# Patient Record
Sex: Male | Born: 1950
Health system: Southern US, Community
[De-identification: ages and names within clinical notes are randomized; demographics above are authoritative.]

## PROBLEM LIST (undated history)

## (undated) DIAGNOSIS — Z87442 Personal history of urinary calculi: Secondary | ICD-10-CM

## (undated) DIAGNOSIS — I219 Acute myocardial infarction, unspecified: Secondary | ICD-10-CM

## (undated) DIAGNOSIS — N400 Enlarged prostate without lower urinary tract symptoms: Secondary | ICD-10-CM

## (undated) DIAGNOSIS — I2510 Atherosclerotic heart disease of native coronary artery without angina pectoris: Secondary | ICD-10-CM

## (undated) DIAGNOSIS — I10 Essential (primary) hypertension: Secondary | ICD-10-CM

## (undated) DIAGNOSIS — M1990 Unspecified osteoarthritis, unspecified site: Secondary | ICD-10-CM

## (undated) DIAGNOSIS — I6529 Occlusion and stenosis of unspecified carotid artery: Secondary | ICD-10-CM

## (undated) DIAGNOSIS — J302 Other seasonal allergic rhinitis: Secondary | ICD-10-CM

## (undated) DIAGNOSIS — H3530 Unspecified macular degeneration: Secondary | ICD-10-CM

## (undated) DIAGNOSIS — K219 Gastro-esophageal reflux disease without esophagitis: Secondary | ICD-10-CM

## (undated) DIAGNOSIS — R0600 Dyspnea, unspecified: Secondary | ICD-10-CM

## (undated) DIAGNOSIS — I209 Angina pectoris, unspecified: Secondary | ICD-10-CM

## (undated) DIAGNOSIS — N200 Calculus of kidney: Secondary | ICD-10-CM

## (undated) HISTORY — PX: COLONOSCOPY: SHX174

## (undated) HISTORY — PX: CATARACT EXTRACTION W/ INTRAOCULAR LENS  IMPLANT, BILATERAL: SHX1307

## (undated) HISTORY — PX: BACK SURGERY: SHX140

## (undated) HISTORY — PX: CARDIAC SURGERY: SHX584

## (undated) HISTORY — PX: LUMBAR DISC SURGERY: SHX700

## (undated) HISTORY — PX: EYE SURGERY: SHX253

## (undated) HISTORY — PX: HAND SURGERY: SHX662

## (undated) HISTORY — DX: Occlusion and stenosis of unspecified carotid artery: I65.29

## (undated) HISTORY — PX: APPENDECTOMY: SHX54

## (undated) HISTORY — PX: TONSILLECTOMY: SUR1361

---

## 2002-07-01 ENCOUNTER — Ambulatory Visit (HOSPITAL_COMMUNITY): Admission: RE | Admit: 2002-07-01 | Discharge: 2002-07-01 | Payer: Self-pay | Admitting: Neurosurgery

## 2002-07-01 ENCOUNTER — Encounter: Payer: Self-pay | Admitting: Neurosurgery

## 2002-07-29 ENCOUNTER — Inpatient Hospital Stay (HOSPITAL_COMMUNITY): Admission: RE | Admit: 2002-07-29 | Discharge: 2002-07-31 | Payer: Self-pay | Admitting: Neurosurgery

## 2002-07-29 ENCOUNTER — Encounter: Payer: Self-pay | Admitting: Neurosurgery

## 2014-03-13 ENCOUNTER — Encounter (INDEPENDENT_AMBULATORY_CARE_PROVIDER_SITE_OTHER): Payer: PRIVATE HEALTH INSURANCE | Admitting: Ophthalmology

## 2014-03-13 DIAGNOSIS — H35379 Puckering of macula, unspecified eye: Secondary | ICD-10-CM

## 2014-03-13 DIAGNOSIS — H35359 Cystoid macular degeneration, unspecified eye: Secondary | ICD-10-CM

## 2014-03-13 DIAGNOSIS — H3530 Unspecified macular degeneration: Secondary | ICD-10-CM

## 2014-03-13 DIAGNOSIS — H2700 Aphakia, unspecified eye: Secondary | ICD-10-CM

## 2014-03-13 DIAGNOSIS — H43819 Vitreous degeneration, unspecified eye: Secondary | ICD-10-CM

## 2014-04-24 ENCOUNTER — Encounter (INDEPENDENT_AMBULATORY_CARE_PROVIDER_SITE_OTHER): Payer: PRIVATE HEALTH INSURANCE | Admitting: Ophthalmology

## 2014-04-24 DIAGNOSIS — H35379 Puckering of macula, unspecified eye: Secondary | ICD-10-CM

## 2014-04-24 DIAGNOSIS — H35359 Cystoid macular degeneration, unspecified eye: Secondary | ICD-10-CM

## 2014-04-24 DIAGNOSIS — H3530 Unspecified macular degeneration: Secondary | ICD-10-CM

## 2014-04-24 DIAGNOSIS — H43819 Vitreous degeneration, unspecified eye: Secondary | ICD-10-CM

## 2014-06-26 ENCOUNTER — Encounter (INDEPENDENT_AMBULATORY_CARE_PROVIDER_SITE_OTHER): Payer: PRIVATE HEALTH INSURANCE | Admitting: Ophthalmology

## 2014-06-26 DIAGNOSIS — H35379 Puckering of macula, unspecified eye: Secondary | ICD-10-CM

## 2014-06-26 DIAGNOSIS — H3530 Unspecified macular degeneration: Secondary | ICD-10-CM

## 2014-06-26 DIAGNOSIS — H35359 Cystoid macular degeneration, unspecified eye: Secondary | ICD-10-CM

## 2014-06-26 DIAGNOSIS — H43819 Vitreous degeneration, unspecified eye: Secondary | ICD-10-CM

## 2016-02-03 ENCOUNTER — Encounter (INDEPENDENT_AMBULATORY_CARE_PROVIDER_SITE_OTHER): Payer: Self-pay | Admitting: Ophthalmology

## 2016-02-04 ENCOUNTER — Encounter (INDEPENDENT_AMBULATORY_CARE_PROVIDER_SITE_OTHER): Payer: PRIVATE HEALTH INSURANCE | Admitting: Ophthalmology

## 2016-02-04 ENCOUNTER — Encounter (HOSPITAL_COMMUNITY): Payer: Self-pay | Admitting: *Deleted

## 2016-02-04 DIAGNOSIS — H43812 Vitreous degeneration, left eye: Secondary | ICD-10-CM | POA: Diagnosis not present

## 2016-02-04 DIAGNOSIS — H338 Other retinal detachments: Secondary | ICD-10-CM

## 2016-02-04 DIAGNOSIS — H353122 Nonexudative age-related macular degeneration, left eye, intermediate dry stage: Secondary | ICD-10-CM

## 2016-02-04 NOTE — H&P (Signed)
Scott Jacobson is an 65 y.o. male.   Chief Complaint:Sudden loss of vision two days ago right eye HPI: noted rapid progressive vision loss two days ago right eye  No past medical history on file.  No past surgical history on file.  No family history on file. Social History:  has no tobacco, alcohol, and drug history on file.  Allergies: Allergies not on file  No prescriptions prior to admission    Review of systems otherwise negative  There were no vitals taken for this visit.  Physical exam: Mental status: oriented x3. Eyes: See eye exam associated with this date of surgery in media tab.  Scanned in by scanning center Ears, Nose, Throat: within normal limits Neck: Within Normal limits General: within normal limits Chest: Within normal limits Breast: deferred Heart: Within normal limits Abdomen: Within normal limits GU: deferred Extremities: within normal limits Skin: within normal limits  Assessment/Plan Rhegmatogenous Retinal Detachment right eye Plan: To [facility] for Scleral buckle,  Gas injection, laser, possible pars plana vitrectomy right eye  Scott Jacobson, Scott Jacobson 02/04/2016, 11:15 AM

## 2016-02-05 ENCOUNTER — Encounter (HOSPITAL_COMMUNITY): Payer: Self-pay | Admitting: *Deleted

## 2016-02-05 ENCOUNTER — Ambulatory Visit (HOSPITAL_COMMUNITY)
Admission: RE | Admit: 2016-02-05 | Discharge: 2016-02-06 | Disposition: A | Discharge status: Home / Self Care | Payer: PRIVATE HEALTH INSURANCE | Source: Ambulatory Visit | Attending: Ophthalmology | Admitting: Ophthalmology

## 2016-02-05 ENCOUNTER — Ambulatory Visit (HOSPITAL_COMMUNITY): Payer: PRIVATE HEALTH INSURANCE | Admitting: Certified Registered Nurse Anesthetist

## 2016-02-05 ENCOUNTER — Encounter (HOSPITAL_COMMUNITY)
Admission: RE | Disposition: A | Discharge status: Home / Self Care | Payer: Self-pay | Source: Ambulatory Visit | Attending: Ophthalmology

## 2016-02-05 DIAGNOSIS — N400 Enlarged prostate without lower urinary tract symptoms: Secondary | ICD-10-CM | POA: Diagnosis present

## 2016-02-05 DIAGNOSIS — R079 Chest pain, unspecified: Secondary | ICD-10-CM | POA: Diagnosis present

## 2016-02-05 DIAGNOSIS — H33001 Unspecified retinal detachment with retinal break, right eye: Secondary | ICD-10-CM | POA: Diagnosis present

## 2016-02-05 DIAGNOSIS — R112 Nausea with vomiting, unspecified: Secondary | ICD-10-CM | POA: Diagnosis not present

## 2016-02-05 DIAGNOSIS — H338 Other retinal detachments: Secondary | ICD-10-CM | POA: Diagnosis not present

## 2016-02-05 DIAGNOSIS — Z87891 Personal history of nicotine dependence: Secondary | ICD-10-CM | POA: Diagnosis not present

## 2016-02-05 DIAGNOSIS — H3530 Unspecified macular degeneration: Secondary | ICD-10-CM | POA: Diagnosis present

## 2016-02-05 HISTORY — DX: Unspecified osteoarthritis, unspecified site: M19.90

## 2016-02-05 HISTORY — PX: SCLERAL BUCKLE: SHX5340

## 2016-02-05 HISTORY — DX: Calculus of kidney: N20.0

## 2016-02-05 HISTORY — DX: Benign prostatic hyperplasia without lower urinary tract symptoms: N40.0

## 2016-02-05 HISTORY — DX: Gastro-esophageal reflux disease without esophagitis: K21.9

## 2016-02-05 HISTORY — DX: Other seasonal allergic rhinitis: J30.2

## 2016-02-05 HISTORY — PX: LASER PHOTO ABLATION: SHX5942

## 2016-02-05 HISTORY — PX: RETINAL DETACHMENT REPAIR W/ SCLERAL BUCKLE LE: SHX2338

## 2016-02-05 HISTORY — PX: GAS INSERTION: SHX5336

## 2016-02-05 HISTORY — DX: Unspecified macular degeneration: H35.30

## 2016-02-05 LAB — CBC
HCT: 43.2 % (ref 39.0–52.0)
Hemoglobin: 14.2 g/dL (ref 13.0–17.0)
MCH: 29.8 pg (ref 26.0–34.0)
MCHC: 32.9 g/dL (ref 30.0–36.0)
MCV: 90.8 fL (ref 78.0–100.0)
PLATELETS: 184 10*3/uL (ref 150–400)
RBC: 4.76 MIL/uL (ref 4.22–5.81)
RDW: 13.5 % (ref 11.5–15.5)
WBC: 7.4 10*3/uL (ref 4.0–10.5)

## 2016-02-05 SURGERY — SCLERAL BUCKLE
Anesthesia: General | Site: Eye | Laterality: Right

## 2016-02-05 MED ORDER — POLYMYXIN B SULFATE 500000 UNITS IJ SOLR
INTRAMUSCULAR | Status: AC
  Filled 2016-02-05: qty 1

## 2016-02-05 MED ORDER — HYALURONIDASE HUMAN 150 UNIT/ML IJ SOLN
INTRAMUSCULAR | Status: AC
  Filled 2016-02-05: qty 1

## 2016-02-05 MED ORDER — SUGAMMADEX SODIUM 200 MG/2ML IV SOLN
INTRAVENOUS | Status: DC | PRN
  Administered 2016-02-05: 160.6 mg via INTRAVENOUS

## 2016-02-05 MED ORDER — BACITRACIN-POLYMYXIN B 500-10000 UNIT/GM OP OINT
TOPICAL_OINTMENT | OPHTHALMIC | Status: DC | PRN
  Administered 2016-02-05: 1 via OPHTHALMIC

## 2016-02-05 MED ORDER — BSS IO SOLN
INTRAOCULAR | Status: AC
  Filled 2016-02-05: qty 15

## 2016-02-05 MED ORDER — LATANOPROST 0.005 % OP SOLN
1.0000 [drp] | Freq: Every day | OPHTHALMIC | Status: DC
Start: 2016-02-06 — End: 2016-02-07
  Filled 2016-02-05: qty 2.5

## 2016-02-05 MED ORDER — BSS IO SOLN
INTRAOCULAR | Status: DC | PRN
  Administered 2016-02-05: 15 mL

## 2016-02-05 MED ORDER — MIDAZOLAM HCL 5 MG/5ML IJ SOLN
INTRAMUSCULAR | Status: DC | PRN
  Administered 2016-02-05: 2 mg via INTRAVENOUS

## 2016-02-05 MED ORDER — LIDOCAINE HCL (CARDIAC) 20 MG/ML IV SOLN
INTRAVENOUS | Status: DC | PRN
Start: 2016-02-05 — End: 2016-02-05
  Administered 2016-02-05: 60 mg via INTRAVENOUS
  Administered 2016-02-05: 60 mg via INTRATRACHEAL

## 2016-02-05 MED ORDER — ONDANSETRON HCL 4 MG/2ML IJ SOLN
4.0000 mg | Freq: Four times a day (QID) | INTRAMUSCULAR | Status: DC | PRN
  Administered 2016-02-06 (×2): 4 mg via INTRAVENOUS
  Filled 2016-02-05 (×3): qty 2

## 2016-02-05 MED ORDER — ROCURONIUM BROMIDE 50 MG/5ML IV SOLN
INTRAVENOUS | Status: AC
  Filled 2016-02-05: qty 1

## 2016-02-05 MED ORDER — DEXAMETHASONE SODIUM PHOSPHATE 10 MG/ML IJ SOLN
INTRAMUSCULAR | Status: AC
  Filled 2016-02-05: qty 1

## 2016-02-05 MED ORDER — GELATIN ABSORBABLE 12-7 MM EX MISC
CUTANEOUS | Status: DC | PRN
  Administered 2016-02-05: 1

## 2016-02-05 MED ORDER — BACITRACIN-POLYMYXIN B 500-10000 UNIT/GM OP OINT
1.0000 "application " | TOPICAL_OINTMENT | Freq: Three times a day (TID) | OPHTHALMIC | Status: DC
  Administered 2016-02-06 (×2): 1 via OPHTHALMIC
  Filled 2016-02-05: qty 3.5

## 2016-02-05 MED ORDER — BUPIVACAINE HCL (PF) 0.75 % IJ SOLN
INTRAMUSCULAR | Status: DC | PRN
  Administered 2016-02-05: 10 mL

## 2016-02-05 MED ORDER — TEMAZEPAM 15 MG PO CAPS: 15.0000 mg | ORAL_CAPSULE | Freq: Every evening | ORAL | Status: DC | PRN

## 2016-02-05 MED ORDER — TROPICAMIDE 1 % OP SOLN
1.0000 [drp] | OPHTHALMIC | Status: AC | PRN
  Administered 2016-02-05 (×3): 1 [drp] via OPHTHALMIC
  Filled 2016-02-05: qty 3

## 2016-02-05 MED ORDER — FENTANYL CITRATE (PF) 100 MCG/2ML IJ SOLN
INTRAMUSCULAR | Status: AC
  Filled 2016-02-05: qty 2

## 2016-02-05 MED ORDER — TETRACAINE HCL 0.5 % OP SOLN
2.0000 [drp] | Freq: Once | OPHTHALMIC | Status: DC
  Filled 2016-02-05: qty 2

## 2016-02-05 MED ORDER — ADULT MULTIVITAMIN W/MINERALS CH
ORAL_TABLET | Freq: Every day | ORAL | Status: DC
  Administered 2016-02-05: 1 via ORAL
  Filled 2016-02-05: qty 1

## 2016-02-05 MED ORDER — STERILE WATER FOR INJECTION IJ SOLN
INTRAMUSCULAR | Status: DC | PRN
  Administered 2016-02-05: 20 mL

## 2016-02-05 MED ORDER — ONDANSETRON HCL 4 MG/2ML IJ SOLN
INTRAMUSCULAR | Status: DC | PRN
  Administered 2016-02-05: 4 mg via INTRAVENOUS

## 2016-02-05 MED ORDER — ARTIFICIAL TEARS OP OINT
TOPICAL_OINTMENT | OPHTHALMIC | Status: AC
  Filled 2016-02-05: qty 3.5

## 2016-02-05 MED ORDER — HYPROMELLOSE (GONIOSCOPIC) 2.5 % OP SOLN
OPHTHALMIC | Status: AC
  Filled 2016-02-05: qty 15

## 2016-02-05 MED ORDER — ONDANSETRON HCL 4 MG/2ML IJ SOLN
INTRAMUSCULAR | Status: AC
  Filled 2016-02-05: qty 2

## 2016-02-05 MED ORDER — MIDAZOLAM HCL 2 MG/2ML IJ SOLN
INTRAMUSCULAR | Status: AC
  Filled 2016-02-05: qty 2

## 2016-02-05 MED ORDER — PHENYLEPHRINE HCL 10 MG/ML IJ SOLN
INTRAMUSCULAR | Status: DC | PRN
  Administered 2016-02-05 (×4): 80 ug via INTRAVENOUS

## 2016-02-05 MED ORDER — EPINEPHRINE HCL 1 MG/ML IJ SOLN
INTRAMUSCULAR | Status: AC
  Filled 2016-02-05: qty 1

## 2016-02-05 MED ORDER — GATIFLOXACIN 0.5 % OP SOLN
1.0000 [drp] | Freq: Four times a day (QID) | OPHTHALMIC | Status: DC
  Administered 2016-02-06 (×3): 1 [drp] via OPHTHALMIC
  Filled 2016-02-05: qty 2.5

## 2016-02-05 MED ORDER — ROCURONIUM BROMIDE 100 MG/10ML IV SOLN
INTRAVENOUS | Status: DC | PRN
  Administered 2016-02-05: 50 mg via INTRAVENOUS

## 2016-02-05 MED ORDER — DEXAMETHASONE SODIUM PHOSPHATE 10 MG/ML IJ SOLN
INTRAMUSCULAR | Status: DC | PRN
  Administered 2016-02-05: 10 mg

## 2016-02-05 MED ORDER — FENTANYL CITRATE (PF) 250 MCG/5ML IJ SOLN
INTRAMUSCULAR | Status: AC
  Filled 2016-02-05: qty 5

## 2016-02-05 MED ORDER — HYDROCODONE-ACETAMINOPHEN 5-325 MG PO TABS
1.0000 | ORAL_TABLET | ORAL | Status: DC | PRN
  Administered 2016-02-05: 2 via ORAL
  Filled 2016-02-05: qty 2

## 2016-02-05 MED ORDER — BUPIVACAINE-EPINEPHRINE (PF) 0.25% -1:200000 IJ SOLN
INTRAMUSCULAR | Status: AC
  Filled 2016-02-05: qty 30

## 2016-02-05 MED ORDER — ATROPINE SULFATE 1 % OP SOLN
OPHTHALMIC | Status: DC | PRN
  Administered 2016-02-05: 1 [drp] via OPHTHALMIC

## 2016-02-05 MED ORDER — FENTANYL CITRATE (PF) 100 MCG/2ML IJ SOLN
INTRAMUSCULAR | Status: DC | PRN
  Administered 2016-02-05: 50 ug via INTRAVENOUS
  Administered 2016-02-05: 100 ug via INTRAVENOUS
  Administered 2016-02-05: 50 ug via INTRAVENOUS
  Administered 2016-02-05: 100 ug via INTRAVENOUS

## 2016-02-05 MED ORDER — TRIAMCINOLONE ACETONIDE 40 MG/ML IJ SUSP
INTRAMUSCULAR | Status: AC
  Filled 2016-02-05: qty 5

## 2016-02-05 MED ORDER — MORPHINE SULFATE (PF) 2 MG/ML IV SOLN
INTRAVENOUS | Status: AC
  Filled 2016-02-05: qty 1

## 2016-02-05 MED ORDER — CEFAZOLIN SODIUM-DEXTROSE 2-4 GM/100ML-% IV SOLN
2.0000 g | INTRAVENOUS | Status: AC
  Administered 2016-02-05: 2 g via INTRAVENOUS
  Filled 2016-02-05: qty 100

## 2016-02-05 MED ORDER — CENTRUM SILVER PO TABS: ORAL_TABLET | Freq: Every day | ORAL | Status: DC

## 2016-02-05 MED ORDER — LIDOCAINE 2% (20 MG/ML) 5 ML SYRINGE
INTRAMUSCULAR | Status: AC
  Filled 2016-02-05: qty 25

## 2016-02-05 MED ORDER — BACITRACIN-POLYMYXIN B 500-10000 UNIT/GM OP OINT
TOPICAL_OINTMENT | OPHTHALMIC | Status: AC
  Filled 2016-02-05: qty 3.5

## 2016-02-05 MED ORDER — MAGNESIUM HYDROXIDE 400 MG/5ML PO SUSP
15.0000 mL | Freq: Four times a day (QID) | ORAL | Status: DC | PRN
  Administered 2016-02-06: 30 mL via ORAL
  Filled 2016-02-05: qty 30

## 2016-02-05 MED ORDER — SODIUM CHLORIDE 0.45 % IV SOLN
INTRAVENOUS | Status: DC
  Administered 2016-02-05: 21:00:00 via INTRAVENOUS

## 2016-02-05 MED ORDER — PROPOFOL 10 MG/ML IV BOLUS
INTRAVENOUS | Status: DC | PRN
  Administered 2016-02-05: 200 mg via INTRAVENOUS

## 2016-02-05 MED ORDER — STERILE WATER FOR INJECTION IJ SOLN
INTRAMUSCULAR | Status: AC
  Filled 2016-02-05: qty 20

## 2016-02-05 MED ORDER — SODIUM HYALURONATE 10 MG/ML IO SOLN
INTRAOCULAR | Status: AC
  Filled 2016-02-05: qty 0.85

## 2016-02-05 MED ORDER — PHENYLEPHRINE HCL 2.5 % OP SOLN
1.0000 [drp] | OPHTHALMIC | Status: AC | PRN
  Administered 2016-02-05 (×3): 1 [drp] via OPHTHALMIC
  Filled 2016-02-05: qty 2

## 2016-02-05 MED ORDER — OXYCODONE HCL 5 MG/5ML PO SOLN: 5.0000 mg | Freq: Once | ORAL | Status: DC | PRN

## 2016-02-05 MED ORDER — PREDNISOLONE ACETATE 1 % OP SUSP
1.0000 [drp] | Freq: Four times a day (QID) | OPHTHALMIC | Status: DC
  Administered 2016-02-06 (×3): 1 [drp] via OPHTHALMIC
  Filled 2016-02-05: qty 5

## 2016-02-05 MED ORDER — MORPHINE SULFATE (PF) 2 MG/ML IV SOLN
1.0000 mg | INTRAVENOUS | Status: AC | PRN
  Administered 2016-02-05 – 2016-02-06 (×2): 2 mg via INTRAVENOUS
  Filled 2016-02-05: qty 1

## 2016-02-05 MED ORDER — LIDOCAINE HCL 2 % IJ SOLN
INTRAMUSCULAR | Status: AC
  Filled 2016-02-05: qty 20

## 2016-02-05 MED ORDER — GATIFLOXACIN 0.5 % OP SOLN
1.0000 [drp] | OPHTHALMIC | Status: AC | PRN
  Administered 2016-02-05 (×3): 1 [drp] via OPHTHALMIC
  Filled 2016-02-05: qty 2.5

## 2016-02-05 MED ORDER — ACETAMINOPHEN 325 MG PO TABS
325.0000 mg | ORAL_TABLET | ORAL | Status: DC | PRN
Start: 2016-02-05 — End: 2016-02-07

## 2016-02-05 MED ORDER — LORATADINE 10 MG PO TABS
10.0000 mg | ORAL_TABLET | Freq: Every day | ORAL | Status: DC
  Administered 2016-02-05: 10 mg via ORAL
  Filled 2016-02-05: qty 1

## 2016-02-05 MED ORDER — BSS PLUS IO SOLN
INTRAOCULAR | Status: AC
  Filled 2016-02-05: qty 500

## 2016-02-05 MED ORDER — CYCLOPENTOLATE HCL 1 % OP SOLN
1.0000 [drp] | OPHTHALMIC | Status: AC | PRN
  Administered 2016-02-05 (×3): 1 [drp] via OPHTHALMIC
  Filled 2016-02-05: qty 2

## 2016-02-05 MED ORDER — ACETAZOLAMIDE SODIUM 500 MG IJ SOLR
500.0000 mg | Freq: Once | INTRAMUSCULAR | Status: AC
  Administered 2016-02-06: 500 mg via INTRAVENOUS
  Filled 2016-02-05: qty 500

## 2016-02-05 MED ORDER — ATROPINE SULFATE 1 % OP SOLN
OPHTHALMIC | Status: AC
  Filled 2016-02-05: qty 5

## 2016-02-05 MED ORDER — METHYLPREDNISOLONE SODIUM SUCC 125 MG IJ SOLR
125.0000 mg | INTRAMUSCULAR | Status: AC
  Administered 2016-02-05: 125 mg via INTRAVENOUS
  Filled 2016-02-05: qty 2

## 2016-02-05 MED ORDER — SODIUM CHLORIDE 0.9 % IV SOLN
INTRAVENOUS | Status: DC
  Administered 2016-02-05: 14:00:00 via INTRAVENOUS

## 2016-02-05 MED ORDER — PROPOFOL 10 MG/ML IV BOLUS
INTRAVENOUS | Status: AC
  Filled 2016-02-05: qty 20

## 2016-02-05 MED ORDER — ONDANSETRON HCL 4 MG/2ML IJ SOLN: 4.0000 mg | Freq: Four times a day (QID) | INTRAMUSCULAR | Status: DC | PRN

## 2016-02-05 MED ORDER — BRIMONIDINE TARTRATE 0.2 % OP SOLN
1.0000 [drp] | Freq: Two times a day (BID) | OPHTHALMIC | Status: DC
  Filled 2016-02-05: qty 5

## 2016-02-05 MED ORDER — SODIUM CHLORIDE 0.9 % IJ SOLN
INTRAMUSCULAR | Status: AC
  Filled 2016-02-05: qty 10

## 2016-02-05 MED ORDER — FENTANYL CITRATE (PF) 100 MCG/2ML IJ SOLN
25.0000 ug | INTRAMUSCULAR | Status: DC | PRN
  Administered 2016-02-05: 25 ug via INTRAVENOUS

## 2016-02-05 MED ORDER — TAMSULOSIN HCL 0.4 MG PO CAPS
0.4000 mg | ORAL_CAPSULE | Freq: Every day | ORAL | Status: DC
  Administered 2016-02-05: 0.4 mg via ORAL
  Filled 2016-02-05: qty 1

## 2016-02-05 MED ORDER — BUPIVACAINE HCL (PF) 0.75 % IJ SOLN
INTRAMUSCULAR | Status: AC
  Filled 2016-02-05: qty 10

## 2016-02-05 MED ORDER — ACETAZOLAMIDE SODIUM 500 MG IJ SOLR
INTRAMUSCULAR | Status: AC
  Filled 2016-02-05: qty 500

## 2016-02-05 MED ORDER — LACTATED RINGERS IV SOLN
INTRAVENOUS | Status: DC | PRN
  Administered 2016-02-05: 16:00:00 via INTRAVENOUS

## 2016-02-05 MED ORDER — CEFTAZIDIME 1 G IJ SOLR
INTRAMUSCULAR | Status: AC
  Filled 2016-02-05: qty 1

## 2016-02-05 MED ORDER — SUGAMMADEX SODIUM 200 MG/2ML IV SOLN
INTRAVENOUS | Status: AC
  Filled 2016-02-05: qty 2

## 2016-02-05 MED ORDER — PHENYLEPHRINE HCL 10 % OP SOLN
1.0000 [drp] | Freq: Once | OPHTHALMIC | Status: AC
  Administered 2016-02-05: 1 [drp] via OPHTHALMIC
  Filled 2016-02-05: qty 5

## 2016-02-05 MED ORDER — OXYCODONE HCL 5 MG PO TABS: 5.0000 mg | ORAL_TABLET | Freq: Once | ORAL | Status: DC | PRN

## 2016-02-05 SURGICAL SUPPLY — 72 items
APPLICATOR DR MATTHEWS STRL (MISCELLANEOUS) ×24 IMPLANT
BLADE EYE CATARACT 19 1.4 BEAV (BLADE) IMPLANT
BLADE MVR KNIFE 19G (BLADE) IMPLANT
CANNULA ANT CHAM MAIN (OPHTHALMIC RELATED) IMPLANT
CANNULA DUAL BORE 23G (CANNULA) IMPLANT
CORDS BIPOLAR (ELECTRODE) IMPLANT
COTTONBALL LRG STERILE PKG (GAUZE/BANDAGES/DRESSINGS) ×9 IMPLANT
COVER MAYO STAND STRL (DRAPES) IMPLANT
COVER SURGICAL LIGHT HANDLE (MISCELLANEOUS) ×3 IMPLANT
DRAPE INCISE 51X51 W/FILM STRL (DRAPES) ×3 IMPLANT
DRAPE OPHTHALMIC 77X100 STRL (CUSTOM PROCEDURE TRAY) ×3 IMPLANT
ERASER HMR WETFIELD 23G BP (MISCELLANEOUS) IMPLANT
FILTER BLUE MILLIPORE (MISCELLANEOUS) ×6 IMPLANT
FILTER STRAW FLUID ASPIR (MISCELLANEOUS) IMPLANT
FORCEPS GRIESHABER ILM 25G A (INSTRUMENTS) IMPLANT
GAS AUTO FILL CONSTEL (OPHTHALMIC)
GAS AUTO FILL CONSTELLATION (OPHTHALMIC) IMPLANT
GAS OPHTHALMIC (MISCELLANEOUS) ×3 IMPLANT
GLOVE SS BIOGEL STRL SZ 6.5 (GLOVE) ×1 IMPLANT
GLOVE SS BIOGEL STRL SZ 7 (GLOVE) ×1 IMPLANT
GLOVE SUPERSENSE BIOGEL SZ 6.5 (GLOVE) ×2
GLOVE SUPERSENSE BIOGEL SZ 7 (GLOVE) ×2
GLOVE SURG 8.5 LATEX PF (GLOVE) ×3 IMPLANT
GOWN STRL REUS W/ TWL LRG LVL3 (GOWN DISPOSABLE) ×2 IMPLANT
GOWN STRL REUS W/TWL LRG LVL3 (GOWN DISPOSABLE) ×4
HANDLE PNEUMATIC FOR CONSTEL (OPHTHALMIC) IMPLANT
IMPL SILICONE (Ophthalmic Related) ×1 IMPLANT
IMPLANT SILICONE (Ophthalmic Related) ×5 IMPLANT
KIT BASIN OR (CUSTOM PROCEDURE TRAY) ×3 IMPLANT
KIT PERFLUORON PROCEDURE 5ML (MISCELLANEOUS) IMPLANT
KIT ROOM TURNOVER OR (KITS) ×3 IMPLANT
KNIFE CRESCENT 1.75 EDGEAHEAD (BLADE) IMPLANT
KNIFE GRIESHABER SHARP 2.5MM (MISCELLANEOUS) ×9 IMPLANT
MICROPICK 25G (MISCELLANEOUS)
NEEDLE 18GX1X1/2 (RX/OR ONLY) (NEEDLE) ×3 IMPLANT
NEEDLE 25GX 5/8IN NON SAFETY (NEEDLE) IMPLANT
NEEDLE 27GAX1X1/2 (NEEDLE) IMPLANT
NEEDLE HYPO 30X.5 LL (NEEDLE) IMPLANT
NS IRRIG 1000ML POUR BTL (IV SOLUTION) ×3 IMPLANT
PACK VITRECTOMY CUSTOM (CUSTOM PROCEDURE TRAY) ×3 IMPLANT
PAD ARMBOARD 7.5X6 YLW CONV (MISCELLANEOUS) ×6 IMPLANT
PAK PIK VITRECTOMY CVS 25GA (OPHTHALMIC) IMPLANT
PIC ILLUMINATED 25G (OPHTHALMIC)
PICK MICROPICK 25G (MISCELLANEOUS) IMPLANT
PIK ILLUMINATED 25G (OPHTHALMIC) IMPLANT
PROBE LASER ILLUM FLEX CVD 25G (OPHTHALMIC) IMPLANT
REPL STRA BRUSH NEEDLE (NEEDLE) IMPLANT
RESERVOIR BACK FLUSH (MISCELLANEOUS) IMPLANT
ROLLS DENTAL (MISCELLANEOUS) ×6 IMPLANT
SLEEVE SCLERAL TYPE 270 (Ophthalmic Related) ×3 IMPLANT
SPEAR EYE SURG WECK-CEL (MISCELLANEOUS) ×15 IMPLANT
SPONGE SURGIFOAM ABS GEL 12-7 (HEMOSTASIS) ×3 IMPLANT
STOPCOCK 4 WAY LG BORE MALE ST (IV SETS) IMPLANT
SUT CHROMIC 7 0 TG140 8 (SUTURE) ×3 IMPLANT
SUT ETHILON 9 0 TG140 8 (SUTURE) IMPLANT
SUT MERSILENE 4 0 RV 2 (SUTURE) ×6 IMPLANT
SUT SILK 2 0 (SUTURE) ×2
SUT SILK 2-0 18XBRD TIE 12 (SUTURE) ×1 IMPLANT
SUT SILK 4 0 RB 1 (SUTURE) ×3 IMPLANT
SYR 20CC LL (SYRINGE) ×6 IMPLANT
SYR 50ML LL SCALE MARK (SYRINGE) IMPLANT
SYR 5ML LL (SYRINGE) ×3 IMPLANT
SYR BULB 3OZ (MISCELLANEOUS) ×3 IMPLANT
SYR TB 1ML LUER SLIP (SYRINGE) IMPLANT
SYRINGE 10CC LL (SYRINGE) ×3 IMPLANT
TAPE SURG TRANSPORE 1 IN (GAUZE/BANDAGES/DRESSINGS) ×1 IMPLANT
TAPE SURGICAL TRANSPORE 1 IN (GAUZE/BANDAGES/DRESSINGS) ×2
TIRE RTNL 2.5XGRV CNCV 9X (Ophthalmic Related) ×1 IMPLANT
TOWEL OR 17X24 6PK STRL BLUE (TOWEL DISPOSABLE) ×3 IMPLANT
TUBING HIGH PRESS EXTEN 6IN (TUBING) IMPLANT
WATER STERILE IRR 1000ML POUR (IV SOLUTION) ×3 IMPLANT
WIPE INSTRUMENT VISIWIPE 73X73 (MISCELLANEOUS) ×3 IMPLANT

## 2016-02-05 NOTE — Transfer of Care (Signed)
Immediate Anesthesia Transfer of Care Note  Patient: Scott Jacobson  Procedure(s) Performed: Procedure(s): SCLERAL BUCKLE (Right) INSERTION OF GAS-C3F8 (Right) LASER PHOTO ABLATION-HEADSCOPE LASER (Right)  Patient Location: PACU  Anesthesia Type:General  Level of Consciousness: awake, alert  and oriented  Airway & Oxygen Therapy: Patient Spontanous Breathing and Patient connected to nasal cannula oxygen  Post-op Assessment: Report given to RN, Post -op Vital signs reviewed and stable and Patient moving all extremities  Post vital signs: Reviewed and stable  Last Vitals:  Filed Vitals:   02/05/16 0918  BP: 172/85  Pulse: 70  Temp: 36.8 C  Resp: 20    Last Pain: There were no vitals filed for this visit.    Patients Stated Pain Goal: 4 ([id number])  Complications: No apparent anesthesia complications

## 2016-02-05 NOTE — Anesthesia Preprocedure Evaluation (Signed)
Anesthesia Evaluation  Patient identified by MRN, date of birth, ID band Patient awake    Reviewed: Allergy & Precautions, NPO status , Patient's Chart, lab work & pertinent test results  Airway Mallampati: II   Neck ROM: full    Dental   Pulmonary former smoker,    breath sounds clear to auscultation       Cardiovascular negative cardio ROS   Rhythm:regular Rate:Normal     Neuro/Psych    GI/Hepatic GERD  ,  Endo/Other    Renal/GU stones     Musculoskeletal   Abdominal   Peds  Hematology   Anesthesia Other Findings   Reproductive/Obstetrics                             Anesthesia Physical Anesthesia Plan  ASA: II  Anesthesia Plan: General   Post-op Pain Management:    Induction: Intravenous  Airway Management Planned: Oral ETT  Additional Equipment:   Intra-op Plan:   Post-operative Plan: Extubation in OR  Informed Consent: I have reviewed the patients History and Physical, chart, labs and discussed the procedure including the risks, benefits and alternatives for the proposed anesthesia with the patient or authorized representative who has indicated his/her understanding and acceptance.     Plan Discussed with: CRNA, Anesthesiologist and Surgeon  Anesthesia Plan Comments:         Anesthesia Quick Evaluation

## 2016-02-05 NOTE — Anesthesia Postprocedure Evaluation (Signed)
Anesthesia Post Note  Patient: Scott Jacobson  Procedure(s) Performed: Procedure(s) (LRB): SCLERAL BUCKLE (Right) INSERTION OF GAS-C3F8 (Right) LASER PHOTO ABLATION-HEADSCOPE LASER (Right)  Patient location during evaluation: Other Anesthesia Type: General Level of consciousness: awake and alert Pain management: pain level controlled Vital Signs Assessment: post-procedure vital signs reviewed and stable Respiratory status: spontaneous breathing, nonlabored ventilation and respiratory function stable Cardiovascular status: blood pressure returned to baseline and stable Postop Assessment: no signs of nausea or vomiting Anesthetic complications: no    Last Vitals:  Filed Vitals:   02/05/16 1902 02/05/16 2136  BP: 163/75 174/73  Pulse: 76 76  Temp: 36.8 C 36.7 C  Resp: 16 16    Last Pain:  Filed Vitals:   02/05/16 2202  PainSc: 6                  Marili Vader A

## 2016-02-05 NOTE — Anesthesia Procedure Notes (Signed)
Procedure Name: Intubation Date/Time: 02/05/2016 3:41 PM Performed by: [place] Pink Pre-anesthesia Checklist: Patient identified, Emergency Drugs available, Suction available, Patient being monitored and Timeout performed Patient Re-evaluated:Patient Re-evaluated prior to inductionOxygen Delivery Method: Circle system utilized Preoxygenation: Pre-oxygenation with 100% oxygen Intubation Type: IV induction Ventilation: Mask ventilation without difficulty and Oral airway inserted - appropriate to patient size Laryngoscope Size: Mac and 4 Grade View: Grade II Tube type: Oral Tube size: 7.5 mm Number of attempts: 1 Airway Equipment and Method: Stylet and LTA kit utilized Placement Confirmation: ETT inserted through vocal cords under direct vision,  positive ETCO2 and breath sounds checked- equal and bilateral Secured at: 21 cm Tube secured with: Tape Dental Injury: Teeth and Oropharynx as per pre-operative assessment

## 2016-02-05 NOTE — Brief Op Note (Signed)
02/05/2016  5:57 PM  PATIENT:  Scott Jacobson  65 y.o. male  PRE-OPERATIVE DIAGNOSIS:  retinal detachment right eye  POST-OPERATIVE DIAGNOSIS:  Retinal detachment Right Eye  PROCEDURE:  Procedure(s): SCLERAL BUCKLE (Right) INSERTION OF GAS-C3F8 (Right) LASER PHOTO ABLATION-HEADSCOPE LASER (Right)  SURGEON:  Surgeon(s) and Role:    * Hayden Pedro, MD - Primary    Surgeon:  Hayden Pedro, MD...  Assistant:  Deatra Ina SA    Anesthesia: General  Specimen: none  Estimated blood loss:  1cc  Complications: none  Patient sent to PACU in good condition  Composed by Hayden Pedro MD  Dictation number: [id number]

## 2016-02-05 NOTE — H&P (Signed)
I examined the patient today and there is no change in the medical status 

## 2016-02-06 ENCOUNTER — Encounter (HOSPITAL_COMMUNITY): Payer: Self-pay | Admitting: General Practice

## 2016-02-06 DIAGNOSIS — N400 Enlarged prostate without lower urinary tract symptoms: Secondary | ICD-10-CM | POA: Diagnosis present

## 2016-02-06 DIAGNOSIS — H3530 Unspecified macular degeneration: Secondary | ICD-10-CM | POA: Diagnosis present

## 2016-02-06 DIAGNOSIS — R112 Nausea with vomiting, unspecified: Secondary | ICD-10-CM

## 2016-02-06 DIAGNOSIS — H33001 Unspecified retinal detachment with retinal break, right eye: Secondary | ICD-10-CM | POA: Diagnosis not present

## 2016-02-06 DIAGNOSIS — R079 Chest pain, unspecified: Secondary | ICD-10-CM | POA: Diagnosis not present

## 2016-02-06 HISTORY — DX: Nausea with vomiting, unspecified: R11.2

## 2016-02-06 LAB — TROPONIN I: Troponin I: <0.03 ng/mL (ref <0.031)

## 2016-02-06 MED ORDER — ONDANSETRON HCL 4 MG/2ML IJ SOLN
4.0000 mg | INTRAMUSCULAR | Status: DC
  Administered 2016-02-06: 4 mg via INTRAVENOUS
  Filled 2016-02-06: qty 2

## 2016-02-06 MED ORDER — SODIUM CHLORIDE 0.45 % IV SOLN
INTRAVENOUS | Status: DC
  Administered 2016-02-06: 17:00:00 via INTRAVENOUS

## 2016-02-06 MED ORDER — PROCHLORPERAZINE EDISYLATE 5 MG/ML IJ SOLN: 10.0000 mg | INTRAMUSCULAR | Status: DC | PRN

## 2016-02-06 MED ORDER — TETRACAINE 0.5 % OP SOLN OPTIME - NO CHARGE: 1.0000 [drp] | Freq: Once | OPHTHALMIC | Status: DC

## 2016-02-06 MED ORDER — BACITRACIN-POLYMYXIN B 500-10000 UNIT/GM OP OINT: 1.0000 "application " | TOPICAL_OINTMENT | Freq: Three times a day (TID) | OPHTHALMIC | Status: DC

## 2016-02-06 MED ORDER — PREDNISOLONE ACETATE 1 % OP SUSP: 1.0000 [drp] | Freq: Four times a day (QID) | OPHTHALMIC | Status: DC

## 2016-02-06 MED ORDER — BRIMONIDINE TARTRATE 0.2 % OP SOLN: 1.0000 [drp] | Freq: Two times a day (BID) | OPHTHALMIC | Status: DC

## 2016-02-06 MED ORDER — PROMETHAZINE HCL 25 MG PO TABS: 12.5000 mg | ORAL_TABLET | ORAL | Status: DC | PRN

## 2016-02-06 MED ORDER — GI COCKTAIL ~~LOC~~
30.0000 mL | Freq: Once | ORAL | Status: AC
  Administered 2016-02-06: 30 mL via ORAL
  Filled 2016-02-06: qty 30

## 2016-02-06 MED ORDER — HYDROCODONE-ACETAMINOPHEN 5-325 MG PO TABS: 1.0000 | ORAL_TABLET | ORAL | Status: DC | PRN

## 2016-02-06 MED ORDER — LATANOPROST 0.005 % OP SOLN: 1.0000 [drp] | Freq: Every day | OPHTHALMIC | Status: DC

## 2016-02-06 MED ORDER — TETRACAINE HCL 0.5 % OP SOLN
2.0000 [drp] | Freq: Once | OPHTHALMIC | Status: AC
  Administered 2016-02-06: 2 [drp] via OPHTHALMIC
  Filled 2016-02-06: qty 2

## 2016-02-06 MED ORDER — SODIUM CHLORIDE 0.45 % IV SOLN
Freq: Once | INTRAVENOUS | Status: AC
  Administered 2016-02-06: 12:00:00 via INTRAVENOUS

## 2016-02-06 MED ORDER — GATIFLOXACIN 0.5 % OP SOLN: 1.0000 [drp] | Freq: Four times a day (QID) | OPHTHALMIC | Status: DC

## 2016-02-06 MED ORDER — ACETAZOLAMIDE SODIUM 500 MG IJ SOLR
500.0000 mg | Freq: Once | INTRAMUSCULAR | Status: AC
  Administered 2016-02-06: 500 mg via INTRAVENOUS
  Filled 2016-02-06: qty 500

## 2016-02-06 MED ORDER — GI COCKTAIL ~~LOC~~
30.0000 mL | Freq: Two times a day (BID) | ORAL | Status: DC | PRN
  Filled 2016-02-06: qty 30

## 2016-02-06 MED ORDER — PROMETHAZINE HCL 25 MG RE SUPP: 12.5000 mg | RECTAL | Status: DC | PRN

## 2016-02-06 MED ORDER — PROCHLORPERAZINE EDISYLATE 5 MG/ML IJ SOLN
10.0000 mg | INTRAMUSCULAR | Status: DC | PRN
  Filled 2016-02-06: qty 2

## 2016-02-06 MED ORDER — TIMOLOL MALEATE 0.5 % OP SOLN: 1.0000 [drp] | Freq: Two times a day (BID) | OPHTHALMIC | Status: DC

## 2016-02-06 NOTE — Progress Notes (Signed)
Discharged instructions explained to patient and wife.  Patient and wife verbalized understanding and no questions asked at this time.  Eye drops and ointment with instructions on dosages were likewise given.  Wife states, "We will be going home after breakfast to see how her husband do after eating breakfast".  Will endorse appropriately to day shift RN.

## 2016-02-06 NOTE — Consult Note (Signed)
[facility] Medical Consultation  Scott Jacobson [id number] DOB: 1951/04/07 DOA: 02/05/2016 PCP: Neale Burly, MD   Requesting physician: Tempie Hoist Date of consultation: 02/06/16 Reason for consultation: persistent nausea/vomiting/chest discomfort  Impression/Recommendations Principal Problem:   Nausea and vomiting Active Problems:   Rhegmatogenous retinal detachment of right eye   Chest pain   Macular degeneration   BPH (benign prostatic hyperplasia)  1. Persistent nausea vomiting. Etiology uncertain. May be postanesthesia related. Some concern for increased intraocular pressure per Dr Zigmund Daniel. s/p bedside procedure to decrease pressure per Dr Zigmund Daniel.  Burnis Medin keep nothing by mouth for bowel rest and advance as tolerated -We'll provide scheduled Zofran -Phenergan for breakthrough -if tolerating liquids may be discharged per Dr Zigmund Daniel note  #2. Chest pain. Doubt cardiac related.  -cycle troponin -gi cocktain -analgesia -if initial troponin negative may discharge to home   3. BPH -flomax  #4. Detachment of right eye status post surgical repair -Defer to ophthalmology  [facility] will followup again tomorrow. Please contact can be of assistance in the meanwhile. Thank you for this consultation.  Chief Complaint: nausea and vomiting/chest discomfort  HPI: Mr Scott Jacobson a very pleasant 65 year old male with past medical history of BPH, macular degeneration and recently retinal detachment of right eye status post surgical repair yesterday. this morning he developed persistent nausea vomiting associated with chest discomfort. [facility] are asked to consult.  Information is obtained from the patient.  He reports persistent nausea all last night with intermittent vomiting. He also complains chest  Pain located mid anterior chest. He describes the pain as a burning sensation that radiates to both sides of his jaw. She is symptoms include diaphoresis shortness of breath.  this morning he began with more persistent vomiting with some reported dark emesis small amounts. He denies any abdominal pain headache dizziness syncope or near-syncope. He denies any palpitations lower extremity edema dysuria hematuria frequency or urgency.  Review of Systems:  10 point review of systems complete and all systems are negative except as indicated in the history of present illness  Past Medical History  Diagnosis Date  . Seasonal allergies   . GERD (gastroesophageal reflux disease)   . Macular degeneration   . Kidney stones   . BPH (benign prostatic hyperplasia)   . Arthritis     "probably"   Past Surgical History  Procedure Laterality Date  . Cataract extraction w/ intraocular lens  implant, bilateral Bilateral   . Tonsillectomy    . Back surgery    . Appendectomy    . Hand surgery Right     thumb; S/P "cut his arm"  . Colonoscopy    . Lumbar disc surgery  ~ 2002  . Retinal detachment repair w/ scleral buckle le Right 02/05/2016  . Eye surgery     Social History:  reports that he quit smoking about 40 years ago. His smoking use included Cigarettes. He has a 36 pack-year smoking history. He has never used smokeless tobacco. He reports that he drinks alcohol. He reports that he does not use illicit drugs. His wife he is employed. 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 Independent With ADLs No Known Allergies Family History  Problem Relation Age of Onset  . Hypertension Mother   . Congestive Heart Failure Father     Prior to Admission medications   Medication Sig Start Date End Date Taking? Authorizing Provider  cetirizine (ZYRTEC) 10 MG tablet Take 10 mg by mouth 2 (two) times daily.  Yes Historical Provider, MD  Multiple Vitamins-Minerals (CENTRUM SILVER PO) Take 1 tablet by mouth daily.   Yes Historical Provider, MD  Multiple Vitamins-Minerals (ICAPS MV PO) Take 1 tablet by mouth daily.   Yes Historical Provider, MD  tamsulosin (FLOMAX) 0.4 MG CAPS  capsule Take 0.4 mg by mouth daily.   Yes Historical Provider, MD  bacitracin-polymyxin b (POLYSPORIN) ophthalmic ointment Place 1 application into the right eye 3 (three) times daily. apply to eye every 12 hours while awake 02/06/16   Hayden Pedro, MD  brimonidine [facility]) 0.2 % ophthalmic solution Place 1 drop into the right eye 2 (two) times daily. 02/06/16   Hayden Pedro, MD  gatifloxacin (ZYMAXID) 0.5 % SOLN Place 1 drop into the right eye 4 (four) times daily. 02/06/16   Hayden Pedro, MD  HYDROcodone-acetaminophen (NORCO/VICODIN) 5-325 MG tablet Take 1-2 tablets by mouth every 4 (four) hours as needed for moderate pain. 02/06/16   Hayden Pedro, MD  latanoprost (XALATAN) 0.005 % ophthalmic solution Place 1 drop into the right eye at bedtime. 02/06/16   Hayden Pedro, MD  prednisoLONE acetate (PRED FORTE) 1 % ophthalmic suspension Place 1 drop into the right eye 4 (four) times daily. 02/06/16   Hayden Pedro, MD  timolol (TIMOPTIC) 0.5 % ophthalmic solution Place 1 drop into the right eye 2 (two) times daily. 02/06/16   Hayden Pedro, MD   Physical Exam: Blood pressure 145/76, pulse 81, temperature 98.6 F (37 C), temperature source Oral, resp. rate 16, height 5\' 9"  (1.753 m), weight 80.287 kg (177 lb), SpO2 97 %. Filed Vitals:   02/06/16 1053 02/06/16 1438  BP: 128/56 145/76  Pulse: 76 81  Temp: 98.3 F (36.8 C) 98.6 F (37 C)  Resp: 16 16     General:  Lying on right side with emesis basin in bed with him. Appears uncomfortable but not toxic  Eyes: Pupils equal round reactive to light. Right eye with some swelling erythema, drainage  ENT: Clear nose without drainage oropharynx without erythema or exudate  Neck: Supple no JVD full range of motion  Cardiovascular: Sounds slightly distant but regular I hear no murmur no gallop no rub no lower extremity edema  Respiratory:  Normal Effort breath sounds clear bilaterally somewhat distant in the bases. I hear no wheezes no  crackles  Abdomen: Soft positive bowel sounds nontender to palpation  Skin: Warm and dry no rash or lesions  Musculoskeletal: Joints without swelling/erythema  Psychiatric: Calm cooperative  Neurologic: Speech clear facial symmetry all extremities oriented 3  Labs on Admission:  Basic Metabolic Panel: No results for input(s): NA, K, CL, CO2, GLUCOSE, BUN, CREATININE, CALCIUM, MG, PHOS in the last 168 hours. Liver Function Tests: No results for input(s): AST, ALT, ALKPHOS, BILITOT, PROT, ALBUMIN in the last 168 hours. No results for input(s): LIPASE, AMYLASE in the last 168 hours. No results for input(s): AMMONIA in the last 168 hours. CBC:  Recent Labs Lab 02/05/16 1044  WBC 7.4  HGB 14.2  HCT 43.2  MCV 90.8  PLT 184   Cardiac Enzymes: No results for input(s): CKTOTAL, CKMB, CKMBINDEX, TROPONINI in the last 168 hours. BNP: Invalid input(s): POCBNP CBG: No results for input(s): GLUCAP in the last 168 hours.  Radiological Exams on Admission: No results found.  EKG: Independently reviewed. Pending  Time spent: 65 minutes  [facility]   If 7PM-7AM, please contact night-coverage [email or web address] Password [email or web address] 02/06/2016, 4:40 PM

## 2016-02-06 NOTE — Progress Notes (Signed)
Pt ate dinner at 2030 and pt stated that he was feeling better and wanted to go home. Called Dr. Zigmund Daniel and informed pt is ready to be discharge. Dr. Zigmund Daniel ordered pt to be discharge verbally. Discharge summary from this morning given to pt and went over instructions with the pt and family. Discontinue to PIV. Pt discharge to home with wife.

## 2016-02-06 NOTE — Progress Notes (Signed)
Patient has been having several episodes of vomiting since change of shift this am. Complains also of indigestion. MD Zigmund Daniel notified. IV restarted and administered fluids. Zofran 4 mg IV given for nausea. Patient is still suppose to be discharged later today to see MD Zigmund Daniel in office. Will continue to monitor.

## 2016-02-06 NOTE — Op Note (Signed)
NAMEMarland Kitchen  KADAR, MERKERSON NO.:  [id number]  MEDICAL RECORD NO.:  [id number]  LOCATION:  6N07C                        FACILITY:  [facility]  PHYSICIAN:  Diony Sinibaldi. Zigmund Daniel, M.D. DATE OF BIRTH:  01-Jan-1951  DATE OF PROCEDURE:  02/05/2016 DATE OF DISCHARGE:                              OPERATIVE REPORT   ADMISSION DIAGNOSIS:  Rhegmatogenous retinal detachment, right eye.  PROCEDURE:  Scleral buckle, right eye; gas injection, right eye; retinal photocoagulation, right eye.  SURGEON:  Dougals Dolph. Zigmund Daniel, M.D.  ASSISTANT:  Deatra Ina, SA  ANESTHESIA:  General.  DETAILS:  Usual prep and drape, 360-degree limbal peritomy, isolation of 4 rectus muscles on 2-0 silk, scleral dissection for 360 degrees to admit a #279 intrascleral implant, 1 mm was trimmed from the posterior edge of the buckle.  The 279 implant was placed around the globe and 240 band was placed around the globe as well, 270 sleeve was placed at 2 o'clock.  The joint of the band and the joint of the buckle were placed at 2 o'clock.  A perforation site was chosen at 8 o'clock in the posterior aspect of the bed.  After a cutdown on the sclera and diathermy of the sclera and choroid, the subretinal space was entered with the white diathermy probe.  A large amount of clear colorless subretinal fluid came forth in a controlled manner.  The scleral flaps were closed with 4-0 Mersilene suture, 2 per quadrant for a total of 8 scleral sutures.  As the fluid egressed, the scleral flaps were closed. C3F8 50% and 1.2 mL was injected to reinflate the globe.  The scleral sutures were drawn securely, knotted, and the free ends were removed. The buckle was adjusted and trimmed.  The band was adjusted and trimmed. Indirect ophthalmoscopy showed the retina to be lying nicely in place on the scleral buckle with minimal subretinal fluid remaining.  The vitreous cavity was filled with gas.  The indirect ophthalmoscope laser was  moved into place, 831 burns were placed around the retinal periphery on the scleral buckle.  The power of 1000 mW, size 1000 microns each, and 0.1 seconds each.  The scleral sutures were again adjusted and the free ends were removed.  The conjunctiva was reposited with 7-0 chromic suture.  Polymyxin and ceftazidime were irrigated into tenon space. Decadron 10 mg was injected around the globe for postop pain.  Atropine solution was applied.  Marcaine was injected in the lower subconjunctival space.  Polysporin ophthalmic ointment, a patch and shield were placed.  The closing pressure was 15 with a Barraquer tonometer.  Polysporin ophthalmic ointment, a patch and shield were placed.  The patient was awakened and taken to recovery in satisfactory condition.  COMPLICATIONS:  None.  DURATION:  2 hours.     Chrystie Nose. Zigmund Daniel, M.D.     JDM/MEDQ  D:  02/05/2016  T:  02/06/2016  Job:  [id number]

## 2016-02-06 NOTE — Progress Notes (Signed)
02/06/2016, 6:31 AM  Mental Status:  Awake, Alert, Oriented  Anterior segment: Cornea  Clear    Anterior Chamber Clear    Lens:    IOL,   Intra Ocular Pressure 36 mmHg with Tonopen  Vitreous: Clear 45%gas bubble   Retina:  Attached Good laser reaction   Impression: Excellent result Retina attached  Final Diagnosis: Active Problems:   Rhegmatogenous retinal detachment of right eye   Plan: start post operative eye drops.    Add Alphagan, Xalatan and Timolol.  Discharge to home.  Give post operative instructions  MARIE, Scott Jacobson 02/06/2016, 6:31 AM

## 2016-02-07 ENCOUNTER — Encounter (HOSPITAL_COMMUNITY): Payer: Self-pay | Admitting: Ophthalmology

## 2016-02-12 ENCOUNTER — Inpatient Hospital Stay (INDEPENDENT_AMBULATORY_CARE_PROVIDER_SITE_OTHER): Payer: PRIVATE HEALTH INSURANCE | Admitting: Ophthalmology

## 2016-02-12 DIAGNOSIS — H338 Other retinal detachments: Secondary | ICD-10-CM

## 2016-02-20 ENCOUNTER — Encounter (INDEPENDENT_AMBULATORY_CARE_PROVIDER_SITE_OTHER): Payer: PRIVATE HEALTH INSURANCE | Admitting: Ophthalmology

## 2016-02-20 DIAGNOSIS — H338 Other retinal detachments: Secondary | ICD-10-CM

## 2016-02-28 ENCOUNTER — Encounter (INDEPENDENT_AMBULATORY_CARE_PROVIDER_SITE_OTHER): Payer: PRIVATE HEALTH INSURANCE | Admitting: Ophthalmology

## 2016-02-28 DIAGNOSIS — H338 Other retinal detachments: Secondary | ICD-10-CM

## 2016-03-20 ENCOUNTER — Encounter (INDEPENDENT_AMBULATORY_CARE_PROVIDER_SITE_OTHER): Payer: PRIVATE HEALTH INSURANCE | Admitting: Ophthalmology

## 2016-03-20 DIAGNOSIS — H338 Other retinal detachments: Secondary | ICD-10-CM

## 2016-05-29 ENCOUNTER — Encounter (INDEPENDENT_AMBULATORY_CARE_PROVIDER_SITE_OTHER): Payer: PRIVATE HEALTH INSURANCE | Admitting: Ophthalmology

## 2016-05-29 DIAGNOSIS — H43813 Vitreous degeneration, bilateral: Secondary | ICD-10-CM | POA: Diagnosis not present

## 2016-05-29 DIAGNOSIS — H338 Other retinal detachments: Secondary | ICD-10-CM | POA: Diagnosis not present

## 2016-05-29 DIAGNOSIS — H353122 Nonexudative age-related macular degeneration, left eye, intermediate dry stage: Secondary | ICD-10-CM | POA: Diagnosis not present

## 2016-07-21 ENCOUNTER — Encounter (INDEPENDENT_AMBULATORY_CARE_PROVIDER_SITE_OTHER): Payer: PRIVATE HEALTH INSURANCE | Admitting: Ophthalmology

## 2016-07-21 DIAGNOSIS — H353122 Nonexudative age-related macular degeneration, left eye, intermediate dry stage: Secondary | ICD-10-CM | POA: Diagnosis not present

## 2016-07-21 DIAGNOSIS — H353111 Nonexudative age-related macular degeneration, right eye, early dry stage: Secondary | ICD-10-CM | POA: Diagnosis not present

## 2016-07-21 DIAGNOSIS — H35371 Puckering of macula, right eye: Secondary | ICD-10-CM

## 2016-07-21 DIAGNOSIS — H26493 Other secondary cataract, bilateral: Secondary | ICD-10-CM

## 2016-07-21 DIAGNOSIS — H338 Other retinal detachments: Secondary | ICD-10-CM

## 2016-07-21 DIAGNOSIS — H43813 Vitreous degeneration, bilateral: Secondary | ICD-10-CM | POA: Diagnosis not present

## 2016-08-21 ENCOUNTER — Other Ambulatory Visit: Payer: Self-pay

## 2016-08-21 ENCOUNTER — Encounter (HOSPITAL_COMMUNITY)
Admission: AD | Disposition: A | Discharge status: Home / Self Care | Payer: Self-pay | Source: Other Acute Inpatient Hospital | Attending: Cardiovascular Disease

## 2016-08-21 ENCOUNTER — Encounter (HOSPITAL_COMMUNITY): Payer: Self-pay | Admitting: Cardiovascular Disease

## 2016-08-21 ENCOUNTER — Inpatient Hospital Stay (HOSPITAL_COMMUNITY)
Admission: AD | Admit: 2016-08-21 | Discharge: 2016-08-23 | DRG: 249 | Disposition: A | Discharge status: Home / Self Care | Payer: PRIVATE HEALTH INSURANCE | Source: Other Acute Inpatient Hospital | Attending: Cardiovascular Disease | Admitting: Cardiovascular Disease

## 2016-08-21 DIAGNOSIS — Z8249 Family history of ischemic heart disease and other diseases of the circulatory system: Secondary | ICD-10-CM | POA: Diagnosis not present

## 2016-08-21 DIAGNOSIS — H3530 Unspecified macular degeneration: Secondary | ICD-10-CM | POA: Diagnosis present

## 2016-08-21 DIAGNOSIS — Z87891 Personal history of nicotine dependence: Secondary | ICD-10-CM | POA: Diagnosis not present

## 2016-08-21 DIAGNOSIS — K219 Gastro-esophageal reflux disease without esophagitis: Secondary | ICD-10-CM | POA: Diagnosis present

## 2016-08-21 DIAGNOSIS — Z79899 Other long term (current) drug therapy: Secondary | ICD-10-CM

## 2016-08-21 DIAGNOSIS — E785 Hyperlipidemia, unspecified: Secondary | ICD-10-CM

## 2016-08-21 DIAGNOSIS — J302 Other seasonal allergic rhinitis: Secondary | ICD-10-CM | POA: Diagnosis present

## 2016-08-21 DIAGNOSIS — I2119 ST elevation (STEMI) myocardial infarction involving other coronary artery of inferior wall: Secondary | ICD-10-CM | POA: Diagnosis present

## 2016-08-21 DIAGNOSIS — I2510 Atherosclerotic heart disease of native coronary artery without angina pectoris: Secondary | ICD-10-CM | POA: Diagnosis present

## 2016-08-21 DIAGNOSIS — I25110 Atherosclerotic heart disease of native coronary artery with unstable angina pectoris: Secondary | ICD-10-CM

## 2016-08-21 DIAGNOSIS — I2111 ST elevation (STEMI) myocardial infarction involving right coronary artery: Secondary | ICD-10-CM | POA: Insufficient documentation

## 2016-08-21 DIAGNOSIS — N400 Enlarged prostate without lower urinary tract symptoms: Secondary | ICD-10-CM | POA: Diagnosis present

## 2016-08-21 HISTORY — PX: CARDIAC CATHETERIZATION: SHX172

## 2016-08-21 LAB — LIPID PANEL
CHOL/HDL RATIO: 3.3 ratio
CHOLESTEROL: 190 mg/dL (ref 0–200)
HDL: 58 mg/dL (ref >40)
LDL Cholesterol: 128 mg/dL — ABNORMAL HIGH (ref 0–99)
TRIGLYCERIDES: 19 mg/dL (ref <150)
VLDL: 4 mg/dL (ref 0–40)

## 2016-08-21 LAB — COMPREHENSIVE METABOLIC PANEL
ALBUMIN: 3.8 g/dL (ref 3.5–5.0)
ALT: 20 U/L (ref 17–63)
ANION GAP: 10 (ref 5–15)
AST: 29 U/L (ref 15–41)
Alkaline Phosphatase: 69 U/L (ref 38–126)
BILIRUBIN TOTAL: 1 mg/dL (ref 0.3–1.2)
BUN: 14 mg/dL (ref 6–20)
CO2: 20 mmol/L — ABNORMAL LOW (ref 22–32)
Calcium: 9 mg/dL (ref 8.9–10.3)
Chloride: 111 mmol/L (ref 101–111)
Creatinine, Ser: 0.94 mg/dL (ref 0.61–1.24)
Glucose, Bld: 145 mg/dL — ABNORMAL HIGH (ref 65–99)
POTASSIUM: 3.4 mmol/L — AB (ref 3.5–5.1)
Sodium: 141 mmol/L (ref 135–145)
TOTAL PROTEIN: 6.2 g/dL — AB (ref 6.5–8.1)

## 2016-08-21 LAB — POCT I-STAT, CHEM 8
BUN: 15 mg/dL (ref 6–20)
CALCIUM ION: 1.18 mmol/L (ref 1.15–1.40)
CREATININE: 0.8 mg/dL (ref 0.61–1.24)
Chloride: 109 mmol/L (ref 101–111)
Glucose, Bld: 139 mg/dL — ABNORMAL HIGH (ref 65–99)
HEMATOCRIT: 39 % (ref 39.0–52.0)
HEMOGLOBIN: 13.3 g/dL (ref 13.0–17.0)
Potassium: 3.4 mmol/L — ABNORMAL LOW (ref 3.5–5.1)
SODIUM: 145 mmol/L (ref 135–145)
TCO2: 22 mmol/L (ref 0–100)

## 2016-08-21 LAB — TROPONIN I
TROPONIN I: 4.68 ng/mL — AB (ref <0.03)
Troponin I: 0.27 ng/mL (ref <0.03)

## 2016-08-21 LAB — CBC
HCT: 39.7 % (ref 39.0–52.0)
Hemoglobin: 13.6 g/dL (ref 13.0–17.0)
MCH: 30.5 pg (ref 26.0–34.0)
MCHC: 34.3 g/dL (ref 30.0–36.0)
MCV: 89 fL (ref 78.0–100.0)
PLATELETS: 213 10*3/uL (ref 150–400)
RBC: 4.46 MIL/uL (ref 4.22–5.81)
RDW: 13.5 % (ref 11.5–15.5)
WBC: 14.9 10*3/uL — ABNORMAL HIGH (ref 4.0–10.5)

## 2016-08-21 LAB — PROTIME-INR
INR: 1.14
Prothrombin Time: 14.6 seconds (ref 11.4–15.2)

## 2016-08-21 LAB — APTT: aPTT: 93 seconds — ABNORMAL HIGH (ref 24–36)

## 2016-08-21 LAB — POCT ACTIVATED CLOTTING TIME
Activated Clotting Time: 357 seconds
Activated Clotting Time: 131 seconds

## 2016-08-21 SURGERY — LEFT HEART CATH AND CORONARY ANGIOGRAPHY
Anesthesia: LOCAL

## 2016-08-21 MED ORDER — SODIUM CHLORIDE 0.9 % IV SOLN
INTRAVENOUS | Status: DC | PRN
  Administered 2016-08-21: 1.75 mg/kg/h via INTRAVENOUS

## 2016-08-21 MED ORDER — EPINEPHRINE PF 1 MG/ML IJ SOLN
0.0000 ug/min | INTRAVENOUS | Status: DC
  Filled 2016-08-21: qty 4

## 2016-08-21 MED ORDER — MAGNESIUM SULFATE 50 % IJ SOLN
40.0000 meq | INTRAMUSCULAR | Status: DC
  Filled 2016-08-21: qty 10

## 2016-08-21 MED ORDER — SODIUM CHLORIDE 0.9 % IV SOLN
INTRAVENOUS | Status: DC
  Filled 2016-08-21: qty 2.5

## 2016-08-21 MED ORDER — DEXMEDETOMIDINE HCL IN NACL 400 MCG/100ML IV SOLN
0.1000 ug/kg/h | INTRAVENOUS | Status: DC
Start: 2016-08-22 — End: 2016-08-21

## 2016-08-21 MED ORDER — SODIUM CHLORIDE 0.9 % IV SOLN: 250.0000 mL | INTRAVENOUS | Status: DC | PRN

## 2016-08-21 MED ORDER — VANCOMYCIN HCL 10 G IV SOLR
1250.0000 mg | INTRAVENOUS | Status: DC
  Filled 2016-08-21: qty 1250

## 2016-08-21 MED ORDER — DEXTROSE 5 % IV SOLN
1.5000 g | INTRAVENOUS | Status: DC
  Filled 2016-08-21: qty 1.5

## 2016-08-21 MED ORDER — ATORVASTATIN CALCIUM 80 MG PO TABS
80.0000 mg | ORAL_TABLET | Freq: Every day | ORAL | Status: DC
  Administered 2016-08-22: 80 mg via ORAL
  Filled 2016-08-21 (×2): qty 1

## 2016-08-21 MED ORDER — NITROGLYCERIN IN D5W 200-5 MCG/ML-% IV SOLN
2.0000 ug/min | INTRAVENOUS | Status: DC
  Filled 2016-08-21: qty 250

## 2016-08-21 MED ORDER — FENTANYL CITRATE (PF) 100 MCG/2ML IJ SOLN
INTRAMUSCULAR | Status: DC | PRN
  Administered 2016-08-21: 25 ug via INTRAVENOUS

## 2016-08-21 MED ORDER — ATROPINE SULFATE 1 MG/10ML IJ SOSY
PREFILLED_SYRINGE | INTRAMUSCULAR | Status: DC | PRN
Start: 2016-08-21 — End: 2016-08-21
  Administered 2016-08-21: 0.5 mg via INTRAVENOUS

## 2016-08-21 MED ORDER — PHENYLEPHRINE HCL 10 MG/ML IJ SOLN
30.0000 ug/min | INTRAVENOUS | Status: DC
  Filled 2016-08-21: qty 2

## 2016-08-21 MED ORDER — DOPAMINE-DEXTROSE 3.2-5 MG/ML-% IV SOLN
INTRAVENOUS | Status: AC
  Filled 2016-08-21: qty 250

## 2016-08-21 MED ORDER — LATANOPROST 0.005 % OP SOLN
1.0000 [drp] | Freq: Every day | OPHTHALMIC | Status: DC
  Filled 2016-08-21: qty 2.5

## 2016-08-21 MED ORDER — LIDOCAINE HCL (PF) 1 % IJ SOLN
INTRAMUSCULAR | Status: DC | PRN
  Administered 2016-08-21: 15 mL
  Administered 2016-08-21: 5 mL

## 2016-08-21 MED ORDER — SODIUM CHLORIDE 0.9 % IV SOLN
INTRAVENOUS | Status: DC
  Filled 2016-08-21: qty 30

## 2016-08-21 MED ORDER — PAPAVERINE HCL 30 MG/ML IJ SOLN
INTRAMUSCULAR | Status: DC
  Filled 2016-08-21: qty 2.5

## 2016-08-21 MED ORDER — TRANEXAMIC ACID (OHS) PUMP PRIME SOLUTION
2.0000 mg/kg | INTRAVENOUS | Status: DC
  Filled 2016-08-21: qty 1.61

## 2016-08-21 MED ORDER — ONDANSETRON HCL 4 MG/2ML IJ SOLN: 4.0000 mg | Freq: Four times a day (QID) | INTRAMUSCULAR | Status: DC | PRN

## 2016-08-21 MED ORDER — TICAGRELOR 90 MG PO TABS
90.0000 mg | ORAL_TABLET | Freq: Two times a day (BID) | ORAL | Status: DC
  Administered 2016-08-22 – 2016-08-23 (×3): 90 mg via ORAL
  Filled 2016-08-21 (×3): qty 1

## 2016-08-21 MED ORDER — TAMSULOSIN HCL 0.4 MG PO CAPS
0.4000 mg | ORAL_CAPSULE | Freq: Every day | ORAL | Status: DC
  Administered 2016-08-22 – 2016-08-23 (×2): 0.4 mg via ORAL
  Filled 2016-08-21 (×3): qty 1

## 2016-08-21 MED ORDER — TRANEXAMIC ACID 1000 MG/10ML IV SOLN
1.5000 mg/kg/h | INTRAVENOUS | Status: DC
  Filled 2016-08-21: qty 25

## 2016-08-21 MED ORDER — TRANEXAMIC ACID (OHS) BOLUS VIA INFUSION
15.0000 mg/kg | INTRAVENOUS | Status: DC
  Filled 2016-08-21: qty 1205

## 2016-08-21 MED ORDER — NITROGLYCERIN IN D5W 200-5 MCG/ML-% IV SOLN: 2.0000 ug/min | INTRAVENOUS | Status: DC

## 2016-08-21 MED ORDER — POTASSIUM CHLORIDE 2 MEQ/ML IV SOLN
80.0000 meq | INTRAVENOUS | Status: DC
  Filled 2016-08-21: qty 40

## 2016-08-21 MED ORDER — SODIUM CHLORIDE 0.9 % IV SOLN
INTRAVENOUS | Status: DC | PRN
  Administered 2016-08-21: 80 mL/h via INTRAVENOUS
  Administered 2016-08-21: 250 mL

## 2016-08-21 MED ORDER — SODIUM CHLORIDE 0.9% FLUSH
3.0000 mL | Freq: Two times a day (BID) | INTRAVENOUS | Status: DC
  Administered 2016-08-21 – 2016-08-23 (×3): 3 mL via INTRAVENOUS

## 2016-08-21 MED ORDER — NITROGLYCERIN 1 MG/10 ML FOR IR/CATH LAB
INTRA_ARTERIAL | Status: DC | PRN
  Administered 2016-08-21: 200 ug via INTRACORONARY

## 2016-08-21 MED ORDER — BRIMONIDINE TARTRATE 0.2 % OP SOLN
1.0000 [drp] | Freq: Two times a day (BID) | OPHTHALMIC | Status: DC
  Filled 2016-08-21: qty 5

## 2016-08-21 MED ORDER — DEXMEDETOMIDINE HCL IN NACL 400 MCG/100ML IV SOLN
0.1000 ug/kg/h | INTRAVENOUS | Status: DC
  Filled 2016-08-21: qty 100

## 2016-08-21 MED ORDER — SODIUM CHLORIDE 0.9 % IV SOLN
1250.0000 mg | INTRAVENOUS | Status: DC
  Filled 2016-08-21: qty 1250

## 2016-08-21 MED ORDER — DOPAMINE-DEXTROSE 3.2-5 MG/ML-% IV SOLN
0.0000 ug/kg/min | INTRAVENOUS | Status: DC
  Filled 2016-08-21: qty 250

## 2016-08-21 MED ORDER — PLASMA-LYTE 148 IV SOLN
INTRAVENOUS | Status: DC
  Filled 2016-08-21: qty 2.5

## 2016-08-21 MED ORDER — CANGRELOR BOLUS VIA INFUSION
INTRAVENOUS | Status: DC | PRN
  Administered 2016-08-21: 2400 ug via INTRAVENOUS

## 2016-08-21 MED ORDER — TICAGRELOR 90 MG PO TABS
ORAL_TABLET | ORAL | Status: DC | PRN
  Administered 2016-08-21: 180 mg via ORAL

## 2016-08-21 MED ORDER — SODIUM CHLORIDE 0.9 % IV SOLN: INTRAVENOUS | Status: DC

## 2016-08-21 MED ORDER — DEXTROSE 5 % IV SOLN
750.0000 mg | INTRAVENOUS | Status: DC
  Filled 2016-08-21: qty 750

## 2016-08-21 MED ORDER — BIVALIRUDIN BOLUS VIA INFUSION - CUPID
INTRAVENOUS | Status: DC | PRN
Start: 2016-08-21 — End: 2016-08-21

## 2016-08-21 MED ORDER — ACETAMINOPHEN 325 MG PO TABS: 650.0000 mg | ORAL_TABLET | ORAL | Status: DC | PRN

## 2016-08-21 MED ORDER — TIMOLOL MALEATE 0.5 % OP SOLN
1.0000 [drp] | Freq: Two times a day (BID) | OPHTHALMIC | Status: DC
  Filled 2016-08-21: qty 5

## 2016-08-21 MED ORDER — SODIUM CHLORIDE 0.9 % IV SOLN
INTRAVENOUS | Status: DC | PRN
  Administered 2016-08-21: 4 ug/kg/min via INTRAVENOUS

## 2016-08-21 MED ORDER — DOPAMINE-DEXTROSE 3.2-5 MG/ML-% IV SOLN
INTRAVENOUS | Status: DC | PRN
  Administered 2016-08-21: 20 ug/kg/min via INTRAVENOUS

## 2016-08-21 MED ORDER — HEPARIN (PORCINE) IN NACL 2-0.9 UNIT/ML-% IJ SOLN
INTRAMUSCULAR | Status: DC | PRN
Start: 2016-08-21 — End: 2016-08-21
  Administered 2016-08-21: 1000 mL

## 2016-08-21 MED ORDER — GATIFLOXACIN 0.5 % OP SOLN
1.0000 [drp] | Freq: Four times a day (QID) | OPHTHALMIC | Status: DC
  Filled 2016-08-21: qty 2.5

## 2016-08-21 MED ORDER — HEPARIN (PORCINE) IN NACL 2-0.9 UNIT/ML-% IJ SOLN
INTRAMUSCULAR | Status: DC | PRN
  Administered 2016-08-21 (×2): 1000 mL

## 2016-08-21 MED ORDER — SODIUM CHLORIDE 0.9% FLUSH: 3.0000 mL | INTRAVENOUS | Status: DC | PRN

## 2016-08-21 MED ORDER — PREDNISOLONE ACETATE 1 % OP SUSP
1.0000 [drp] | Freq: Four times a day (QID) | OPHTHALMIC | Status: DC
  Filled 2016-08-21: qty 1

## 2016-08-21 MED ORDER — BIVALIRUDIN BOLUS VIA INFUSION - CUPID
INTRAVENOUS | Status: DC | PRN
  Administered 2016-08-21: 60 mg via INTRAVENOUS

## 2016-08-21 MED ORDER — ASPIRIN 81 MG PO CHEW
81.0000 mg | CHEWABLE_TABLET | Freq: Every day | ORAL | Status: DC
  Administered 2016-08-22 – 2016-08-23 (×2): 81 mg via ORAL
  Filled 2016-08-21 (×2): qty 1

## 2016-08-21 MED ORDER — IOPAMIDOL (ISOVUE-370) INJECTION 76%
INTRAVENOUS | Status: DC | PRN
  Administered 2016-08-21: 175 mL via INTRA_ARTERIAL

## 2016-08-21 SURGICAL SUPPLY — 27 items
BALLN EUPHORA RX 2.5X12 (BALLOONS) ×2
BALLN MOZEC 2.50X14 (BALLOONS) ×2
BALLN ~~LOC~~ TREK RX 3.75X15 (BALLOONS) ×2 IMPLANT
BALLOON EUPHORA RX 2.5X12 (BALLOONS) ×1 IMPLANT
BALLOON MOZEC 2.50X14 (BALLOONS) ×1 IMPLANT
CATH INFINITI 5 FR JL3.5 (CATHETERS) ×2 IMPLANT
CATH INFINITI 5FR ANG PIGTAIL (CATHETERS) ×2 IMPLANT
CATH INFINITI 5FR JL4 (CATHETERS) IMPLANT
CATH INFINITI JR4 5F (CATHETERS) ×2 IMPLANT
CATH VISTA GUIDE 6FR JR4 (CATHETERS) ×2 IMPLANT
DEVICE RAD COMP TR BAND LRG (VASCULAR PRODUCTS) ×2 IMPLANT
ELECT DEFIB PAD ADLT CADENCE (PAD) ×2 IMPLANT
GLIDESHEATH SLEND SS 6F .021 (SHEATH) ×2 IMPLANT
GUIDE CATH RUNWAY 6FR FR4 (CATHETERS) ×2 IMPLANT
GUIDEWIRE 3MM J TIP .035 145 (WIRE) ×2 IMPLANT
GUIDEWIRE INQWIRE 1.5J.035X260 (WIRE) ×1 IMPLANT
INQWIRE 1.5J .035X260CM (WIRE) ×2
KIT ENCORE 26 ADVANTAGE (KITS) ×4 IMPLANT
KIT HEART LEFT (KITS) ×4 IMPLANT
PACK CARDIAC CATHETERIZATION (CUSTOM PROCEDURE TRAY) ×4 IMPLANT
SHEATH PINNACLE 6F 10CM (SHEATH) ×2 IMPLANT
SHEATH PINNACLE 7F 10CM (SHEATH) ×2 IMPLANT
STENT VISION RX 3.5X18 (Permanent Stent) ×2 IMPLANT
SYR MEDRAD MARK V 150ML (SYRINGE) ×2 IMPLANT
TRANSDUCER W/STOPCOCK (MISCELLANEOUS) ×4 IMPLANT
TUBING CIL FLEX 10 FLL-RA (TUBING) ×4 IMPLANT
WIRE COUGAR XT STRL 190CM (WIRE) ×4 IMPLANT

## 2016-08-21 NOTE — H&P (Signed)
Cardiology Consult    Patient ID: Scott Jacobson MRN: [id number], DOB/AGE: 1951-06-10   Admit date: 08/21/2016 Date of Consult: 08/21/2016  Primary Physician: Neale Burly, MD   Patient Profile    Scott Jacobson is a 65 year old male with no past cardiac history. He presented to [facility] ED with chest pain, had inferior ST elevation, code STEMI activated. He was transferred to [facility] for cath.   History of Present Illness    Scott Jacobson began having chest pain yesterday 08/20/16 while at home. He took a [employer] (he says he was given Nitro when he had a mini stroke). This relieved his pain, but the chest pain returned today. It was severe in nature causing him to seek medical attention. He drove himself to [facility] ED.  EKG on arrival to [facility] showed marked ST elevation in the inferior leads. He continued to have chest pain so he was transferred to [facility] for urgent heart catheterization.   He was mostly pain free on arrival to the [facility], rating his pain 2/10. He denies ever having any chest pain like this before. He has never had a MI, denies family history of MI. He is not a smoker, endorses occasional ETOH use.   Past Medical History   Past Medical History:  Diagnosis Date  . Arthritis    "probably"  . BPH (benign prostatic hyperplasia)   . GERD (gastroesophageal reflux disease)   . Kidney stones   . Macular degeneration   . Seasonal allergies     Past Surgical History:  Procedure Laterality Date  . APPENDECTOMY    . BACK SURGERY    . CATARACT EXTRACTION W/ INTRAOCULAR LENS  IMPLANT, BILATERAL Bilateral   . COLONOSCOPY    . EYE SURGERY    . GAS INSERTION Right 02/05/2016   Procedure: INSERTION OF GAS-C3F8;  Surgeon: Hayden Pedro, MD;  Location: [place];  Service: Ophthalmology;  Laterality: Right;  . HAND SURGERY Right    thumb; S/P "cut his arm"  . LASER PHOTO ABLATION Right 02/05/2016   Procedure: LASER PHOTO ABLATION-HEADSCOPE LASER;  Surgeon: Hayden Pedro,  MD;  Location: [place];  Service: Ophthalmology;  Laterality: Right;  . LUMBAR DISC SURGERY  ~ 2002  . RETINAL DETACHMENT REPAIR W/ SCLERAL BUCKLE LE Right 02/05/2016  . SCLERAL BUCKLE Right 02/05/2016   Procedure: SCLERAL BUCKLE;  Surgeon: Hayden Pedro, MD;  Location: [place];  Service: Ophthalmology;  Laterality: Right;  . TONSILLECTOMY       Allergies  No Known Allergies  Inpatient Medications      Family History    Family History  Problem Relation Age of Onset  . Hypertension Mother   . Congestive Heart Failure Father     Social History    Social History   Social History  . Marital status: Married    Spouse name: N/A  . Number of children: N/A  . Years of education: N/A   Occupational History  . Not on file.   Social History Main Topics  . Smoking status: Former Smoker    Packs/day: 3.00    Years: 12.00    Types: Cigarettes    Quit date: 02/04/1976  . Smokeless tobacco: Never Used  . Alcohol use Yes     Comment: 02/06/2016 "a beer a couple times/month"  . Drug use: No  . Sexual activity: Not Currently   Other Topics Concern  . Not on file   Social History  Narrative  . No narrative on file     Review of Systems    General:  No chills, fever, night sweats or weight changes.  Cardiovascular:  + chest pain, dyspnea on exertion, edema, orthopnea, palpitations, paroxysmal nocturnal dyspnea. Dermatological: No rash, lesions/masses Respiratory: No cough, dyspnea Urologic: No hematuria, dysuria Abdominal:   No nausea, vomiting, diarrhea, bright red blood per rectum, melena, or hematemesis Neurologic:  No visual changes, wkns, changes in mental status. All other systems reviewed and are otherwise negative except as noted above.  Physical Exam    There were no vitals taken for this visit.  General: Pleasant, NAD Psych: Normal affect. Neuro: Alert and oriented X 3. Moves all extremities spontaneously. HEENT: Normal  Neck: Supple without bruits or JVD. Lungs:   Resp regular and unlabored, CTA. Heart: RRR no s3, s4, or murmurs. Abdomen: Soft, non-tender, non-distended, BS + x 4.  Extremities: No clubbing, cyanosis or edema. DP/PT/Radials 2+ and equal bilaterally.  Labs     Lab Results  Component Value Date   WBC 7.4 02/05/2016   HGB 14.2 02/05/2016   HCT 43.2 02/05/2016   MCV 90.8 02/05/2016   PLT 184 02/05/2016     Radiology Studies    No results found.  EKG & Cardiac Imaging    EKG: NSR with inferior ST elevation  Echocardiogram: pending.   Assessment & Plan    1. STEMI: Patient taken urgently to the cath lab. MD recommendations to follow.    Signed, Arbutus Leas, NP 08/21/2016, 4:33 PM Pager: [phone]  I have personally seen and examined this patient with Jettie Booze, NP. I agree with the assessment and plan as outlined above. Inferior STEMI. Plan emergent cath.   Lauree Chandler 08/21/2016 7:04 PM

## 2016-08-22 ENCOUNTER — Inpatient Hospital Stay (HOSPITAL_COMMUNITY): Payer: PRIVATE HEALTH INSURANCE

## 2016-08-22 ENCOUNTER — Encounter (HOSPITAL_COMMUNITY): Payer: Self-pay | Admitting: Cardiovascular Disease

## 2016-08-22 ENCOUNTER — Encounter (HOSPITAL_COMMUNITY)
Admission: AD | Disposition: A | Discharge status: Home / Self Care | Payer: Self-pay | Source: Other Acute Inpatient Hospital | Attending: Cardiovascular Disease

## 2016-08-22 DIAGNOSIS — I25110 Atherosclerotic heart disease of native coronary artery with unstable angina pectoris: Secondary | ICD-10-CM

## 2016-08-22 DIAGNOSIS — I2510 Atherosclerotic heart disease of native coronary artery without angina pectoris: Secondary | ICD-10-CM

## 2016-08-22 LAB — BASIC METABOLIC PANEL
ANION GAP: 6 (ref 5–15)
Anion gap: 9 (ref 5–15)
BUN: 12 mg/dL (ref 6–20)
BUN: 13 mg/dL (ref 6–20)
CALCIUM: 8.8 mg/dL — AB (ref 8.9–10.3)
CHLORIDE: 110 mmol/L (ref 101–111)
CO2: 21 mmol/L — AB (ref 22–32)
CO2: 24 mmol/L (ref 22–32)
CREATININE: 0.79 mg/dL (ref 0.61–1.24)
Calcium: 8.4 mg/dL — ABNORMAL LOW (ref 8.9–10.3)
Chloride: 106 mmol/L (ref 101–111)
Creatinine, Ser: 0.86 mg/dL (ref 0.61–1.24)
GFR calc Af Amer: >60 mL/min (ref >60)
GFR calc non Af Amer: >60 mL/min (ref >60)
GLUCOSE: 128 mg/dL — AB (ref 65–99)
Glucose, Bld: 120 mg/dL — ABNORMAL HIGH (ref 65–99)
POTASSIUM: 3.5 mmol/L (ref 3.5–5.1)
Potassium: 3.5 mmol/L (ref 3.5–5.1)
SODIUM: 140 mmol/L (ref 135–145)
SODIUM: 136 mmol/L (ref 135–145)

## 2016-08-22 LAB — HEMOGLOBIN A1C
Hgb A1c MFr Bld: 5.3 % (ref 4.8–5.6)
Mean Plasma Glucose: 105 mg/dL

## 2016-08-22 LAB — CBC
HCT: 36.8 % — ABNORMAL LOW (ref 39.0–52.0)
Hemoglobin: 12.1 g/dL — ABNORMAL LOW (ref 13.0–17.0)
MCH: 29.7 pg (ref 26.0–34.0)
MCHC: 32.9 g/dL (ref 30.0–36.0)
MCV: 90.2 fL (ref 78.0–100.0)
PLATELETS: 203 10*3/uL (ref 150–400)
RBC: 4.08 MIL/uL — ABNORMAL LOW (ref 4.22–5.81)
RDW: 13.7 % (ref 11.5–15.5)
WBC: 13.3 10*3/uL — ABNORMAL HIGH (ref 4.0–10.5)

## 2016-08-22 LAB — TROPONIN I
Troponin I: 29.83 ng/mL (ref <0.03)
Troponin I: 50.54 ng/mL (ref <0.03)

## 2016-08-22 LAB — ECHOCARDIOGRAM COMPLETE: Weight: 2832 oz

## 2016-08-22 LAB — MRSA PCR SCREENING: MRSA by PCR: NEGATIVE

## 2016-08-22 SURGERY — CORONARY ARTERY BYPASS GRAFTING (CABG)
Anesthesia: General | Site: Chest

## 2016-08-22 MED ORDER — METOPROLOL TARTRATE 25 MG PO TABS
25.0000 mg | ORAL_TABLET | Freq: Two times a day (BID) | ORAL | Status: DC
  Administered 2016-08-22 – 2016-08-23 (×2): 25 mg via ORAL
  Filled 2016-08-22 (×2): qty 1

## 2016-08-22 MED ORDER — NITROGLYCERIN 0.4 MG SL SUBL
0.4000 mg | SUBLINGUAL_TABLET | SUBLINGUAL | Status: DC | PRN
  Administered 2016-08-22: 0.4 mg via SUBLINGUAL

## 2016-08-22 MED ORDER — NITROGLYCERIN 0.4 MG SL SUBL
SUBLINGUAL_TABLET | SUBLINGUAL | Status: AC
  Filled 2016-08-22: qty 1

## 2016-08-22 MED FILL — Verapamil HCl IV Soln 2.5 MG/ML: INTRAVENOUS | Qty: 2 | Status: AC

## 2016-08-22 NOTE — Progress Notes (Signed)
     SUBJECTIVE: No chest pain or dyspnea this am.   Tele: sinus  BP 103/66   Pulse 77   Temp 99.3 F (37.4 C) (Oral)   Resp 13   Wt 177 lb (80.3 kg)   SpO2 96%   BMI 26.14 kg/m   Intake/Output Summary (Last 24 hours) at 08/22/16 0726 Last data filed at 08/22/16 0600  Gross per 24 hour  Intake              900 ml  Output              850 ml  Net               50 ml    PHYSICAL EXAM General: Well developed, well nourished, in no acute distress. Alert and oriented x 3.  Psych:  Good affect, responds appropriately Neck: No JVD. No masses noted.  Lungs: Clear bilaterally with no wheezes or rhonci noted.  Heart: RRR with no murmurs noted. Abdomen: Bowel sounds are present. Soft, non-tender.  Extremities: No lower extremity edema.   LABS: Basic Metabolic Panel:  Recent Labs  08/21/16 1640 08/21/16 1642  NA 141 145  K 3.4* 3.4*  CL 111 109  CO2 20*  --   GLUCOSE 145* 139*  BUN 14 15  CREATININE 0.94 0.80  CALCIUM 9.0  --    CBC:  Recent Labs  08/21/16 1640 08/21/16 1642  WBC 14.9*  --   HGB 13.6 13.3  HCT 39.7 39.0  MCV 89.0  --   PLT 213  --    Cardiac Enzymes:  Recent Labs  08/21/16 1640 08/21/16 1937 08/22/16 0016  TROPONINI 0.27* 4.68* 29.83*   Fasting Lipid Panel:  Recent Labs  08/21/16 1640  CHOL 190  HDL 58  LDLCALC 128*  TRIG 19  CHOLHDL 3.3    Current Meds: . aspirin  81 mg Oral Daily  . atorvastatin  80 mg Oral q1800  . brimonidine  1 drop Right Eye BID  . gatifloxacin  1 drop Right Eye QID  . latanoprost  1 drop Right Eye QHS  . prednisoLONE acetate  1 drop Right Eye QID  . sodium chloride flush  3 mL Intravenous Q12H  . tamsulosin  0.4 mg Oral Daily  . ticagrelor  90 mg Oral BID  . timolol  1 drop Right Eye BID     ASSESSMENT AND PLAN:  1. CAD/Inferior STEMI: Pt admitted 08/21/16 with an inferior STEMI. The RCA was sub-totally occluded. He also has left main disease and severe disease in the diagonal and mid  Circumflex. We initially attempted balloon angioplasty of the RCA alone with planning for CABG today but he re-occluded his RCA and we placed a bare metal stent in the mid RCA. We have asked CT surgery to see him today. Will plan medical management for one month with ASA/Brilinta/statin then will consider CABG. Will add low dose beta blocker. Will add Ace-inh before d/c if BP stable. Echo later today.   Transfer to tele unit.   Lauree Chandler  11/17/20177:26 AM

## 2016-08-22 NOTE — Progress Notes (Signed)
CARDIAC REHAB PHASE I   Pt just walked over from 2S. Feels well. Discussed with pt and wife MI, stent, restrictions, Brilinta, NTG, and preop education. Voiced understanding. Did not give ex gl due to other CAD but told pt he could do light activity. Will await CABG before referring to CRPII.  Carroll, ACSM 08/22/2016 12:17 PM

## 2016-08-22 NOTE — Progress Notes (Signed)
EKG CRITICAL VALUE     12 lead EKG performed.  Critical value noted. Donnelly Angelica, RN notified.   Cheryle Dark H, CCT 08/22/2016 7:29 AM

## 2016-08-22 NOTE — Progress Notes (Signed)
Right femoral sheath removed. Manual pressure held for 50 minutes until hemostasis occurred. Right femoral site is a level one with minimal bruising. Absent of bleeding or hematoma. Patient tolerated well. Vital signs stable. Patient educated on post-sheath precautions and care. Will continue to monitor per protocol.

## 2016-08-22 NOTE — Consult Note (Signed)
[address]       [place] [address]             [phone]      Cardiothoracic Surgery Consultation  Reason for Consult: Left main and severe 3-vessel coronary disease s/p acute inferior STEMI Referring Physician: Dr. Lauree Chandler  Scott Jacobson is an 65 y.o. male.  HPI:   The patient is a 65 year old gentleman in fairly good health with no prior cardiac history who developed chest pain at home on 08/20/2016. He took a SL NTG that he had at home with relief. Then yesterday the pain recurred while he was 36 ft up on a ladder hanging siding. It was severe and prompted him to drive himself to [facility] ED where an ECG showed an inferior STEMI. He continued to have chest pain and was transferred to [facility] for urgent cath. His pain was almost gone down to 2/10 when he arrived. Cath showed a 99% thrombotic lesion in the mid RCA as the culprit. There was also 70% ostial and distal LM, 99% OM2 stenosis and 99% stenosis in a moderate sized diagonal. I was called to evaluate in the cath lab and after the lesion was opened with PTCA it looked much better with marked improvement in his ST elevation and pain. We planned to keep him on cangrelor overnight and do surgery today but shortly after that he developed severe chest pain and inferior ST elevation again and relook showed restenosis and it was opened and we decided to treat this with a BMS and keep on Brilinta for a month before doing CABG. Since the procedure he has done well and is free of chest pain. Troponin went up to 50 this am and ECG shows residual of inferior STEMI.  He had a retinal detachment repaired in May and had some chest pain radiating into jaw associated with nausea, diaphoresis and SOB. It sounded like angina but troponin x 1 was negative and ECG was normal so it was felt to be GI. He says that he has not had recurrence until now.  Past Medical History:  Diagnosis Date  . Arthritis    "probably"  . BPH  (benign prostatic hyperplasia)   . GERD (gastroesophageal reflux disease)   . Kidney stones   . Macular degeneration   . Seasonal allergies     Past Surgical History:  Procedure Laterality Date  . APPENDECTOMY    . BACK SURGERY    . CARDIAC CATHETERIZATION N/A 08/21/2016   Procedure: Left Heart Cath and Coronary Angiography;  Surgeon: Burnell Blanks, MD;  Location: [facility];  Service: Cardiovascular;  Laterality: N/A;  . CARDIAC CATHETERIZATION N/A 08/21/2016   Procedure: Coronary Stent Intervention;  Surgeon: Burnell Blanks, MD;  Location: [facility];  Service: Cardiovascular;  Laterality: N/A;  . CATARACT EXTRACTION W/ INTRAOCULAR LENS  IMPLANT, BILATERAL Bilateral   . COLONOSCOPY    . EYE SURGERY    . GAS INSERTION Right 02/05/2016   Procedure: INSERTION OF [id number];  Surgeon: Hayden Pedro, MD;  Location: [facility];  Service: Ophthalmology;  Laterality: Right;  . HAND SURGERY Right    thumb; S/P "cut his arm"  . LASER PHOTO ABLATION Right 02/05/2016   Procedure: LASER PHOTO ABLATION-HEADSCOPE LASER;  Surgeon: Hayden Pedro, MD;  Location: [facility];  Service: Ophthalmology;  Laterality: Right;  . LUMBAR DISC SURGERY  ~ 2002  . RETINAL DETACHMENT REPAIR W/  SCLERAL BUCKLE LE Right 02/05/2016  . SCLERAL BUCKLE Right 02/05/2016   Procedure: SCLERAL BUCKLE;  Surgeon: Hayden Pedro, MD;  Location: [facility];  Service: Ophthalmology;  Laterality: Right;  . TONSILLECTOMY      Family History  Problem Relation Age of Onset  . Hypertension Mother   . Congestive Heart Failure Father     Social History:  reports that he quit smoking about 40 years ago. His smoking use included Cigarettes. He has a 36.00 pack-year smoking history. He has never used smokeless tobacco. He reports that he drinks alcohol. He reports that he does not use drugs.  He works for himself Air cabin crew and siding.  Allergies: No Known Allergies  Medications:  I have reviewed the  patient's current medications. Prior to Admission:  Prescriptions Prior to Admission  Medication Sig Dispense Refill Last Dose  . cetirizine (ZYRTEC) 10 MG tablet Take 10 mg by mouth daily.    08/20/2016 at Unknown time  . Multiple Vitamins-Minerals (ICAPS MV PO) Take 1 tablet by mouth 2 (two) times daily.    08/21/2016 at Unknown time  . PANTOPRAZOLE SODIUM PO Take 1 tablet by mouth daily.   08/21/2016 at Unknown time  . Polyvinyl Alcohol-Povidone (REFRESH OP) Place 1 drop into both eyes as needed (for dry eyes).   08/21/2016 at Unknown time  . tamsulosin (FLOMAX) 0.4 MG CAPS capsule Take 0.4 mg by mouth daily.   08/21/2016 at Unknown time  . bacitracin-polymyxin b (POLYSPORIN) ophthalmic ointment Place 1 application into the right eye 3 (three) times daily. apply to eye every 12 hours while awake (Patient not taking: Reported on 08/21/2016) 3.5 g 0 Not Taking at Unknown time  . brimonidine (ALPHAGAN) 0.2 % ophthalmic solution Place 1 drop into the right eye 2 (two) times daily. (Patient not taking: Reported on 08/21/2016) 5 mL 12 Not Taking at Unknown time  . gatifloxacin (ZYMAXID) 0.5 % SOLN Place 1 drop into the right eye 4 (four) times daily. (Patient not taking: Reported on 08/21/2016)   Not Taking at Unknown time  . HYDROcodone-acetaminophen (NORCO/VICODIN) 5-325 MG tablet Take 1-2 tablets by mouth every 4 (four) hours as needed for moderate pain. (Patient not taking: Reported on 08/21/2016) 30 tablet 0 Completed Course at Unknown time  . latanoprost (XALATAN) 0.005 % ophthalmic solution Place 1 drop into the right eye at bedtime. (Patient not taking: Reported on 08/21/2016) 2.5 mL 12 Not Taking at Unknown time  . prednisoLONE acetate (PRED FORTE) 1 % ophthalmic suspension Place 1 drop into the right eye 4 (four) times daily. (Patient not taking: Reported on 08/21/2016) 5 mL 0 Not Taking at Unknown time  . timolol (TIMOPTIC) 0.5 % ophthalmic solution Place 1 drop into the right eye 2 (two)  times daily. (Patient not taking: Reported on 08/21/2016) 10 mL 12 Not Taking at Unknown time   Scheduled: . aspirin  81 mg Oral Daily  . atorvastatin  80 mg Oral q1800  . brimonidine  1 drop Right Eye BID  . gatifloxacin  1 drop Right Eye QID  . latanoprost  1 drop Right Eye QHS  . prednisoLONE acetate  1 drop Right Eye QID  . sodium chloride flush  3 mL Intravenous Q12H  . tamsulosin  0.4 mg Oral Daily  . ticagrelor  90 mg Oral BID  . timolol  1 drop Right Eye BID   Continuous:  LKG:MWNUUV chloride, acetaminophen, nitroGLYCERIN, ondansetron (ZOFRAN) IV, sodium chloride flush Anti-infectives    Start  Dose/Rate Route Frequency Ordered Stop   08/22/16 0400  vancomycin (VANCOCIN) 1,250 mg in sodium chloride 0.9 % 250 mL IVPB  Status:  Discontinued     1,250 mg 166.7 mL/hr over 90 Minutes Intravenous To Surgery 08/21/16 1747 08/21/16 1846   08/22/16 0400  cefUROXime (ZINACEF) 1.5 g in dextrose 5 % 50 mL IVPB  Status:  Discontinued     1.5 g 100 mL/hr over 30 Minutes Intravenous To Surgery 08/21/16 1747 08/21/16 1846   08/22/16 0400  cefUROXime (ZINACEF) 750 mg in dextrose 5 % 50 mL IVPB  Status:  Discontinued     750 mg 100 mL/hr over 30 Minutes Intravenous To Surgery 08/21/16 1741 08/21/16 1846   08/22/16 0400  vancomycin (VANCOCIN) 1,250 mg in sodium chloride 0.9 % 250 mL IVPB  Status:  Discontinued     1,250 mg 166.7 mL/hr over 90 Minutes Intravenous To Surgery 08/21/16 2027 08/21/16 2100   08/22/16 0400  cefUROXime (ZINACEF) 1.5 g in dextrose 5 % 50 mL IVPB  Status:  Discontinued     1.5 g 100 mL/hr over 30 Minutes Intravenous To Surgery 08/21/16 2027 08/21/16 2100   08/22/16 0400  cefUROXime (ZINACEF) 750 mg in dextrose 5 % 50 mL IVPB  Status:  Discontinued     750 mg 100 mL/hr over 30 Minutes Intravenous To Surgery 08/21/16 2025 08/21/16 2100      Results for orders placed or performed during the hospital encounter of 08/21/16 (from the past 48 hour(s))  MRSA PCR  Screening     Status: None   Collection Time: 08/21/16 12:48 AM  Result Value Ref Range   MRSA by PCR NEGATIVE NEGATIVE    Comment:        The GeneXpert MRSA Assay (FDA approved for NASAL specimens only), is one component of a comprehensive MRSA colonization surveillance program. It is not intended to diagnose MRSA infection nor to guide or monitor treatment for MRSA infections.   Comprehensive metabolic panel     Status: Abnormal   Collection Time: 08/21/16  4:40 PM  Result Value Ref Range   Sodium 141 135 - 145 mmol/L   Potassium 3.4 (L) 3.5 - 5.1 mmol/L   Chloride 111 101 - 111 mmol/L   CO2 20 (L) 22 - 32 mmol/L   Glucose, Bld 145 (H) 65 - 99 mg/dL   BUN 14 6 - 20 mg/dL   Creatinine, Ser 0.94 0.61 - 1.24 mg/dL   Calcium 9.0 8.9 - 10.3 mg/dL   Total Protein 6.2 (L) 6.5 - 8.1 g/dL   Albumin 3.8 3.5 - 5.0 g/dL   AST 29 15 - 41 U/L   ALT 20 17 - 63 U/L   Alkaline Phosphatase 69 38 - 126 U/L   Total Bilirubin 1.0 0.3 - 1.2 mg/dL   GFR calc non Af Amer >60 >60 mL/min   GFR calc Af Amer >60 >60 mL/min    Comment: (NOTE) The eGFR has been calculated using the CKD EPI equation. This calculation has not been validated in all clinical situations. eGFR's persistently <60 mL/min signify possible Chronic Kidney Disease.    Anion gap 10 5 - 15  Lipid panel     Status: Abnormal   Collection Time: 08/21/16  4:40 PM  Result Value Ref Range   Cholesterol 190 0 - 200 mg/dL   Triglycerides 19 <150 mg/dL   HDL 58 >40 mg/dL   Total CHOL/HDL Ratio 3.3 RATIO   VLDL 4 0 - 40 mg/dL  LDL Cholesterol 128 (H) 0 - 99 mg/dL    Comment:        Total Cholesterol/HDL:CHD Risk Coronary Heart Disease Risk Table                     Men   Women  1/2 Average Risk   3.4   3.3  Average Risk       5.0   4.4  2 X Average Risk   9.6   7.1  3 X Average Risk  23.4   11.0        Use the calculated Patient Ratio above and the CHD Risk Table to determine the patient's CHD Risk.        ATP III  CLASSIFICATION (LDL):  <100     mg/dL   Optimal  100-129  mg/dL   Near or Above                    Optimal  130-159  mg/dL   Borderline  160-189  mg/dL   High  >190     mg/dL   Very High   CBC     Status: Abnormal   Collection Time: 08/21/16  4:40 PM  Result Value Ref Range   WBC 14.9 (H) 4.0 - 10.5 K/uL   RBC 4.46 4.22 - 5.81 MIL/uL   Hemoglobin 13.6 13.0 - 17.0 g/dL   HCT 39.7 39.0 - 52.0 %   MCV 89.0 78.0 - 100.0 fL   MCH 30.5 26.0 - 34.0 pg   MCHC 34.3 30.0 - 36.0 g/dL   RDW 13.5 11.5 - 15.5 %   Platelets 213 150 - 400 K/uL  Protime-INR     Status: None   Collection Time: 08/21/16  4:40 PM  Result Value Ref Range   Prothrombin Time 14.6 11.4 - 15.2 seconds   INR 1.14   APTT     Status: Abnormal   Collection Time: 08/21/16  4:40 PM  Result Value Ref Range   aPTT 93 (H) 24 - 36 seconds    Comment:        IF BASELINE aPTT IS ELEVATED, SUGGEST PATIENT RISK ASSESSMENT BE USED TO DETERMINE APPROPRIATE ANTICOAGULANT THERAPY.   Troponin I     Status: Abnormal   Collection Time: 08/21/16  4:40 PM  Result Value Ref Range   Troponin I 0.27 (HH) <0.03 ng/mL    Comment: CRITICAL RESULT CALLED TO, READ BACK BY AND VERIFIED WITH: Lanae Crumbly 1730 08/21/16 D BRADLEY   I-STAT, chem 8     Status: Abnormal   Collection Time: 08/21/16  4:42 PM  Result Value Ref Range   Sodium 145 135 - 145 mmol/L   Potassium 3.4 (L) 3.5 - 5.1 mmol/L   Chloride 109 101 - 111 mmol/L   BUN 15 6 - 20 mg/dL   Creatinine, Ser 0.80 0.61 - 1.24 mg/dL   Glucose, Bld 139 (H) 65 - 99 mg/dL   Calcium, Ion 1.18 1.15 - 1.40 mmol/L   TCO2 22 0 - 100 mmol/L   Hemoglobin 13.3 13.0 - 17.0 g/dL   HCT 39.0 39.0 - 52.0 %  Hemoglobin A1c     Status: None   Collection Time: 08/21/16  4:48 PM  Result Value Ref Range   Hgb A1c MFr Bld 5.3 4.8 - 5.6 %    Comment: (NOTE)         Pre-diabetes: 5.7 - 6.4  Diabetes: >6.4         Glycemic control for adults with diabetes: <7.0    Mean Plasma Glucose 105  mg/dL    Comment: (NOTE) Performed At: [facility] [place], [place] [id number] Lindon Romp MD TI:[id number]   POCT Activated clotting time     Status: None   Collection Time: 08/21/16  4:50 PM  Result Value Ref Range   Activated Clotting Time 357 seconds  Troponin I (serum)     Status: Abnormal   Collection Time: 08/21/16  7:37 PM  Result Value Ref Range   Troponin I 4.68 (HH) <0.03 ng/mL    Comment: CRITICAL VALUE NOTED.  VALUE IS CONSISTENT WITH PREVIOUSLY REPORTED AND CALLED VALUE.  POCT Activated clotting time     Status: None   Collection Time: 08/21/16  9:28 PM  Result Value Ref Range   Activated Clotting Time 131 seconds  Troponin I (serum)     Status: Abnormal   Collection Time: 08/22/16 12:16 AM  Result Value Ref Range   Troponin I 29.83 (HH) <0.03 ng/mL    Comment: CRITICAL VALUE NOTED.  VALUE IS CONSISTENT WITH PREVIOUSLY REPORTED AND CALLED VALUE.  Basic metabolic panel     Status: Abnormal   Collection Time: 08/22/16  6:49 AM  Result Value Ref Range   Sodium 140 135 - 145 mmol/L   Potassium 3.5 3.5 - 5.1 mmol/L   Chloride 110 101 - 111 mmol/L   CO2 21 (L) 22 - 32 mmol/L   Glucose, Bld 120 (H) 65 - 99 mg/dL   BUN 12 6 - 20 mg/dL   Creatinine, Ser 0.79 0.61 - 1.24 mg/dL   Calcium 8.4 (L) 8.9 - 10.3 mg/dL   GFR calc non Af Amer >60 >60 mL/min   GFR calc Af Amer >60 >60 mL/min    Comment: (NOTE) The eGFR has been calculated using the CKD EPI equation. This calculation has not been validated in all clinical situations. eGFR's persistently <60 mL/min signify possible Chronic Kidney Disease.    Anion gap 9 5 - 15  CBC     Status: Abnormal   Collection Time: 08/22/16  6:49 AM  Result Value Ref Range   WBC 13.3 (H) 4.0 - 10.5 K/uL   RBC 4.08 (L) 4.22 - 5.81 MIL/uL   Hemoglobin 12.1 (L) 13.0 - 17.0 g/dL   HCT 36.8 (L) 39.0 - 52.0 %   MCV 90.2 78.0 - 100.0 fL   MCH 29.7 26.0 - 34.0 pg   MCHC 32.9 30.0 - 36.0 g/dL   RDW 13.7 11.5 -  15.5 %   Platelets 203 150 - 400 K/uL  Troponin I (serum)     Status: Abnormal   Collection Time: 08/22/16  6:49 AM  Result Value Ref Range   Troponin I 50.54 (HH) <0.03 ng/mL    Comment: CRITICAL VALUE NOTED.  VALUE IS CONSISTENT WITH PREVIOUSLY REPORTED AND CALLED VALUE.    No results found.  Review of Systems  Constitutional: Positive for diaphoresis. Negative for chills, fever and malaise/fatigue.  HENT: Negative.   Eyes: Negative.   Respiratory: Positive for shortness of breath.   Cardiovascular: Positive for chest pain. Negative for orthopnea, leg swelling and PND.  Gastrointestinal: Negative.   Genitourinary: Negative.   Musculoskeletal: Negative.   Skin: Negative.   Neurological: Negative.   Endo/Heme/Allergies: Negative.   Psychiatric/Behavioral: Negative.    Blood pressure 103/66, pulse 77, temperature 98.6 F (37 C), temperature source Oral, resp. rate  13, weight 80.3 kg (177 lb), SpO2 96 %. Physical Exam  Constitutional: He is oriented to person, place, and time. He appears well-developed and well-nourished. No distress.  HENT:  Head: Normocephalic and atraumatic.  Mouth/Throat: Oropharynx is clear and moist.  Eyes: EOM are normal. Pupils are equal, round, and reactive to light.  Neck: Normal range of motion. Neck supple. No JVD present. No thyromegaly present.  Cardiovascular: Normal rate, regular rhythm, normal heart sounds and intact distal pulses.   No murmur heard. Respiratory: Effort normal and breath sounds normal. No respiratory distress.  GI: Soft. Bowel sounds are normal. He exhibits no distension and no mass. There is no tenderness.  Musculoskeletal: Normal range of motion. He exhibits no edema.  Lymphadenopathy:    He has no cervical adenopathy.  Neurological: He is alert and oriented to person, place, and time. He has normal strength. No cranial nerve deficit or sensory deficit.  Skin: Skin is warm and dry.  Psychiatric: He has a normal mood and  affect.   Physicians   Panel Physicians Referring Physician Case Authorizing Physician  Kathleene Hazel, MD (Primary)    Procedures   Coronary Stent Intervention  Left Heart Cath and Coronary Angiography  Conclusion     There is mild left ventricular systolic dysfunction.  LV end diastolic pressure is mildly elevated.  The left ventricular ejection fraction is 45-50% by visual estimate.  There is no mitral valve regurgitation.  Prox RCA lesion, 30 %stenosed.  Prox RCA to Mid RCA lesion, 50 %stenosed.  Ost LM to LM lesion, 70 %stenosed.  Ost 2nd Mrg to 2nd Mrg lesion, 60 %stenosed.  Mid Cx-2 lesion, 99 %stenosed.  Mid Cx-1 lesion, 60 %stenosed.  1st Diag lesion, 99 %stenosed.  Ost LAD to Prox LAD lesion, 30 %stenosed.  A STENT VISION RX 3.5X18 bare metal stent was successfully placed.  Mid RCA lesion, 99 %stenosed.  Post intervention, there is a 0% residual stenosis.   1. Acute inferior STEMI secondary to sub-totally occluded mid RCA 2. Successful PTCA/bare metal stent x 1 mid RCA 3. Residual moderately severe distal left main stenosis, severe stenosis distal Circumflex and diagonal branch. Moderate stenosis mid Circumflex and OM as well as the proximal RCA 4. Mild LV systolic dysfunction  Recommendations: Will plan to continue ASA and Brilinta for one month along with statin. Will have CT surgery see him tomorrow. As described in the cath note, we had considered angioplasty alone of the RCA and then CABG tomorrow. After the initial angioplasty of the RCA after we had broken down the sterile field, he had recurrence of chest pain. Re-look cath with recurrence of thrombotic lesion in the RCA. We elected to place a bare metal stent in the RCA and treat him medically for one month then consider CABG. Will start beta blocker tomorrow if BP tolerates.     Indications   Acute ST elevation myocardial infarction (STEMI) involving right coronary artery (HCC)  [I21.11 (ICD-10-CM)]  Procedural Details/Technique   Technical Details Indication: 65 yo male with history GERD, HTN admitted with inferior STEMI.   Procedure: The risks, benefits, complications, treatment options, and expected outcomes were discussed with the patient. Emergency consent obtained. The patient was further sedated with Versed and Fentanyl. The right wrist was prepped and draped in a sterile fashion. 1% lidocaine was used for local anesthesia. Using the modified Seldinger access technique, a 5 French sheath was placed in the right radial artery. 3 mg Verapamil was given through the sheath.  Angiomax bolus given and drip started. Standard diagnostic catheters were used to perform selective coronary angiography. He was found to have sub-total thrombotic occlusion of the mid RCA. He was also found to have moderately severe distal left main stenosis. The initial plan was to open the RCA with a balloon and consider CABG.   PCI Note: JR4 guiding catheter to engage RCA. Cangrelor bolus and drip. ACT over 300. Cougar IC wire down the RCA. 2.5 x 12 mm balloon used open the mid vessel occlusion. This balloon was inflated several times. Flow restored down the vessel. Proximal spasm noted due to catheter. At this time, we called CT surgery to come to the cath lab to discuss CABG given the distal left main stenosis. The decision was made to treat him medically tonight with IV Cangrelor and plan CABG tomorrow. A pigtail catheter was used to perform a left ventricular angiogram. The sheath was removed from the right radial artery and a Terumo hemostasis band was applied at the arteriotomy site on the right wrist.   At this time, the patient had recurrence of chest pain and ST segment changes. The right groin was prepped and draped. A 6 French sheath was placed in the right femoral artery. A JR4 guiding catheter was used to engage the RCA. A cougar IC wire was advanced down the RCA. A 2.5 x 12 mm balloon was  used to dilate the stenosis. At this time, after discussion with CT surgery, we decided to place a bare metal stent in the RCA and delay bypass of the left coronary system for one month. Brilinta 180 mg po x 1 was given. A 3.5 x 18 mm bare metal stent was deployed in the mid RCA. The stent was post-dilated with a 3.75 x 15 mm Dana balloon x 1. The stenosis was taken from 99% down to 0%.   A moderate sized hematoma developed in the right groin with the sheath in place. Manual pressure held but there was oozing around the sheath. Cangrelor stopped. Angiomax stopped. Sheath exchanged for 7 French sheath.     Estimated blood loss <50 mL.  During this procedure no sedation was administered.    Complications   Complications documented before study signed (08/21/2016 6:55 PM EST)      Log Level Complications   None Documented by Burnell Blanks, MD 08/21/2016 6:54 PM EST  Time Range: Intra-procedure    Coronary Findings   Dominance: Right  Left Main  Ost LM to LM lesion, 70% stenosed.  Left Anterior Descending  Ost LAD to Prox LAD lesion, 30% stenosed.  First Diagonal Branch  Vessel is moderate in size.  1st Diag lesion, 99% stenosed.  Second Diagonal Branch  Vessel is small in size.  Left Circumflex  Vessel is moderate in size.  Mid Cx-1 lesion, 60% stenosed.  Mid Cx-2 lesion, 99% stenosed.  Second Obtuse Marginal Branch  Vessel is moderate in size.  Ost 2nd Mrg to 2nd Mrg lesion, 60% stenosed.  Right Coronary Artery  Prox RCA lesion, 30% stenosed.  Prox RCA to Mid RCA lesion, 50% stenosed.  Mid RCA lesion, 99% stenosed. The lesion is thrombotic.  Angioplasty: Lesion crossed with guidewire. Pre-stent angioplasty was performed using a BALLOON EUPHORA RX2.5X12. A STENT VISION RX 3.5X18 bare metal stent was successfully placed. Stent strut is well apposed. Post-stent angioplasty was performed using a BALLOON  TREK RX [id number]. The pre-interventional distal flow is decreased  (TIMI 1). The post-interventional distal flow is normal (TIMI  3). The intervention was successful . No complications occurred at this lesion.  There is no residual stenosis post intervention.  Wall Motion              Left Heart   Left Ventricle The left ventricular size is normal. There is mild left ventricular systolic dysfunction. LV end diastolic pressure is mildly elevated. The left ventricular ejection fraction is 45-50% by visual estimate. There are LV function abnormalities. There is no evidence of mitral regurgitation.    Coronary Diagrams   Diagnostic Diagram     Post-Intervention Diagram     Implants     Permanent Stent  Stent Vision Rx 3.5x18 - [id number] - Implanted    Inventory item: Stent Vision Rx 3.5x18 Model/Cat number: [id number]  Manufacturer: [employer] Lot number: [id number]  Device identifier: [id number] Device identifier type: GS1  GUDID Information   Request status Successful    Brand name: MULTI-LINK VISION Version/Model: [id number]  Company name: [employer] MRI safety info as of 08/21/16: MR Conditional  Contains dry or latex rubber: No    GMDN P.T. name: Bare-metal coronary artery stent    As of 08/21/2016   Status: Implanted      PACS Images   Show images for Cardiac catheterization   Link to Procedure Log   Procedure Log    Hemo Data   Flowsheet Row Most Recent Value  AO Systolic Pressure 153 mmHg  AO Diastolic Pressure 65 mmHg  AO Mean 87 mmHg  LV Systolic Pressure 794 mmHg  LV Diastolic Pressure 7 mmHg  LV EDP 20 mmHg  Arterial Occlusion Pressure Extended Systolic Pressure 98 mmHg  Arterial Occlusion Pressure Extended Diastolic Pressure 57 mmHg  Arterial Occlusion Pressure Extended Mean Pressure 76 mmHg  Left Ventricular Apex Extended Systolic Pressure 327 mmHg  Left Ventricular Apex Extended Diastolic Pressure 8 mmHg  Left Ventricular Apex Extended EDP Pressure 29 mmHg    Assessment/Plan:  This 65 year old  gentleman has 70% left main and severe 3 vessel coronary artery disease presenting with an acute inferior STEMI due to a 99 thrombotic mid RCA stenosis. This has been successfully treated with a BMS and he will remain on Brilinta for the next month or so before stopping it to perform CABG. He will have an echo to assess his LV function and valves. I discussed surgical treatment with CABG with him, the indications and benefits and the plan as noted above. I will also discuss this with his wife. I will plan to see him back in the office in early January to make surgical plans.  I spent 60 minutes performing this consultation and > 50% of this time was spent face to face counseling and coordinating the care of this patient's left main and severe multi-vessel coronary artery disease.  Fernande Boyden Alfa Leibensperger 08/22/2016, 9:15 AM

## 2016-08-23 DIAGNOSIS — E785 Hyperlipidemia, unspecified: Secondary | ICD-10-CM

## 2016-08-23 DIAGNOSIS — I2510 Atherosclerotic heart disease of native coronary artery without angina pectoris: Secondary | ICD-10-CM

## 2016-08-23 LAB — BASIC METABOLIC PANEL
ANION GAP: 7 (ref 5–15)
BUN: 13 mg/dL (ref 6–20)
CALCIUM: 8.3 mg/dL — AB (ref 8.9–10.3)
CO2: 24 mmol/L (ref 22–32)
Chloride: 109 mmol/L (ref 101–111)
Creatinine, Ser: 0.75 mg/dL (ref 0.61–1.24)
Glucose, Bld: 108 mg/dL — ABNORMAL HIGH (ref 65–99)
Potassium: 3.5 mmol/L (ref 3.5–5.1)
SODIUM: 140 mmol/L (ref 135–145)

## 2016-08-23 LAB — CBC
HCT: 36.5 % — ABNORMAL LOW (ref 39.0–52.0)
Hemoglobin: 12.1 g/dL — ABNORMAL LOW (ref 13.0–17.0)
MCH: 30.1 pg (ref 26.0–34.0)
MCHC: 33.2 g/dL (ref 30.0–36.0)
MCV: 90.8 fL (ref 78.0–100.0)
PLATELETS: 184 10*3/uL (ref 150–400)
RBC: 4.02 MIL/uL — AB (ref 4.22–5.81)
RDW: 13.6 % (ref 11.5–15.5)
WBC: 9.5 10*3/uL (ref 4.0–10.5)

## 2016-08-23 LAB — TROPONIN I: TROPONIN I: 16.46 ng/mL — AB (ref <0.03)

## 2016-08-23 MED ORDER — NITROGLYCERIN 0.4 MG SL SUBL: 0.4000 mg | SUBLINGUAL_TABLET | SUBLINGUAL | 12 refills | Status: DC | PRN

## 2016-08-23 MED ORDER — TICAGRELOR 90 MG PO TABS: 90.0000 mg | ORAL_TABLET | Freq: Two times a day (BID) | ORAL | 11 refills | Status: DC

## 2016-08-23 MED ORDER — METOPROLOL TARTRATE 25 MG PO TABS: 25.0000 mg | ORAL_TABLET | Freq: Two times a day (BID) | ORAL | 11 refills | Status: AC

## 2016-08-23 MED ORDER — ASPIRIN EC 81 MG PO TBEC
81.0000 mg | DELAYED_RELEASE_TABLET | Freq: Every day | ORAL | Status: DC
Start: 2016-08-23 — End: 2021-12-06

## 2016-08-23 MED ORDER — ATORVASTATIN CALCIUM 80 MG PO TABS: 80.0000 mg | ORAL_TABLET | Freq: Every day | ORAL | 11 refills | Status: DC

## 2016-08-23 NOTE — Discharge Summary (Signed)
Discharge Summary    Patient ID: Scott Jacobson,  MRN: [id number], DOB/AGE: [birth year] 66 y.o.  Admit date: 08/21/2016 Discharge date: 08/23/2016  Primary Care Provider: Neale Burly Primary Cardiologist:  Dr. Lauree Chandler   Discharge Diagnoses    Principal Problem:   Acute ST elevation myocardial infarction (STEMI) of inferior wall [facility]) Active Problems:   Coronary artery disease involving native heart without angina pectoris   HLD (hyperlipidemia)   Allergies No Known Allergies  Diagnostic Studies/Procedures    Echo 08/22/16 - Left ventricle: The cavity size was normal. Systolic function was   normal. The estimated ejection fraction was in the range of 60%   to 65%. Wall motion was normal; there were no regional wall   motion abnormalities. Features are consistent with a pseudonormal   left ventricular filling pattern, with concomitant abnormal   relaxation and increased filling pressure (grade 2 diastolic   dysfunction). - Left atrium: The atrium was moderately dilated.  LHC 08/21/16 LM ostial 70 LAD ostial 30, D1 99 LCx mid 60, 99; OM 260 RCA proximal 30, 50, mid 99 - thrombotic EF 45-50 PCI: 3.5 x 18 mm bare metal stent to the mid RCA 1. Acute inferior STEMI secondary to sub-totally occluded mid RCA 2. Successful PTCA/bare metal stent x 1 mid RCA 3. Residual moderately severe distal left main stenosis, severe stenosis distal Circumflex and diagonal branch. Moderate stenosis mid Circumflex and OM as well as the proximal RCA 4. Mild LV systolic dysfunction Recommendations: Will plan to continue ASA and Brilinta for one month along with statin. Will have CT surgery see him tomorrow. As described in the cath note, we had considered angioplasty alone of the RCA and then CABG tomorrow. After the initial angioplasty of the RCA after we had broken down the sterile field, he had recurrence of chest pain. Re-look cath with recurrence of thrombotic lesion in the  RCA. We elected to place a bare metal stent in the RCA and treat him medically for one month then consider CABG. Will start beta blocker tomorrow if BP tolerates.         _____________   History of Present Illness     Scott Jacobson is a 65 y.o. male who presented to the [facility] emergency room with chest pain. ECG demonstrated evidence of inferior ST elevation myocardial infarction. He was transferred to [facility] for further evaluation and management.  Hospital Course     Consultants: None   He went directly to the cardiac catheterization lab at [facility]. He was found to have a subtotally occluded mid RCA and residual moderately severe distal left main stenosis, severe stenosis in the distal LCx and diagonal branch and moderate stenosis of the mid LCx and OM as well as the proximal RCA. Initial attempt was made at angioplasty of the RCA.  However, he developed recurrent chest pain in the cardiac catheterization lab. He had evidence of recurrent thrombotic lesion in the RCA and this was treated with a bare metal stent. TCTS consult was obtained. Plan was to keep him on Brilinta for one month or so and then proceed to surgery thereafter.  Echocardiogram demonstrated normal LV function with normal wall motion and moderate diastolic dysfunction. He was seen by Dr. Cyndia Bent on 08/22/16. Surgical treatment with CABG was discussed with the patient and his wife. Plan is to have the patient follow-up with Dr. Cyndia Bent in the office in early January to make surgical plans.  He  was placed on aspirin, Brilinta, statin, low-dose beta blocker. Blood pressure was too soft at ACE inhibitor. Patient was seen by Dr. Radford Pax this morning and felt stable for discharge to home. Follow-up troponin is trending downward (peak 50.54 >> now 16.46).   _____________  Discharge Vitals Blood pressure (!) 122/58, pulse 76, temperature 98 F (36.7 C), temperature source Oral, resp. rate 18, weight 177 lb (80.3  kg), SpO2 98 %.  Filed Weights   08/21/16 1734  Weight: 177 lb (80.3 kg)    Labs & Radiologic Studies    CBC  Recent Labs  08/22/16 0649 08/23/16 0302  WBC 13.3* 9.5  HGB 12.1* 12.1*  HCT 36.8* 36.5*  MCV 90.2 90.8  PLT 203 [id number]   Basic Metabolic Panel  Recent Labs  08/22/16 1207 08/23/16 0302  NA 136 140  K 3.5 3.5  CL 106 109  CO2 24 24  GLUCOSE 128* 108*  BUN 13 13  CREATININE 0.86 0.75  CALCIUM 8.8* 8.3*   Liver Function Tests  Recent Labs  08/21/16 1640  AST 29  ALT 20  ALKPHOS 69  BILITOT 1.0  PROT 6.2*  ALBUMIN 3.8    Cardiac Enzymes  Recent Labs  08/22/16 0016 08/22/16 0649 08/23/16 1409  TROPONINI 29.83* 50.54* 16.46*    Hemoglobin A1C  Recent Labs  08/21/16 1648  HGBA1C 5.3   Fasting Lipid Panel  Recent Labs  08/21/16 1640  CHOL 190  HDL 58  LDLCALC 128*  TRIG 19  CHOLHDL 3.3    Disposition   Pt is being discharged home today in good condition.  Patient will follow up in [place] initially.  Will need to try to arrange follow up in [place] in the future for his convenience.   Follow-up Plans & Appointments    Follow-up Information    Lauree Chandler, MD. Call in 1 week(s).   Specialty:  Cardiology Why:  The office will call to arrange a follow up in [facility] with Dr. Angelena Form or one of the PAs or NPs in 1 week Contact information: [place]. [address] [place] [address] [phone]        Bryan K Bartle, MD Follow up.   Specialty:  Cardiothoracic Surgery Why:  Dr. Vivi Martens office will call you to arrange follow up in January Contact information: [place] [address] [phone]          Discharge Instructions    Diet - low sodium heart healthy    Complete by:  As directed    Discharge wound care:    Complete by:  As directed    Call [phone] for any swelling, bleeding, bruising or fever.   Driving Restrictions    Complete by:  As directed     None for 1 week   Increase activity slowly    Complete by:  As directed    Lifting restrictions    Complete by:  As directed    No lifting 10 lbs or more for 2 weeks.   Sexual Activity Restrictions    Complete by:  As directed    None for 2 weeks.      Discharge Medications   Current Discharge Medication List    START taking these medications   Details  aspirin EC 81 MG tablet Take 1 tablet (81 mg total) by mouth daily.    atorvastatin (LIPITOR) 80 MG tablet Take 1 tablet (80 mg total) by mouth daily at 6 PM. Qty:  30 tablet, Refills: 11    metoprolol tartrate (LOPRESSOR) 25 MG tablet Take 1 tablet (25 mg total) by mouth 2 (two) times daily. Qty: 60 tablet, Refills: 11    nitroGLYCERIN (NITROSTAT) 0.4 MG SL tablet Place 1 tablet (0.4 mg total) under the tongue every 5 (five) minutes as needed for chest pain. Qty: 25 tablet, Refills: 12    ticagrelor (BRILINTA) 90 MG TABS tablet Take 1 tablet (90 mg total) by mouth 2 (two) times daily. Qty: 60 tablet, Refills: 11      CONTINUE these medications which have NOT CHANGED   Details  cetirizine (ZYRTEC) 10 MG tablet Take 10 mg by mouth daily.     Multiple Vitamins-Minerals (ICAPS MV PO) Take 1 tablet by mouth 2 (two) times daily.     pantoprazole (PROTONIX) 40 MG tablet Take 40 mg by mouth daily.    Polyvinyl Alcohol-Povidone (REFRESH OP) Place 1 drop into both eyes as needed (for dry eyes).    tamsulosin (FLOMAX) 0.4 MG CAPS capsule Take 0.4 mg by mouth daily.    bacitracin-polymyxin b (POLYSPORIN) ophthalmic ointment Place 1 application into the right eye 3 (three) times daily. apply to eye every 12 hours while awake Qty: 3.5 g, Refills: 0    brimonidine (ALPHAGAN) 0.2 % ophthalmic solution Place 1 drop into the right eye 2 (two) times daily. Qty: 5 mL, Refills: 12    gatifloxacin (ZYMAXID) 0.5 % SOLN Place 1 drop into the right eye 4 (four) times daily.    HYDROcodone-acetaminophen (NORCO/VICODIN) 5-325 MG tablet Take  1-2 tablets by mouth every 4 (four) hours as needed for moderate pain. Qty: 30 tablet, Refills: 0    latanoprost (XALATAN) 0.005 % ophthalmic solution Place 1 drop into the right eye at bedtime. Qty: 2.5 mL, Refills: 12    prednisoLONE acetate (PRED FORTE) 1 % ophthalmic suspension Place 1 drop into the right eye 4 (four) times daily. Qty: 5 mL, Refills: 0    timolol (TIMOPTIC) 0.5 % ophthalmic solution Place 1 drop into the right eye 2 (two) times daily. Qty: 10 mL, Refills: 12         Aspirin prescribed at discharge?  Yes High Intensity Statin Prescribed? (Lipitor 40-80mg  or Crestor 20-40mg ): Yes Beta Blocker Prescribed? Yes For EF <40%, was ACEI/ARB Prescribed? No: EF > 40; BP too low to add ADP Receptor Inhibitor Prescribed? (i.e. Plavix etc.-Includes Medically Managed Patients): Yes For EF <40%, Aldosterone Inhibitor Prescribed? No: EF > 19 Was EF assessed during THIS hospitalization? Yes Was Cardiac Rehab II ordered? (Included Medically managed Patients): No: pending CABG   Outstanding Labs/Studies   Need to arrange fasting Lipids and LFTs in 6-8 weeks   Duration of Discharge Encounter   Greater than 30 minutes including physician time.  Signed, Richardson Dopp, PA-C  08/23/2016, 3:25 PM

## 2016-08-23 NOTE — Progress Notes (Signed)
Patient discharged teaching given including activity, diet, follow-up appointments and medication. Patient verbalized understanding of all discharge instructions. IV access was dc'd. Vitals are stable. Skin is intact. Pt to be driven home by wife.

## 2016-08-23 NOTE — Progress Notes (Signed)
     SUBJECTIVE: No chest pain or dyspnea this am.   Tele: sinus  BP (!) 103/58   Pulse 77   Temp 98.3 F (36.8 C) (Oral)   Resp 16   Wt 177 lb (80.3 kg)   SpO2 97%   BMI 26.14 kg/m   Intake/Output Summary (Last 24 hours) at 08/23/16 1326 Last data filed at 08/22/16 1630  Gross per 24 hour  Intake              240 ml  Output              650 ml  Net             -410 ml    PHYSICAL EXAM General: Well developed, well nourished, in no acute distress. Alert and oriented x 3.  Psych:  Good affect, responds appropriately Neck: No JVD. No masses noted.  Lungs: Clear bilaterally with no wheezes or rhonci noted.  Heart: RRR with no murmurs noted. Abdomen: Bowel sounds are present. Soft, non-tender.  Extremities: No lower extremity edema.   LABS: Basic Metabolic Panel:  Recent Labs  08/22/16 1207 08/23/16 0302  NA 136 140  K 3.5 3.5  CL 106 109  CO2 24 24  GLUCOSE 128* 108*  BUN 13 13  CREATININE 0.86 0.75  CALCIUM 8.8* 8.3*   CBC:  Recent Labs  08/22/16 0649 08/23/16 0302  WBC 13.3* 9.5  HGB 12.1* 12.1*  HCT 36.8* 36.5*  MCV 90.2 90.8  PLT 203 184   Cardiac Enzymes:  Recent Labs  08/21/16 1937 08/22/16 0016 08/22/16 0649  TROPONINI 4.68* 29.83* 50.54*   Fasting Lipid Panel:  Recent Labs  08/21/16 1640  CHOL 190  HDL 58  LDLCALC 128*  TRIG 19  CHOLHDL 3.3    Current Meds: . aspirin  81 mg Oral Daily  . atorvastatin  80 mg Oral q1800  . brimonidine  1 drop Right Eye BID  . gatifloxacin  1 drop Right Eye QID  . latanoprost  1 drop Right Eye QHS  . metoprolol tartrate  25 mg Oral BID  . prednisoLONE acetate  1 drop Right Eye QID  . sodium chloride flush  3 mL Intravenous Q12H  . tamsulosin  0.4 mg Oral Daily  . ticagrelor  90 mg Oral BID  . timolol  1 drop Right Eye BID     ASSESSMENT AND PLAN:  1. CAD/Inferior STEMI: Pt admitted 08/21/16 with an inferior STEMI. The RCA was sub-totally occluded. He also has left main disease and  severe disease in the diagonal and mid Circumflex. We initially attempted balloon angioplasty of the RCA alone with planning for CABG today but he re-occluded his RCA and we placed a bare metal stent in the mid RCA. Pending CT surgery consult. Will plan medical management for one month with ASA/Brilinta/statin/low dose BB then will consider CABG.  BP too soft to add Ace-inh at this time.  Echo with normal LVF and G2DD. Repeat trop to make sure it has peaked.    Tonette Koehne  11/18/20171:26 PM

## 2016-08-23 NOTE — Progress Notes (Signed)
CARDIAC REHAB PHASE I   PRE:  Rate/Rhythm: 76  BP:  Sitting: 103/64     SaO2: 99 ra  MODE:  Ambulation: 800 ft   POST:  Rate/Rhythm: 80  BP:  Sitting: 113/59     SaO2: 100 ra  11:40am-12:05pm Patient ambulated at a swift pace independently. No complaints. Blood pressure great.   [place], MS 08/23/2016 11:59 AM

## 2016-08-23 NOTE — Discharge Instructions (Signed)
Do not miss a dose of Aspirin or Brilinta.

## 2016-08-25 ENCOUNTER — Telehealth: Payer: Self-pay | Admitting: *Deleted

## 2016-08-25 NOTE — Telephone Encounter (Signed)
-----   Message from Liliane Shi, [place] sent at 08/23/2016  3:17 PM EST ----- Regarding: Needs 7 day TCM follow up Patient:  Scott Jacobson [id number]  Primary Cardiologist:  Dr. Lauree Chandler  DC from hospital on 08/23/2016  Please arrange FU in 1 week with Dr. Lauree Chandler or PA/NP. This is a TCM appointment. Signed,  Richardson Dopp, PA-C   08/23/2016 3:17 PM

## 2016-08-25 NOTE — Telephone Encounter (Signed)
Patient contacted regarding discharge from [facility] on 08/23/16  Patient understands to follow up with provider-Yes B.Rosita Fire, [place] on November 27,2017 at 9:30 at [place] . Patient understands discharge instructions? Yes  Patient understands medications and regiment? Yes   Patient understands to bring all medications to this visit? Yes

## 2016-09-01 ENCOUNTER — Ambulatory Visit (INDEPENDENT_AMBULATORY_CARE_PROVIDER_SITE_OTHER): Payer: PRIVATE HEALTH INSURANCE | Admitting: Cardiology

## 2016-09-01 ENCOUNTER — Encounter: Payer: Self-pay | Admitting: Cardiology

## 2016-09-01 VITALS — BP 112/64 | HR 60 | Ht 69.0 in | Wt 186.4 lb

## 2016-09-01 DIAGNOSIS — I2111 ST elevation (STEMI) myocardial infarction involving right coronary artery: Secondary | ICD-10-CM

## 2016-09-01 DIAGNOSIS — I2510 Atherosclerotic heart disease of native coronary artery without angina pectoris: Secondary | ICD-10-CM | POA: Diagnosis not present

## 2016-09-01 DIAGNOSIS — I2109 ST elevation (STEMI) myocardial infarction involving other coronary artery of anterior wall: Secondary | ICD-10-CM

## 2016-09-01 NOTE — Progress Notes (Signed)
09/01/2016 Scott Jacobson   Sep 10, 1951  [id number]  Primary Physician Neale Burly, MD Primary Cardiologist: Dr. Angelena Form   Reason for Visit/CC: [facility] F/u for CAD s/p STEMI  HPI:  Scott Jacobson is a 65 y/o male who presents to clinic for post hospital f/u. He was admitted to [facility] on 08/21/16 with a STEMI. Emergent [place] revealed multivessel CAD with culprit vessel being a 99% occluded RCA treated with PCI utilizing a BMS. He was placed on DAPT with ASA + Brilinta. He also has severe residual CAD that will be treated surgically, per Dr. Cyndia Bent, after 4 weeks of DAPT for bare metal RCA stent. He has residual, moderately to severe distal left main stenosis, severe stenosis distal Circumflex and diagonal branch. Moderate stenosis of the mid Circumflex and OM as well as the proximal RCA. Echo revealed normal LVEF at 60-65%.   He presents to clinic with his wife. He reports that he has done well. No recurrent CP. No dyspnea. He has not required use of SL NTG. He reports full compliance with DAPT. He did have a nose bleed and went to [facility] and the bleeding stopped. Hgb was stable at 12.1 (unchnaged from discharge Hgb). He has had no further recurrence. He is also on high dose statin, Lipitor 80 mg. LDL was 128 mg/dL. Hgb A1c was WNL at 5.3. He was also placed on a BB. He denies any post cath complications. He is ambulating/o difficulty. He has f/u with Dr. Cyndia Bent arranged on 09/17/16 to discuss plans for CABG.    Current Meds  Medication Sig  . aspirin EC 81 MG tablet Take 1 tablet (81 mg total) by mouth daily.  [name] atorvastatin (LIPITOR) 80 MG tablet Take 1 tablet (80 mg total) by mouth daily at 6 PM.  . cetirizine (ZYRTEC) 10 MG tablet Take 10 mg by mouth daily.   . metoprolol tartrate (LOPRESSOR) 25 MG tablet Take 1 tablet (25 mg total) by mouth 2 (two) times daily.  . Multiple Vitamins-Minerals (ICAPS MV PO) Take 1 tablet by mouth 2 (two) times daily.   . nitroGLYCERIN (NITROSTAT)  0.4 MG SL tablet Place 1 tablet (0.4 mg total) under the tongue every 5 (five) minutes as needed for chest pain.  . pantoprazole (PROTONIX) 40 MG tablet Take 40 mg by mouth daily.  . tamsulosin (FLOMAX) 0.4 MG CAPS capsule Take 0.4 mg by mouth daily.  . ticagrelor (BRILINTA) 90 MG TABS tablet Take 1 tablet (90 mg total) by mouth 2 (two) times daily.   No Known Allergies Past Medical History:  Diagnosis Date  . Arthritis    "probably"  . BPH (benign prostatic hyperplasia)   . GERD (gastroesophageal reflux disease)   . Kidney stones   . Macular degeneration   . Seasonal allergies    Family History  Problem Relation Age of Onset  . Hypertension Mother   . Congestive Heart Failure Father    Past Surgical History:  Procedure Laterality Date  . APPENDECTOMY    . BACK SURGERY    . CARDIAC CATHETERIZATION N/A 08/21/2016   Procedure: Left Heart Cath and Coronary Angiography;  Surgeon: Burnell Blanks, MD;  Location: [place] [facility];  Service: Cardiovascular;  Laterality: N/A;  . CARDIAC CATHETERIZATION N/A 08/21/2016   Procedure: Coronary Stent Intervention;  Surgeon: Burnell Blanks, MD;  Location: [place] [facility];  Service: Cardiovascular;  Laterality: N/A;  . CATARACT EXTRACTION W/ INTRAOCULAR LENS  IMPLANT, BILATERAL Bilateral   .  COLONOSCOPY    . EYE SURGERY    . GAS INSERTION Right 02/05/2016   Procedure: INSERTION OF [id number];  Surgeon: Hayden Pedro, MD;  Location: [place];  Service: Ophthalmology;  Laterality: Right;  . HAND SURGERY Right    thumb; S/P "cut his arm"  . LASER PHOTO ABLATION Right 02/05/2016   Procedure: LASER PHOTO ABLATION-HEADSCOPE LASER;  Surgeon: Hayden Pedro, MD;  Location: [place];  Service: Ophthalmology;  Laterality: Right;  . LUMBAR DISC SURGERY  ~ 2002  . RETINAL DETACHMENT REPAIR W/ SCLERAL BUCKLE LE Right 02/05/2016  . SCLERAL BUCKLE Right 02/05/2016   Procedure: SCLERAL BUCKLE;  Surgeon: Hayden Pedro, MD;  Location: [place];   Service: Ophthalmology;  Laterality: Right;  . TONSILLECTOMY     Social History   Social History  . Marital status: Married    Spouse name: N/A  . Number of children: N/A  . Years of education: N/A   Occupational History  . Not on file.   Social History Main Topics  . Smoking status: Former Smoker    Packs/day: 3.00    Years: 12.00    Types: Cigarettes    Quit date: 02/04/1976  . Smokeless tobacco: Never Used  . Alcohol use Yes     Comment: 02/06/2016 "a beer a couple times/month"  . Drug use: No  . Sexual activity: Not Currently   Other Topics Concern  . Not on file   Social History Narrative  . No narrative on file     Review of Systems: General: negative for chills, fever, night sweats or weight changes.  Cardiovascular: negative for chest pain, dyspnea on exertion, edema, orthopnea, palpitations, paroxysmal nocturnal dyspnea or shortness of breath Dermatological: negative for rash Respiratory: negative for cough or wheezing Urologic: negative for hematuria Abdominal: negative for nausea, vomiting, diarrhea, bright red blood per rectum, melena, or hematemesis Neurologic: negative for visual changes, syncope, or dizziness All other systems reviewed and are otherwise negative except as noted above.   Physical Exam:  Blood pressure 112/64, pulse 60, height 5\' 9"  (1.753 m), weight 186 lb 6 oz (84.5 kg).  General appearance: alert, cooperative and no distress Neck: no carotid bruit and no JVD Lungs: clear to auscultation bilaterally Heart: regular rate and rhythm, S1, S2 normal, no murmur, click, rub or gallop Extremities: extremities normal, atraumatic, no cyanosis or edema Pulses: 2+ and symmetric Skin: Skin color, texture, turgor normal. No rashes or lesions Neurologic: Grossly normal  EKG NSR. 60 bpm   ASSESSMENT AND PLAN:   1. CAD: s/p STEMI 08/21/16. Emergent [place] revealed multivessel CAD with culprit vessel being a 99% occluded RCA treated with PCI utilizing  a BMS (bare metal stent chosen given need for CABG). He was placed on DAPT with ASA + Brilinta. He also has severe residual CAD that will be treated surgically, per Dr. Cyndia Bent, after 4 weeks of DAPT for bare metal RCA stent. He has residual, moderately to severe distal left main stenosis, severe stenosis distal Circumflex and diagonal branch. Moderate stenosis of the mid Circumflex and OM as well as the proximal RCA. Echo revealed normal LVEF at 60-65%. He is stable w/o recurrent CP. Continue DAPT with ASA + Brilinta, + high intensity statin and BB. Keep f/u with Dr. Cyndia Bent 09/17/16 to discuss CABG.  2. HLD: LDL 128 mg/dL. Goal LDL is <70 mg/dL. Continue high intensity statin therapy with Lipitor. Recheck FLP + HFTs in 6 weeks.     Scott Jester PA-C 09/01/2016 10:14  AM

## 2016-09-01 NOTE — Patient Instructions (Addendum)
Medication Instructions:  Your physician recommends that you continue on your current medications as directed. Please refer to the Current Medication list given to you today.   Labwork: None ordered  Testing/Procedures: None ordered  Follow-Up: Your physician recommends that you schedule a follow-up appointment AROUND THE MIDDLE - THE END OF January, [place], PA-C   Any Other Special Instructions Will Be Listed Below (If Applicable).     If you need a refill on your cardiac medications before your next appointment, please call your pharmacy.

## 2016-09-17 ENCOUNTER — Ambulatory Visit (INDEPENDENT_AMBULATORY_CARE_PROVIDER_SITE_OTHER): Payer: PRIVATE HEALTH INSURANCE | Admitting: Surgery

## 2016-09-17 ENCOUNTER — Encounter: Payer: Self-pay | Admitting: Surgery

## 2016-09-17 ENCOUNTER — Other Ambulatory Visit: Payer: Self-pay | Admitting: *Deleted

## 2016-09-17 VITALS — BP 114/69 | HR 61 | Resp 20 | Ht 69.0 in | Wt 186.0 lb

## 2016-09-17 DIAGNOSIS — I2510 Atherosclerotic heart disease of native coronary artery without angina pectoris: Secondary | ICD-10-CM

## 2016-09-18 ENCOUNTER — Encounter: Payer: Self-pay | Admitting: Surgery

## 2016-09-18 NOTE — Progress Notes (Signed)
     HPI:  Scott Jacobson returns today for follow up after PCI and BMS on 08/21/2016 for a subtotally occluded mid RCA in the setting of acute inferior STEMI. His cath also showed 70% ostial and distal LM, 99% OM2, and 99% diagnonal stenoses. It was felt that he should remain on DAPT for one month and then proceed with CABG. His troponin went up to 50 but his LVEF was normal the next day on echo. He has done well at home. He says that he does get some chest pressure if he is out walking in the cold weather but has none at home with normal activity.   Current Outpatient Prescriptions  Medication Sig Dispense Refill  . aspirin EC 81 MG tablet Take 1 tablet (81 mg total) by mouth daily.    [name] atorvastatin (LIPITOR) 80 MG tablet Take 1 tablet (80 mg total) by mouth daily at 6 PM. 30 tablet 11  . cetirizine (ZYRTEC) 10 MG tablet Take 10 mg by mouth daily.     . metoprolol tartrate (LOPRESSOR) 25 MG tablet Take 1 tablet (25 mg total) by mouth 2 (two) times daily. 60 tablet 11  . Multiple Vitamins-Minerals (ICAPS MV PO) Take 1 tablet by mouth 2 (two) times daily.     . nitroGLYCERIN (NITROSTAT) 0.4 MG SL tablet Place 1 tablet (0.4 mg total) under the tongue every 5 (five) minutes as needed for chest pain. 25 tablet 12  . pantoprazole (PROTONIX) 40 MG tablet Take 40 mg by mouth daily.    . tamsulosin (FLOMAX) 0.4 MG CAPS capsule Take 0.4 mg by mouth daily.    . ticagrelor (BRILINTA) 90 MG TABS tablet Take 1 tablet (90 mg total) by mouth 2 (two) times daily. 60 tablet 11   No current facility-administered medications for this visit.      Physical Exam: BP 114/69   Pulse 61   Resp 20   Ht 5\' 9"  (1.753 m)   Wt 186 lb (84.4 kg)   SpO2 98% Comment: RA  BMI 27.47 kg/m  He looks well Cardiac exam shows a regular rate and rhythm with normal heart sounds and no murmurs. Lungs are clear There is no peripheral edema.  Diagnostic Tests:  None today   Impression:  He has high grade left main and  severe 3 vessel CAD s/p inferior STEMI treated with emergent PCI with a BMS to the mid RCA. He is about one month out and would like to proceed with CABG before the end of the year. I discussed the operative procedure with the patient and his wifeincluding alternatives, benefits and risks; including but not limited to bleeding, blood transfusion, infection, stroke, myocardial infarction, graft failure, heart block requiring a permanent pacemaker, organ dysfunction, and death.  Scott Jacobson understands and agrees to proceed.    Plan:  CABG on 10/03/2016. He will stop the Brilinta 5 days preop.   Gaye Pollack, MD [facility] [phone]

## 2016-09-30 NOTE — Pre-Procedure Instructions (Addendum)
Scott Jacobson  09/30/2016      [facility] - [place], [place] [address] Phone: [phone] Fax: [phone]  [id number] - [place], [place] [address] [place] [address] Phone: [phone] Fax: [phone]    Your procedure is scheduled on  Fri., Dec. 29  Report to [facility] at 5:30 A.M.  Call this number if you have problems the morning of surgery:  [phone]   Remember:  Do not eat food or drink liquids after midnight on Thurs. Dec. 28   Take these medicines the morning of surgery with A SIP OF WATER : metoprolol tartrate(lopressor),nitroglycerine if needed, pantoprazole(protonix), tamsulosin(flomax)              1 week prior to surgery stop advil, motrin, ibuprofen, aleve, vitamins/herbal medicine.               Stop brilinta 5 days prior to surgery.   Do not wear jewelry.  Do not wear lotions, powders, or cologne, or deoderant.  Do not shave 48 hours prior to surgery.  Men may shave face and neck.  Do not bring valuables to the hospital.  [facility] is not responsible for any belongings or valuables.  Contacts, dentures or bridgework may not be worn into surgery.  Leave your suitcase in the car.  After surgery it may be brought to your room.  For patients admitted to the hospital, discharge time will be determined by your treatment team.  Patients discharged the day of surgery will not be allowed to drive home.    Special instructions:   [facility]- Preparing For Surgery  Before surgery, you can play an important role. Because skin is not sterile, your skin needs to be as free of germs as possible. You can reduce the number of germs on your skin by washing with CHG (chlorahexidine gluconate) Soap before surgery.  CHG is an antiseptic cleaner which kills germs and bonds with the skin to continue killing germs even after  washing.  Please do not use if you have an allergy to CHG or antibacterial soaps. If your skin becomes reddened/irritated stop using the CHG.  Do not shave (including legs and underarms) for at least 48 hours prior to first CHG shower. It is OK to shave your face.  Please follow these instructions carefully.   1. Shower the NIGHT BEFORE SURGERY and the MORNING OF SURGERY with CHG.   2. If you chose to wash your hair, wash your hair first as usual with your normal shampoo.  3. After you shampoo, rinse your hair and body thoroughly to remove the shampoo.  4. Use CHG as you would any other liquid soap. You can apply CHG directly to the skin and wash gently with a scrungie or a clean washcloth.   5. Apply the CHG Soap to your body ONLY FROM THE NECK DOWN.  Do not use on open wounds or open sores. Avoid contact with your eyes, ears, mouth and genitals (private parts). Wash genitals (private parts) with your normal soap.  6. Wash thoroughly, paying special attention to the area where your surgery will be performed.  7. Thoroughly rinse your body with warm water from the neck down.  8. DO NOT shower/wash with your normal soap after using and rinsing off the CHG Soap.  9. [place]  yourself dry with a CLEAN TOWEL.   10. Wear CLEAN PAJAMAS   11. Place CLEAN SHEETS on your bed the night of your first shower and DO NOT SLEEP WITH PETS.    Day of Surgery: Do not apply any deodorants/lotions. Please wear clean clothes to the hospital/surgery center.      Please read over the following fact sheets that you were given. Coughing and Deep Breathing, MRSA Information and Surgical Site Infection Prevention

## 2016-10-01 ENCOUNTER — Encounter (HOSPITAL_COMMUNITY): Payer: Self-pay

## 2016-10-01 ENCOUNTER — Ambulatory Visit (HOSPITAL_COMMUNITY)
Admission: RE | Admit: 2016-10-01 | Discharge: 2016-10-01 | Disposition: A | Discharge status: Home / Self Care | Payer: PRIVATE HEALTH INSURANCE | Source: Ambulatory Visit | Attending: Surgery | Admitting: Surgery

## 2016-10-01 ENCOUNTER — Ambulatory Visit (HOSPITAL_BASED_OUTPATIENT_CLINIC_OR_DEPARTMENT_OTHER)
Admission: RE | Admit: 2016-10-01 | Discharge: 2016-10-01 | Disposition: A | Discharge status: Home / Self Care | Payer: PRIVATE HEALTH INSURANCE | Source: Ambulatory Visit | Attending: Surgery | Admitting: Surgery

## 2016-10-01 ENCOUNTER — Other Ambulatory Visit: Payer: Self-pay

## 2016-10-01 ENCOUNTER — Ambulatory Visit: Payer: PRIVATE HEALTH INSURANCE | Admitting: Surgery

## 2016-10-01 ENCOUNTER — Encounter (HOSPITAL_COMMUNITY)
Admission: RE | Admit: 2016-10-01 | Discharge: 2016-10-01 | Disposition: A | Discharge status: Home / Self Care | Payer: PRIVATE HEALTH INSURANCE | Source: Ambulatory Visit | Attending: Surgery | Admitting: Surgery

## 2016-10-01 DIAGNOSIS — I2510 Atherosclerotic heart disease of native coronary artery without angina pectoris: Secondary | ICD-10-CM | POA: Diagnosis not present

## 2016-10-01 DIAGNOSIS — R942 Abnormal results of pulmonary function studies: Secondary | ICD-10-CM | POA: Diagnosis not present

## 2016-10-01 DIAGNOSIS — R001 Bradycardia, unspecified: Secondary | ICD-10-CM | POA: Insufficient documentation

## 2016-10-01 DIAGNOSIS — R9431 Abnormal electrocardiogram [ECG] [EKG]: Secondary | ICD-10-CM | POA: Insufficient documentation

## 2016-10-01 DIAGNOSIS — Z01810 Encounter for preprocedural cardiovascular examination: Secondary | ICD-10-CM

## 2016-10-01 DIAGNOSIS — I6523 Occlusion and stenosis of bilateral carotid arteries: Secondary | ICD-10-CM | POA: Insufficient documentation

## 2016-10-01 DIAGNOSIS — Z87891 Personal history of nicotine dependence: Secondary | ICD-10-CM | POA: Insufficient documentation

## 2016-10-01 DIAGNOSIS — J449 Chronic obstructive pulmonary disease, unspecified: Secondary | ICD-10-CM | POA: Diagnosis not present

## 2016-10-01 DIAGNOSIS — Z01818 Encounter for other preprocedural examination: Secondary | ICD-10-CM | POA: Insufficient documentation

## 2016-10-01 HISTORY — DX: Atherosclerotic heart disease of native coronary artery without angina pectoris: I25.10

## 2016-10-01 HISTORY — DX: Personal history of urinary calculi: Z87.442

## 2016-10-01 HISTORY — DX: Essential (primary) hypertension: I10

## 2016-10-01 HISTORY — DX: Dyspnea, unspecified: R06.00

## 2016-10-01 HISTORY — DX: Angina pectoris, unspecified: I20.9

## 2016-10-01 HISTORY — DX: Acute myocardial infarction, unspecified: I21.9

## 2016-10-01 LAB — PULMONARY FUNCTION TEST
DL/VA % PRED: 99 %
DL/VA: 4.54 ml/min/mmHg/L
DLCO UNC % PRED: 71 %
DLCO unc: 22.13 ml/min/mmHg
FEF 25-75 PRE: 2.19 L/s
FEF 25-75 Post: 3.08 L/sec
FEF2575-%CHANGE-POST: 40 %
FEF2575-%PRED-POST: 120 %
FEF2575-%PRED-PRE: 85 %
FEV1-%Change-Post: 9 %
FEV1-%Pred-Post: 87 %
FEV1-%Pred-Pre: 79 %
FEV1-PRE: 2.6 L
FEV1-Post: 2.86 L
FEV1FVC-%CHANGE-POST: 3 %
FEV1FVC-%Pred-Pre: 101 %
FEV6-%CHANGE-POST: 7 %
FEV6-%PRED-PRE: 82 %
FEV6-%Pred-Post: 88 %
FEV6-PRE: 3.42 L
FEV6-Post: 3.66 L
FEV6FVC-%Change-Post: 0 %
FEV6FVC-%Pred-Post: 104 %
FEV6FVC-%Pred-Pre: 104 %
FVC-%CHANGE-POST: 6 %
FVC-%PRED-POST: 83 %
FVC-%PRED-PRE: 78 %
FVC-POST: 3.67 L
FVC-PRE: 3.45 L
POST FEV1/FVC RATIO: 78 %
POST FEV6/FVC RATIO: 100 %
PRE FEV1/FVC RATIO: 75 %
Pre FEV6/FVC Ratio: 99 %
RV % pred: 95 %
RV: 2.22 L
TLC % pred: 86 %
TLC: 5.9 L

## 2016-10-01 LAB — VAS US DOPPLER PRE CABG
LCCADDIAS: -27 cm/s
LEFT ECA DIAS: -23 cm/s
LEFT VERTEBRAL DIAS: -6 cm/s
LICADSYS: -68 cm/s
Left CCA dist sys: -111 cm/s
Left CCA prox dias: 15 cm/s
Left CCA prox sys: 93 cm/s
Left ICA dist dias: -26 cm/s
Left ICA prox dias: -30 cm/s
Left ICA prox sys: -95 cm/s
RCCADSYS: -87 cm/s
RIGHT ECA DIAS: -26 cm/s
RIGHT VERTEBRAL DIAS: 13 cm/s
Right CCA prox dias: 23 cm/s
Right CCA prox sys: 96 cm/s

## 2016-10-01 LAB — COMPREHENSIVE METABOLIC PANEL
ALK PHOS: 84 U/L (ref 38–126)
ALT: 22 U/L (ref 17–63)
AST: 24 U/L (ref 15–41)
Albumin: 3.9 g/dL (ref 3.5–5.0)
Anion gap: 8 (ref 5–15)
BUN: 13 mg/dL (ref 6–20)
CALCIUM: 8.8 mg/dL — AB (ref 8.9–10.3)
CHLORIDE: 107 mmol/L (ref 101–111)
CO2: 22 mmol/L (ref 22–32)
CREATININE: 0.78 mg/dL (ref 0.61–1.24)
GFR calc non Af Amer: >60 mL/min (ref >60)
Glucose, Bld: 84 mg/dL (ref 65–99)
Potassium: 3.6 mmol/L (ref 3.5–5.1)
SODIUM: 137 mmol/L (ref 135–145)
Total Bilirubin: 0.8 mg/dL (ref 0.3–1.2)
Total Protein: 6.6 g/dL (ref 6.5–8.1)

## 2016-10-01 LAB — BLOOD GAS, ARTERIAL
ACID-BASE EXCESS: 0.1 mmol/L (ref 0.0–2.0)
Bicarbonate: 23.6 mmol/L (ref 20.0–28.0)
DRAWN BY: 421801
FIO2: 0.21
O2 SAT: 97.8 %
PCO2 ART: 34.1 mmHg (ref 32.0–48.0)
PH ART: 7.454 — AB (ref 7.350–7.450)
Patient temperature: 98.6
pO2, Arterial: 99.7 mmHg (ref 83.0–108.0)

## 2016-10-01 LAB — URINALYSIS, ROUTINE W REFLEX MICROSCOPIC
BILIRUBIN URINE: NEGATIVE
Glucose, UA: NEGATIVE mg/dL
Hgb urine dipstick: NEGATIVE
KETONES UR: NEGATIVE mg/dL
LEUKOCYTES UA: NEGATIVE
NITRITE: NEGATIVE
PROTEIN: NEGATIVE mg/dL
Specific Gravity, Urine: 1.019 (ref 1.005–1.030)
pH: 6 (ref 5.0–8.0)

## 2016-10-01 LAB — TYPE AND SCREEN
ABO/RH(D): A NEG
Antibody Screen: NEGATIVE
WEAK D: POSITIVE

## 2016-10-01 LAB — SURGICAL PCR SCREEN
MRSA, PCR: NEGATIVE
Staphylococcus aureus: NEGATIVE

## 2016-10-01 LAB — CBC
HCT: 40.9 % (ref 39.0–52.0)
HEMOGLOBIN: 13.9 g/dL (ref 13.0–17.0)
MCH: 30.2 pg (ref 26.0–34.0)
MCHC: 34 g/dL (ref 30.0–36.0)
MCV: 88.7 fL (ref 78.0–100.0)
PLATELETS: 201 10*3/uL (ref 150–400)
RBC: 4.61 MIL/uL (ref 4.22–5.81)
RDW: 13.4 % (ref 11.5–15.5)
WBC: 7.4 10*3/uL (ref 4.0–10.5)

## 2016-10-01 LAB — PROTIME-INR
INR: 0.99
Prothrombin Time: 13.1 seconds (ref 11.4–15.2)

## 2016-10-01 LAB — APTT: aPTT: 34 seconds (ref 24–36)

## 2016-10-01 MED ORDER — ALBUTEROL SULFATE (2.5 MG/3ML) 0.083% IN NEBU
2.5000 mg | INHALATION_SOLUTION | Freq: Once | RESPIRATORY_TRACT | Status: AC
  Administered 2016-10-01: 2.5 mg via RESPIRATORY_TRACT

## 2016-10-01 NOTE — Progress Notes (Signed)
Pre-op Cardiac Surgery  Carotid Findings:  Right:  40-59% internal carotid artery stenosis. Left : 1-39% ICA stenosis. Bilateral - Vertebral artery flow is antegrade.  Upper Extremity Right Left  Brachial Pressures 123  Triphasic 113 Triphasic  Radial Waveforms Triphasic Triphasic  Ulnar Waveforms Triphasic Triphasic  Palmar Arch (Allen's Test) Normal Normal   Findings: Doppler waveforms remained normal bilaterally with both radial and ulnar compressions.     Lower  Extremity Right Left  Anterior Tibial 159 Triphasic 138 Biphasic  Posterior Tibial 159 Triphasic 161 Triphasic  Ankle/Brachial Indices 1.29 1.21    Findings:  ABIs and Doppler waveforms are within normal limits bilaterally at rest

## 2016-10-01 NOTE — Progress Notes (Addendum)
PCP - Stoney Bang Cardiologist - [place]  Chest x-ray - 10/01/16 EKG - 10/01/16 Stress Test - denies ECHO - 08/22/16 Cardiac Cath - 08/21/16  Will send to anesthesia for review of heart hx  Last dose of Birlinta on 12/23   Patient denies shortness of breath, fever, cough and chest pain at PAT appointment

## 2016-10-02 LAB — ABO/RH: ABO/RH(D): A NEG

## 2016-10-02 MED ORDER — DOPAMINE-DEXTROSE 3.2-5 MG/ML-% IV SOLN
0.0000 ug/kg/min | INTRAVENOUS | Status: DC
  Filled 2016-10-02: qty 250

## 2016-10-02 MED ORDER — DEXTROSE 5 % IV SOLN
1.5000 g | INTRAVENOUS | Status: AC
  Administered 2016-10-03: .75 g via INTRAVENOUS
  Administered 2016-10-03: 1.5 g via INTRAVENOUS
  Filled 2016-10-02: qty 1.5

## 2016-10-02 MED ORDER — SODIUM CHLORIDE 0.9 % IV SOLN
1.5000 mg/kg/h | INTRAVENOUS | Status: AC
  Administered 2016-10-03: 1.5 mg/kg/h via INTRAVENOUS
  Filled 2016-10-02: qty 25

## 2016-10-02 MED ORDER — NITROGLYCERIN IN D5W 200-5 MCG/ML-% IV SOLN
2.0000 ug/min | INTRAVENOUS | Status: AC
  Administered 2016-10-03: 5 ug/min via INTRAVENOUS
  Filled 2016-10-02: qty 250

## 2016-10-02 MED ORDER — CEFUROXIME SODIUM 750 MG IJ SOLR
750.0000 mg | INTRAMUSCULAR | Status: DC
  Filled 2016-10-02: qty 750

## 2016-10-02 MED ORDER — DEXMEDETOMIDINE HCL IN NACL 400 MCG/100ML IV SOLN
0.1000 ug/kg/h | INTRAVENOUS | Status: AC
  Administered 2016-10-03: .5 ug/kg/h via INTRAVENOUS
  Filled 2016-10-02: qty 100

## 2016-10-02 MED ORDER — POTASSIUM CHLORIDE 2 MEQ/ML IV SOLN
80.0000 meq | INTRAVENOUS | Status: DC
  Filled 2016-10-02: qty 40

## 2016-10-02 MED ORDER — SODIUM CHLORIDE 0.9 % IV SOLN
INTRAVENOUS | Status: DC
  Filled 2016-10-02: qty 30

## 2016-10-02 MED ORDER — EPINEPHRINE PF 1 MG/ML IJ SOLN
0.0000 ug/min | INTRAMUSCULAR | Status: DC
  Filled 2016-10-02: qty 4

## 2016-10-02 MED ORDER — TRANEXAMIC ACID (OHS) BOLUS VIA INFUSION
15.0000 mg/kg | INTRAVENOUS | Status: AC
  Administered 2016-10-03: 1260 mg via INTRAVENOUS
  Filled 2016-10-02: qty 1260

## 2016-10-02 MED ORDER — VANCOMYCIN HCL 10 G IV SOLR
1500.0000 mg | INTRAVENOUS | Status: AC
  Administered 2016-10-03: 1500 mg via INTRAVENOUS
  Filled 2016-10-02: qty 1500

## 2016-10-02 MED ORDER — SODIUM CHLORIDE 0.9 % IV SOLN
INTRAVENOUS | Status: AC
  Administered 2016-10-03: 1.2 [IU]/h via INTRAVENOUS
  Filled 2016-10-02: qty 2.5

## 2016-10-02 MED ORDER — PLASMA-LYTE 148 IV SOLN
INTRAVENOUS | Status: AC
  Administered 2016-10-03: 500 mL
  Filled 2016-10-02: qty 2.5

## 2016-10-02 MED ORDER — CHLORHEXIDINE GLUCONATE 0.12 % MT SOLN
15.0000 mL | Freq: Once | OROMUCOSAL | Status: DC
Start: 2016-10-03 — End: 2016-10-03
  Filled 2016-10-02: qty 15

## 2016-10-02 MED ORDER — PHENYLEPHRINE HCL 10 MG/ML IJ SOLN
30.0000 ug/min | INTRAVENOUS | Status: DC
  Filled 2016-10-02: qty 2

## 2016-10-02 MED ORDER — MAGNESIUM SULFATE 50 % IJ SOLN
40.0000 meq | INTRAMUSCULAR | Status: DC
  Filled 2016-10-02: qty 10

## 2016-10-02 MED ORDER — TRANEXAMIC ACID (OHS) PUMP PRIME SOLUTION
2.0000 mg/kg | INTRAVENOUS | Status: DC
  Filled 2016-10-02: qty 1.68

## 2016-10-02 MED ORDER — METOPROLOL TARTRATE 12.5 MG HALF TABLET
12.5000 mg | ORAL_TABLET | Freq: Once | ORAL | Status: DC
Start: 2016-10-03 — End: 2016-10-03

## 2016-10-02 NOTE — Progress Notes (Signed)
Anesthesia chart review: Patient is a 65 year old male scheduled for CABG on 10/03/2016 by Dr. Cyndia Bent.  History includes former smoker, CAD, inferior STEMI 08/21/16 s/p BMS to mid RCA , hypertension, macular degeneration, GERD, arthritis, nephrolithiasis, dyspnea, tonsillectomy, appendectomy, back surgery.  PCP is Dr. Stoney Bang. Cardiologist is Dr. Lauree Chandler.  Meds include aspirin 81 mg, Lipitor, Zyrtec, Lopressor, nitroglycerin, Protonix, Flomax, Brilinta. Last Brilinta dose 09/27/16.  BP (!) 144/68   Pulse (!) 54   Temp 36.6 C   Resp 20   Ht 5\' 9"  (1.753 m)   Wt 185 lb 3.2 oz (84 kg)   SpO2 100%   BMI 27.35 kg/m    Echo 08/22/16: Study Conclusions - Left ventricle: The cavity size was normal. Systolic function was   normal. The estimated ejection fraction was in the range of 60%   to 65%. Wall motion was normal; there were no regional wall   motion abnormalities. Features are consistent with a pseudonormal   left ventricular filling pattern, with concomitant abnormal   relaxation and increased filling pressure (grade 2 diastolic   dysfunction). - Left atrium: The atrium was moderately dilated.  Cardiac Cath/PCI 08/21/16:  There is mild left ventricular systolic dysfunction.  LV end diastolic pressure is mildly elevated.  The left ventricular ejection fraction is 45-50% by visual estimate.  There is no mitral valve regurgitation.  Prox RCA lesion, 30 %stenosed.  Prox RCA to Mid RCA lesion, 50 %stenosed.  Ost LM to LM lesion, 70 %stenosed.  Ost 2nd Mrg to 2nd Mrg lesion, 60 %stenosed.  Mid Cx-2 lesion, 99 %stenosed.  Mid Cx-1 lesion, 60 %stenosed.  1st Diag lesion, 99 %stenosed.  Ost LAD to Prox LAD lesion, 30 %stenosed.  A STENT VISION RX [id number] bare metal stent was successfully placed.  Mid RCA lesion, 99 %stenosed.  Post intervention, there is a 0% residual stenosis. 1. Acute inferior STEMI secondary to sub-totally occluded mid RCA 2.  Successful PTCA/bare metal stent x 1 mid RCA 3. Residual moderately severe distal left main stenosis, severe stenosis distal Circumflex and diagonal branch. Moderate stenosis mid Circumflex and OM as well as the proximal RCA 4. Mild LV systolic dysfunction Recommendations: Will plan to continue ASA and Brilinta for one month along with statin. Will have CT surgery see him tomorrow. As described in the cath note, we had considered angioplasty alone of the RCA and then CABG tomorrow. After the initial angioplasty of the RCA after we had broken down the sterile field, he had recurrence of chest pain. Re-look cath with recurrence of thrombotic lesion in the RCA. We elected to place a bare metal stent in the RCA and treat him medically for one month then consider CABG.  Carotid U/S 09/2716: Right:  40-59% internal carotid artery stenosis. Left : 1-39% ICA stenosis. Bilateral - Vertebral artery flow is antegrade.  Preoperative EKG, chest x-ray, PFTs, and labs noted. A1c is in process, but on 08/21/16 was 5.3%.  If no acute changes then I anticipate that he can proceed as planned.   George Hugh [facility] Short Stay Center/Anesthesiology Phone [phone] 10/02/2016 10:25 AM

## 2016-10-03 ENCOUNTER — Inpatient Hospital Stay (HOSPITAL_COMMUNITY): Payer: PRIVATE HEALTH INSURANCE

## 2016-10-03 ENCOUNTER — Encounter (HOSPITAL_COMMUNITY): Payer: Self-pay | Admitting: *Deleted

## 2016-10-03 ENCOUNTER — Inpatient Hospital Stay (HOSPITAL_COMMUNITY): Payer: PRIVATE HEALTH INSURANCE | Admitting: Vascular Surgery

## 2016-10-03 ENCOUNTER — Inpatient Hospital Stay (HOSPITAL_COMMUNITY)
Admission: RE | Admit: 2016-10-03 | Discharge: 2016-10-08 | DRG: 236 | Disposition: A | Discharge status: Home / Self Care | Payer: PRIVATE HEALTH INSURANCE | Source: Ambulatory Visit | Attending: Surgery | Admitting: Surgery

## 2016-10-03 ENCOUNTER — Inpatient Hospital Stay (HOSPITAL_COMMUNITY): Payer: PRIVATE HEALTH INSURANCE | Admitting: Anesthesiology

## 2016-10-03 ENCOUNTER — Encounter (HOSPITAL_COMMUNITY)
Admission: RE | Disposition: A | Discharge status: Home / Self Care | Payer: Self-pay | Source: Ambulatory Visit | Attending: Surgery

## 2016-10-03 DIAGNOSIS — K219 Gastro-esophageal reflux disease without esophagitis: Secondary | ICD-10-CM | POA: Diagnosis present

## 2016-10-03 DIAGNOSIS — I2510 Atherosclerotic heart disease of native coronary artery without angina pectoris: Secondary | ICD-10-CM | POA: Diagnosis present

## 2016-10-03 DIAGNOSIS — Z87891 Personal history of nicotine dependence: Secondary | ICD-10-CM | POA: Diagnosis not present

## 2016-10-03 DIAGNOSIS — Z7982 Long term (current) use of aspirin: Secondary | ICD-10-CM

## 2016-10-03 DIAGNOSIS — I252 Old myocardial infarction: Secondary | ICD-10-CM

## 2016-10-03 DIAGNOSIS — J90 Pleural effusion, not elsewhere classified: Secondary | ICD-10-CM | POA: Diagnosis not present

## 2016-10-03 DIAGNOSIS — J302 Other seasonal allergic rhinitis: Secondary | ICD-10-CM | POA: Diagnosis present

## 2016-10-03 DIAGNOSIS — J9811 Atelectasis: Secondary | ICD-10-CM | POA: Diagnosis not present

## 2016-10-03 DIAGNOSIS — Z79899 Other long term (current) drug therapy: Secondary | ICD-10-CM | POA: Diagnosis not present

## 2016-10-03 DIAGNOSIS — E8770 Fluid overload, unspecified: Secondary | ICD-10-CM | POA: Diagnosis not present

## 2016-10-03 DIAGNOSIS — D62 Acute posthemorrhagic anemia: Secondary | ICD-10-CM | POA: Diagnosis not present

## 2016-10-03 DIAGNOSIS — I10 Essential (primary) hypertension: Secondary | ICD-10-CM | POA: Diagnosis present

## 2016-10-03 DIAGNOSIS — H3530 Unspecified macular degeneration: Secondary | ICD-10-CM | POA: Diagnosis present

## 2016-10-03 DIAGNOSIS — Z955 Presence of coronary angioplasty implant and graft: Secondary | ICD-10-CM

## 2016-10-03 DIAGNOSIS — R079 Chest pain, unspecified: Secondary | ICD-10-CM | POA: Diagnosis not present

## 2016-10-03 DIAGNOSIS — I493 Ventricular premature depolarization: Secondary | ICD-10-CM | POA: Diagnosis present

## 2016-10-03 DIAGNOSIS — R0602 Shortness of breath: Secondary | ICD-10-CM | POA: Diagnosis not present

## 2016-10-03 DIAGNOSIS — I25110 Atherosclerotic heart disease of native coronary artery with unstable angina pectoris: Secondary | ICD-10-CM

## 2016-10-03 DIAGNOSIS — Z951 Presence of aortocoronary bypass graft: Secondary | ICD-10-CM

## 2016-10-03 DIAGNOSIS — N400 Enlarged prostate without lower urinary tract symptoms: Secondary | ICD-10-CM | POA: Diagnosis present

## 2016-10-03 DIAGNOSIS — Z8249 Family history of ischemic heart disease and other diseases of the circulatory system: Secondary | ICD-10-CM

## 2016-10-03 DIAGNOSIS — Z87442 Personal history of urinary calculi: Secondary | ICD-10-CM | POA: Diagnosis not present

## 2016-10-03 DIAGNOSIS — Z9889 Other specified postprocedural states: Secondary | ICD-10-CM | POA: Diagnosis not present

## 2016-10-03 HISTORY — PX: TEE WITHOUT CARDIOVERSION: SHX5443

## 2016-10-03 HISTORY — PX: CORONARY ARTERY BYPASS GRAFT: SHX141

## 2016-10-03 LAB — POCT I-STAT, CHEM 8
BUN: 19 mg/dL (ref 6–20)
BUN: 16 mg/dL (ref 6–20)
BUN: 18 mg/dL (ref 6–20)
BUN: 19 mg/dL (ref 6–20)
BUN: 15 mg/dL (ref 6–20)
BUN: 15 mg/dL (ref 6–20)
BUN: 12 mg/dL (ref 6–20)
CALCIUM ION: 1.2 mmol/L (ref 1.15–1.40)
CALCIUM ION: 1.22 mmol/L (ref 1.15–1.40)
CHLORIDE: 105 mmol/L (ref 101–111)
CHLORIDE: 105 mmol/L (ref 101–111)
CHLORIDE: 102 mmol/L (ref 101–111)
CHLORIDE: 106 mmol/L (ref 101–111)
CREATININE: 0.5 mg/dL — AB (ref 0.61–1.24)
CREATININE: 0.6 mg/dL — AB (ref 0.61–1.24)
CREATININE: 0.6 mg/dL — AB (ref 0.61–1.24)
Calcium, Ion: 1.24 mmol/L (ref 1.15–1.40)
Calcium, Ion: 1.01 mmol/L — ABNORMAL LOW (ref 1.15–1.40)
Calcium, Ion: 1.09 mmol/L — ABNORMAL LOW (ref 1.15–1.40)
Calcium, Ion: 1.05 mmol/L — ABNORMAL LOW (ref 1.15–1.40)
Calcium, Ion: 1.1 mmol/L — ABNORMAL LOW (ref 1.15–1.40)
Chloride: 101 mmol/L (ref 101–111)
Chloride: 105 mmol/L (ref 101–111)
Chloride: 103 mmol/L (ref 101–111)
Creatinine, Ser: 0.6 mg/dL — ABNORMAL LOW (ref 0.61–1.24)
Creatinine, Ser: 0.5 mg/dL — ABNORMAL LOW (ref 0.61–1.24)
Creatinine, Ser: 0.5 mg/dL — ABNORMAL LOW (ref 0.61–1.24)
Creatinine, Ser: 0.6 mg/dL — ABNORMAL LOW (ref 0.61–1.24)
GLUCOSE: 142 mg/dL — AB (ref 65–99)
GLUCOSE: 147 mg/dL — AB (ref 65–99)
GLUCOSE: 139 mg/dL — AB (ref 65–99)
Glucose, Bld: 122 mg/dL — ABNORMAL HIGH (ref 65–99)
Glucose, Bld: 143 mg/dL — ABNORMAL HIGH (ref 65–99)
Glucose, Bld: 161 mg/dL — ABNORMAL HIGH (ref 65–99)
Glucose, Bld: 132 mg/dL — ABNORMAL HIGH (ref 65–99)
HCT: 26 % — ABNORMAL LOW (ref 39.0–52.0)
HCT: 34 % — ABNORMAL LOW (ref 39.0–52.0)
HEMATOCRIT: 34 % — AB (ref 39.0–52.0)
HEMATOCRIT: 32 % — AB (ref 39.0–52.0)
HEMATOCRIT: 27 % — AB (ref 39.0–52.0)
HEMATOCRIT: 26 % — AB (ref 39.0–52.0)
HEMATOCRIT: 28 % — AB (ref 39.0–52.0)
HEMOGLOBIN: 11.6 g/dL — AB (ref 13.0–17.0)
HEMOGLOBIN: 10.9 g/dL — AB (ref 13.0–17.0)
HEMOGLOBIN: 8.8 g/dL — AB (ref 13.0–17.0)
HEMOGLOBIN: 9.2 g/dL — AB (ref 13.0–17.0)
HEMOGLOBIN: 8.8 g/dL — AB (ref 13.0–17.0)
HEMOGLOBIN: 9.5 g/dL — AB (ref 13.0–17.0)
Hemoglobin: 11.6 g/dL — ABNORMAL LOW (ref 13.0–17.0)
POTASSIUM: 4.1 mmol/L (ref 3.5–5.1)
POTASSIUM: 4.5 mmol/L (ref 3.5–5.1)
POTASSIUM: 4.3 mmol/L (ref 3.5–5.1)
POTASSIUM: 4.2 mmol/L (ref 3.5–5.1)
POTASSIUM: 4.1 mmol/L (ref 3.5–5.1)
Potassium: 4.1 mmol/L (ref 3.5–5.1)
Potassium: 4.3 mmol/L (ref 3.5–5.1)
SODIUM: 142 mmol/L (ref 135–145)
SODIUM: 141 mmol/L (ref 135–145)
SODIUM: 141 mmol/L (ref 135–145)
SODIUM: 140 mmol/L (ref 135–145)
SODIUM: 140 mmol/L (ref 135–145)
Sodium: 139 mmol/L (ref 135–145)
Sodium: 141 mmol/L (ref 135–145)
TCO2: 28 mmol/L (ref 0–100)
TCO2: 26 mmol/L (ref 0–100)
TCO2: 26 mmol/L (ref 0–100)
TCO2: 27 mmol/L (ref 0–100)
TCO2: 25 mmol/L (ref 0–100)
TCO2: 24 mmol/L (ref 0–100)
TCO2: 23 mmol/L (ref 0–100)

## 2016-10-03 LAB — POCT I-STAT 3, ART BLOOD GAS (G3+)
ACID-BASE DEFICIT: 4 mmol/L — AB (ref 0.0–2.0)
ACID-BASE EXCESS: 1 mmol/L (ref 0.0–2.0)
Acid-base deficit: 2 mmol/L (ref 0.0–2.0)
Acid-base deficit: 4 mmol/L — ABNORMAL HIGH (ref 0.0–2.0)
Acid-base deficit: 3 mmol/L — ABNORMAL HIGH (ref 0.0–2.0)
BICARBONATE: 24.4 mmol/L (ref 20.0–28.0)
BICARBONATE: 21.6 mmol/L (ref 20.0–28.0)
BICARBONATE: 21.6 mmol/L (ref 20.0–28.0)
Bicarbonate: 25.6 mmol/L (ref 20.0–28.0)
Bicarbonate: 21.7 mmol/L (ref 20.0–28.0)
O2 SAT: 99 %
O2 SAT: 99 %
O2 Saturation: 100 %
O2 Saturation: 97 %
O2 Saturation: 98 %
PCO2 ART: 43.1 mmHg (ref 32.0–48.0)
PCO2 ART: 36 mmHg (ref 32.0–48.0)
PH ART: 7.398 (ref 7.350–7.450)
PH ART: 7.327 — AB (ref 7.350–7.450)
PH ART: 7.308 — AB (ref 7.350–7.450)
PO2 ART: 100 mmHg (ref 83.0–108.0)
PO2 ART: 106 mmHg (ref 83.0–108.0)
PO2 ART: 123 mmHg — AB (ref 83.0–108.0)
Patient temperature: 36.2
Patient temperature: 37
Patient temperature: 37.3
Patient temperature: 37.4
TCO2: 27 mmol/L (ref 0–100)
TCO2: 26 mmol/L (ref 0–100)
TCO2: 23 mmol/L (ref 0–100)
TCO2: 23 mmol/L (ref 0–100)
TCO2: 23 mmol/L (ref 0–100)
pCO2 arterial: 41.5 mmHg (ref 32.0–48.0)
pCO2 arterial: 46.2 mmHg (ref 32.0–48.0)
pCO2 arterial: 39.3 mmHg (ref 32.0–48.0)
pH, Arterial: 7.349 — ABNORMAL LOW (ref 7.350–7.450)
pH, Arterial: 7.39 (ref 7.350–7.450)
pO2, Arterial: 307 mmHg — ABNORMAL HIGH (ref 83.0–108.0)
pO2, Arterial: 122 mmHg — ABNORMAL HIGH (ref 83.0–108.0)

## 2016-10-03 LAB — PROTIME-INR
INR: 1.33
Prothrombin Time: 16.6 seconds — ABNORMAL HIGH (ref 11.4–15.2)

## 2016-10-03 LAB — HEMOGLOBIN A1C
HEMOGLOBIN A1C: 5.6 % (ref 4.8–5.6)
Mean Plasma Glucose: 114 mg/dL

## 2016-10-03 LAB — CBC
HCT: 31.1 % — ABNORMAL LOW (ref 39.0–52.0)
HEMATOCRIT: 30.7 % — AB (ref 39.0–52.0)
HEMOGLOBIN: 10.1 g/dL — AB (ref 13.0–17.0)
HEMOGLOBIN: 10.4 g/dL — AB (ref 13.0–17.0)
MCH: 29.4 pg (ref 26.0–34.0)
MCH: 29.8 pg (ref 26.0–34.0)
MCHC: 32.9 g/dL (ref 30.0–36.0)
MCHC: 33.4 g/dL (ref 30.0–36.0)
MCV: 89.2 fL (ref 78.0–100.0)
MCV: 89.1 fL (ref 78.0–100.0)
Platelets: 148 10*3/uL — ABNORMAL LOW (ref 150–400)
Platelets: 185 10*3/uL (ref 150–400)
RBC: 3.44 MIL/uL — AB (ref 4.22–5.81)
RBC: 3.49 MIL/uL — AB (ref 4.22–5.81)
RDW: 13.5 % (ref 11.5–15.5)
RDW: 13.6 % (ref 11.5–15.5)
WBC: 12.3 10*3/uL — AB (ref 4.0–10.5)
WBC: 13.4 10*3/uL — ABNORMAL HIGH (ref 4.0–10.5)

## 2016-10-03 LAB — POCT I-STAT 4, (NA,K, GLUC, HGB,HCT)
Glucose, Bld: 112 mg/dL — ABNORMAL HIGH (ref 65–99)
HCT: 26 % — ABNORMAL LOW (ref 39.0–52.0)
HEMOGLOBIN: 8.8 g/dL — AB (ref 13.0–17.0)
Potassium: 3.6 mmol/L (ref 3.5–5.1)
SODIUM: 141 mmol/L (ref 135–145)

## 2016-10-03 LAB — CREATININE, SERUM
Creatinine, Ser: 0.61 mg/dL (ref 0.61–1.24)
GFR calc Af Amer: >60 mL/min (ref >60)
GFR calc non Af Amer: >60 mL/min (ref >60)

## 2016-10-03 LAB — MAGNESIUM: MAGNESIUM: 2.9 mg/dL — AB (ref 1.7–2.4)

## 2016-10-03 LAB — APTT: APTT: 41 s — AB (ref 24–36)

## 2016-10-03 LAB — PLATELET COUNT: Platelets: 185 10*3/uL (ref 150–400)

## 2016-10-03 LAB — HEMOGLOBIN AND HEMATOCRIT, BLOOD
HCT: 29.3 % — ABNORMAL LOW (ref 39.0–52.0)
Hemoglobin: 9.9 g/dL — ABNORMAL LOW (ref 13.0–17.0)

## 2016-10-03 SURGERY — CORONARY ARTERY BYPASS GRAFTING (CABG)
Anesthesia: General | Site: Chest

## 2016-10-03 MED ORDER — LACTATED RINGERS IV SOLN: 500.0000 mL | Freq: Once | INTRAVENOUS | Status: DC | PRN

## 2016-10-03 MED ORDER — ALBUMIN HUMAN 5 % IV SOLN
250.0000 mL | INTRAVENOUS | Status: AC | PRN
  Administered 2016-10-03 (×2): 250 mL via INTRAVENOUS
  Filled 2016-10-03: qty 250

## 2016-10-03 MED ORDER — OXYCODONE HCL 5 MG PO TABS
5.0000 mg | ORAL_TABLET | ORAL | Status: DC | PRN
  Administered 2016-10-04 – 2016-10-07 (×7): 10 mg via ORAL
  Administered 2016-10-07: 5 mg via ORAL
  Filled 2016-10-03: qty 1
  Filled 2016-10-03 (×8): qty 2

## 2016-10-03 MED ORDER — INSULIN REGULAR BOLUS VIA INFUSION
0.0000 [IU] | Freq: Three times a day (TID) | INTRAVENOUS | Status: DC
  Filled 2016-10-03: qty 10

## 2016-10-03 MED ORDER — PHENYLEPHRINE HCL 10 MG/ML IJ SOLN
0.0000 ug/min | INTRAVENOUS | Status: DC
  Administered 2016-10-03: 35 ug/min via INTRAVENOUS
  Filled 2016-10-03 (×2): qty 2

## 2016-10-03 MED ORDER — MAGNESIUM SULFATE 4 GM/100ML IV SOLN
4.0000 g | Freq: Once | INTRAVENOUS | Status: AC
  Administered 2016-10-03: 4 g via INTRAVENOUS
  Filled 2016-10-03: qty 100

## 2016-10-03 MED ORDER — METOPROLOL TARTRATE 12.5 MG HALF TABLET
12.5000 mg | ORAL_TABLET | Freq: Two times a day (BID) | ORAL | Status: DC
  Administered 2016-10-04 – 2016-10-07 (×7): 12.5 mg via ORAL
  Filled 2016-10-03 (×7): qty 1

## 2016-10-03 MED ORDER — SODIUM CHLORIDE 0.9 % IJ SOLN
INTRAMUSCULAR | Status: AC
  Filled 2016-10-03: qty 10

## 2016-10-03 MED ORDER — MIDAZOLAM HCL 10 MG/2ML IJ SOLN
INTRAMUSCULAR | Status: AC
Start: 2016-10-03 — End: 2016-10-03
  Filled 2016-10-03: qty 2

## 2016-10-03 MED ORDER — METOPROLOL TARTRATE 25 MG/10 ML ORAL SUSPENSION: 12.5000 mg | Freq: Two times a day (BID) | ORAL | Status: DC

## 2016-10-03 MED ORDER — HEPARIN SODIUM (PORCINE) 1000 UNIT/ML IJ SOLN
INTRAMUSCULAR | Status: AC
  Filled 2016-10-03: qty 1

## 2016-10-03 MED ORDER — SODIUM CHLORIDE 0.9 % IV SOLN
30.0000 meq | Freq: Once | INTRAVENOUS | Status: AC
  Administered 2016-10-03: 30 meq via INTRAVENOUS
  Filled 2016-10-03: qty 15

## 2016-10-03 MED ORDER — VECURONIUM BROMIDE 10 MG IV SOLR
INTRAVENOUS | Status: DC | PRN
  Administered 2016-10-03: 4 mg via INTRAVENOUS
  Administered 2016-10-03 (×2): 3 mg via INTRAVENOUS

## 2016-10-03 MED ORDER — THROMBIN 20000 UNITS EX SOLR
CUTANEOUS | Status: DC | PRN
  Administered 2016-10-03: 20000 [IU] via TOPICAL

## 2016-10-03 MED ORDER — LACTATED RINGERS IV SOLN
INTRAVENOUS | Status: DC
  Administered 2016-10-03: 13:00:00 via INTRAVENOUS

## 2016-10-03 MED ORDER — SODIUM CHLORIDE 0.9% FLUSH: 3.0000 mL | INTRAVENOUS | Status: DC | PRN

## 2016-10-03 MED ORDER — CHLORHEXIDINE GLUCONATE 0.12 % MT SOLN
15.0000 mL | OROMUCOSAL | Status: AC
  Administered 2016-10-03: 15 mL via OROMUCOSAL

## 2016-10-03 MED ORDER — NITROGLYCERIN IN D5W 200-5 MCG/ML-% IV SOLN: 0.0000 ug/min | INTRAVENOUS | Status: DC

## 2016-10-03 MED ORDER — SODIUM CHLORIDE 0.9 % IV SOLN
INTRAVENOUS | Status: DC
  Administered 2016-10-03: 13:00:00 via INTRAVENOUS

## 2016-10-03 MED ORDER — DEXTROSE 5 % IV SOLN
INTRAVENOUS | Status: DC | PRN
  Administered 2016-10-03: 40 ug/min via INTRAVENOUS

## 2016-10-03 MED ORDER — LACTATED RINGERS IV SOLN
INTRAVENOUS | Status: DC | PRN
  Administered 2016-10-03 (×2): via INTRAVENOUS

## 2016-10-03 MED ORDER — VECURONIUM BROMIDE 10 MG IV SOLR: INTRAVENOUS | Status: DC | PRN

## 2016-10-03 MED ORDER — ROCURONIUM BROMIDE 100 MG/10ML IV SOLN
INTRAVENOUS | Status: DC | PRN
  Administered 2016-10-03: 80 mg via INTRAVENOUS
  Administered 2016-10-03: 20 mg via INTRAVENOUS

## 2016-10-03 MED ORDER — PANTOPRAZOLE SODIUM 40 MG PO TBEC
40.0000 mg | DELAYED_RELEASE_TABLET | Freq: Every day | ORAL | Status: DC
  Administered 2016-10-05 – 2016-10-08 (×4): 40 mg via ORAL
  Filled 2016-10-03 (×4): qty 1

## 2016-10-03 MED ORDER — PROPOFOL 10 MG/ML IV BOLUS
INTRAVENOUS | Status: AC
  Filled 2016-10-03: qty 20

## 2016-10-03 MED ORDER — THROMBIN 20000 UNITS EX SOLR
CUTANEOUS | Status: AC
  Filled 2016-10-03: qty 20000

## 2016-10-03 MED ORDER — BISACODYL 5 MG PO TBEC
10.0000 mg | DELAYED_RELEASE_TABLET | Freq: Every day | ORAL | Status: DC
  Administered 2016-10-04 – 2016-10-08 (×3): 10 mg via ORAL
  Filled 2016-10-03 (×5): qty 2

## 2016-10-03 MED ORDER — DEXTROSE 5 % IV SOLN
1.5000 g | Freq: Two times a day (BID) | INTRAVENOUS | Status: AC
  Administered 2016-10-03 – 2016-10-05 (×4): 1.5 g via INTRAVENOUS
  Filled 2016-10-03 (×4): qty 1.5

## 2016-10-03 MED ORDER — LIDOCAINE 2% (20 MG/ML) 5 ML SYRINGE
INTRAMUSCULAR | Status: AC
  Filled 2016-10-03: qty 5

## 2016-10-03 MED ORDER — ACETAMINOPHEN 160 MG/5ML PO SOLN: 650.0000 mg | Freq: Once | ORAL | Status: AC

## 2016-10-03 MED ORDER — ONDANSETRON HCL 4 MG/2ML IJ SOLN: 4.0000 mg | Freq: Four times a day (QID) | INTRAMUSCULAR | Status: DC | PRN

## 2016-10-03 MED ORDER — VECURONIUM BROMIDE 10 MG IV SOLR
INTRAVENOUS | Status: AC
  Filled 2016-10-03: qty 10

## 2016-10-03 MED ORDER — ROCURONIUM BROMIDE 50 MG/5ML IV SOSY
PREFILLED_SYRINGE | INTRAVENOUS | Status: AC
  Filled 2016-10-03: qty 5

## 2016-10-03 MED ORDER — SODIUM CHLORIDE 0.9 % IV SOLN: 250.0000 mL | INTRAVENOUS | Status: DC

## 2016-10-03 MED ORDER — MORPHINE SULFATE (PF) 2 MG/ML IV SOLN
1.0000 mg | INTRAVENOUS | Status: AC | PRN
  Administered 2016-10-03: 2 mg via INTRAVENOUS
  Administered 2016-10-03: 1 mg via INTRAVENOUS
  Administered 2016-10-03: 2 mg via INTRAVENOUS
  Administered 2016-10-03: 1 mg via INTRAVENOUS
  Administered 2016-10-03: 2 mg via INTRAVENOUS
  Administered 2016-10-03: 1 mg via INTRAVENOUS
  Filled 2016-10-03: qty 1
  Filled 2016-10-03: qty 2
  Filled 2016-10-03 (×2): qty 1

## 2016-10-03 MED ORDER — ALBUMIN HUMAN 5 % IV SOLN
INTRAVENOUS | Status: DC | PRN
  Administered 2016-10-03 (×2): via INTRAVENOUS

## 2016-10-03 MED ORDER — PROTAMINE SULFATE 10 MG/ML IV SOLN
INTRAVENOUS | Status: AC
  Filled 2016-10-03: qty 25

## 2016-10-03 MED ORDER — ACETAMINOPHEN 500 MG PO TABS
1000.0000 mg | ORAL_TABLET | Freq: Four times a day (QID) | ORAL | Status: DC
  Administered 2016-10-04 – 2016-10-08 (×16): 1000 mg via ORAL
  Filled 2016-10-03 (×17): qty 2

## 2016-10-03 MED ORDER — THROMBIN 20000 UNITS EX SOLR
OROMUCOSAL | Status: DC | PRN
  Administered 2016-10-03 (×3): 4 mL via TOPICAL

## 2016-10-03 MED ORDER — ARTIFICIAL TEARS OP OINT
TOPICAL_OINTMENT | OPHTHALMIC | Status: AC
  Filled 2016-10-03: qty 3.5

## 2016-10-03 MED ORDER — LACTATED RINGERS IV SOLN
INTRAVENOUS | Status: DC | PRN
  Administered 2016-10-03: 07:00:00 via INTRAVENOUS

## 2016-10-03 MED ORDER — DOCUSATE SODIUM 100 MG PO CAPS
200.0000 mg | ORAL_CAPSULE | Freq: Every day | ORAL | Status: DC
  Administered 2016-10-04 – 2016-10-08 (×4): 200 mg via ORAL
  Filled 2016-10-03 (×5): qty 2

## 2016-10-03 MED ORDER — MIDAZOLAM HCL 2 MG/2ML IJ SOLN: 2.0000 mg | INTRAMUSCULAR | Status: DC | PRN

## 2016-10-03 MED ORDER — MIDAZOLAM HCL 5 MG/5ML IJ SOLN
INTRAMUSCULAR | Status: DC | PRN
  Administered 2016-10-03: 2 mg via INTRAVENOUS
  Administered 2016-10-03: 1 mg via INTRAVENOUS
  Administered 2016-10-03 (×2): 3 mg via INTRAVENOUS
  Administered 2016-10-03: 1 mg via INTRAVENOUS

## 2016-10-03 MED ORDER — PHENYLEPHRINE HCL 10 MG/ML IJ SOLN
INTRAMUSCULAR | Status: DC | PRN
  Administered 2016-10-03 (×2): 80 ug via INTRAVENOUS
  Administered 2016-10-03: 40 ug via INTRAVENOUS

## 2016-10-03 MED ORDER — PROPOFOL 10 MG/ML IV BOLUS
INTRAVENOUS | Status: DC | PRN
  Administered 2016-10-03: 70 mg via INTRAVENOUS
  Administered 2016-10-03: 50 mg via INTRAVENOUS
  Administered 2016-10-03: 40 mg via INTRAVENOUS

## 2016-10-03 MED ORDER — FENTANYL CITRATE (PF) 250 MCG/5ML IJ SOLN
INTRAMUSCULAR | Status: DC | PRN
  Administered 2016-10-03: 50 ug via INTRAVENOUS
  Administered 2016-10-03: 250 ug via INTRAVENOUS
  Administered 2016-10-03: 200 ug via INTRAVENOUS
  Administered 2016-10-03 (×2): 250 ug via INTRAVENOUS
  Administered 2016-10-03: 25 ug via INTRAVENOUS
  Administered 2016-10-03: 175 ug via INTRAVENOUS
  Administered 2016-10-03: 300 ug via INTRAVENOUS

## 2016-10-03 MED ORDER — FENTANYL CITRATE (PF) 250 MCG/5ML IJ SOLN
INTRAMUSCULAR | Status: AC
  Filled 2016-10-03: qty 30

## 2016-10-03 MED ORDER — SODIUM CHLORIDE 0.9% FLUSH
3.0000 mL | Freq: Two times a day (BID) | INTRAVENOUS | Status: DC
  Administered 2016-10-04 – 2016-10-05 (×2): 3 mL via INTRAVENOUS

## 2016-10-03 MED ORDER — TRAMADOL HCL 50 MG PO TABS
50.0000 mg | ORAL_TABLET | ORAL | Status: DC | PRN
  Administered 2016-10-04 (×2): 100 mg via ORAL
  Filled 2016-10-03 (×2): qty 2

## 2016-10-03 MED ORDER — SODIUM CHLORIDE 0.45 % IV SOLN: INTRAVENOUS | Status: DC | PRN

## 2016-10-03 MED ORDER — ACETAMINOPHEN 650 MG RE SUPP
650.0000 mg | Freq: Once | RECTAL | Status: AC
  Administered 2016-10-03: 650 mg via RECTAL

## 2016-10-03 MED ORDER — FENTANYL CITRATE (PF) 250 MCG/5ML IJ SOLN
INTRAMUSCULAR | Status: AC
  Filled 2016-10-03: qty 5

## 2016-10-03 MED ORDER — ASPIRIN EC 325 MG PO TBEC
325.0000 mg | DELAYED_RELEASE_TABLET | Freq: Every day | ORAL | Status: DC
  Administered 2016-10-04 – 2016-10-08 (×5): 325 mg via ORAL
  Filled 2016-10-03 (×5): qty 1

## 2016-10-03 MED ORDER — ACETAMINOPHEN 160 MG/5ML PO SOLN: 1000.0000 mg | Freq: Four times a day (QID) | ORAL | Status: DC

## 2016-10-03 MED ORDER — HEMOSTATIC AGENTS (NO CHARGE) OPTIME
TOPICAL | Status: DC | PRN
  Administered 2016-10-03: 1 via TOPICAL

## 2016-10-03 MED ORDER — BISACODYL 10 MG RE SUPP
10.0000 mg | Freq: Every day | RECTAL | Status: DC
  Filled 2016-10-03 (×2): qty 1

## 2016-10-03 MED ORDER — FAMOTIDINE IN NACL 20-0.9 MG/50ML-% IV SOLN
20.0000 mg | Freq: Two times a day (BID) | INTRAVENOUS | Status: AC
  Administered 2016-10-03 (×2): 20 mg via INTRAVENOUS
  Filled 2016-10-03: qty 50

## 2016-10-03 MED ORDER — SODIUM CHLORIDE 0.9 % IV SOLN
INTRAVENOUS | Status: DC
  Filled 2016-10-03 (×2): qty 2.5

## 2016-10-03 MED ORDER — PROTAMINE SULFATE 10 MG/ML IV SOLN
INTRAVENOUS | Status: DC | PRN
  Administered 2016-10-03: 200 mg via INTRAVENOUS

## 2016-10-03 MED ORDER — HEPARIN SODIUM (PORCINE) 1000 UNIT/ML IJ SOLN
INTRAMUSCULAR | Status: DC | PRN
  Administered 2016-10-03: 22000 [IU] via INTRAVENOUS

## 2016-10-03 MED ORDER — ASPIRIN 81 MG PO CHEW
324.0000 mg | CHEWABLE_TABLET | Freq: Every day | ORAL | Status: DC
  Filled 2016-10-03 (×2): qty 4

## 2016-10-03 MED ORDER — MORPHINE SULFATE (PF) 2 MG/ML IV SOLN
2.0000 mg | INTRAVENOUS | Status: DC | PRN
  Administered 2016-10-04: 4 mg via INTRAVENOUS
  Administered 2016-10-04: 2 mg via INTRAVENOUS
  Administered 2016-10-04: 4 mg via INTRAVENOUS
  Administered 2016-10-04: 2 mg via INTRAVENOUS
  Administered 2016-10-05: 4 mg via INTRAVENOUS
  Filled 2016-10-03 (×3): qty 1
  Filled 2016-10-03 (×3): qty 2

## 2016-10-03 MED ORDER — EPHEDRINE SULFATE 50 MG/ML IJ SOLN
INTRAMUSCULAR | Status: DC | PRN
  Administered 2016-10-03 (×2): 5 mg via INTRAVENOUS

## 2016-10-03 MED ORDER — VANCOMYCIN HCL IN DEXTROSE 1-5 GM/200ML-% IV SOLN
1000.0000 mg | Freq: Once | INTRAVENOUS | Status: AC
  Administered 2016-10-03: 1000 mg via INTRAVENOUS
  Filled 2016-10-03: qty 200

## 2016-10-03 MED ORDER — METOPROLOL TARTRATE 5 MG/5ML IV SOLN: 2.5000 mg | INTRAVENOUS | Status: DC | PRN

## 2016-10-03 MED ORDER — DEXMEDETOMIDINE HCL IN NACL 200 MCG/50ML IV SOLN
0.0000 ug/kg/h | INTRAVENOUS | Status: DC
  Filled 2016-10-03: qty 50

## 2016-10-03 MED ORDER — 0.9 % SODIUM CHLORIDE (POUR BTL) OPTIME
TOPICAL | Status: DC | PRN
  Administered 2016-10-03: 6000 mL

## 2016-10-03 MED ORDER — CHLORHEXIDINE GLUCONATE 4 % EX LIQD: 30.0000 mL | CUTANEOUS | Status: DC

## 2016-10-03 MED FILL — Electrolyte-R (PH 7.4) Solution: INTRAVENOUS | Qty: 4000 | Status: AC

## 2016-10-03 MED FILL — Mannitol IV Soln 20%: INTRAVENOUS | Qty: 500 | Status: AC

## 2016-10-03 MED FILL — Sodium Bicarbonate IV Soln 8.4%: INTRAVENOUS | Qty: 50 | Status: AC

## 2016-10-03 MED FILL — Sodium Chloride IV Soln 0.9%: INTRAVENOUS | Qty: 2000 | Status: AC

## 2016-10-03 MED FILL — Heparin Sodium (Porcine) Inj 1000 Unit/ML: INTRAMUSCULAR | Qty: 10 | Status: AC

## 2016-10-03 MED FILL — Lidocaine HCl IV Inj 20 MG/ML: INTRAVENOUS | Qty: 5 | Status: AC

## 2016-10-03 SURGICAL SUPPLY — 104 items
BAG DECANTER FOR FLEXI CONT (MISCELLANEOUS) ×4 IMPLANT
BANDAGE ACE 4X5 VEL STRL LF (GAUZE/BANDAGES/DRESSINGS) IMPLANT
BANDAGE ACE 6X5 VEL STRL LF (GAUZE/BANDAGES/DRESSINGS) IMPLANT
BANDAGE ELASTIC 4 VELCRO ST LF (GAUZE/BANDAGES/DRESSINGS) ×4 IMPLANT
BANDAGE ELASTIC 6 VELCRO ST LF (GAUZE/BANDAGES/DRESSINGS) ×4 IMPLANT
BASKET HEART  (ORDER IN 25'S) (MISCELLANEOUS) ×1
BASKET HEART (ORDER IN 25'S) (MISCELLANEOUS) ×1
BASKET HEART (ORDER IN 25S) (MISCELLANEOUS) ×2 IMPLANT
BENZOIN TINCTURE PRP APPL 2/3 (GAUZE/BANDAGES/DRESSINGS) ×4 IMPLANT
BLADE STERNUM SYSTEM 6 (BLADE) ×4 IMPLANT
BNDG GAUZE ELAST 4 BULKY (GAUZE/BANDAGES/DRESSINGS) ×4 IMPLANT
CANISTER SUCTION 2500CC (MISCELLANEOUS) ×4 IMPLANT
CATH ROBINSON RED A/P 18FR (CATHETERS) ×8 IMPLANT
CATH THORACIC 28FR (CATHETERS) ×4 IMPLANT
CATH THORACIC 36FR (CATHETERS) ×4 IMPLANT
CATH THORACIC 36FR RT ANG (CATHETERS) ×4 IMPLANT
CHLORAPREP W/TINT 10.5 ML (MISCELLANEOUS) ×4 IMPLANT
CLIP TI MEDIUM 24 (CLIP) IMPLANT
CLIP TI WIDE RED SMALL 24 (CLIP) ×4 IMPLANT
COVER SURGICAL LIGHT HANDLE (MISCELLANEOUS) ×4 IMPLANT
CRADLE DONUT ADULT HEAD (MISCELLANEOUS) ×4 IMPLANT
DRAPE CARDIOVASCULAR INCISE (DRAPES) ×2
DRAPE SLUSH/WARMER DISC (DRAPES) ×4 IMPLANT
DRAPE SRG 135X102X78XABS (DRAPES) ×2 IMPLANT
DRSG COVADERM 4X14 (GAUZE/BANDAGES/DRESSINGS) ×4 IMPLANT
ELECT CAUTERY BLADE 6.4 (BLADE) ×4 IMPLANT
ELECT REM PT RETURN 9FT ADLT (ELECTROSURGICAL) ×8
ELECTRODE REM PT RTRN 9FT ADLT (ELECTROSURGICAL) ×4 IMPLANT
FELT TEFLON 1X6 (MISCELLANEOUS) IMPLANT
GAUZE SPONGE 4X4 12PLY STRL (GAUZE/BANDAGES/DRESSINGS) IMPLANT
GLOVE BIO SURGEON STRL SZ 6 (GLOVE) IMPLANT
GLOVE BIO SURGEON STRL SZ 6.5 (GLOVE) ×6 IMPLANT
GLOVE BIO SURGEON STRL SZ7 (GLOVE) ×24 IMPLANT
GLOVE BIO SURGEON STRL SZ7.5 (GLOVE) ×8 IMPLANT
GLOVE BIO SURGEONS STRL SZ 6.5 (GLOVE) ×2
GLOVE BIOGEL PI IND STRL 6 (GLOVE) IMPLANT
GLOVE BIOGEL PI IND STRL 6.5 (GLOVE) ×6 IMPLANT
GLOVE BIOGEL PI IND STRL 7.0 (GLOVE) IMPLANT
GLOVE BIOGEL PI INDICATOR 6 (GLOVE)
GLOVE BIOGEL PI INDICATOR 6.5 (GLOVE) ×6
GLOVE BIOGEL PI INDICATOR 7.0 (GLOVE)
GLOVE EUDERMIC 7 POWDERFREE (GLOVE) ×8 IMPLANT
GLOVE ORTHO TXT STRL SZ7.5 (GLOVE) IMPLANT
GOWN STRL REUS W/ TWL LRG LVL3 (GOWN DISPOSABLE) ×16 IMPLANT
GOWN STRL REUS W/ TWL XL LVL3 (GOWN DISPOSABLE) ×2 IMPLANT
GOWN STRL REUS W/TWL LRG LVL3 (GOWN DISPOSABLE) ×16
GOWN STRL REUS W/TWL XL LVL3 (GOWN DISPOSABLE) ×2
HEMOSTAT POWDER SURGIFOAM 1G (HEMOSTASIS) ×12 IMPLANT
HEMOSTAT SURGICEL 2X14 (HEMOSTASIS) ×4 IMPLANT
INSERT FOGARTY 61MM (MISCELLANEOUS) IMPLANT
INSERT FOGARTY XLG (MISCELLANEOUS) IMPLANT
KIT BASIN OR (CUSTOM PROCEDURE TRAY) ×4 IMPLANT
KIT CATH CPB BARTLE (MISCELLANEOUS) ×4 IMPLANT
KIT ROOM TURNOVER OR (KITS) ×4 IMPLANT
KIT SUCTION CATH 14FR (SUCTIONS) ×4 IMPLANT
KIT VASOVIEW HEMOPRO VH 3000 (KITS) ×4 IMPLANT
NS IRRIG 1000ML POUR BTL (IV SOLUTION) ×24 IMPLANT
PACK OPEN HEART (CUSTOM PROCEDURE TRAY) ×4 IMPLANT
PAD ARMBOARD 7.5X6 YLW CONV (MISCELLANEOUS) ×8 IMPLANT
PAD ELECT DEFIB RADIOL ZOLL (MISCELLANEOUS) ×4 IMPLANT
PENCIL BUTTON HOLSTER BLD 10FT (ELECTRODE) ×4 IMPLANT
PUNCH AORTIC ROTATE 4.0MM (MISCELLANEOUS) IMPLANT
PUNCH AORTIC ROTATE 4.5MM 8IN (MISCELLANEOUS) ×4 IMPLANT
PUNCH AORTIC ROTATE 5MM 8IN (MISCELLANEOUS) IMPLANT
SET CARDIOPLEGIA MPS 5001102 (MISCELLANEOUS) ×4 IMPLANT
SOLUTION ANTI FOG 6CC (MISCELLANEOUS) ×4 IMPLANT
SPONGE GAUZE 4X4 12PLY STER LF (GAUZE/BANDAGES/DRESSINGS) ×8 IMPLANT
SPONGE INTESTINAL PEANUT (DISPOSABLE) IMPLANT
SPONGE LAP 18X18 X RAY DECT (DISPOSABLE) ×4 IMPLANT
SPONGE LAP 4X18 X RAY DECT (DISPOSABLE) ×4 IMPLANT
SUT BONE WAX W31G (SUTURE) ×4 IMPLANT
SUT MNCRL AB 4-0 PS2 18 (SUTURE) IMPLANT
SUT PROLENE 3 0 SH DA (SUTURE) IMPLANT
SUT PROLENE 3 0 SH1 36 (SUTURE) ×4 IMPLANT
SUT PROLENE 4 0 RB 1 (SUTURE)
SUT PROLENE 4 0 SH DA (SUTURE) IMPLANT
SUT PROLENE 4-0 RB1 .5 CRCL 36 (SUTURE) IMPLANT
SUT PROLENE 5 0 C 1 36 (SUTURE) IMPLANT
SUT PROLENE 6 0 C 1 30 (SUTURE) IMPLANT
SUT PROLENE 7 0 BV 1 (SUTURE) IMPLANT
SUT PROLENE 7 0 BV1 MDA (SUTURE) ×8 IMPLANT
SUT PROLENE 8 0 BV175 6 (SUTURE) IMPLANT
SUT SILK  1 MH (SUTURE)
SUT SILK 1 MH (SUTURE) IMPLANT
SUT STEEL STERNAL CCS#1 18IN (SUTURE) IMPLANT
SUT STEEL SZ 6 DBL 3X14 BALL (SUTURE) IMPLANT
SUT VIC AB 1 CTX 36 (SUTURE) ×4
SUT VIC AB 1 CTX36XBRD ANBCTR (SUTURE) ×4 IMPLANT
SUT VIC AB 2-0 CT1 27 (SUTURE) ×2
SUT VIC AB 2-0 CT1 TAPERPNT 27 (SUTURE) ×2 IMPLANT
SUT VIC AB 2-0 CTX 27 (SUTURE) IMPLANT
SUT VIC AB 3-0 SH 27 (SUTURE)
SUT VIC AB 3-0 SH 27X BRD (SUTURE) IMPLANT
SUT VIC AB 3-0 X1 27 (SUTURE) ×4 IMPLANT
SUT VICRYL 4-0 PS2 18IN ABS (SUTURE) IMPLANT
SUTURE E-PAK OPEN HEART (SUTURE) ×4 IMPLANT
SYSTEM SAHARA CHEST DRAIN ATS (WOUND CARE) ×4 IMPLANT
TAPE CLOTH SURG 4X10 WHT LF (GAUZE/BANDAGES/DRESSINGS) ×4 IMPLANT
TOWEL OR 17X24 6PK STRL BLUE (TOWEL DISPOSABLE) ×4 IMPLANT
TOWEL OR 17X26 10 PK STRL BLUE (TOWEL DISPOSABLE) ×4 IMPLANT
TRAY FOLEY IC TEMP SENS 16FR (CATHETERS) ×4 IMPLANT
TUBING INSUFFLATION (TUBING) ×4 IMPLANT
UNDERPAD 30X30 (UNDERPADS AND DIAPERS) ×4 IMPLANT
WATER STERILE IRR 1000ML POUR (IV SOLUTION) ×8 IMPLANT

## 2016-10-03 NOTE — Anesthesia Procedure Notes (Addendum)
Central Venous Catheter Insertion Performed by: Rica Koyanagi, anesthesiologist Start/End12/29/2017 6:50 AM, 10/03/2016 7:07 AM Patient location: Pre-op. Preanesthetic checklist: patient identified, IV checked, site marked, risks and benefits discussed, surgical consent, monitors and equipment checked, pre-op evaluation, timeout performed and anesthesia consent Lidocaine 1% used for infiltration and patient sedated Hand hygiene performed  and maximum sterile barriers used  Catheter size: 8.5 Fr PA cath was placed.Sheath introducer Swan type:thermodilution Procedure performed using ultrasound guided technique. Ultrasound Notes:anatomy identified, needle tip was noted to be adjacent to the nerve/plexus identified, no ultrasound evidence of intravascular and/or intraneural injection and image(s) printed for medical record Attempts: 1 Following insertion, line sutured and dressing applied. Post procedure assessment: blood return through all ports, free fluid flow and no air  Patient tolerated the procedure well with no immediate complications.

## 2016-10-03 NOTE — Plan of Care (Signed)
Problem: Activity: Goal: Risk for activity intolerance will decrease Outcome: Progressing Pt to be OOB in am upon swan removal.  Problem: Bowel/Gastric: Goal: Gastrointestinal status for postoperative course will improve Outcome: Progressing Hypoactive BS returning.  Problem: Cardiac: Goal: Hemodynamic stability will improve Outcome: Progressing VVS on minimal drips Goal: Ability to maintain an adequate cardiac output will improve Outcome: Progressing CI greater than 2 Goal: Will show no signs and symptoms of excessive bleeding Outcome: Progressing Chest tube output WNL  Problem: Respiratory: Goal: Ability to tolerate decreased levels of ventilator support will improve Outcome: Completed/Met Date Met: 10/03/16 Extubated 10/03/2016  Problem: Pain Management: Goal: Pain level will decrease Outcome: Progressing Minimal c/o pain

## 2016-10-03 NOTE — Interval H&P Note (Signed)
History and Physical Interval Note:  10/03/2016 6:02 AM  Scott Jacobson  has presented today for surgery, with the diagnosis of CAD  The various methods of treatment have been discussed with the patient and family. After consideration of risks, benefits and other options for treatment, the patient has consented to  Procedure(s): CORONARY ARTERY BYPASS GRAFTING (CABG) (N/A) TRANSESOPHAGEAL ECHOCARDIOGRAM (TEE) (N/A) as a surgical intervention .  The patient's history has been reviewed, patient examined, no change in status, stable for surgery.  I have reviewed the patient's chart and labs.  Questions were answered to the patient's satisfaction.     Gaye Pollack

## 2016-10-03 NOTE — Progress Notes (Signed)
Patient is still drowsy and not quite awake enough to proceed with rapid wean

## 2016-10-03 NOTE — Progress Notes (Signed)
Patient failed mechanics, ABG ph 7.308, paCO2 43.1, paO2 106, HCO3 21.6, O2 Sat 98.  RT to place patient back on vent 40/5/12 TV 560.    On call MD aware

## 2016-10-03 NOTE — Transfer of Care (Signed)
Immediate Anesthesia Transfer of Care Note  Patient: Scott Jacobson  Procedure(s) Performed: Procedure(s): CORONARY ARTERY BYPASS GRAFTING (CABG)x 4 with endoscopic harvesting of right saphenous vein ,LIMA-LAD SVG-DIAG SVG-OM SVG-RCA (N/A) TRANSESOPHAGEAL ECHOCARDIOGRAM (TEE) (N/A)  Patient Location: SICU  Anesthesia Type:General  Level of Consciousness: Patient remains intubated per anesthesia plan  Airway & Oxygen Therapy: Patient remains intubated per anesthesia plan and Patient placed on Ventilator (see vital sign flow sheet for setting)  Post-op Assessment: Report given to RN and Post -op Vital signs reviewed and stable  Post vital signs: Reviewed and stable  Last Vitals:  Vitals:   10/03/16 0550  BP: 121/63  Pulse: (!) 56  Resp: 20  Temp: 36.6 C    Last Pain:  Vitals:   10/03/16 0550  TempSrc: Oral      Patients Stated Pain Goal: 3 ([id number])  Complications: No apparent anesthesia complications

## 2016-10-03 NOTE — Progress Notes (Signed)
Rapid wean started at 1612, patient was uncomfortable, RR in the 40s, did not tolerate, RT placed patient back on full vent settings at 1615, will administer pain meds

## 2016-10-03 NOTE — Op Note (Signed)
CARDIOVASCULAR SURGERY OPERATIVE NOTE  10/03/2016  Surgeon:  Gaye Pollack, MD  First Assistant: Jadene Pierini,  PA-C   Preoperative Diagnosis:  Left main and severe multi-vessel coronary artery disease   Postoperative Diagnosis:  Same   Procedure:  1. Median Sternotomy 2. Extracorporeal circulation 3.   Coronary artery bypass grafting x 4   Left internal mammary graft to the LAD  SVG to diagonal  SVG to OM  SVG to PL RCA 4.   Endoscopic vein harvest from the right leg   Anesthesia:  General Endotracheal   Clinical History/Surgical Indication:  The patient is a 65 year old gentleman in fairly good health with no prior cardiac history who developed chest pain at home on 08/20/2016. He took a SL NTG that he had at home with relief. Then yesterday the pain recurred while he was 66 ft up on a ladder hanging siding. It was severe and prompted him to drive himself to [facility] ED where an ECG showed an inferior STEMI. He continued to have chest pain and was transferred to [facility] for urgent cath. His pain was almost gone down to 2/10 when he arrived. Cath showed a 99% thrombotic lesion in the mid RCA as the culprit. There was also 70% ostial and distal LM, 99% OM2 stenosis and 99% stenosis in a moderate sized diagonal. I was called to evaluate in the cath lab and after the lesion was opened with PTCA it looked much better with marked improvement in his ST elevation and pain. We planned to keep him on cangrelor overnight and do surgery today but shortly after that he developed severe chest pain and inferior ST elevation again and relook showed restenosis and it was opened and we decided to treat this with a BMS and keep on Brilinta for a month before doing CABG. Troponin went up to 50 the following morning and then decreased but his LVEF was normal on echo.  ECG shows residual of inferior STEMI. Since  the procedure he has done well.  He says that he does get some chest pressure if he is out walking in the cold weather but has none at home with normal activity.   In summary he has high grade left main and severe 3 vessel CAD s/p inferior STEMI treated with emergent PCI with a BMS to the mid RCA. He is about one month out and would like to proceed with CABG before the end of the year. I discussed the operative procedure with the patient and his wife including alternatives, benefits and risks; including but not limited to bleeding, blood transfusion, infection, stroke, myocardial infarction, graft failure, heart block requiring a permanent pacemaker, organ dysfunction, and death. Quin Hoop Mooreunderstands and agrees to proceed.   Preparation:  The patient was seen in the preoperative holding area and the correct patient, correct operation were confirmed with the patient after reviewing the medical record and catheterization. The consent was signed by me. Preoperative antibiotics were given. A pulmonary arterial line and radial arterial line were placed by the anesthesia team. The patient was taken back to the operating room and positioned supine on the operating room table. After being placed under general endotracheal anesthesia by the anesthesia team a foley catheter was placed. The neck, chest, abdomen, and both legs were prepped with betadine soap and solution and draped in the usual sterile manner. A surgical time-out was taken and the correct patient and operative procedure were confirmed with the nursing and anesthesia staff.  Cardiopulmonary Bypass:  A median sternotomy was performed. The pericardium was opened in the midline. Right ventricular function appeared normal. The ascending aorta was of normal size and had no palpable plaque. There were no contraindications to aortic cannulation or cross-clamping. The patient was fully systemically heparinized and the ACT was maintained > 400 sec. The  proximal aortic arch was cannulated with a 20 F aortic cannula for arterial inflow. Venous cannulation was performed via the right atrial appendage using a two-staged venous cannula. An antegrade cardioplegia/vent cannula was inserted into the mid-ascending aorta. Aortic occlusion was performed with a single cross-clamp. Systemic cooling to 32 degrees Centigrade and topical cooling of the heart with iced saline were used. Hyperkalemic antegrade cold blood cardioplegia was used to induce diastolic arrest and was then given at about 20 minute intervals throughout the period of arrest to maintain myocardial temperature at or below 10 degrees centigrade. A temperature probe was inserted into the interventricular septum and an insulating pad was placed in the pericardium.   Left internal mammary harvest:  The left side of the sternum was retracted using the Rultract retractor. The left internal mammary artery was harvested as a pedicle graft. All side branches were clipped. It was a medium-sized vessel of good quality with excellent blood flow. It was ligated distally and divided. It was sprayed with topical papaverine solution to prevent vasospasm.   Endoscopic vein harvest:  The right greater saphenous vein was harvested endoscopically through a 2 cm incision medial to the right knee. It was harvested from the upper thigh to below the knee. It was a medium-sized vein of good quality. The side branches were all ligated with 4-0 silk ties.    Coronary arteries:  The coronary arteries were examined.   LAD:  Small vessel with no distal disease. Diagonal was diffusely diseased throughout the proximal and mid-vessel but graftable distally.  LCX:  OM was a moderate-sized vessel with no distal disease. Distal LCX tiny.  RCA:  Large vessel with diffuse disease that extended to the origin of the PDA. The PDA was lying beneath a large PD vein. The PL was a large vessel with mild disease and the best vessel  for grafting on the right.   Grafts:  1. LIMA to the LAD: 1.6 mm. It was sewn end to side using 8-0 prolene continuous suture. 2. SVG to diagonal:  1.6 mm. It was sewn end to side using 7-0 prolene continuous suture. 3. SVG to OM:  1.75 mm. It was sewn end to side using 7-0 prolene continuous suture. 4. SVG to PL RCA:  1.75 mm. It was sewn end to side using 7-0 prolene continuous suture.  The proximal vein graft anastomoses were performed to the mid-ascending aorta using continuous 6-0 prolene suture. Graft markers were placed around the proximal anastomoses.   Completion:  The patient was rewarmed to 37 degrees Centigrade. The clamp was removed from the LIMA pedicle and there was rapid warming of the septum and return of ventricular fibrillation. The crossclamp was removed with a time of 86 minutes. There was spontaneous return of sinus rhythm. The distal and proximal anastomoses were checked for hemostasis. The position of the grafts was satisfactory. Two temporary epicardial pacing wires were placed on the right atrium and two on the right ventricle. The patient was weaned from CPB without difficulty on no inotropes. CPB time was 103 minutes. Cardiac output was 6 LPM. Heparin was fully reversed with protamine and the aortic and venous cannulas removed.  Hemostasis was achieved. Mediastinal and left pleural drainage tubes were placed. The sternum was closed with double #6 stainless steel wires. The fascia was closed with continuous # 1 vicryl suture. The subcutaneous tissue was closed with 2-0 vicryl continuous suture. The skin was closed with 3-0 vicryl subcuticular suture. All sponge, needle, and instrument counts were reported correct at the end of the case. Dry sterile dressings were placed over the incisions and around the chest tubes which were connected to pleurevac suction. The patient was then transported to the surgical intensive care unit in critical but stable condition.

## 2016-10-03 NOTE — Progress Notes (Signed)
      [address]       [place] [address]             [phone]      S/p CABG x 4  Intubated, waking up following commands  BP 95/70 (BP Location: Right Arm)   Pulse 80   Temp 98.6 F (37 C)   Resp 14   Ht 5\' 9"  (1.753 m)   Wt 185 lb (83.9 kg)   SpO2 98%   BMI 27.32 kg/m  On neo drip  Intake/Output Summary (Last 24 hours) at 10/03/16 1734 Last data filed at 10/03/16 1700  Gross per 24 hour  Intake          2426.58 ml  Output             3250 ml  Net          -823.42 ml   K= 3.6 - supplemented HCt= 31  Doing well early postop  Remo Lipps C. Roxan Hockey, MD [facility] [phone]

## 2016-10-03 NOTE — OR Nursing (Signed)
[place] call made to SICU.

## 2016-10-03 NOTE — Anesthesia Procedure Notes (Signed)
Procedure Name: Intubation Date/Time: 10/03/2016 7:52 AM Performed by: Izora Gala Pre-anesthesia Checklist: Patient identified, Emergency Drugs available, Suction available and Patient being monitored Patient Re-evaluated:Patient Re-evaluated prior to inductionOxygen Delivery Method: Circle system utilized Preoxygenation: Pre-oxygenation with 100% oxygen Intubation Type: IV induction Ventilation: Mask ventilation without difficulty and Oral airway inserted - appropriate to patient size Laryngoscope Size: Miller and 3 Grade View: Grade I Tube type: Oral Tube size: 8.0 mm Number of attempts: 1 Airway Equipment and Method: Stylet Placement Confirmation: ETT inserted through vocal cords under direct vision,  positive ETCO2 and breath sounds checked- equal and bilateral Secured at: 22 cm Tube secured with: Tape Dental Injury: Teeth and Oropharynx as per pre-operative assessment

## 2016-10-03 NOTE — Progress Notes (Signed)
  Echocardiogram Echocardiogram Transesophageal has been performed.  Scott Jacobson 10/03/2016, 9:15 AM

## 2016-10-03 NOTE — Procedures (Signed)
Extubation Procedure Note  Patient Details:   Name: Scott Jacobson DOB: 23-Jan-1951 MRN: [id number]   Airway Documentation:  Airway 8 mm (Active)  Secured at (cm) 22 cm 10/03/2016  8:23 PM  Measured From Lips 10/03/2016  8:23 PM  Secured Location Right 10/03/2016  8:23 PM  Secured By [employer] Tape 10/03/2016  8:23 PM  Site Condition Dry 10/03/2016  3:15 PM    Evaluation  O2 sats: stable throughout Complications: No apparent complications Patient did tolerate procedure well. Bilateral Breath Sounds: Clear, Diminished   Yes   Patient was extubated with no complications.  Patient performed parameters as follows: NIF -30 VC 800 mL Incentive 500 mL after multiple tries.   Carrington Clamp A 10/03/2016, 9:01 PM

## 2016-10-03 NOTE — Brief Op Note (Signed)
10/03/2016  11:15 AM  PATIENT:  Scott Jacobson  65 y.o. male  PRE-OPERATIVE DIAGNOSIS:  CAD  POST-OPERATIVE DIAGNOSIS:  CAD  PROCEDURE:  Procedure(s): CORONARY ARTERY BYPASS GRAFTING (CABG)x 4 with endoscopic harvesting of right saphenous vein (N/A) TRANSESOPHAGEAL ECHOCARDIOGRAM (TEE) (N/A) LIMA-LAD SVG-DIAG SVG-OM SVG-RCA   SURGEON:  Surgeon(s) and Role:    * Gaye Pollack, MD - Primary  PHYSICIAN ASSISTANT: Ossie Yebra PA-C  ANESTHESIA:   general  EBL:  Total I/O In: 1500 [I.V.:1000; IV Piggyback:500] Out: 125 [Blood:125]  BLOOD ADMINISTERED:none  DRAINS: ROUTINE   LOCAL MEDICATIONS USED:  NONE  SPECIMEN:  No Specimen  DISPOSITION OF SPECIMEN:  N/A  COUNTS:  YES  TOURNIQUET:  * No tourniquets in log *  DICTATION: .Other Dictation: Dictation Number PENDING  PLAN OF CARE: Admit to inpatient   PATIENT DISPOSITION:  ICU - intubated and hemodynamically stable.   Delay start of Pharmacological VTE agent (>24hrs) due to surgical blood loss or risk of bleeding: yes  COMPLICATIONS: NO KNOWN

## 2016-10-03 NOTE — Progress Notes (Signed)
RT tried to place Pt on 40% and 4 and the Pt's RR stayed 33-41. RT will try again later. RT will continue to monitor

## 2016-10-03 NOTE — Progress Notes (Addendum)
2nd attempt at wean protocol started at 1650

## 2016-10-03 NOTE — Anesthesia Preprocedure Evaluation (Signed)
Anesthesia Evaluation  Patient identified by MRN, date of birth, ID band Patient awake    Reviewed: Allergy & Precautions, NPO status , Patient's Chart, lab work & pertinent test results, reviewed documented beta blocker date and time   History of Anesthesia Complications Negative for: history of anesthetic complications  Airway Mallampati: II  TM Distance: >3 FB Neck ROM: Full    Dental  (+) Upper Dentures   Pulmonary shortness of breath, former smoker,    breath sounds clear to auscultation       Cardiovascular hypertension, Pt. on medications and Pt. on home beta blockers + angina + CAD and + Past MI   Rhythm:Regular     Neuro/Psych negative neurological ROS  negative psych ROS   GI/Hepatic GERD  Controlled,  Endo/Other  negative endocrine ROS  Renal/GU Renal disease     Musculoskeletal  (+) Arthritis ,   Abdominal   Peds  Hematology negative hematology ROS (+)   Anesthesia Other Findings   Reproductive/Obstetrics                             Anesthesia Physical Anesthesia Plan  ASA: IV  Anesthesia Plan: General   Post-op Pain Management:    Induction: Intravenous  Airway Management Planned: Oral ETT  Additional Equipment: Arterial line, TEE, CVP, PA Cath and Ultrasound Guidance Line Placement  Intra-op Plan:   Post-operative Plan: Post-operative intubation/ventilation  Informed Consent: I have reviewed the patients History and Physical, chart, labs and discussed the procedure including the risks, benefits and alternatives for the proposed anesthesia with the patient or authorized representative who has indicated his/her understanding and acceptance.   Dental advisory given  Plan Discussed with: CRNA and Surgeon  Anesthesia Plan Comments:         Anesthesia Quick Evaluation

## 2016-10-03 NOTE — H&P (Signed)
[address]       [place] [address]             [phone]      Cardiothoracic Surgery History and Physical   Reason for Consult: Left main and severe 3-vessel coronary disease s/p acute inferior STEMI Referring Physician: Dr. Lauree Chandler  Scott Jacobson is an 65 y.o. male.  HPI:   The patient is a 65 year old gentleman in fairly good health with no prior cardiac history who developed chest pain at home on 08/20/2016. He took a SL NTG that he had at home with relief. Then yesterday the pain recurred while he was 36 ft up on a ladder hanging siding. It was severe and prompted him to drive himself to [facility] ED where an ECG showed an inferior STEMI. He continued to have chest pain and was transferred to [facility] for urgent cath. His pain was almost gone down to 2/10 when he arrived. Cath showed a 99% thrombotic lesion in the mid RCA as the culprit. There was also 70% ostial and distal LM, 99% OM2 stenosis and 99% stenosis in a moderate sized diagonal. I was called to evaluate in the cath lab and after the lesion was opened with PTCA it looked much better with marked improvement in his ST elevation and pain. We planned to keep him on cangrelor overnight and do surgery today but shortly after that he developed severe chest pain and inferior ST elevation again and relook showed restenosis and it was opened and we decided to treat this with a BMS and keep on Brilinta for a month before doing CABG. Troponin went up to 50 the following morning and then decreased but his LVEF was normal on echo.  ECG shows residual of inferior STEMI. Since the procedure he has done well.  He says that he does get some chest pressure if he is out walking in the cold weather but has none at home with normal activity.   He had a retinal detachment repaired in May and had some chest pain radiating into jaw associated with nausea, diaphoresis and SOB. It sounded like angina but troponin x 1 was  negative and ECG was normal so it was felt to be GI. He says that he has not had recurrence until this recent episode.      Past Medical History:  Diagnosis Date  . Arthritis    "probably"  . BPH (benign prostatic hyperplasia)   . GERD (gastroesophageal reflux disease)   . Kidney stones   . Macular degeneration   . Seasonal allergies          Past Surgical History:  Procedure Laterality Date  . APPENDECTOMY    . BACK SURGERY    . CARDIAC CATHETERIZATION N/A 08/21/2016   Procedure: Left Heart Cath and Coronary Angiography;  Surgeon: Burnell Blanks, MD;  Location: [place] [facility];  Service: Cardiovascular;  Laterality: N/A;  . CARDIAC CATHETERIZATION N/A 08/21/2016   Procedure: Coronary Stent Intervention;  Surgeon: Burnell Blanks, MD;  Location: [place] [facility];  Service: Cardiovascular;  Laterality: N/A;  . CATARACT EXTRACTION W/ INTRAOCULAR LENS  IMPLANT, BILATERAL Bilateral   . COLONOSCOPY    . EYE SURGERY    . GAS INSERTION Right 02/05/2016   Procedure: INSERTION OF GAS-[id number];  Surgeon: Hayden Pedro, MD;  Location: [place];  Service: Ophthalmology;  Laterality: Right;  . HAND SURGERY Right    thumb; S/P "  cut his arm"  . LASER PHOTO ABLATION Right 02/05/2016   Procedure: LASER PHOTO ABLATION-HEADSCOPE LASER;  Surgeon: Hayden Pedro, MD;  Location: [place];  Service: Ophthalmology;  Laterality: Right;  . LUMBAR DISC SURGERY  ~ 2002  . RETINAL DETACHMENT REPAIR W/ SCLERAL BUCKLE LE Right 02/05/2016  . SCLERAL BUCKLE Right 02/05/2016   Procedure: SCLERAL BUCKLE;  Surgeon: Hayden Pedro, MD;  Location: [place];  Service: Ophthalmology;  Laterality: Right;  . TONSILLECTOMY           Family History  Problem Relation Age of Onset  . Hypertension Mother   . Congestive Heart Failure Father     Social History:  reports that he quit smoking about 40 years ago. His smoking use included Cigarettes. He has a 36.00 pack-year smoking  history. He has never used smokeless tobacco. He reports that he drinks alcohol. He reports that he does not use drugs.  He works for himself Air cabin crew and siding.  Allergies: No Known Allergies   Current Outpatient Prescriptions  Medication Sig Dispense Refill  . aspirin EC 81 MG tablet Take 1 tablet (81 mg total) by mouth daily.    [name] atorvastatin (LIPITOR) 80 MG tablet Take 1 tablet (80 mg total) by mouth daily at 6 PM. 30 tablet 11  . cetirizine (ZYRTEC) 10 MG tablet Take 10 mg by mouth daily.     . metoprolol tartrate (LOPRESSOR) 25 MG tablet Take 1 tablet (25 mg total) by mouth 2 (two) times daily. 60 tablet 11  . Multiple Vitamins-Minerals (ICAPS MV PO) Take 1 tablet by mouth 2 (two) times daily.     . nitroGLYCERIN (NITROSTAT) 0.4 MG SL tablet Place 1 tablet (0.4 mg total) under the tongue every 5 (five) minutes as needed for chest pain. 25 tablet 12  . pantoprazole (PROTONIX) 40 MG tablet Take 40 mg by mouth daily.    . tamsulosin (FLOMAX) 0.4 MG CAPS capsule Take 0.4 mg by mouth daily.    . ticagrelor (BRILINTA) 90 MG TABS tablet Take 1 tablet (90 mg total) by mouth 2 (two) times daily. 60 tablet 11   No current facility-administered medications for this visit.      Review of Systems  Constitutional:  Negative for chills, fever and malaise/fatigue.  HENT: Negative.   Eyes: Negative.   Respiratory: Negative  Cardiovascular: Positive for chest pain when walking in cold weather. Negative for orthopnea, leg swelling and PND.  Gastrointestinal: Negative.   Genitourinary: Negative.   Musculoskeletal: Negative.   Skin: Negative.   Neurological: Negative.   Endo/Heme/Allergies: Negative.   Psychiatric/Behavioral: Negative.    BP 114/69   Pulse 61   Resp 20   Ht 5\' 9"  (1.753 m)   Wt 186 lb (84.4 kg)   SpO2 98% Comment: RA  BMI 27.47 kg/m  Physical Exam  Constitutional: He is oriented to person, place, and time. He appears well-developed and  well-nourished. No distress.  HENT:  Head: Normocephalic and atraumatic.  Mouth/Throat: Oropharynx is clear and moist.  Eyes: EOM are normal. Pupils are equal, round, and reactive to light.  Neck: Normal range of motion. Neck supple. No JVD present. No thyromegaly present.  Cardiovascular: Normal rate, regular rhythm, normal heart sounds and intact distal pulses.   No murmur heard. Respiratory: Effort normal and breath sounds normal. No respiratory distress.  GI: Soft. Bowel sounds are normal. He exhibits no distension and no mass. There is no tenderness.  Musculoskeletal: Normal range of motion. He exhibits  no edema.  Lymphadenopathy:    He has no cervical adenopathy.  Neurological: He is alert and oriented to person, place, and time. He has normal strength. No cranial nerve deficit or sensory deficit.  Skin: Skin is warm and dry.  Psychiatric: He has a normal mood and affect.   Physicians   Panel Physicians Referring Physician Case Authorizing Physician  Burnell Blanks, MD (Primary)    Procedures   Coronary Stent Intervention  Left Heart Cath and Coronary Angiography  Conclusion     There is mild left ventricular systolic dysfunction.  LV end diastolic pressure is mildly elevated.  The left ventricular ejection fraction is 45-50% by visual estimate.  There is no mitral valve regurgitation.  Prox RCA lesion, 30 %stenosed.  Prox RCA to Mid RCA lesion, 50 %stenosed.  Ost LM to LM lesion, 70 %stenosed.  Ost 2nd Mrg to 2nd Mrg lesion, 60 %stenosed.  Mid Cx-2 lesion, 99 %stenosed.  Mid Cx-1 lesion, 60 %stenosed.  1st Diag lesion, 99 %stenosed.  Ost LAD to Prox LAD lesion, 30 %stenosed.  A STENT VISION RX 3.5X18 bare metal stent was successfully placed.  Mid RCA lesion, 99 %stenosed.  Post intervention, there is a 0% residual stenosis.  1. Acute inferior STEMI secondary to sub-totally occluded mid RCA 2. Successful PTCA/bare metal stent x 1 mid  RCA 3. Residual moderately severe distal left main stenosis, severe stenosis distal Circumflex and diagonal branch. Moderate stenosis mid Circumflex and OM as well as the proximal RCA 4. Mild LV systolic dysfunction  Recommendations: Will plan to continue ASA and Brilinta for one month along with statin. Will have CT surgery see him tomorrow. As described in the cath note, we had considered angioplasty alone of the RCA and then CABG tomorrow. After the initial angioplasty of the RCA after we had broken down the sterile field, he had recurrence of chest pain. Re-look cath with recurrence of thrombotic lesion in the RCA. We elected to place a bare metal stent in the RCA and treat him medically for one month then consider CABG. Will start beta blocker tomorrow if BP tolerates.    Indications   Acute ST elevation myocardial infarction (STEMI) involving right coronary artery (HCC) [I21.11 (ICD-10-CM)]  Procedural Details/Technique   Technical Details Indication: 65 yo male with history GERD, HTN admitted with inferior STEMI.   Procedure: The risks, benefits, complications, treatment options, and expected outcomes were discussed with the patient. Emergency consent obtained. The patient was further sedated with Versed and Fentanyl. The right wrist was prepped and draped in a sterile fashion. 1% lidocaine was used for local anesthesia. Using the modified Seldinger access technique, a 5 French sheath was placed in the right radial artery. 3 mg Verapamil was given through the sheath. Angiomax bolus given and drip started. Standard diagnostic catheters were used to perform selective coronary angiography. He was found to have sub-total thrombotic occlusion of the mid RCA. He was also found to have moderately severe distal left main stenosis. The initial plan was to open the RCA with a balloon and consider CABG.   PCI Note: JR4 guiding catheter to engage RCA. Cangrelor bolus and drip. ACT over 300. Cougar IC  wire down the RCA. 2.5 x 12 mm balloon used open the mid vessel occlusion. This balloon was inflated several times. Flow restored down the vessel. Proximal spasm noted due to catheter. At this time, we called CT surgery to come to the cath lab to discuss CABG given the distal left  main stenosis. The decision was made to treat him medically tonight with IV Cangrelor and plan CABG tomorrow. A pigtail catheter was used to perform a left ventricular angiogram. The sheath was removed from the right radial artery and a Terumo hemostasis band was applied at the arteriotomy site on the right wrist.   At this time, the patient had recurrence of chest pain and ST segment changes. The right groin was prepped and draped. A 6 French sheath was placed in the right femoral artery. A JR4 guiding catheter was used to engage the RCA. A cougar IC wire was advanced down the RCA. A 2.5 x 12 mm balloon was used to dilate the stenosis. At this time, after discussion with CT surgery, we decided to place a bare metal stent in the RCA and delay bypass of the left coronary system for one month. Brilinta 180 mg po x 1 was given. A 3.5 x 18 mm bare metal stent was deployed in the mid RCA. The stent was post-dilated with a 3.75 x 15 mm Hepler balloon x 1. The stenosis was taken from 99% down to 0%.   A moderate sized hematoma developed in the right groin with the sheath in place. Manual pressure held but there was oozing around the sheath. Cangrelor stopped. Angiomax stopped. Sheath exchanged for 7 French sheath.     Estimated blood loss <50 mL.  During this procedure no sedation was administered.    Complications   Complications documented before study signed (08/21/2016 6:55 PM EST)     Log Level Complications   None Documented by Burnell Blanks, MD 08/21/2016 6:54 PM EST  Time Range: Intra-procedure    Coronary Findings   Dominance: Right  Left Main  Ost LM to LM lesion, 70% stenosed.  Left Anterior  Descending  Ost LAD to Prox LAD lesion, 30% stenosed.  First Diagonal Branch  Vessel is moderate in size.  1st Diag lesion, 99% stenosed.  Second Diagonal Branch  Vessel is small in size.  Left Circumflex  Vessel is moderate in size.  Mid Cx-1 lesion, 60% stenosed.  Mid Cx-2 lesion, 99% stenosed.  Second Obtuse Marginal Branch  Vessel is moderate in size.  Ost 2nd Mrg to 2nd Mrg lesion, 60% stenosed.  Right Coronary Artery  Prox RCA lesion, 30% stenosed.  Prox RCA to Mid RCA lesion, 50% stenosed.  Mid RCA lesion, 99% stenosed. The lesion is thrombotic.  Angioplasty: Lesion crossed with guidewire. Pre-stent angioplasty was performed using a BALLOON EUPHORA RX2.5X12. A STENT VISION RX 3.5X18 bare metal stent was successfully placed. Stent strut is well apposed. Post-stent angioplasty was performed using a BALLOON [place] TREK RX [id number]. The pre-interventional distal flow is decreased (TIMI 1). The post-interventional distal flow is normal (TIMI 3). The intervention was successful . No complications occurred at this lesion.  There is no residual stenosis post intervention.  Wall Motion              Left Heart   Left Ventricle The left ventricular size is normal. There is mild left ventricular systolic dysfunction. LV end diastolic pressure is mildly elevated. The left ventricular ejection fraction is 45-50% by visual estimate. There are LV function abnormalities. There is no evidence of mitral regurgitation.    Coronary Diagrams   Diagnostic Diagram     Post-Intervention Diagram     Implants        Permanent Stent  Stent Vision Rx 3.5x18 [id number] - Implanted    Inventory item:  Stent Vision Rx 3.5x18 Model/Cat number: [id number]  Manufacturer: Maryan Char Lot number: [id number]  Device identifier: [id number] Device identifier type: GS1  GUDID Information   Request status Successful    Brand name: MULTI-LINK VISION Version/Model: [id number]  Company name: [employer] MRI safety info as of 08/21/16: MR Conditional  Contains dry or latex rubber: No    GMDN P.T. name: Bare-metal coronary artery stent    As of 08/21/2016   Status: Implanted      PACS Images   Show images for Cardiac catheterization   Link to Procedure Log   Procedure Log    Hemo Data   Flowsheet Row Most Recent Value  AO Systolic Pressure 99991111 mmHg  AO Diastolic Pressure 65 mmHg  AO Mean 87 mmHg  LV Systolic Pressure 123456 mmHg  LV Diastolic Pressure 7 mmHg  LV EDP 20 mmHg  Arterial Occlusion Pressure Extended Systolic Pressure 98 mmHg  Arterial Occlusion Pressure Extended Diastolic Pressure 57 mmHg  Arterial Occlusion Pressure Extended Mean Pressure 76 mmHg  Left Ventricular Apex Extended Systolic Pressure 123456 mmHg  Left Ventricular Apex Extended Diastolic Pressure 8 mmHg  Left Ventricular Apex Extended EDP Pressure 29 mmHg    Assessment/Plan:  He has high grade left main and severe 3 vessel CAD s/p inferior STEMI treated with emergent PCI with a BMS to the mid RCA. He is about one month out and would like to proceed with CABG before the end of the year. I discussed the operative procedure with the patient and his wife including alternatives, benefits and risks; including but not limited to bleeding, blood transfusion, infection, stroke, myocardial infarction, graft failure, heart block requiring a permanent pacemaker, organ dysfunction, and death.  Scott Jacobson understands and agrees to proceed.    Plan:  CABG on 10/03/2016. He will stop the Brilinta 5 days preop.   Gaye Pollack

## 2016-10-03 NOTE — OR Nursing (Signed)
[place] call made to SICU.

## 2016-10-03 NOTE — OR Nursing (Signed)
1208 First call made to SICU.

## 2016-10-04 ENCOUNTER — Inpatient Hospital Stay (HOSPITAL_COMMUNITY): Payer: PRIVATE HEALTH INSURANCE

## 2016-10-04 LAB — CREATININE, SERUM: CREATININE: 0.7 mg/dL (ref 0.61–1.24)

## 2016-10-04 LAB — GLUCOSE, CAPILLARY
GLUCOSE-CAPILLARY: 102 mg/dL — AB (ref 65–99)
GLUCOSE-CAPILLARY: 108 mg/dL — AB (ref 65–99)
GLUCOSE-CAPILLARY: 121 mg/dL — AB (ref 65–99)
GLUCOSE-CAPILLARY: 125 mg/dL — AB (ref 65–99)
GLUCOSE-CAPILLARY: 127 mg/dL — AB (ref 65–99)
GLUCOSE-CAPILLARY: 149 mg/dL — AB (ref 65–99)
GLUCOSE-CAPILLARY: 82 mg/dL (ref 65–99)
Glucose-Capillary: 76 mg/dL (ref 65–99)
Glucose-Capillary: 117 mg/dL — ABNORMAL HIGH (ref 65–99)
Glucose-Capillary: 122 mg/dL — ABNORMAL HIGH (ref 65–99)
Glucose-Capillary: 152 mg/dL — ABNORMAL HIGH (ref 65–99)
Glucose-Capillary: 125 mg/dL — ABNORMAL HIGH (ref 65–99)
Glucose-Capillary: 120 mg/dL — ABNORMAL HIGH (ref 65–99)
Glucose-Capillary: 136 mg/dL — ABNORMAL HIGH (ref 65–99)
Glucose-Capillary: 120 mg/dL — ABNORMAL HIGH (ref 65–99)
Glucose-Capillary: 125 mg/dL — ABNORMAL HIGH (ref 65–99)
Glucose-Capillary: 116 mg/dL — ABNORMAL HIGH (ref 65–99)
Glucose-Capillary: 93 mg/dL (ref 65–99)
Glucose-Capillary: 134 mg/dL — ABNORMAL HIGH (ref 65–99)
Glucose-Capillary: 105 mg/dL — ABNORMAL HIGH (ref 65–99)
Glucose-Capillary: 111 mg/dL — ABNORMAL HIGH (ref 65–99)
Glucose-Capillary: 104 mg/dL — ABNORMAL HIGH (ref 65–99)

## 2016-10-04 LAB — BASIC METABOLIC PANEL
ANION GAP: 6 (ref 5–15)
BUN: 10 mg/dL (ref 6–20)
CALCIUM: 7.6 mg/dL — AB (ref 8.9–10.3)
CO2: 23 mmol/L (ref 22–32)
Chloride: 109 mmol/L (ref 101–111)
Creatinine, Ser: 0.68 mg/dL (ref 0.61–1.24)
GLUCOSE: 111 mg/dL — AB (ref 65–99)
POTASSIUM: 3.7 mmol/L (ref 3.5–5.1)
SODIUM: 138 mmol/L (ref 135–145)

## 2016-10-04 LAB — CBC
HCT: 29.9 % — ABNORMAL LOW (ref 39.0–52.0)
HCT: 30.2 % — ABNORMAL LOW (ref 39.0–52.0)
Hemoglobin: 9.8 g/dL — ABNORMAL LOW (ref 13.0–17.0)
Hemoglobin: 9.8 g/dL — ABNORMAL LOW (ref 13.0–17.0)
MCH: 29.5 pg (ref 26.0–34.0)
MCH: 29.4 pg (ref 26.0–34.0)
MCHC: 32.8 g/dL (ref 30.0–36.0)
MCHC: 32.5 g/dL (ref 30.0–36.0)
MCV: 90.1 fL (ref 78.0–100.0)
MCV: 90.7 fL (ref 78.0–100.0)
PLATELETS: 159 10*3/uL (ref 150–400)
PLATELETS: 161 10*3/uL (ref 150–400)
RBC: 3.32 MIL/uL — AB (ref 4.22–5.81)
RBC: 3.33 MIL/uL — AB (ref 4.22–5.81)
RDW: 13.9 % (ref 11.5–15.5)
RDW: 14 % (ref 11.5–15.5)
WBC: 11.3 10*3/uL — AB (ref 4.0–10.5)
WBC: 12.2 10*3/uL — AB (ref 4.0–10.5)

## 2016-10-04 LAB — POCT I-STAT, CHEM 8
BUN: 13 mg/dL (ref 6–20)
CALCIUM ION: 1.18 mmol/L (ref 1.15–1.40)
CREATININE: 0.7 mg/dL (ref 0.61–1.24)
Chloride: 99 mmol/L — ABNORMAL LOW (ref 101–111)
GLUCOSE: 132 mg/dL — AB (ref 65–99)
HCT: 28 % — ABNORMAL LOW (ref 39.0–52.0)
HEMOGLOBIN: 9.5 g/dL — AB (ref 13.0–17.0)
POTASSIUM: 3.8 mmol/L (ref 3.5–5.1)
Sodium: 135 mmol/L (ref 135–145)
TCO2: 23 mmol/L (ref 0–100)

## 2016-10-04 LAB — MAGNESIUM
MAGNESIUM: 2.7 mg/dL — AB (ref 1.7–2.4)
MAGNESIUM: 2.5 mg/dL — AB (ref 1.7–2.4)

## 2016-10-04 MED ORDER — ENOXAPARIN SODIUM 40 MG/0.4ML ~~LOC~~ SOLN
40.0000 mg | Freq: Every day | SUBCUTANEOUS | Status: DC
  Administered 2016-10-04 – 2016-10-07 (×4): 40 mg via SUBCUTANEOUS
  Filled 2016-10-04 (×4): qty 0.4

## 2016-10-04 MED ORDER — INSULIN DETEMIR 100 UNIT/ML ~~LOC~~ SOLN
20.0000 [IU] | Freq: Every day | SUBCUTANEOUS | Status: DC
  Administered 2016-10-04 – 2016-10-05 (×2): 20 [IU] via SUBCUTANEOUS
  Filled 2016-10-04 (×2): qty 0.2

## 2016-10-04 MED ORDER — INSULIN ASPART 100 UNIT/ML ~~LOC~~ SOLN
0.0000 [IU] | SUBCUTANEOUS | Status: DC
  Administered 2016-10-04 (×2): 2 [IU] via SUBCUTANEOUS

## 2016-10-04 MED ORDER — FUROSEMIDE 10 MG/ML IJ SOLN
20.0000 mg | Freq: Once | INTRAMUSCULAR | Status: AC
  Administered 2016-10-04: 20 mg via INTRAVENOUS
  Filled 2016-10-04: qty 2

## 2016-10-04 MED ORDER — INSULIN ASPART 100 UNIT/ML ~~LOC~~ SOLN
3.0000 [IU] | Freq: Three times a day (TID) | SUBCUTANEOUS | Status: DC
  Administered 2016-10-04 – 2016-10-05 (×2): 3 [IU] via SUBCUTANEOUS

## 2016-10-04 MED ORDER — POTASSIUM CHLORIDE 2 MEQ/ML IV SOLN
30.0000 meq | Freq: Once | INTRAVENOUS | Status: AC
  Administered 2016-10-04: 30 meq via INTRAVENOUS
  Filled 2016-10-04: qty 15

## 2016-10-04 MED ORDER — KETOROLAC TROMETHAMINE 15 MG/ML IJ SOLN
15.0000 mg | Freq: Four times a day (QID) | INTRAMUSCULAR | Status: AC
  Administered 2016-10-04 (×4): 15 mg via INTRAVENOUS
  Filled 2016-10-04 (×4): qty 1

## 2016-10-04 MED ORDER — INSULIN DETEMIR 100 UNIT/ML ~~LOC~~ SOLN
20.0000 [IU] | Freq: Every day | SUBCUTANEOUS | Status: DC
  Filled 2016-10-04: qty 0.2

## 2016-10-04 MED ORDER — TAMSULOSIN HCL 0.4 MG PO CAPS
0.4000 mg | ORAL_CAPSULE | Freq: Every day | ORAL | Status: DC
  Administered 2016-10-04 – 2016-10-08 (×5): 0.4 mg via ORAL
  Filled 2016-10-04 (×5): qty 1

## 2016-10-04 MED ORDER — SODIUM CHLORIDE 0.9 % IV SOLN
30.0000 meq | Freq: Once | INTRAVENOUS | Status: AC
  Administered 2016-10-04: 30 meq via INTRAVENOUS
  Filled 2016-10-04: qty 15

## 2016-10-04 MED ORDER — ATORVASTATIN CALCIUM 80 MG PO TABS
80.0000 mg | ORAL_TABLET | Freq: Every day | ORAL | Status: DC
  Administered 2016-10-04 – 2016-10-07 (×4): 80 mg via ORAL
  Filled 2016-10-04 (×4): qty 1

## 2016-10-04 NOTE — Anesthesia Postprocedure Evaluation (Signed)
Anesthesia Post Note  Patient: Scott Jacobson  Procedure(s) Performed: Procedure(s) (LRB): CORONARY ARTERY BYPASS GRAFTING (CABG)x 4 with endoscopic harvesting of right saphenous vein ,LIMA-LAD SVG-DIAG SVG-OM SVG-RCA (N/A) TRANSESOPHAGEAL ECHOCARDIOGRAM (TEE) (N/A)  Patient location during evaluation: ICU Anesthesia Type: General Level of consciousness: sedated Pain management: pain level controlled Vital Signs Assessment: post-procedure vital signs reviewed and stable Respiratory status: patient remains intubated per anesthesia plan Cardiovascular status: stable Anesthetic complications: no       Last Vitals:  Vitals:   10/04/16 1400 10/04/16 1500  BP:    Pulse: 74 66  Resp:    Temp:      Last Pain:  Vitals:   10/04/16 1251  TempSrc: Oral  PainSc:                  Scott Jacobson

## 2016-10-04 NOTE — Progress Notes (Signed)
      [address]       [place] [address]             [phone]      No complaints  BP 112/63 (BP Location: Right Arm)   Pulse 68   Temp 98 F (36.7 C) (Oral)   Resp 16   Ht 5\' 9"  (1.753 m)   Wt 193 lb 9 oz (87.8 kg)   SpO2 94%   BMI 28.58 kg/m  3L [place]  Intake/Output Summary (Last 24 hours) at 10/04/16 1836 Last data filed at 10/04/16 1800  Gross per 24 hour  Intake          2196.26 ml  Output             1540 ml  Net           656.26 ml   K= 3.8, Hct= 28  Doing well POD # 1  Wilena Tyndall C. Roxan Hockey, MD [facility] [phone]

## 2016-10-04 NOTE — Progress Notes (Signed)
1 Day Post-Op Procedure(s) (LRB): CORONARY ARTERY BYPASS GRAFTING (CABG)x 4 with endoscopic harvesting of right saphenous vein ,LIMA-LAD SVG-DIAG SVG-OM SVG-RCA (N/A) TRANSESOPHAGEAL ECHOCARDIOGRAM (TEE) (N/A) Subjective: C/o pain, denies nausea  Objective: Vital signs in last 24 hours: Temp:  [97.2 F (36.2 C)-99.5 F (37.5 C)] 98.4 F (36.9 C) (12/30 0700) Pulse Rate:  [78-86] 80 (12/30 0700) Cardiac Rhythm: Atrial paced (12/30 0400) Resp:  [10-25] 18 (12/30 0700) BP: (95-128)/(59-77) 122/66 (12/30 0700) SpO2:  [79 %-100 %] 97 % (12/30 0700) Arterial Line BP: (81-143)/(45-66) 131/57 (12/30 0700) FiO2 (%):  [40 %-50 %] 40 % (12/29 2023) Weight:  [193 lb 9 oz (87.8 kg)] 193 lb 9 oz (87.8 kg) (12/30 0500)  Hemodynamic parameters for last 24 hours: PAP: (18-40)/(8-27) 29/14 CO:  [5 L/min-6.1 L/min] 5.5 L/min CI:  [2.5 L/min/m2-3.1 L/min/m2] 2.8 L/min/m2  Intake/Output from previous day: 12/29 0701 - 12/30 0700 In: 4007.4 [I.V.:2557.4; IV Piggyback:1450] Out: 4300 [Urine:1600; Blood:1830; Chest Tube:870] Intake/Output this shift: No intake/output data recorded.  General appearance: alert, cooperative and no distress Neurologic: intact Heart: regular rate and rhythm Lungs: diminished breath sounds bibasilar Abdomen: normal findings: soft, non-tender  Lab Results:  Recent Labs  10/03/16 1933 10/04/16 0300  WBC 13.4* 11.3*  HGB 10.4* 9.8*  HCT 31.1* 29.9*  PLT 185 159   BMET:  Recent Labs  10/01/16 1538  10/03/16 1855 10/03/16 1933 10/04/16 0300  NA 137  < > 141  --  138  K 3.6  < > 4.1  --  3.7  CL 107  < > 106  --  109  CO2 22  --   --   --  23  GLUCOSE 84  < > 132*  --  111*  BUN 13  < > 12  --  10  CREATININE 0.78  < > 0.60* 0.61 0.68  CALCIUM 8.8*  --   --   --  7.6*  < > = values in this interval not displayed.  PT/INR:  Recent Labs  10/03/16 1305  LABPROT 16.6*  INR 1.33   ABG    Component Value Date/Time   PHART 7.390 10/03/2016 2200   HCO3 21.7 10/03/2016 2200   TCO2 23 10/03/2016 2200   ACIDBASEDEF 3.0 (H) 10/03/2016 2200   O2SAT 99.0 10/03/2016 2200   CBG (last 3)   Recent Labs  10/04/16 0513 10/04/16 0627 10/04/16 0733  GLUCAP 116* 93 134*    Assessment/Plan: S/P Procedure(s) (LRB): CORONARY ARTERY BYPASS GRAFTING (CABG)x 4 with endoscopic harvesting of right saphenous vein ,LIMA-LAD SVG-DIAG SVG-OM SVG-RCA (N/A) TRANSESOPHAGEAL ECHOCARDIOGRAM (TEE) (N/A) -  CV- good hemodynamics, SR. Dc swan and arterial line  ASA, statin, beta blocker  RESP_ IS for atelectasis  RENAL- creatinine normal- volume overloaded, diurese  ENDO- CBG well controlled with drip, transition to levemir +_SSI  SCD + enoxaparin for DVT prophylaxis  DC chest tubes  mobilize   LOS: 1 day    Melrose Nakayama 10/04/2016

## 2016-10-05 ENCOUNTER — Encounter (HOSPITAL_COMMUNITY): Payer: Self-pay | Admitting: Surgery

## 2016-10-05 ENCOUNTER — Inpatient Hospital Stay (HOSPITAL_COMMUNITY): Payer: PRIVATE HEALTH INSURANCE

## 2016-10-05 LAB — GLUCOSE, CAPILLARY
GLUCOSE-CAPILLARY: 125 mg/dL — AB (ref 65–99)
GLUCOSE-CAPILLARY: 105 mg/dL — AB (ref 65–99)
Glucose-Capillary: 76 mg/dL (ref 65–99)
Glucose-Capillary: 272 mg/dL — ABNORMAL HIGH (ref 65–99)
Glucose-Capillary: 135 mg/dL — ABNORMAL HIGH (ref 65–99)

## 2016-10-05 LAB — BASIC METABOLIC PANEL
ANION GAP: 4 — AB (ref 5–15)
BUN: 13 mg/dL (ref 6–20)
CO2: 24 mmol/L (ref 22–32)
Calcium: 7.9 mg/dL — ABNORMAL LOW (ref 8.9–10.3)
Chloride: 105 mmol/L (ref 101–111)
Creatinine, Ser: 0.76 mg/dL (ref 0.61–1.24)
GFR calc Af Amer: >60 mL/min (ref >60)
GLUCOSE: 147 mg/dL — AB (ref 65–99)
POTASSIUM: 4.1 mmol/L (ref 3.5–5.1)
Sodium: 133 mmol/L — ABNORMAL LOW (ref 135–145)

## 2016-10-05 LAB — CBC
HEMATOCRIT: 27.6 % — AB (ref 39.0–52.0)
HEMOGLOBIN: 8.9 g/dL — AB (ref 13.0–17.0)
MCH: 30.4 pg (ref 26.0–34.0)
MCHC: 32.2 g/dL (ref 30.0–36.0)
MCV: 94.2 fL (ref 78.0–100.0)
Platelets: 130 10*3/uL — ABNORMAL LOW (ref 150–400)
RBC: 2.93 MIL/uL — ABNORMAL LOW (ref 4.22–5.81)
RDW: 14.6 % (ref 11.5–15.5)
WBC: 10.7 10*3/uL — AB (ref 4.0–10.5)

## 2016-10-05 MED ORDER — INSULIN ASPART 100 UNIT/ML ~~LOC~~ SOLN
0.0000 [IU] | Freq: Three times a day (TID) | SUBCUTANEOUS | Status: DC
  Administered 2016-10-05: 8 [IU] via SUBCUTANEOUS
  Administered 2016-10-06: 2 [IU] via SUBCUTANEOUS

## 2016-10-05 MED ORDER — ALUM & MAG HYDROXIDE-SIMETH 200-200-20 MG/5ML PO SUSP: 15.0000 mL | ORAL | Status: DC | PRN

## 2016-10-05 MED ORDER — ZOLPIDEM TARTRATE 5 MG PO TABS
5.0000 mg | ORAL_TABLET | Freq: Every evening | ORAL | Status: DC | PRN
  Administered 2016-10-05: 5 mg via ORAL
  Filled 2016-10-05: qty 1

## 2016-10-05 MED ORDER — MOVING RIGHT ALONG BOOK
Freq: Once | Status: DC
  Filled 2016-10-05: qty 1

## 2016-10-05 MED ORDER — KETOROLAC TROMETHAMINE 15 MG/ML IJ SOLN
15.0000 mg | Freq: Four times a day (QID) | INTRAMUSCULAR | Status: AC
  Administered 2016-10-05 – 2016-10-06 (×4): 15 mg via INTRAVENOUS
  Filled 2016-10-05 (×4): qty 1

## 2016-10-05 MED ORDER — SODIUM CHLORIDE 0.9% FLUSH: 3.0000 mL | INTRAVENOUS | Status: DC | PRN

## 2016-10-05 MED ORDER — SODIUM CHLORIDE 0.9% FLUSH
3.0000 mL | Freq: Two times a day (BID) | INTRAVENOUS | Status: DC
  Administered 2016-10-05 – 2016-10-08 (×5): 3 mL via INTRAVENOUS

## 2016-10-05 MED ORDER — MAGNESIUM HYDROXIDE 400 MG/5ML PO SUSP: 30.0000 mL | Freq: Every day | ORAL | Status: DC | PRN

## 2016-10-05 MED ORDER — SODIUM CHLORIDE 0.9 % IV SOLN: 250.0000 mL | INTRAVENOUS | Status: DC | PRN

## 2016-10-05 NOTE — Progress Notes (Signed)
Report attempted. RN not answering phone. Left callback number with [place].

## 2016-10-05 NOTE — Progress Notes (Signed)
2 Days Post-Op Procedure(s) (LRB): CORONARY ARTERY BYPASS GRAFTING (CABG)x 4 with endoscopic harvesting of right saphenous vein ,LIMA-LAD SVG-DIAG SVG-OM SVG-RCA (N/A) TRANSESOPHAGEAL ECHOCARDIOGRAM (TEE) (N/A) Subjective: Pain is better. C/o room being cold all night  Objective: Vital signs in last 24 hours: Temp:  [97.4 F (36.3 C)-98.8 F (37.1 C)] 98.8 F (37.1 C) (12/31 0732) Pulse Rate:  [61-75] 75 (12/31 0900) Cardiac Rhythm: Normal sinus rhythm (12/31 0800) Resp:  [13-16] 16 (12/31 0000) BP: (96-127)/(43-75) 127/56 (12/31 0900) SpO2:  [91 %-99 %] 96 % (12/31 0900) Weight:  [192 lb 14.4 oz (87.5 kg)] 192 lb 14.4 oz (87.5 kg) (12/31 0500)  Hemodynamic parameters for last 24 hours:    Intake/Output from previous day: 12/30 0701 - 12/31 0700 In: 2608.6 [P.O.:870; I.V.:1123.6; IV Piggyback:615] Out: [id number] [Urine:1215] Intake/Output this shift: Total I/O In: 120 [P.O.:60; I.V.:60] Out: -   General appearance: alert, cooperative and no distress Neurologic: intact Heart: regular rate and rhythm Lungs: diminished breath sounds bibasilar Abdomen: normal findings: soft, non-tender  Lab Results:  Recent Labs  10/04/16 0825 10/04/16 1750 10/05/16 0437  WBC 12.2*  --  10.7*  HGB 9.8* 9.5* 8.9*  HCT 30.2* 28.0* 27.6*  PLT 161  --  130*   BMET:  Recent Labs  10/04/16 0300  10/04/16 1750 10/05/16 0437  NA 138  --  135 133*  K 3.7  --  3.8 4.1  CL 109  --  99* 105  CO2 23  --   --  24  GLUCOSE 111*  --  132* 147*  BUN 10  --  13 13  CREATININE 0.68  < > 0.70 0.76  CALCIUM 7.6*  --   --  7.9*  < > = values in this interval not displayed.  PT/INR:  Recent Labs  10/03/16 1305  LABPROT 16.6*  INR 1.33   ABG    Component Value Date/Time   PHART 7.390 10/03/2016 2200   HCO3 21.7 10/03/2016 2200   TCO2 23 10/04/2016 1750   ACIDBASEDEF 3.0 (H) 10/03/2016 2200   O2SAT 99.0 10/03/2016 2200   CBG (last 3)   Recent Labs  10/04/16 2314 10/05/16 0407  10/05/16 0728  GLUCAP 127* 76 125*    Assessment/Plan: S/P Procedure(s) (LRB): CORONARY ARTERY BYPASS GRAFTING (CABG)x 4 with endoscopic harvesting of right saphenous vein ,LIMA-LAD SVG-DIAG SVG-OM SVG-RCA (N/A) TRANSESOPHAGEAL ECHOCARDIOGRAM (TEE) (N/A) Plan for transfer to step-down: see transfer orders  POD # 2 Doing well overall  CV- stable in SR  RESP- atelectasis- IS  RENAL- creatinine and lytes oK  ENDO- CBG well controlled- change to AC/HS  Continue cardiac rehab   LOS: 2 days    Melrose Nakayama 10/05/2016

## 2016-10-05 NOTE — Progress Notes (Signed)
Report given to receiving RN on 2W. Will transfer pt to 2w36 shortly.

## 2016-10-06 ENCOUNTER — Inpatient Hospital Stay (HOSPITAL_COMMUNITY): Payer: PRIVATE HEALTH INSURANCE

## 2016-10-06 LAB — BASIC METABOLIC PANEL
Anion gap: 6 (ref 5–15)
BUN: 16 mg/dL (ref 6–20)
CALCIUM: 8.1 mg/dL — AB (ref 8.9–10.3)
CO2: 25 mmol/L (ref 22–32)
CREATININE: 0.85 mg/dL (ref 0.61–1.24)
Chloride: 105 mmol/L (ref 101–111)
GFR calc Af Amer: >60 mL/min (ref >60)
GLUCOSE: 104 mg/dL — AB (ref 65–99)
Potassium: 4 mmol/L (ref 3.5–5.1)
Sodium: 136 mmol/L (ref 135–145)

## 2016-10-06 LAB — GLUCOSE, CAPILLARY
GLUCOSE-CAPILLARY: 134 mg/dL — AB (ref 65–99)
Glucose-Capillary: 92 mg/dL (ref 65–99)
Glucose-Capillary: 132 mg/dL — ABNORMAL HIGH (ref 65–99)
Glucose-Capillary: 95 mg/dL (ref 65–99)

## 2016-10-06 LAB — CBC
HEMATOCRIT: 25.5 % — AB (ref 39.0–52.0)
Hemoglobin: 8.3 g/dL — ABNORMAL LOW (ref 13.0–17.0)
MCH: 29.4 pg (ref 26.0–34.0)
MCHC: 32.5 g/dL (ref 30.0–36.0)
MCV: 90.4 fL (ref 78.0–100.0)
Platelets: 143 10*3/uL — ABNORMAL LOW (ref 150–400)
RBC: 2.82 MIL/uL — ABNORMAL LOW (ref 4.22–5.81)
RDW: 14 % (ref 11.5–15.5)
WBC: 10.7 10*3/uL — ABNORMAL HIGH (ref 4.0–10.5)

## 2016-10-06 MED ORDER — POTASSIUM CHLORIDE CRYS ER 20 MEQ PO TBCR
20.0000 meq | EXTENDED_RELEASE_TABLET | Freq: Every day | ORAL | Status: DC
  Administered 2016-10-06 – 2016-10-08 (×3): 20 meq via ORAL
  Filled 2016-10-06 (×3): qty 1

## 2016-10-06 MED ORDER — FUROSEMIDE 40 MG PO TABS
40.0000 mg | ORAL_TABLET | Freq: Every day | ORAL | Status: DC
  Administered 2016-10-06 – 2016-10-08 (×3): 40 mg via ORAL
  Filled 2016-10-06 (×3): qty 1

## 2016-10-06 NOTE — Progress Notes (Addendum)
[address]       [place] [address]             [phone]      3 Days Post-Op Procedure(s) (LRB): CORONARY ARTERY BYPASS GRAFTING (CABG)x 4 with endoscopic harvesting of right saphenous vein ,LIMA-LAD SVG-DIAG SVG-OM SVG-RCA (N/A) TRANSESOPHAGEAL ECHOCARDIOGRAM (TEE) (N/A) Subjective: Feels pretty well overall  Objective: Vital signs in last 24 hours: Temp:  [97.6 F (36.4 C)-99.5 F (37.5 C)] 97.6 F (36.4 C) (01/01 0559) Pulse Rate:  [42-89] 75 (01/01 0935) Cardiac Rhythm: Normal sinus rhythm (12/31 2039) Resp:  [18] 18 (01/01 0559) BP: (102-121)/(49-71) 117/56 (01/01 0935) SpO2:  [67 %-100 %] 92 % (01/01 0559) Weight:  [198 lb 12.8 oz (90.2 kg)] 198 lb 12.8 oz (90.2 kg) (01/01 0559)  Hemodynamic parameters for last 24 hours:    Intake/Output from previous day: 12/31 0701 - 01/01 0700 In: 510 [P.O.:310; I.V.:200] Out: 953 [Urine:950; Stool:3] Intake/Output this shift: No intake/output data recorded.  General appearance: alert, cooperative and no distress Heart: regular rate and rhythm Lungs: mildly dim in bases Abdomen: benign Extremities: + edema Wound: incis healing well  Lab Results:  Recent Labs  10/05/16 0437 10/06/16 0235  WBC 10.7* 10.7*  HGB 8.9* 8.3*  HCT 27.6* 25.5*  PLT 130* 143*   BMET:  Recent Labs  10/05/16 0437 10/06/16 0235  NA 133* 136  K 4.1 4.0  CL 105 105  CO2 24 25  GLUCOSE 147* 104*  BUN 13 16  CREATININE 0.76 0.85  CALCIUM 7.9* 8.1*    PT/INR:  Recent Labs  10/03/16 1305  LABPROT 16.6*  INR 1.33   ABG    Component Value Date/Time   PHART 7.390 10/03/2016 2200   HCO3 21.7 10/03/2016 2200   TCO2 23 10/04/2016 1750   ACIDBASEDEF 3.0 (H) 10/03/2016 2200   O2SAT 99.0 10/03/2016 2200   CBG (last 3)   Recent Labs  10/05/16 1642 10/05/16 2112 10/06/16 0628  GLUCAP 272* 135* 92    Meds Scheduled Meds: . acetaminophen  1,000 mg Oral Q6H   Or  . acetaminophen (TYLENOL) oral  liquid 160 mg/5 mL  1,000 mg Per Tube Q6H  . aspirin EC  325 mg Oral Daily   Or  . aspirin  324 mg Per Tube Daily  . atorvastatin  80 mg Oral q1800  . bisacodyl  10 mg Oral Daily   Or  . bisacodyl  10 mg Rectal Daily  . docusate sodium  200 mg Oral Daily  . enoxaparin (LOVENOX) injection  40 mg Subcutaneous QHS  . insulin aspart  0-15 Units Subcutaneous TID WC  . metoprolol tartrate  12.5 mg Oral BID   Or  . metoprolol tartrate  12.5 mg Per Tube BID  . moving right along book   Does not apply Once  . pantoprazole  40 mg Oral Daily  . sodium chloride flush  3 mL Intravenous Q12H  . tamsulosin  0.4 mg Oral Daily   Continuous Infusions: PRN Meds:.sodium chloride, alum & mag hydroxide-simeth, magnesium hydroxide, ondansetron (ZOFRAN) IV, oxyCODONE, sodium chloride flush, traMADol, zolpidem  Xrays Dg Chest 2 View  Result Date: 10/06/2016 CLINICAL DATA:  Pt c/o general chest soreness since having open heart surgery a few days ago and some sob; eval atelectasis per pt's chart. EXAM: CHEST  2 VIEW COMPARISON:  10/05/2016 FINDINGS: Since the previous exam, the right jugular Cordis has been removed. Basilar opacity has mildly improved on  the right, similar in a left, consistent with a combination of pleural fluid and atelectasis. This remains most apparent on the left. No new lung abnormalities. Specifically, no pulmonary edema. No pneumothorax. No mediastinal widening. Cardiac silhouette is mildly enlarged stable. IMPRESSION: 1. No acute findings or evidence of an operative complication. Specifically no pulmonary edema, pneumothorax or mediastinal widening. 2. Mild improvement in right base atelectasis. No other change. Persistent left greater right pleural effusions with left lung base atelectasis. Electronically Signed   By: Lajean Manes M.D.   On: 10/06/2016 08:01   Dg Chest Port 1 View  Result Date: 10/05/2016 CLINICAL DATA:  66 year old male status post CABG. Initial encounter. EXAM:  PORTABLE CHEST 1 VIEW COMPARISON:  10/04/2016 and earlier. FINDINGS: Portable AP semi upright view at 0517 hours. Right IJ approach Swan-Ganz catheter removed, right IJ introducer sheath remains. Left chest tube and mediastinal tubes removed. No pneumothorax. Stable cardiac size and mediastinal contours. Mildly improved bibasilar ventilation especially on the right. No pulmonary edema. Possible small pleural effusions. Sequelae of CABG. Epicardial pacer wires remain. IMPRESSION: 1. Left chest tube and mediastinal tube removed with no pneumothorax. Swan-Ganz catheter removed, right IJ introducer sheath remains. 2. Regressed basilar atelectasis.  Probable small pleural effusions. Electronically Signed   By: Genevie Ann M.D.   On: 10/05/2016 07:26    Assessment/Plan: S/P Procedure(s) (LRB): CORONARY ARTERY BYPASS GRAFTING (CABG)x 4 with endoscopic harvesting of right saphenous vein ,LIMA-LAD SVG-DIAG SVG-OM SVG-RCA (N/A) TRANSESOPHAGEAL ECHOCARDIOGRAM (TEE) (N/A)  1 doing well 2 add diuretic for mild fluid overload. small effusions 3 H/H pretty stable ABL anemia- monitor 4 sugars quite variable- cont SSI 5 hemodyn stable in sinus rhythm 6 push rehab/pulm toilet as able  LOS: 3 days    GOLD,WAYNE E 10/06/2016 Patient seen and examined, agree with above He has really just had one extremely high CBG (272) yesterday, has not had any other checks that approached anywhere near that level. Monitor   Revonda Standard. Roxan Hockey, MD [facility] [phone]

## 2016-10-06 NOTE — Progress Notes (Signed)
Pt arrived to [place] placed on tele and oriented to the room. Vital signs stable. Plan of care reviewed with pt. Pt's wife also called and updated about his transfer. Will continue to follow.

## 2016-10-07 LAB — GLUCOSE, CAPILLARY
GLUCOSE-CAPILLARY: 116 mg/dL — AB (ref 65–99)
Glucose-Capillary: 113 mg/dL — ABNORMAL HIGH (ref 65–99)
Glucose-Capillary: 113 mg/dL — ABNORMAL HIGH (ref 65–99)

## 2016-10-07 MED FILL — Heparin Sodium (Porcine) Inj 1000 Unit/ML: INTRAMUSCULAR | Qty: 30 | Status: AC

## 2016-10-07 MED FILL — Magnesium Sulfate Inj 50%: INTRAMUSCULAR | Qty: 10 | Status: AC

## 2016-10-07 MED FILL — Potassium Chloride Inj 2 mEq/ML: INTRAVENOUS | Qty: 40 | Status: AC

## 2016-10-07 NOTE — Discharge Instructions (Signed)

## 2016-10-07 NOTE — Progress Notes (Signed)
Pacing wires removed. Bed rest for 1 hour. Vitals take. No issues at present.  Oleda Borski, Mervin Kung RN

## 2016-10-07 NOTE — Progress Notes (Signed)
CARDIAC REHAB PHASE I   PRE:  Rate/Rhythm: 81 SR  BP:  Sitting: 129/66        SaO2: 99 RA  MODE:  Ambulation: 550 ft   POST:  Rate/Rhythm: 81 SR  BP:  Sitting: 141/64         SaO2: 98 RA  Pt states he has walked some independently, agreeable to walk. Pt ambulated 550 ft on RA, independent, steady gait, tolerated well. Pt c/o mild DOE, declined rest stop, denies any other complaints. Encouraged IS, additional ambulation as tolerated. Pt to edge of bed per pt request (was previsously up in recliner) after walk, call bell within reach. Will follow.   [id number] Lenna Sciara, RN, BSN 10/07/2016 10:13 AM

## 2016-10-07 NOTE — Progress Notes (Addendum)
      [address]       [place] [address]             [phone]      4 Days Post-Op Procedure(s) (LRB): CORONARY ARTERY BYPASS GRAFTING (CABG)x 4 with endoscopic harvesting of right saphenous vein ,LIMA-LAD SVG-DIAG SVG-OM SVG-RCA (N/A) TRANSESOPHAGEAL ECHOCARDIOGRAM (TEE) (N/A) Subjective: No issues overnight. Pain well controlled on oral medication.  Objective: Vital signs in last 24 hours: Temp:  [98.2 F (36.8 C)-98.3 F (36.8 C)] 98.3 F (36.8 C) (01/02 0605) Pulse Rate:  [75-91] 91 (01/02 0605) Cardiac Rhythm: Normal sinus rhythm (01/01 2020) Resp:  [17-18] 18 (01/02 0605) BP: (112-139)/(56-70) 139/70 (01/02 0605) SpO2:  [93 %-96 %] 93 % (01/02 0605) Weight:  [186 lb (84.4 kg)] 186 lb (84.4 kg) (01/02 0605)     Intake/Output from previous day: No intake/output data recorded. Intake/Output this shift: No intake/output data recorded.  General appearance: alert, cooperative and no distress Heart: regular rate and rhythm Lungs: clear to auscultation bilaterally Abdomen: soft, non-tender; bowel sounds normal; no masses,  no organomegaly Extremities: extremities normal, atraumatic, no cyanosis or edema Wound: clean and dry  Lab Results:  Recent Labs  10/05/16 0437 10/06/16 0235  WBC 10.7* 10.7*  HGB 8.9* 8.3*  HCT 27.6* 25.5*  PLT 130* 143*   BMET:  Recent Labs  10/05/16 0437 10/06/16 0235  NA 133* 136  K 4.1 4.0  CL 105 105  CO2 24 25  GLUCOSE 147* 104*  BUN 13 16  CREATININE 0.76 0.85  CALCIUM 7.9* 8.1*    PT/INR: No results for input(s): LABPROT, INR in the last 72 hours. ABG    Component Value Date/Time   PHART 7.390 10/03/2016 2200   HCO3 21.7 10/03/2016 2200   TCO2 23 10/04/2016 1750   ACIDBASEDEF 3.0 (H) 10/03/2016 2200   O2SAT 99.0 10/03/2016 2200   CBG (last 3)   Recent Labs  10/06/16 1624 10/06/16 2050 10/07/16 0607  GLUCAP 95 134* 113*    Assessment/Plan: S/P Procedure(s) (LRB): CORONARY ARTERY  BYPASS GRAFTING (CABG)x 4 with endoscopic harvesting of right saphenous vein ,LIMA-LAD SVG-DIAG SVG-OM SVG-RCA (N/A) TRANSESOPHAGEAL ECHOCARDIOGRAM (TEE) (N/A)  1. CV-NSR 90s. Rhythm stable. Will remove EPW today. BP well controlled on current regimen.  2. Pulm-CXR showed no acute findings. Mild atelectasis on the right. L > R pleural effusion. Tolerating room air. Encourage incentive spirometry.  3. Renal- creatinine 0.85. Electrolytes okay. Continue lasix 40mg  daily. Positive fluid balance yesterday. Does not appear fluid overloaded on exam.  4. Endo-good blood glucose control.  5. H and H, platelets okay  Plan: Making good progress. Continue TID ambulation.  Discontinue pacing wires. Encourage Incentive spirometry.    LOS: 4 days    Elgie Collard 10/07/2016  He is progressing well. Probably ready for discharge tomorrow. Glucose under good control and Hgb A1c 5.6 preop. Will stop CBG's. I will discuss anti-platelet agents with Dr. Angelena Form since he had recent stent.

## 2016-10-07 NOTE — Discharge Summary (Signed)
Physician Discharge Summary  Patient ID: Scott Jacobson MRN: [id number] DOB/AGE: 11/11/1950 66 y.o.  Admit date: 10/03/2016 Discharge date: 10/08/2016  Admission Diagnoses: Patient Active Problem List   Diagnosis Date Noted  . S/P CABG x 4 10/03/2016  . Coronary artery disease involving native heart without angina pectoris 08/23/2016  . HLD (hyperlipidemia) 08/23/2016  . Acute ST elevation myocardial infarction (STEMI) of inferior wall ([place]) 08/21/2016  . Acute ST elevation myocardial infarction (STEMI) involving right coronary artery ([place])   . Nausea and vomiting 02/06/2016  . Macular degeneration   . BPH (benign prostatic hyperplasia)   . Rhegmatogenous retinal detachment of right eye 02/05/2016     Discharge Diagnoses:  Active Problems:   S/P CABG x 4   Discharged Condition: good  HPI:  The patient is a 66 year old gentleman in fairly good health with no prior cardiac history who developed chest pain at home on 08/20/2016. He took a SL NTG that he had at home with relief. Then yesterday the pain recurred while he was 36 ft up on a ladder hanging siding. It was severe and prompted him to drive himself to [facility] ED where an ECG showed an inferior STEMI. He continued to have chest pain and was transferred to [facility] for urgent cath. His pain was almost gone down to 2/10 when he arrived. Cath showed a 99% thrombotic lesion in the mid RCA as the culprit. There was also 70% ostial and distal LM, 99% OM2 stenosis and 99% stenosis in a moderate sized diagonal. I was called to evaluate in the cath lab and after the lesion was opened with PTCA it looked much better with marked improvement in his ST elevation and pain. We planned to keep him on cangrelor overnight and do surgery today but shortly after that he developed severe chest pain and inferior ST elevation again and relook showed restenosis and it was opened and we decided to treat this with a BMS and keep on Brilinta for a month before doing  CABG. Troponin went up to 50 the following morning and then decreased but his LVEF was normal on echo.  ECG shows residual of inferior STEMI. Since the procedure he has done well.  He says that he does get some chest pressure if he is out walking in the cold weather but has none at home with normal activity.   He had a retinal detachment repaired in May and had some chest pain radiating into jaw associated with nausea, diaphoresis and SOB. It sounded like angina but troponin x 1 was negative and ECG was normal so it was felt to be GI. He says that he has not had recurrence until this recent episode.   Hospital Course:  [name] underwent a coronary bypass grafting 4 by Dr. Gilford Raid on 10/03/2016. He tolerated the procedure well, was transferred to the ICU. He was extubated in a timely manner. On postop day 1 we were able to discontinue the arterial line in the Swan-Ganz catheter. We initiated beta-blockade for better heart rate and blood pressure control. He continued to use his incentive spirometry for postoperative atelectasis. His creatinine remained normal although he was fluid overloaded. We initiated diuretic regimen. We were able to discontinue his chest tubes at this time. On postoperative day 2 he continued to progress. Overall he was doing quite well. We plan for transfer to stepdown unit. Postop day 3 he remained hemodynamically stable and in normal sinus rhythm. He continued to have mild fluid  overload with small pleural effusions. We continued his diuretic regimen. On postop day 4 his pain was well-controlled with oral medication. We discontinued his epicardial pacing wires. He continued to work with cardiac rehabilitation to increase mobilization and strength endurance. Today he is tolerating room air, ambulating with limited assistance, his incisions are healing well, and he is ready for discharge.  Consults: None  Significant Diagnostic Studies:  CLINICAL DATA:  Pt c/o general  chest soreness since having open heart surgery a few days ago and some sob; eval atelectasis per pt's chart.  EXAM: CHEST  2 VIEW  COMPARISON:  10/05/2016  FINDINGS: Since the previous exam, the right jugular Cordis has been removed.  Basilar opacity has mildly improved on the right, similar in a left, consistent with a combination of pleural fluid and atelectasis. This remains most apparent on the left. No new lung abnormalities. Specifically, no pulmonary edema. No pneumothorax.  No mediastinal widening. Cardiac silhouette is mildly enlarged stable.  IMPRESSION: 1. No acute findings or evidence of an operative complication. Specifically no pulmonary edema, pneumothorax or mediastinal widening. 2. Mild improvement in right base atelectasis. No other change. Persistent left greater right pleural effusions with left lung base atelectasis.  Treatments:   CARDIOVASCULAR SURGERY OPERATIVE NOTE  10/03/2016  Surgeon:  Gaye Pollack, MD  First Assistant: Jadene Pierini,  PA-C   Preoperative Diagnosis:  Left main and severe multi-vessel coronary artery disease   Postoperative Diagnosis:  Same   Procedure:  1. Median Sternotomy 2. Extracorporeal circulation 3.   Coronary artery bypass grafting x 4   Left internal mammary graft to the LAD  SVG to diagonal  SVG to OM  SVG to PL RCA 4.   Endoscopic vein harvest from the right leg   Anesthesia:  General Endotracheal   Clinical History/Surgical Indication:  The patient is a 66 year old gentleman in fairly good health with no prior cardiac history who developed chest pain at home on 08/20/2016. He took a SL NTG that he had at home with relief. Then yesterday the pain recurred while he was 36 ft up on a ladder hanging siding. It was severe and prompted him to drive himself to [facility] ED where an ECG showed an inferior STEMI. He continued to have chest pain and was transferred to [facility] for urgent  cath. His pain was almost gone down to 2/10 when he arrived. Cath showed a 99% thrombotic lesion in the mid RCA as the culprit. There was also 70% ostial and distal LM, 99% OM2 stenosis and 99% stenosis in a moderate sized diagonal. I was called to evaluate in the cath lab and after the lesion was opened with PTCA it looked much better with marked improvement in his ST elevation and pain. We planned to keep him on cangrelor overnight and do surgery today but shortly after that he developed severe chest pain and inferior ST elevation again and relook showed restenosis and it was opened and we decided to treat this with a BMS and keep on Brilinta for a month before doing CABG. Troponin went up to 50 the following morning and then decreased but his LVEF was normal on echo.ECG shows residual of inferior STEMI. Since the procedure he has done well. He says that he does get some chest pressure if he is out walking in the cold weather but has none at home with normal activity.   In summary he has high grade left main and severe 3 vessel CAD s/p inferior  STEMI treated with emergent PCI with a BMS to the mid RCA. He is about one month out and would like to proceed with CABG before the end of the year. I discussed the operative procedure with the patient and his wife including alternatives, benefits and risks; including but not limited to bleeding, blood transfusion, infection, stroke, myocardial infarction, graft failure, heart block requiring a permanent pacemaker, organ dysfunction, and death. Quin Hoop Mooreunderstands and agrees to proceed.   Discharge Exam: Blood pressure 130/63, pulse 64, temperature 98 F (36.7 C), temperature source Oral, resp. rate 18, height 5\' 9"  (1.753 m), weight 191 lb 12.8 oz (87 kg), SpO2 94 %.   Cardiovascular: RRR Pulmonary: Clear to auscultation bilaterally Abdomen: Soft, non tender, bowel sounds present. Extremities: Trace lower extremity edema. Wounds: Clean and dry.  No  erythema or signs of infection.  Disposition: 01-Home or Self Care  Discharge Instructions    Amb Referral to Cardiac Rehabilitation    Complete by:  As directed    Diagnosis:  CABG   CABG X ___:  4     Allergies as of 10/08/2016      Reactions   No Known Allergies       Medication List    STOP taking these medications   nitroGLYCERIN 0.4 MG SL tablet Commonly known as:  NITROSTAT   ticagrelor 90 MG Tabs tablet Commonly known as:  BRILINTA     TAKE these medications   aspirin EC 81 MG tablet Take 1 tablet (81 mg total) by mouth daily.   atorvastatin 80 MG tablet Commonly known as:  LIPITOR Take 1 tablet (80 mg total) by mouth daily at 6 PM.   cetirizine 10 MG tablet Commonly known as:  ZYRTEC Take 10 mg by mouth daily at 12 noon.   clopidogrel 75 MG tablet Commonly known as:  PLAVIX Take 1 tablet (75 mg total) by mouth daily.   furosemide 40 MG tablet Commonly known as:  LASIX Take 1 tablet (40 mg total) by mouth daily. For 4 days then stop.   ICAPS MV PO Take 1 tablet by mouth 2 (two) times daily.   lisinopril 2.5 MG tablet Commonly known as:  PRINIVIL,ZESTRIL Take 1 tablet (2.5 mg total) by mouth daily.   metoprolol tartrate 25 MG tablet Commonly known as:  LOPRESSOR Take 1 tablet (25 mg total) by mouth 2 (two) times daily.   pantoprazole 40 MG tablet Commonly known as:  PROTONIX Take 40 mg by mouth daily.   potassium chloride SA 20 MEQ tablet Commonly known as:  K-DUR,KLOR-CON Take 1 tablet (20 mEq total) by mouth daily. For 4 days then stop.   REFRESH OP Apply 1 drop to eye 4 (four) times daily as needed (dry eyes).   tamsulosin 0.4 MG Caps capsule Commonly known as:  FLOMAX Take 0.4 mg by mouth daily.   traMADol 50 MG tablet Commonly known as:  ULTRAM Take 1 tablet (50 mg total) by mouth every 4 (four) hours as needed for moderate pain.      Follow-up Information    Gaye Pollack, MD Follow up.   Specialty:  Cardiothoracic  Surgery Why:  Your appointment is on 11/05/2016 at 2:00pm. Please arrive at 1:30pm for a chest xray at [facility] located on the first floor of our building.  Contact information: [address] [place] [address] [phone]        Neale Burly, MD. Call in 1 day(s).   Specialty:  Internal Medicine Contact information: [address] [place] [id number] [phone]        Lyda Jester, PA-C Follow up on 10/30/2016.   Specialties:  Cardiology, Radiology Why:  Appointment time is at 3:30 pm Contact information: [place] [address] [phone]        Nurse Follow up on 10/14/2016.   Why:  Appoinment is with nurse only for chest tube sutures removal. Appointment time is at 10:30 am Contact information: [place] [address]         The patient has been discharged on:   1.Beta Blocker:  Yes [  x ]                              No   [   ]                              If No, reason:  2.Ace Inhibitor/ARB: Yes [  x ]                                     No  [   ]                                     If No, reason:   3.Statin:   Yes [ x  ]                  No  [   ]                  If No, reason:  4.Ecasa:  Yes  [ x  ]                  No   [   ]                  If No, reason:   Signed: Macedonio Scallon M PA-C 10/08/2016, 9:54 AM

## 2016-10-07 NOTE — Care Management Important Message (Signed)
Important Message  Patient Details  Name: Scott Jacobson MRN: [id number] Date of Birth: 11/13/1950   Medicare Important Message Given:  Yes    Nathen May 10/07/2016, 2:39 PM

## 2016-10-08 LAB — GLUCOSE, CAPILLARY: Glucose-Capillary: 108 mg/dL — ABNORMAL HIGH (ref 65–99)

## 2016-10-08 MED ORDER — LISINOPRIL 2.5 MG PO TABS
2.5000 mg | ORAL_TABLET | Freq: Every day | ORAL | 1 refills | Status: DC
Start: 2016-10-08 — End: 2017-01-22

## 2016-10-08 MED ORDER — TRAMADOL HCL 50 MG PO TABS: 50.0000 mg | ORAL_TABLET | ORAL | 0 refills | Status: DC | PRN

## 2016-10-08 MED ORDER — FUROSEMIDE 40 MG PO TABS: 40.0000 mg | ORAL_TABLET | Freq: Every day | ORAL | 0 refills | Status: DC

## 2016-10-08 MED ORDER — METOPROLOL TARTRATE 25 MG PO TABS
25.0000 mg | ORAL_TABLET | Freq: Two times a day (BID) | ORAL | Status: DC
  Administered 2016-10-08: 25 mg via ORAL
  Filled 2016-10-08: qty 1

## 2016-10-08 MED ORDER — POTASSIUM CHLORIDE CRYS ER 20 MEQ PO TBCR: 20.0000 meq | EXTENDED_RELEASE_TABLET | Freq: Every day | ORAL | 0 refills | Status: DC

## 2016-10-08 MED ORDER — ASPIRIN 325 MG PO TBEC: 325.0000 mg | DELAYED_RELEASE_TABLET | Freq: Every day | ORAL | 0 refills | Status: DC

## 2016-10-08 MED ORDER — CLOPIDOGREL BISULFATE 75 MG PO TABS: 75.0000 mg | ORAL_TABLET | Freq: Every day | ORAL | 1 refills | Status: DC

## 2016-10-08 MED ORDER — LISINOPRIL 2.5 MG PO TABS
2.5000 mg | ORAL_TABLET | Freq: Every day | ORAL | Status: DC
  Administered 2016-10-08: 2.5 mg via ORAL
  Filled 2016-10-08: qty 1

## 2016-10-08 MED ORDER — METOPROLOL TARTRATE 25 MG/10 ML ORAL SUSPENSION
25.0000 mg | Freq: Two times a day (BID) | ORAL | Status: DC
  Filled 2016-10-08: qty 10

## 2016-10-08 NOTE — Progress Notes (Addendum)
      [address]       [place] [address]             [phone]        5 Days Post-Op Procedure(s) (LRB): CORONARY ARTERY BYPASS GRAFTING (CABG)x 4 with endoscopic harvesting of right saphenous vein ,LIMA-LAD SVG-DIAG SVG-OM SVG-RCA (N/A) TRANSESOPHAGEAL ECHOCARDIOGRAM (TEE) (N/A)  Subjective: Patient without specific complaints this am. He is eating breakfast.  Objective: Vital signs in last 24 hours: Temp:  [98 F (36.7 C)-98.3 F (36.8 C)] 98 F (36.7 C) (01/03 0536) Pulse Rate:  [64-82] 64 (01/03 0536) Cardiac Rhythm: Normal sinus rhythm (01/03 0745) Resp:  [18] 18 (01/03 0536) BP: (119-171)/(56-68) 130/63 (01/03 0536) SpO2:  [93 %-95 %] 94 % (01/03 0536) Weight:  [191 lb 12.8 oz (87 kg)] 191 lb 12.8 oz (87 kg) (01/03 0536)  Pre op weight 87.5 kg Current Weight  10/08/16 191 lb 12.8 oz (87 kg)       Intake/Output from previous day: No intake/output data recorded.   Physical Exam:  Cardiovascular: RRR Pulmonary: Clear to auscultation bilaterally Abdomen: Soft, non tender, bowel sounds present. Extremities: Trace lower extremity edema. Wounds: Clean and dry.  No erythema or signs of infection.  Lab Results: CBC: Recent Labs  10/06/16 0235  WBC 10.7*  HGB 8.3*  HCT 25.5*  PLT 143*   BMET:  Recent Labs  10/06/16 0235  NA 136  K 4.0  CL 105  CO2 25  GLUCOSE 104*  BUN 16  CREATININE 0.85  CALCIUM 8.1*    PT/INR:  Lab Results  Component Value Date   INR 1.33 10/03/2016   INR 0.99 10/01/2016   INR 1.14 08/21/2016   ABG:  INR: Will add last result for INR, ABG once components are confirmed Will add last 4 CBG results once components are confirmed  Assessment/Plan:  1. CV - PVCs/SR in the 80's this am. On Lopressor 12.5 mg bid. Will start low dose Lisinopril for better BP control and increase Lopressor to 25 mg bid as taken pre op. Per Dr. Cyndia Bent, baby ecasa and Plavix-no need to restart Brillinta. 2.  Pulmonary -  On room air. 3. Volume Overload - On Lasix 40 mg daily 4.  Acute blood loss anemia - Last H and H 8.3 and 25.5 5. Remove sutures 6. Discharge later today  ZIMMERMAN,DONIELLE MPA-C 10/08/2016,7:58 AM  He looks good and ready for discharge. Plan to use ASA and Plavix instead of Brilinta postop. Keep chest tube sutures in and remove in office.

## 2016-10-08 NOTE — Care Management Note (Addendum)
Case Management Note Marvetta Gibbons RN, BSN Unit 2W-Case Manager [phone]  Patient Details  Name: Scott Jacobson MRN: [id number] Date of Birth: 1951/04/14  Subjective/Objective:  Pt admitted s/p CABGx4 - tx from ICU to 2W on 10/05/16              Action/Plan: PTA pt lived at home with spouse- plan to return home with spouse- no CM needs noted for discharge  Expected Discharge Date:    10/08/16              Expected Discharge Plan:  Home/Self Care  In-House Referral:     Discharge planning Services  CM Consult  Post Acute Care Choice:  NA Choice offered to:  NA  DME Arranged:    DME Agency:     HH Arranged:    [place] Agency:     Status of Service:  Completed, signed off  If discussed at [employer] of Stay Meetings, dates discussed:    Additional Comments:  Dawayne Patricia, RN 10/08/2016, 10:22 AM

## 2016-10-08 NOTE — Progress Notes (Signed)
[id number] Education completed with pt who voiced understanding. Encouraged IS and sternal precautions. Discussed CRP 2 and will refer to [place]. Encouraged heart healthy diet. Put on discharge video for pt to view. Gave ex ed. Graylon Good RN BSN 10/08/2016 8:51 AM

## 2016-10-14 ENCOUNTER — Telehealth: Payer: Self-pay | Admitting: Surgical

## 2016-10-14 MED ORDER — FUROSEMIDE 40 MG PO TABS: 40.0000 mg | ORAL_TABLET | Freq: Every day | ORAL | 0 refills | Status: DC

## 2016-10-14 MED ORDER — POTASSIUM CHLORIDE CRYS ER 20 MEQ PO TBCR: 20.0000 meq | EXTENDED_RELEASE_TABLET | Freq: Every day | ORAL | 0 refills | Status: DC

## 2016-10-14 NOTE — Telephone Encounter (Signed)
      [address]       [place] [address]             [phone]       Contacted the patient is morning for follow-up since discharge. He reports that he is having some increasing shortness of breath of 6 over 10 in severity. He has not had any increase in weight. At time of discharge he was sent on 4 days of Lasix and potassium. He did have some small pleural effusions at time of discharge. I think he will probably benefit from a continued course of Lasix and potassium so E prescribed 14 days of Lasix 40 mg daily and 20 mEq of potassium chloride daily. He overall feels as though he is doing well. He will call office if he has any further difficulties and he is aware of scheduled follow-up appointments.   Rowen Wilmer E, PA-C

## 2016-10-15 ENCOUNTER — Other Ambulatory Visit: Payer: Self-pay | Admitting: *Deleted

## 2016-10-15 DIAGNOSIS — G8918 Other acute postprocedural pain: Secondary | ICD-10-CM

## 2016-10-15 MED ORDER — TRAMADOL HCL 50 MG PO TABS: 50.0000 mg | ORAL_TABLET | ORAL | 0 refills | Status: DC | PRN

## 2016-10-15 NOTE — Progress Notes (Signed)
Scott Jacobson had CABG X 4 on 10/03/16 and was discharged on 10/08/16. He has called today to request a refill for Tramadol. I informed him that I would have Dr. Cyndia Bent sign a new script and would fax it to his pharmacy. His wife understood these instructions.

## 2016-10-16 DIAGNOSIS — I2111 ST elevation (STEMI) myocardial infarction involving right coronary artery: Secondary | ICD-10-CM | POA: Diagnosis not present

## 2016-10-16 DIAGNOSIS — K210 Gastro-esophageal reflux disease with esophagitis: Secondary | ICD-10-CM | POA: Diagnosis not present

## 2016-10-16 DIAGNOSIS — I25798 Atherosclerosis of other coronary artery bypass graft(s) with other forms of angina pectoris: Secondary | ICD-10-CM | POA: Diagnosis not present

## 2016-10-16 DIAGNOSIS — I10 Essential (primary) hypertension: Secondary | ICD-10-CM | POA: Diagnosis not present

## 2016-10-17 ENCOUNTER — Encounter (INDEPENDENT_AMBULATORY_CARE_PROVIDER_SITE_OTHER): Payer: Self-pay | Admitting: *Deleted

## 2016-10-17 DIAGNOSIS — Z951 Presence of aortocoronary bypass graft: Secondary | ICD-10-CM

## 2016-10-23 ENCOUNTER — Encounter (HOSPITAL_COMMUNITY): Payer: Self-pay | Admitting: Surgery

## 2016-10-23 ENCOUNTER — Other Ambulatory Visit: Payer: Self-pay | Admitting: *Deleted

## 2016-10-23 DIAGNOSIS — G8918 Other acute postprocedural pain: Secondary | ICD-10-CM

## 2016-10-23 LAB — ECHO TEE
AOASC: 3 cm
STJ: 2.7 cm
Sinus: 3.3 cm

## 2016-10-23 MED ORDER — TRAMADOL HCL 50 MG PO TABS: 50.0000 mg | ORAL_TABLET | ORAL | 0 refills | Status: DC | PRN

## 2016-10-23 NOTE — Telephone Encounter (Signed)
Mrs. Barnfield has called requesting a refill for her husband, Scott Jacobson, who had a CABG ON 10/03/16. His last refill for Tramadol was 10/16/16. I  Informed her that a script would be faxed to their pharmacy today and she agreed.

## 2016-10-30 ENCOUNTER — Encounter: Payer: Self-pay | Admitting: Cardiology

## 2016-10-30 ENCOUNTER — Ambulatory Visit (INDEPENDENT_AMBULATORY_CARE_PROVIDER_SITE_OTHER): Payer: Medicare Other | Admitting: Cardiology

## 2016-10-30 VITALS — BP 120/74 | HR 80 | Ht 69.0 in | Wt 174.0 lb

## 2016-10-30 DIAGNOSIS — I2510 Atherosclerotic heart disease of native coronary artery without angina pectoris: Secondary | ICD-10-CM | POA: Diagnosis not present

## 2016-10-30 NOTE — Progress Notes (Signed)
10/30/2016 [name]   1951/06/22  [id number]  Primary Physician Neale Burly, MD Primary Cardiologist: Dr. Angelena Form    Reason for Visit/CC: f/u for CAD s/p CABG   HPI:   [name] is a 66 y/o male who presents to clinic today for post hospital f/u after undergoing CABG.  He was first admitted to [facility] on 08/21/16 with a STEMI. Emergent [place] revealed multivessel CAD with culprit vessel being a 99% occluded RCA treated with PCI utilizing a BMS. He was placed on DAPT with ASA + Brilinta. He also had severe residual CAD with the intent of having him undergo CABG, per Dr. Cyndia Bent, after 4 weeks of DAPT for bare metal RCA stent. He had residual, moderately to severe distal left main stenosis, severe stenosis distal Circumflex and diagonal branch. Moderate stenosis of the mid Circumflex and OM as well as the proximal RCA. Echo revealed normal LVEF at 60-65%.   After 4 weeks of DAPT for RCA BMS, he presented back to [facility] on 10/03/16 to undergo CABG. He underwent CABG x 4 with a LIMA-LAD, SVG-Diag, SVG-OM and SVG to RCA. His post operative course was uneventfully. No documented issues with afib.   He presents back to clinic for f/u. He is doing well. No recurrent chest pain. No dyspnea. He denies palpitations. No exertional symptoms. He reports full medication compliance. VSS. BP is well controlled at 120/74. HR 80 bpm.     Current Meds  Medication Sig  . aspirin EC 81 MG tablet Take 1 tablet (81 mg total) by mouth daily.  [name] atorvastatin (LIPITOR) 80 MG tablet Take 1 tablet (80 mg total) by mouth daily at 6 PM.  . cetirizine (ZYRTEC) 10 MG tablet Take 10 mg by mouth daily at 12 noon.   . clopidogrel (PLAVIX) 75 MG tablet Take 1 tablet (75 mg total) by mouth daily.  [name] lisinopril (PRINIVIL,ZESTRIL) 2.5 MG tablet Take 1 tablet (2.5 mg total) by mouth daily.  . metoprolol tartrate (LOPRESSOR) 25 MG tablet Take 1 tablet (25 mg total) by mouth 2 (two) times daily.  . Multiple Vitamins-Minerals  (ICAPS MV PO) Take 1 tablet by mouth 2 (two) times daily.   . pantoprazole (PROTONIX) 40 MG tablet Take 40 mg by mouth daily.  . Polyvinyl Alcohol-Povidone (REFRESH OP) Apply 1 drop to eye 4 (four) times daily as needed (dry eyes).  . tamsulosin (FLOMAX) 0.4 MG CAPS capsule Take 0.4 mg by mouth daily.  . traMADol (ULTRAM) 50 MG tablet Take 1 tablet (50 mg total) by mouth every 4 (four) hours as needed for moderate pain.   Allergies  Allergen Reactions  . No Known Allergies    Past Medical History:  Diagnosis Date  . Anginal pain ([place])   . Arthritis    "probably"  . BPH (benign prostatic hyperplasia)   . Coronary artery disease   . Dyspnea   . GERD (gastroesophageal reflux disease)   . History of kidney stones   . Hypertension   . Kidney stones   . Macular degeneration   . Myocardial infarction   . Seasonal allergies    Family History  Problem Relation Age of Onset  . Hypertension Mother   . Congestive Heart Failure Father    Past Surgical History:  Procedure Laterality Date  . APPENDECTOMY    . BACK SURGERY    . CARDIAC CATHETERIZATION N/A 08/21/2016   Procedure: Left Heart Cath and Coronary Angiography;  Surgeon: Burnell Blanks, MD;  Location: [facility]  INVASIVE CV LAB;  Service: Cardiovascular;  Laterality: N/A;  . CARDIAC CATHETERIZATION N/A 08/21/2016   Procedure: Coronary Stent Intervention;  Surgeon: Burnell Blanks, MD;  Location: [place] CV LAB;  Service: Cardiovascular;  Laterality: N/A;  . CATARACT EXTRACTION W/ INTRAOCULAR LENS  IMPLANT, BILATERAL Bilateral   . COLONOSCOPY    . CORONARY ARTERY BYPASS GRAFT N/A 10/03/2016   Procedure: CORONARY ARTERY BYPASS GRAFTING (CABG)x 4 with endoscopic harvesting of right saphenous vein ,LIMA-LAD SVG-DIAG SVG-OM SVG-RCA;  Surgeon: Gaye Pollack, MD;  Location: [place];  Service: Open Heart Surgery;  Laterality: N/A;  . EYE SURGERY    . GAS INSERTION Right 02/05/2016   Procedure: INSERTION OF GAS-[id number];  Surgeon:  Hayden Pedro, MD;  Location: [place];  Service: Ophthalmology;  Laterality: Right;  . HAND SURGERY Right    thumb; S/P "cut his arm"  . LASER PHOTO ABLATION Right 02/05/2016   Procedure: LASER PHOTO ABLATION-HEADSCOPE LASER;  Surgeon: Hayden Pedro, MD;  Location: [place];  Service: Ophthalmology;  Laterality: Right;  . LUMBAR DISC SURGERY  ~ 2002  . RETINAL DETACHMENT REPAIR W/ SCLERAL BUCKLE LE Right 02/05/2016  . SCLERAL BUCKLE Right 02/05/2016   Procedure: SCLERAL BUCKLE;  Surgeon: Hayden Pedro, MD;  Location: [place];  Service: Ophthalmology;  Laterality: Right;  . TEE WITHOUT CARDIOVERSION N/A 10/03/2016   Procedure: TRANSESOPHAGEAL ECHOCARDIOGRAM (TEE);  Surgeon: Gaye Pollack, MD;  Location: [place];  Service: Open Heart Surgery;  Laterality: N/A;  . TONSILLECTOMY     Social History   Social History  . Marital status: Married    Spouse name: N/A  . Number of children: N/A  . Years of education: N/A   Occupational History  . Not on file.   Social History Main Topics  . Smoking status: Former Smoker    Packs/day: 3.00    Years: 12.00    Types: Cigarettes    Quit date: 02/04/1976  . Smokeless tobacco: Never Used  . Alcohol use Yes     Comment: 02/06/2016 "a beer a couple times/month"  . Drug use: No  . Sexual activity: Not Currently   Other Topics Concern  . Not on file   Social History Narrative  . No narrative on file     Review of Systems: General: negative for chills, fever, night sweats or weight changes.  Cardiovascular: negative for chest pain, dyspnea on exertion, edema, orthopnea, palpitations, paroxysmal nocturnal dyspnea or shortness of breath Dermatological: negative for rash Respiratory: negative for cough or wheezing Urologic: negative for hematuria Abdominal: negative for nausea, vomiting, diarrhea, bright red blood per rectum, melena, or hematemesis Neurologic: negative for visual changes, syncope, or dizziness All other systems reviewed and are  otherwise negative except as noted above.   Physical Exam:  Blood pressure 120/74, pulse 80, height 5\' 9"  (1.753 m), weight 174 lb (78.9 kg).  General appearance: alert, cooperative and no distress Neck: no carotid bruit and no JVD Lungs: clear to auscultation bilaterally Heart: regular rate and rhythm, S1, S2 normal, no murmur, click, rub or gallop Extremities: extremities normal, atraumatic, no cyanosis or edema Pulses: 2+ and symmetric Skin: Skin color, texture, turgor normal. No rashes or lesions Neurologic: Grossly normal  EKG Not performed   ASSESSMENT AND PLAN:   1. CAD: s/p s/p STEMI 08/21/16. Emergent [place] revealed multivessel CAD with culprit vessel being a 99% occluded RCA treated with PCI utilizing a BMS (bare metal stent chosen given need for CABG). After 4 weeks  of DAPT, he underwent CABG x 4 with LIMA-LAD, SVG-Diag, SVG-OM ans SVG to RCA. He is stable w/o recurrent angina. VSS. Continue ASA, Plavix, Lipitor, metoprolol and lisinopril. Pt encouraged to start cardiac rehab, once cleared by Dr. Cyndia Bent. He has f/u with him next week.  2. HTN: BP is controlled on current regimen. Lisinopril was added at time of recent discharge. We will check a f/u CMP to assess renal function and K. Continue metoprolol as well.   3. HLD: LDL during admission for MI was 128. Goal LDL is < 70 mg/dL. We will check a FLP and CMP to assess hepatic function.    PLAN  F/u with Dr. Angelena Form in 3-4 months.   Brittainy Simmons PA-C 10/30/2016 4:20 PM

## 2016-10-30 NOTE — Patient Instructions (Addendum)
Medication Instructions:   Your physician recommends that you continue on your current medications as directed. Please refer to the Current Medication list given to you today. \ If you need a refill on your cardiac medications before your next appointment, please call your pharmacy.  Labwork: PRESCRIPTION  HAS BEEN WRITTEN FOR YOUR LAB WORK PLEASE MAKE SURE THOSE RESULTS ARE FAXED BACK TO [phone]   Testing/Procedures: NONE ORDERED  TODAY    Follow-Up: IN [place] IN 3  MONTHS    Any Other Special Instructions Will Be Listed Below (If Applicable).

## 2016-11-03 ENCOUNTER — Telehealth: Payer: Self-pay | Admitting: Cardiovascular Disease

## 2016-11-03 DIAGNOSIS — I2510 Atherosclerotic heart disease of native coronary artery without angina pectoris: Secondary | ICD-10-CM | POA: Diagnosis not present

## 2016-11-03 NOTE — Telephone Encounter (Signed)
New message  Pt call requesting to speak with RN. Pt states is incision is draining and would like to speak with RN for further instructions. Please call back to discuss

## 2016-11-03 NOTE — Telephone Encounter (Signed)
New message   leslie reid Calling from [facility] is calling because the she needs the diagnostic clarification code

## 2016-11-03 NOTE — Telephone Encounter (Signed)
I spoke with Scott Jacobson in lab at [facility]. They need diagnosis code for lab work pt had done there today.  Code provided.

## 2016-11-03 NOTE — Telephone Encounter (Signed)
I spoke with pt. He reports drainage from leg incision.  I asked him to contact Dr.Bartle's office to discuss this.  Phone number provided.

## 2016-11-04 ENCOUNTER — Other Ambulatory Visit: Payer: Self-pay | Admitting: Surgery

## 2016-11-04 DIAGNOSIS — Z951 Presence of aortocoronary bypass graft: Secondary | ICD-10-CM

## 2016-11-05 ENCOUNTER — Ambulatory Visit
Admission: RE | Admit: 2016-11-05 | Discharge: 2016-11-05 | Disposition: A | Discharge status: Home / Self Care | Payer: Medicare Other | Source: Ambulatory Visit | Attending: Surgery | Admitting: Surgery

## 2016-11-05 ENCOUNTER — Ambulatory Visit (INDEPENDENT_AMBULATORY_CARE_PROVIDER_SITE_OTHER): Payer: Self-pay | Admitting: Surgery

## 2016-11-05 ENCOUNTER — Encounter: Payer: Self-pay | Admitting: Surgery

## 2016-11-05 VITALS — BP 138/83 | HR 70 | Temp 96.9°F | Resp 20 | Ht 69.0 in | Wt 174.0 lb

## 2016-11-05 DIAGNOSIS — J90 Pleural effusion, not elsewhere classified: Secondary | ICD-10-CM | POA: Diagnosis not present

## 2016-11-05 DIAGNOSIS — IMO0001 Reserved for inherently not codable concepts without codable children: Secondary | ICD-10-CM

## 2016-11-05 DIAGNOSIS — T814XXA Infection following a procedure, initial encounter: Secondary | ICD-10-CM

## 2016-11-05 DIAGNOSIS — Z951 Presence of aortocoronary bypass graft: Secondary | ICD-10-CM

## 2016-11-05 DIAGNOSIS — I2510 Atherosclerotic heart disease of native coronary artery without angina pectoris: Secondary | ICD-10-CM

## 2016-11-05 MED ORDER — CEPHALEXIN 500 MG PO CAPS: 500.0000 mg | ORAL_CAPSULE | Freq: Three times a day (TID) | ORAL | 0 refills | Status: AC

## 2016-11-05 NOTE — Progress Notes (Signed)
HPI: Patient returns for routine postoperative follow-up having undergone CABG x 4 on 10/03/2016. The patient's early postoperative recovery while in the hospital was notable for an uncomplicated postop course. Since hospital discharge the patient reports that he has been feeling well. He is walking without chest pain or shortness of breath. He does note that he developed some redness and swelling around his right lower leg vein harvest incision over the past few days and it started draining some old bloody fluid yesterday. He has not had any significant discomfort, no fever.   Current Outpatient Prescriptions  Medication Sig Dispense Refill  . aspirin EC 81 MG tablet Take 1 tablet (81 mg total) by mouth daily.    [name] atorvastatin (LIPITOR) 80 MG tablet Take 1 tablet (80 mg total) by mouth daily at 6 PM. 30 tablet 11  . cephALEXin (KEFLEX) 500 MG capsule Take 1 capsule (500 mg total) by mouth 3 (three) times daily. 21 capsule 0  . cetirizine (ZYRTEC) 10 MG tablet Take 10 mg by mouth daily at 12 noon.     . clopidogrel (PLAVIX) 75 MG tablet Take 1 tablet (75 mg total) by mouth daily. 30 tablet 1  . lisinopril (PRINIVIL,ZESTRIL) 2.5 MG tablet Take 1 tablet (2.5 mg total) by mouth daily. 30 tablet 1  . metoprolol tartrate (LOPRESSOR) 25 MG tablet Take 1 tablet (25 mg total) by mouth 2 (two) times daily. 60 tablet 11  . Multiple Vitamins-Minerals (ICAPS MV PO) Take 1 tablet by mouth 2 (two) times daily.     . pantoprazole (PROTONIX) 40 MG tablet Take 40 mg by mouth daily.    . Polyvinyl Alcohol-Povidone (REFRESH OP) Apply 1 drop to eye 4 (four) times daily as needed (dry eyes).    . tamsulosin (FLOMAX) 0.4 MG CAPS capsule Take 0.4 mg by mouth daily.    . traMADol (ULTRAM) 50 MG tablet Take 1 tablet (50 mg total) by mouth every 4 (four) hours as needed for moderate pain. 30 tablet 0   No current facility-administered medications for this visit.     Physical Exam: BP 138/83   Pulse 70    Temp (!) 96.9 F (36.1 C) (Oral)   Resp 20   Ht 5\' 9"  (1.753 m)   Wt 174 lb (78.9 kg)   SpO2 97% Comment: RA  BMI 25.70 kg/m  He looks well. Lung exam is clear. Cardiac exam shows a regular rate and rhythm with normal heart sounds. Chest incision is healing well and sternum is stable. The leg incision has some erythema and swelling beneath it with a small opening in the middle with a small amount of old bloody-appearing drainage. I was not able to express much drainage. There is no edema in the legs.   Diagnostic Tests:  CLINICAL DATA:  S/p CABG 10/03/2016, no chest complaints today, stat reading, pt gone to office now for appt  EXAM: CHEST  2 VIEW  COMPARISON:  10/06/2016  FINDINGS: Midline trachea. Patient rotated minimally left. Prior median sternotomy. Mild cardiomegaly with transverse aortic atherosclerosis. Trace right pleural fluid is only readily apparent on the lateral view. No pneumothorax. No congestive failure. Left perihilar subsegmental atelectasis.  IMPRESSION: Small right pleural effusion.  Cardiomegaly without congestive failure.   Electronically Signed   By: Abigail Miyamoto M.D.   On: 11/05/2016 13:33   Impression:  Overall I think he is doing well. I encouraged him to continue walking. He is planning to participate in cardiac rehab at [facility] and we will make the referral today. I think he can get started. He has some mild cellulitis around the right leg vein harvest incision and I will start him on Keflex 500 mg tid x 7 days. I told him he could drive his car but should not lift anything heavier than 10 lbs for three months postop.   Plan:  Start Keflex 500 mg tid x 7 days.  Keep the leg incision clean and covered with dry dressing.  I will see him back in one week for follow-up of his leg incision.  He can start cardiac rehab.    Gaye Pollack, MD [facility] [phone]

## 2016-11-06 DIAGNOSIS — Z87891 Personal history of nicotine dependence: Secondary | ICD-10-CM | POA: Diagnosis not present

## 2016-11-06 DIAGNOSIS — T827XXA Infection and inflammatory reaction due to other cardiac and vascular devices, implants and grafts, initial encounter: Secondary | ICD-10-CM | POA: Diagnosis not present

## 2016-11-06 DIAGNOSIS — Z7902 Long term (current) use of antithrombotics/antiplatelets: Secondary | ICD-10-CM | POA: Diagnosis not present

## 2016-11-06 DIAGNOSIS — I10 Essential (primary) hypertension: Secondary | ICD-10-CM | POA: Diagnosis not present

## 2016-11-06 DIAGNOSIS — Z79899 Other long term (current) drug therapy: Secondary | ICD-10-CM | POA: Diagnosis not present

## 2016-11-06 DIAGNOSIS — L7622 Postprocedural hemorrhage and hematoma of skin and subcutaneous tissue following other procedure: Secondary | ICD-10-CM | POA: Diagnosis not present

## 2016-11-06 DIAGNOSIS — I2510 Atherosclerotic heart disease of native coronary artery without angina pectoris: Secondary | ICD-10-CM | POA: Diagnosis not present

## 2016-11-06 DIAGNOSIS — Z7982 Long term (current) use of aspirin: Secondary | ICD-10-CM | POA: Diagnosis not present

## 2016-11-06 DIAGNOSIS — I97611 Postprocedural hemorrhage and hematoma of a circulatory system organ or structure following cardiac bypass: Secondary | ICD-10-CM | POA: Diagnosis not present

## 2016-11-06 DIAGNOSIS — T8131XA Disruption of external operation (surgical) wound, not elsewhere classified, initial encounter: Secondary | ICD-10-CM | POA: Diagnosis not present

## 2016-11-06 DIAGNOSIS — L089 Local infection of the skin and subcutaneous tissue, unspecified: Secondary | ICD-10-CM | POA: Diagnosis not present

## 2016-11-06 DIAGNOSIS — K219 Gastro-esophageal reflux disease without esophagitis: Secondary | ICD-10-CM | POA: Diagnosis not present

## 2016-11-06 DIAGNOSIS — I252 Old myocardial infarction: Secondary | ICD-10-CM | POA: Diagnosis not present

## 2016-11-12 ENCOUNTER — Ambulatory Visit (INDEPENDENT_AMBULATORY_CARE_PROVIDER_SITE_OTHER): Payer: Self-pay | Admitting: Surgery

## 2016-11-12 ENCOUNTER — Encounter: Payer: Self-pay | Admitting: Surgery

## 2016-11-12 VITALS — BP 136/88 | HR 75 | Resp 16 | Ht 69.0 in | Wt 174.0 lb

## 2016-11-12 DIAGNOSIS — Z951 Presence of aortocoronary bypass graft: Secondary | ICD-10-CM

## 2016-11-12 DIAGNOSIS — T814XXA Infection following a procedure, initial encounter: Secondary | ICD-10-CM

## 2016-11-12 DIAGNOSIS — I2510 Atherosclerotic heart disease of native coronary artery without angina pectoris: Secondary | ICD-10-CM

## 2016-11-12 DIAGNOSIS — IMO0001 Reserved for inherently not codable concepts without codable children: Secondary | ICD-10-CM

## 2016-11-12 NOTE — Progress Notes (Signed)
     HPI:  Scott Jacobson returns today for examination of his right leg vein harvest site. He developed cellulits and drainage from the wound and I put him on Keflex for 7 days. He denies any fever or chills. He did have some bleeding from the wound after changing a dressing but it stopped with pressure.  Current Outpatient Prescriptions  Medication Sig Dispense Refill  . aspirin EC 81 MG tablet Take 1 tablet (81 mg total) by mouth daily.    [name] atorvastatin (LIPITOR) 80 MG tablet Take 1 tablet (80 mg total) by mouth daily at 6 PM. 30 tablet 11  . cephALEXin (KEFLEX) 500 MG capsule Take 1 capsule (500 mg total) by mouth 3 (three) times daily. 21 capsule 0  . cetirizine (ZYRTEC) 10 MG tablet Take 10 mg by mouth daily at 12 noon.     . clopidogrel (PLAVIX) 75 MG tablet Take 1 tablet (75 mg total) by mouth daily. 30 tablet 1  . lisinopril (PRINIVIL,ZESTRIL) 2.5 MG tablet Take 1 tablet (2.5 mg total) by mouth daily. 30 tablet 1  . metoprolol tartrate (LOPRESSOR) 25 MG tablet Take 1 tablet (25 mg total) by mouth 2 (two) times daily. 60 tablet 11  . Multiple Vitamins-Minerals (ICAPS MV PO) Take 1 tablet by mouth 2 (two) times daily.     . pantoprazole (PROTONIX) 40 MG tablet Take 40 mg by mouth daily.    . Polyvinyl Alcohol-Povidone (REFRESH OP) Apply 1 drop to eye 4 (four) times daily as needed (dry eyes).    . tamsulosin (FLOMAX) 0.4 MG CAPS capsule Take 0.4 mg by mouth daily.    . traMADol (ULTRAM) 50 MG tablet Take 1 tablet (50 mg total) by mouth every 4 (four) hours as needed for moderate pain. 30 tablet 0   No current facility-administered medications for this visit.      Physical Exam: BP 136/88 (BP Location: Right Arm, Patient Position: Sitting, Cuff Size: Normal)   Pulse 75   Resp 16   Ht 5\' 9"  (1.753 m)   Wt 174 lb (78.9 kg)   SpO2 98% Comment: RA  BMI 25.70 kg/m  He looks well The right leg would looks good. The skin edges have separated but the wound is granulating well. The  surrounding erythema has resolved. There is no drainage.  Diagnostic Tests:  None today  Impression:  The leg incision cellulitis is resolved. He will keep a dressing over the site until healed in which should be within a couple weeks.  Plan:  He will continue to followup with his PCP and Dr. Angelena Form.   Gaye Pollack, MD [facility] [phone]

## 2016-12-11 ENCOUNTER — Encounter (HOSPITAL_COMMUNITY): Payer: Self-pay

## 2016-12-11 ENCOUNTER — Encounter (HOSPITAL_COMMUNITY)
Admission: RE | Admit: 2016-12-11 | Discharge: 2016-12-11 | Disposition: A | Discharge status: Home / Self Care | Payer: Medicare Other | Source: Ambulatory Visit | Attending: Cardiovascular Disease | Admitting: Cardiovascular Disease

## 2016-12-11 VITALS — BP 142/76 | HR 63 | Ht 69.0 in | Wt 178.8 lb

## 2016-12-11 DIAGNOSIS — Z951 Presence of aortocoronary bypass graft: Secondary | ICD-10-CM | POA: Diagnosis not present

## 2016-12-11 DIAGNOSIS — I2510 Atherosclerotic heart disease of native coronary artery without angina pectoris: Secondary | ICD-10-CM | POA: Insufficient documentation

## 2016-12-11 DIAGNOSIS — I2119 ST elevation (STEMI) myocardial infarction involving other coronary artery of inferior wall: Secondary | ICD-10-CM

## 2016-12-11 NOTE — Progress Notes (Signed)
Cardiac/Pulmonary Rehab Medication Review by a Pharmacist  Does the patient  feel that his/her medications are working for him/her?  yes  Has the patient been experiencing any side effects to the medications prescribed?  no  Does the patient measure his/her own blood pressure or blood glucose at home?  yes   Does the patient have any problems obtaining medications due to transportation or finances?   no  Understanding of regimen: good Understanding of indications: good Potential of compliance: good  Questions asked to Determine Patient Understanding of Medication Regimen:  1. What is the name of the medication?  2. What is the medication used for?  3. When should it be taken?  4. How much should be taken?  5. How will you take it?  6. What side effects should you report?  Understanding Defined as: Excellent: All questions above are correct Good: Questions 1-4 are correct Fair: Questions 1-2 are correct  Poor: 1 or none of the above questions are correct   Pharmacist comments: Pt does not c/o of any side effects except for occasional numbness and tingling in hands which he attributes to the "blood thinner".  No problems otherwise.  Med list is up to date.  Pt does check BP at home and recommended he keep a journal to show MD during appts.  Hart Robinsons A 12/11/2016 10:30 AM

## 2016-12-11 NOTE — Progress Notes (Deleted)
Cardiac Individual Treatment Plan  Patient Details  Name: Scott Jacobson MRN: [id number] Date of Birth: 04-17-1951 Referring Provider:   Flowsheet Row CARDIAC REHAB PHASE II ORIENTATION from 12/11/2016 in [facility]  Referring Provider  Dr. Angelena Form      Initial Encounter Date:  Flowsheet Row CARDIAC REHAB PHASE II ORIENTATION from 12/11/2016 in [place] [facility]  Date  12/11/16  Referring Provider  Dr. Angelena Form      Visit Diagnosis: S/P CABG x 4  ST elevation myocardial infarction (STEMI) involving other coronary artery of inferior wall ([place])  Patient's Home Medications on Admission:  Current Outpatient Prescriptions:  .  aspirin EC 81 MG tablet, Take 1 tablet (81 mg total) by mouth daily., Disp: , Rfl:  .  atorvastatin (LIPITOR) 80 MG tablet, Take 1 tablet (80 mg total) by mouth daily at 6 PM., Disp: 30 tablet, Rfl: 11 .  cetirizine (ZYRTEC) 10 MG tablet, Take 10 mg by mouth daily at 12 noon. , Disp: , Rfl:  .  clopidogrel (PLAVIX) 75 MG tablet, Take 1 tablet (75 mg total) by mouth daily., Disp: 30 tablet, Rfl: 1 .  lisinopril (PRINIVIL,ZESTRIL) 2.5 MG tablet, Take 1 tablet (2.5 mg total) by mouth daily., Disp: 30 tablet, Rfl: 1 .  metoprolol tartrate (LOPRESSOR) 25 MG tablet, Take 1 tablet (25 mg total) by mouth 2 (two) times daily., Disp: 60 tablet, Rfl: 11 .  Multiple Vitamins-Minerals (ICAPS MV PO), Take 1 tablet by mouth 2 (two) times daily. , Disp: , Rfl:  .  pantoprazole (PROTONIX) 40 MG tablet, Take 40 mg by mouth daily., Disp: , Rfl:  .  Polyvinyl Alcohol-Povidone (REFRESH OP), Apply 1 drop to eye 4 (four) times daily as needed (dry eyes)., Disp: , Rfl:  .  tamsulosin (FLOMAX) 0.4 MG CAPS capsule, Take 0.4 mg by mouth daily., Disp: , Rfl:  .  traMADol (ULTRAM) 50 MG tablet, Take 1 tablet (50 mg total) by mouth every 4 (four) hours as needed for moderate pain., Disp: 30 tablet, Rfl: 0  Past Medical History: Past Medical History:   Diagnosis Date  . Anginal pain ([place])   . Arthritis    "probably"  . BPH (benign prostatic hyperplasia)   . Coronary artery disease   . Dyspnea   . GERD (gastroesophageal reflux disease)   . History of kidney stones   . Hypertension   . Kidney stones   . Macular degeneration   . Myocardial infarction   . Seasonal allergies     Tobacco Use: History  Smoking Status  . Former Smoker  . Packs/day: 3.00  . Years: 16.00  . Types: Cigarettes  . Quit date: 02/04/1979  Smokeless Tobacco  . Never Used    Labs: Recent Review Flowsheet Data    Labs for ITP Cardiac and Pulmonary Rehab Latest Ref Rng & Units 10/03/2016 10/03/2016 10/03/2016 10/03/2016 10/04/2016   Cholestrol 0 - 200 mg/dL - - - - -   LDLCALC 0 - 99 mg/dL - - - - -   HDL >40 mg/dL - - - - -   Trlycerides <150 mg/dL - - - - -   Hemoglobin A1c 4.8 - 5.6 % - - - - -   PHART 7.350 - 7.450 7.308(L) - 7.349(L) 7.390 -   PCO2ART 32.0 - 48.0 mmHg 43.1 - 39.3 36.0 -   HCO3 20.0 - 28.0 mmol/L 21.6 - 21.6 21.7 -   TCO2 0 - 100 mmol/L 23 23 23 23  23  ACIDBASEDEF 0.0 - 2.0 mmol/L 4.0(H) - 4.0(H) 3.0(H) -   O2SAT % 98.0 - 99.0 99.0 -      Capillary Blood Glucose: Lab Results  Component Value Date   GLUCAP 108 (H) 10/08/2016   GLUCAP 116 (H) 10/07/2016   GLUCAP 113 (H) 10/07/2016   GLUCAP 113 (H) 10/07/2016   GLUCAP 134 (H) 10/06/2016     Exercise Target Goals: Date: 12/11/16  Exercise Program Goal: Individual exercise prescription set with THRR, safety & activity barriers. Participant demonstrates ability to understand and report RPE using BORG scale, to self-measure pulse accurately, and to acknowledge the importance of the exercise prescription.  Exercise Prescription Goal: Starting with aerobic activity 30 plus minutes a day, 3 days per week for initial exercise prescription. Provide home exercise prescription and guidelines that participant acknowledges understanding prior to discharge.  Activity Barriers &  Risk Stratification:     Activity Barriers & Cardiac Risk Stratification - 12/11/16 1225      Activity Barriers & Cardiac Risk Stratification   Activity Barriers None   Cardiac Risk Stratification High      6 Minute Walk:     6 Minute Walk    Row Name 12/11/16 1203         6 Minute Walk   Phase Initial     Distance 1500 feet     Distance % Change 0 %     Walk Time 6 minutes     # of Rest Breaks 0     MPH 2.84     METS 3.17     RPE 13     Perceived Dyspnea  13     VO2 Peak 12.2     Symptoms No     Resting HR 63 bpm     Resting BP 142/76     Max Ex. HR 80 bpm     Max Ex. BP 150/84     2 Minute Post BP 140/74        Oxygen Initial Assessment:     Oxygen Initial Assessment - 12/11/16 1224      Home Oxygen   Home Oxygen Device None   Sleep Oxygen Prescription None   Home Exercise Oxygen Prescription None   Home at Rest Exercise Oxygen Prescription None     Initial 6 min Walk   Oxygen Used None     Program Oxygen Prescription   Program Oxygen Prescription None      Oxygen Re-Evaluation:   Oxygen Discharge (Final Oxygen Re-Evaluation):   Initial Exercise Prescription:     Initial Exercise Prescription - 12/11/16 1200      Date of Initial Exercise RX and Referring Provider   Date 12/11/16   Referring Provider Dr. Angelena Form     Treadmill   MPH 1.8   Grade 0   Minutes 15   METs 2.3     NuStep   Level 2   SPM 19   Minutes 20   METs 1.9     Prescription Details   Frequency (times per week) 3   Duration Progress to 30 minutes of continuous aerobic without signs/symptoms of physical distress     Intensity   THRR 40-80% of Max Heartrate [phone]   Ratings of Perceived Exertion 11-13   Perceived Dyspnea 0-4     Progression   Progression Continue progressive overload as per policy without signs/symptoms or physical distress.     Resistance Training   Training Prescription Yes   Weight 1  Reps 10-15      Perform Capillary Blood  Glucose checks as needed.  Exercise Prescription Changes:   Exercise Comments:   Exercise Goals and Review:      Exercise Goals    Row Name 12/11/16 1228             Exercise Goals   Increase Physical Activity Yes       Intervention Provide advice, education, support and counseling about physical activity/exercise needs.;Develop an individualized exercise prescription for aerobic and resistive training based on initial evaluation findings, risk stratification, comorbidities and participant's personal goals.       Expected Outcomes Achievement of increased cardiorespiratory fitness and enhanced flexibility, muscular endurance and strength shown through measurements of functional capacity and personal statement of participant.       Increase Strength and Stamina Yes       Intervention Provide advice, education, support and counseling about physical activity/exercise needs.;Develop an individualized exercise prescription for aerobic and resistive training based on initial evaluation findings, risk stratification, comorbidities and participant's personal goals.       Expected Outcomes Achievement of increased cardiorespiratory fitness and enhanced flexibility, muscular endurance and strength shown through measurements of functional capacity and personal statement of participant.          Exercise Goals Re-Evaluation :    Discharge Exercise Prescription (Final Exercise Prescription Changes):   Nutrition:  Target Goals: Understanding of nutrition guidelines, daily intake of sodium 1500mg , cholesterol 200mg , calories 30% from fat and 7% or less from saturated fats, daily to have 5 or more servings of fruits and vegetables.  Biometrics:     Pre Biometrics - 12/11/16 1205      Pre Biometrics   Height 5\' 9"  (1.753 m)   Weight 178 lb 12.7 oz (81.1 kg)   Waist Circumference 37 inches   Hip Circumference 38 inches   Waist to Hip Ratio 0.97 %   BMI (Calculated) 26.5   Triceps  Skinfold 11 mm   % Body Fat 24.2 %   Grip Strength 67.67 kg   Flexibility 0 in   Single Leg Stand 5 seconds       Nutrition Therapy Plan and Nutrition Goals:   Nutrition Discharge: Rate Your Plate Scores:     Nutrition Assessments - 12/11/16 1230      MEDFICTS Scores   Pre Score 9      Nutrition Goals Re-Evaluation:   Nutrition Goals Discharge (Final Nutrition Goals Re-Evaluation):   Psychosocial: Target Goals: Acknowledge presence or absence of significant depression and/or stress, maximize coping skills, provide positive support system. Participant is able to verbalize types and ability to use techniques and skills needed for reducing stress and depression.  Initial Review & Psychosocial Screening:     Initial Psych Review & Screening - 12/11/16 1233      Initial Review   Current issues with None Identified     Family Dynamics   Good Support System? Yes     Barriers   Psychosocial barriers to participate in program There are no identifiable barriers or psychosocial needs.     Screening Interventions   Interventions Encouraged to exercise      Quality of Life Scores:     Quality of Life - 12/11/16 1205      Quality of Life Scores   Health/Function Pre 24.8 %   Socioeconomic Pre 23.25 %   Psych/Spiritual Pre 24.86 %   Family Pre 26.4 %   GLOBAL Pre 24.69 %  PHQ-9: Recent Review Flowsheet Data    Depression screen [facility] 2/9 12/11/2016   Decreased Interest 0   PHQ - 2 Score 0   Altered sleeping 0   Tired, decreased energy 0   Change in appetite 0   Feeling bad or failure about yourself  0   Trouble concentrating 0   Moving slowly or fidgety/restless 0   Suicidal thoughts 0   PHQ-9 Score 0   Difficult doing work/chores Somewhat difficult     Interpretation of Total Score  Total Score Depression Severity:  1-4 = Minimal depression, 5-9 = Mild depression, 10-14 = Moderate depression, 15-19 = Moderately severe depression, 20-27 = Severe  depression   Psychosocial Evaluation and Intervention:     Psychosocial Evaluation - 12/11/16 1233      Psychosocial Evaluation & Interventions   Interventions Encouraged to exercise with the program and follow exercise prescription   Continue Psychosocial Services  No Follow up required      Psychosocial Re-Evaluation:   Psychosocial Discharge (Final Psychosocial Re-Evaluation):   Vocational Rehabilitation: Provide vocational rehab assistance to qualifying candidates.   Vocational Rehab Evaluation & Intervention:     Vocational Rehab - 12/11/16 1221      Initial Vocational Rehab Evaluation & Intervention   Assessment shows need for Vocational Rehabilitation No  Self employed      Education: Education Goals: Education classes will be provided on a weekly basis, covering required topics. Participant will state understanding/return demonstration of topics presented.  Learning Barriers/Preferences:     Learning Barriers/Preferences - 12/11/16 1220      Learning Barriers/Preferences   Learning Barriers None   Learning Preferences Skilled Demonstration;Individual Instruction;Group Instruction      Education Topics: Hypertension, Hypertension Reduction -Define heart disease and high blood pressure. Discus how high blood pressure affects the body and ways to reduce high blood pressure.   Exercise and Your Heart -Discuss why it is important to exercise, the FITT principles of exercise, normal and abnormal responses to exercise, and how to exercise safely.   Angina -Discuss definition of angina, causes of angina, treatment of angina, and how to decrease risk of having angina.   Cardiac Medications -Review what the following cardiac medications are used for, how they affect the body, and side effects that may occur when taking the medications.  Medications include Aspirin, Beta blockers, calcium channel blockers, ACE Inhibitors, angiotensin receptor blockers,  diuretics, digoxin, and antihyperlipidemics.   Congestive Heart Failure -Discuss the definition of CHF, how to live with CHF, the signs and symptoms of CHF, and how keep track of weight and sodium intake.   Heart Disease and Intimacy -Discus the effect sexual activity has on the heart, how changes occur during intimacy as we age, and safety during sexual activity.   Smoking Cessation / COPD -Discuss different methods to quit smoking, the health benefits of quitting smoking, and the definition of COPD.   Nutrition I: Fats -Discuss the types of cholesterol, what cholesterol does to the heart, and how cholesterol levels can be controlled.   Nutrition II: Labels -Discuss the different components of food labels and how to read food label   Heart Parts and Heart Disease -Discuss the anatomy of the heart, the pathway of blood circulation through the heart, and these are affected by heart disease.   Stress I: Signs and Symptoms -Discuss the causes of stress, how stress may lead to anxiety and depression, and ways to limit stress.   Stress II: Relaxation -Discuss different types  of relaxation techniques to limit stress.   Warning Signs of Stroke / TIA -Discuss definition of a stroke, what the signs and symptoms are of a stroke, and how to identify when someone is having stroke.   Knowledge Questionnaire Score:     Knowledge Questionnaire Score - 12/11/16 1221      Knowledge Questionnaire Score   Pre Score 17/24      Core Components/Risk Factors/Patient Goals at Admission:     Personal Goals and Risk Factors at Admission - 12/11/16 1231      Core Components/Risk Factors/Patient Goals on Admission    Weight Management Weight Maintenance   Tobacco Cessation --  Quit 1980   Personal Goal Other Yes   Personal Goal Get stronger and to get back to normal ADL's   Intervention Attend CR 3 x week and supplement home exercise 2 x week.   Expected Outcomes Achieve personal  goals      Core Components/Risk Factors/Patient Goals Review:      Goals and Risk Factor Review    Row Name 12/11/16 1232             Core Components/Risk Factors/Patient Goals Review   Personal Goals Review Weight Management/Obesity  Get stronger and get back to normal ADL's          Core Components/Risk Factors/Patient Goals at Discharge (Final Review):      Goals and Risk Factor Review - 12/11/16 1232      Core Components/Risk Factors/Patient Goals Review   Personal Goals Review Weight Management/Obesity  Get stronger and get back to normal ADL's      ITP Comments:   Comments: Patient arrived for 1st visit/orientation/education at 10:00. Patient was referred to CR by Dr. Angelena Form due to CABGx4 (Z95.1) and STEMI (I21.9). During orientation advised patient on arrival and appointment times what to wear, what to do before, during and after exercise. Reviewed attendance and class policy. Talked about inclement weather and class consultation policy. Pt is scheduled to return Cardiac Rehab on 12/15/16. Pt was advised to come to class 15 minutes before class starts. Patient was also given instructions on meeting with the dietician and attending the Family Structure classes. Pt is eager to get started. Patient participated in warm up stretches followed by light weights and resistance bands. Patient was then able to complete 6 minute walk test. Patient was measured for the equipment. Discussed equipment safety with patient. Took patient pre-anthropometric measurements. Patient finished visit at 12:00.

## 2016-12-11 NOTE — Progress Notes (Signed)
Cardiac Individual Treatment Plan  Patient Details  Name: Scott Jacobson MRN: [id number] Date of Birth: January 26, 1951 Referring Provider:   Flowsheet Row CARDIAC REHAB PHASE II ORIENTATION from 12/11/2016 in [place]  Referring Provider  Dr. Angelena Form      Initial Encounter Date:  Flowsheet Row CARDIAC REHAB PHASE II ORIENTATION from 12/11/2016 in [place]  Date  12/11/16  Referring Provider  Dr. Angelena Form      Visit Diagnosis: S/P CABG x 4  ST elevation myocardial infarction (STEMI) involving other coronary artery of inferior wall ([place])  Patient's Home Medications on Admission:  Current Outpatient Prescriptions:  .  aspirin EC 81 MG tablet, Take 1 tablet (81 mg total) by mouth daily., Disp: , Rfl:  .  atorvastatin (LIPITOR) 80 MG tablet, Take 1 tablet (80 mg total) by mouth daily at 6 PM., Disp: 30 tablet, Rfl: 11 .  cetirizine (ZYRTEC) 10 MG tablet, Take 10 mg by mouth daily at 12 noon. , Disp: , Rfl:  .  clopidogrel (PLAVIX) 75 MG tablet, Take 1 tablet (75 mg total) by mouth daily., Disp: 30 tablet, Rfl: 1 .  lisinopril (PRINIVIL,ZESTRIL) 2.5 MG tablet, Take 1 tablet (2.5 mg total) by mouth daily., Disp: 30 tablet, Rfl: 1 .  metoprolol tartrate (LOPRESSOR) 25 MG tablet, Take 1 tablet (25 mg total) by mouth 2 (two) times daily., Disp: 60 tablet, Rfl: 11 .  Multiple Vitamins-Minerals (ICAPS MV PO), Take 1 tablet by mouth 2 (two) times daily. , Disp: , Rfl:  .  pantoprazole (PROTONIX) 40 MG tablet, Take 40 mg by mouth daily., Disp: , Rfl:  .  Polyvinyl Alcohol-Povidone (REFRESH OP), Apply 1 drop to eye 4 (four) times daily as needed (dry eyes)., Disp: , Rfl:  .  tamsulosin (FLOMAX) 0.4 MG CAPS capsule, Take 0.4 mg by mouth daily., Disp: , Rfl:  .  traMADol (ULTRAM) 50 MG tablet, Take 1 tablet (50 mg total) by mouth every 4 (four) hours as needed for moderate pain., Disp: 30 tablet, Rfl: 0  Past Medical History: Past Medical History:   Diagnosis Date  . Anginal pain ([place])   . Arthritis    "probably"  . BPH (benign prostatic hyperplasia)   . Coronary artery disease   . Dyspnea   . GERD (gastroesophageal reflux disease)   . History of kidney stones   . Hypertension   . Kidney stones   . Macular degeneration   . Myocardial infarction   . Seasonal allergies     Tobacco Use: History  Smoking Status  . Former Smoker  . Packs/day: 3.00  . Years: 16.00  . Types: Cigarettes  . Quit date: 02/04/1979  Smokeless Tobacco  . Never Used    Labs: Recent Review Flowsheet Data    Labs for [facility]P Cardiac and Pulmonary Rehab Latest Ref Rng & Units 10/03/2016 10/03/2016 10/03/2016 10/03/2016 10/04/2016   Cholestrol 0 - 200 mg/dL - - - - -   LDLCALC 0 - 99 mg/dL - - - - -   HDL >40 mg/dL - - - - -   Trlycerides <150 mg/dL - - - - -   Hemoglobin A1c 4.8 - 5.6 % - - - - -   PHART 7.350 - 7.450 7.308(L) - 7.349(L) 7.390 -   PCO2ART 32.0 - 48.0 mmHg 43.1 - 39.3 36.0 -   HCO3 20.0 - 28.0 mmol/L 21.6 - 21.6 21.7 -   TCO2 0 - 100 mmol/L 23 23 23 23  23  ACIDBASEDEF 0.0 - 2.0 mmol/L 4.0(H) - 4.0(H) 3.0(H) -   O2SAT % 98.0 - 99.0 99.0 -      Capillary Blood Glucose: Lab Results  Component Value Date   GLUCAP 108 (H) 10/08/2016   GLUCAP 116 (H) 10/07/2016   GLUCAP 113 (H) 10/07/2016   GLUCAP 113 (H) 10/07/2016   GLUCAP 134 (H) 10/06/2016     Exercise Target Goals: Date: 12/11/16  Exercise Program Goal: Individual exercise prescription set with THRR, safety & activity barriers. Participant demonstrates ability to understand and report RPE using BORG scale, to self-measure pulse accurately, and to acknowledge the importance of the exercise prescription.  Exercise Prescription Goal: Starting with aerobic activity 30 plus minutes a day, 3 days per week for initial exercise prescription. Provide home exercise prescription and guidelines that participant acknowledges understanding prior to discharge.  Activity Barriers &  Risk Stratification:     Activity Barriers & Cardiac Risk Stratification - 12/11/16 1225      Activity Barriers & Cardiac Risk Stratification   Activity Barriers None   Cardiac Risk Stratification High      6 Minute Walk:     6 Minute Walk    Row Name 12/11/16 1203         6 Minute Walk   Phase Initial     Distance 1500 feet     Distance % Change 0 %     Walk Time 6 minutes     # of Rest Breaks 0     MPH 2.84     METS 3.17     RPE 13     Perceived Dyspnea  13     VO2 Peak 12.2     Symptoms No     Resting HR 63 bpm     Resting BP 142/76     Max Ex. HR 80 bpm     Max Ex. BP 150/84     2 Minute Post BP 140/74        Oxygen Initial Assessment:     Oxygen Initial Assessment - 12/11/16 1224      Home Oxygen   Home Oxygen Device None   Sleep Oxygen Prescription None   Home Exercise Oxygen Prescription None   Home at Rest Exercise Oxygen Prescription None     Initial 6 min Walk   Oxygen Used None     Program Oxygen Prescription   Program Oxygen Prescription None      Oxygen Re-Evaluation:   Oxygen Discharge (Final Oxygen Re-Evaluation):   Initial Exercise Prescription:     Initial Exercise Prescription - 12/11/16 1200      Date of Initial Exercise RX and Referring Provider   Date 12/11/16   Referring Provider Dr. Angelena Form     Treadmill   MPH 1.8   Grade 0   Minutes 15   METs 2.3     NuStep   Level 2   SPM 19   Minutes 20   METs 1.9     Prescription Details   Frequency (times per week) 3   Duration Progress to 30 minutes of continuous aerobic without signs/symptoms of physical distress     Intensity   THRR 40-80% of Max Heartrate [phone]   Ratings of Perceived Exertion 11-13   Perceived Dyspnea 0-4     Progression   Progression Continue progressive overload as per policy without signs/symptoms or physical distress.     Resistance Training   Training Prescription Yes   Weight 1  Reps 10-15      Perform Capillary Blood  Glucose checks as needed.  Exercise Prescription Changes:   Exercise Comments:   Exercise Goals and Review:      Exercise Goals    Row Name 12/11/16 1228             Exercise Goals   Increase Physical Activity Yes       Intervention Provide advice, education, support and counseling about physical activity/exercise needs.;Develop an individualized exercise prescription for aerobic and resistive training based on initial evaluation findings, risk stratification, comorbidities and participant's personal goals.       Expected Outcomes Achievement of increased cardiorespiratory fitness and enhanced flexibility, muscular endurance and strength shown through measurements of functional capacity and personal statement of participant.       Increase Strength and Stamina Yes       Intervention Provide advice, education, support and counseling about physical activity/exercise needs.;Develop an individualized exercise prescription for aerobic and resistive training based on initial evaluation findings, risk stratification, comorbidities and participant's personal goals.       Expected Outcomes Achievement of increased cardiorespiratory fitness and enhanced flexibility, muscular endurance and strength shown through measurements of functional capacity and personal statement of participant.          Exercise Goals Re-Evaluation :    Discharge Exercise Prescription (Final Exercise Prescription Changes):   Nutrition:  Target Goals: Understanding of nutrition guidelines, daily intake of sodium 1500mg , cholesterol 200mg , calories 30% from fat and 7% or less from saturated fats, daily to have 5 or more servings of fruits and vegetables.  Biometrics:     Pre Biometrics - 12/11/16 1205      Pre Biometrics   Height 5\' 9"  (1.753 m)   Weight 178 lb 12.7 oz (81.1 kg)   Waist Circumference 37 inches   Hip Circumference 38 inches   Waist to Hip Ratio 0.97 %   BMI (Calculated) 26.5   Triceps  Skinfold 11 mm   % Body Fat 24.2 %   Grip Strength 67.67 kg   Flexibility 0 in   Single Leg Stand 5 seconds       Nutrition Therapy Plan and Nutrition Goals:   Nutrition Discharge: Rate Your Plate Scores:     Nutrition Assessments - 12/11/16 1230      MEDFICTS Scores   Pre Score 9      Nutrition Goals Re-Evaluation:   Nutrition Goals Discharge (Final Nutrition Goals Re-Evaluation):   Psychosocial: Target Goals: Acknowledge presence or absence of significant depression and/or stress, maximize coping skills, provide positive support system. Participant is able to verbalize types and ability to use techniques and skills needed for reducing stress and depression.  Initial Review & Psychosocial Screening:     Initial Psych Review & Screening - 12/11/16 1233      Initial Review   Current issues with None Identified     Family Dynamics   Good Support System? Yes     Barriers   Psychosocial barriers to participate in program There are no identifiable barriers or psychosocial needs.     Screening Interventions   Interventions Encouraged to exercise      Quality of Life Scores:     Quality of Life - 12/11/16 1205      Quality of Life Scores   Health/Function Pre 24.8 %   Socioeconomic Pre 23.25 %   Psych/Spiritual Pre 24.86 %   Family Pre 26.4 %   GLOBAL Pre 24.69 %  PHQ-9: Recent Review Flowsheet Data    Depression screen [facility] 2/9 12/11/2016   Decreased Interest 0   PHQ - 2 Score 0   Altered sleeping 0   Tired, decreased energy 0   Change in appetite 0   Feeling bad or failure about yourself  0   Trouble concentrating 0   Moving slowly or fidgety/restless 0   Suicidal thoughts 0   PHQ-9 Score 0   Difficult doing work/chores Somewhat difficult     Interpretation of Total Score  Total Score Depression Severity:  1-4 = Minimal depression, 5-9 = Mild depression, 10-14 = Moderate depression, 15-19 = Moderately severe depression, 20-27 = Severe  depression   Psychosocial Evaluation and Intervention:     Psychosocial Evaluation - 12/11/16 1233      Psychosocial Evaluation & Interventions   Interventions Encouraged to exercise with the program and follow exercise prescription   Continue Psychosocial Services  No Follow up required      Psychosocial Re-Evaluation:   Psychosocial Discharge (Final Psychosocial Re-Evaluation):   Vocational Rehabilitation: Provide vocational rehab assistance to qualifying candidates.   Vocational Rehab Evaluation & Intervention:     Vocational Rehab - 12/11/16 1221      Initial Vocational Rehab Evaluation & Intervention   Assessment shows need for Vocational Rehabilitation No  Self employed      Education: Education Goals: Education classes will be provided on a weekly basis, covering required topics. Participant will state understanding/return demonstration of topics presented.  Learning Barriers/Preferences:     Learning Barriers/Preferences - 12/11/16 1220      Learning Barriers/Preferences   Learning Barriers None   Learning Preferences Skilled Demonstration;Individual Instruction;Group Instruction      Education Topics: Hypertension, Hypertension Reduction -Define heart disease and high blood pressure. Discus how high blood pressure affects the body and ways to reduce high blood pressure.   Exercise and Your Heart -Discuss why it is important to exercise, the FITT principles of exercise, normal and abnormal responses to exercise, and how to exercise safely.   Angina -Discuss definition of angina, causes of angina, treatment of angina, and how to decrease risk of having angina.   Cardiac Medications -Review what the following cardiac medications are used for, how they affect the body, and side effects that may occur when taking the medications.  Medications include Aspirin, Beta blockers, calcium channel blockers, ACE Inhibitors, angiotensin receptor blockers,  diuretics, digoxin, and antihyperlipidemics.   Congestive Heart Failure -Discuss the definition of CHF, how to live with CHF, the signs and symptoms of CHF, and how keep track of weight and sodium intake.   Heart Disease and Intimacy -Discus the effect sexual activity has on the heart, how changes occur during intimacy as we age, and safety during sexual activity.   Smoking Cessation / COPD -Discuss different methods to quit smoking, the health benefits of quitting smoking, and the definition of COPD.   Nutrition I: Fats -Discuss the types of cholesterol, what cholesterol does to the heart, and how cholesterol levels can be controlled.   Nutrition II: Labels -Discuss the different components of food labels and how to read food label   Heart Parts and Heart Disease -Discuss the anatomy of the heart, the pathway of blood circulation through the heart, and these are affected by heart disease.   Stress I: Signs and Symptoms -Discuss the causes of stress, how stress may lead to anxiety and depression, and ways to limit stress.   Stress II: Relaxation -Discuss different types  of relaxation techniques to limit stress.   Warning Signs of Stroke / TIA -Discuss definition of a stroke, what the signs and symptoms are of a stroke, and how to identify when someone is having stroke.   Knowledge Questionnaire Score:     Knowledge Questionnaire Score - 12/11/16 1221      Knowledge Questionnaire Score   Pre Score 17/24      Core Components/Risk Factors/Patient Goals at Admission:     Personal Goals and Risk Factors at Admission - 12/11/16 1231      Core Components/Risk Factors/Patient Goals on Admission    Weight Management Weight Maintenance   Tobacco Cessation --  Quit 1980   Personal Goal Other Yes   Personal Goal Get stronger and to get back to normal ADL's   Intervention Attend CR 3 x week and supplement home exercise 2 x week.   Expected Outcomes Achieve personal  goals      Core Components/Risk Factors/Patient Goals Review:      Goals and Risk Factor Review    Row Name 12/11/16 1232             Core Components/Risk Factors/Patient Goals Review   Personal Goals Review Weight Management/Obesity  Get stronger and get back to normal ADL's          Core Components/Risk Factors/Patient Goals at Discharge (Final Review):      Goals and Risk Factor Review - 12/11/16 1232      Core Components/Risk Factors/Patient Goals Review   Personal Goals Review Weight Management/Obesity  Get stronger and get back to normal ADL's      ITP Comments:   Comments: Patient arrived for 1st visit/orientation/education at 10:00. Patient was referred to CR by Dr. Angelena Form due to CABGx4 (Z95.1) and STEMI (I21.9). During orientation advised patient on arrival and appointment times what to wear, what to do before, during and after exercise. Reviewed attendance and class policy. Talked about inclement weather and class consultation policy. Pt is scheduled to return Cardiac Rehab on 12/15/16. Pt was advised to come to class 15 minutes before class starts. Patient was also given instructions on meeting with the dietician and attending the Family Structure classes. Pt is eager to get started. Patient participated in warm up stretches followed by light weights and resistance bands. Patient was then able to complete 6 minute walk test. Patient was measured for the equipment. Discussed equipment safety with patient. Took patient pre-anthropometric measurements. Patient finished visit at 12:00.

## 2016-12-15 ENCOUNTER — Encounter (HOSPITAL_COMMUNITY)
Admission: RE | Admit: 2016-12-15 | Discharge: 2016-12-15 | Disposition: A | Discharge status: Home / Self Care | Payer: Medicare Other | Source: Ambulatory Visit | Attending: Cardiovascular Disease | Admitting: Cardiovascular Disease

## 2016-12-15 DIAGNOSIS — I2510 Atherosclerotic heart disease of native coronary artery without angina pectoris: Secondary | ICD-10-CM | POA: Diagnosis not present

## 2016-12-15 DIAGNOSIS — Z951 Presence of aortocoronary bypass graft: Secondary | ICD-10-CM

## 2016-12-15 NOTE — Progress Notes (Signed)
Daily Session Note  Patient Details  Name: Scott Jacobson MRN: [id number] Date of Birth: 10/13/1950 Referring Provider:   Flowsheet Row CARDIAC REHAB PHASE II ORIENTATION from 12/11/2016 in [facility] CARDIAC REHABILITATION  Referring Provider  Dr. Mcalhany      Encounter Date: 12/15/2016  Check In:     Session Check In - 12/15/16 0815      Check-In   Location [facility]   Staff Present Diane Coad, MS, EP, [facility], Exercise Physiologist;Jahleel Stroschein, BS, EP, Exercise Physiologist   Supervising physician immediately available to respond to emergencies See telemetry face sheet for immediately available MD   Medication changes reported     No   Fall or balance concerns reported    No   Warm-up and Cool-down Performed as group-led instruction   Resistance Training Performed Yes   VAD Patient? No     Pain Assessment   Currently in Pain? No/denies   Pain Score 0-No pain   Multiple Pain Sites No      Capillary Blood Glucose: No results found for this or any previous visit (from the past 24 hour(s)).    History  Smoking Status  . Former Smoker  . Packs/day: 3.00  . Years: 16.00  . Types: Cigarettes  . Quit date: 02/04/1979  Smokeless Tobacco  . Never Used    Goals Met:  Independence with exercise equipment Exercise tolerated well No report of cardiac concerns or symptoms Strength training completed today  Goals Unmet:  Not Applicable  Comments: Check out 915   Dr. Suresh Koneswaran is Medical Director for  Cardiac and Pulmonary Rehab. 

## 2016-12-17 ENCOUNTER — Encounter (HOSPITAL_COMMUNITY)
Admission: RE | Admit: 2016-12-17 | Discharge: 2016-12-17 | Disposition: A | Discharge status: Home / Self Care | Payer: Medicare Other | Source: Ambulatory Visit | Attending: Cardiovascular Disease | Admitting: Cardiovascular Disease

## 2016-12-17 DIAGNOSIS — Z951 Presence of aortocoronary bypass graft: Secondary | ICD-10-CM | POA: Diagnosis not present

## 2016-12-17 DIAGNOSIS — I2510 Atherosclerotic heart disease of native coronary artery without angina pectoris: Secondary | ICD-10-CM | POA: Diagnosis not present

## 2016-12-17 NOTE — Progress Notes (Signed)
Daily Session Note  Patient Details  Name: Scott Jacobson MRN: [id number] Date of Birth: Sep 27, 1951 Referring Provider:   Flowsheet Row CARDIAC REHAB PHASE II ORIENTATION from 12/11/2016 in [place]  Referring Provider  Dr. Angelena Form      Encounter Date: 12/17/2016  Check In:     Session Check In - 12/17/16 0815      Check-In   Location [facility]   Staff Present Aundra Dubin, RN, BSN;Vitaly Wanat Luther Parody, BS, EP, Exercise Physiologist   Supervising physician immediately available to respond to emergencies See telemetry face sheet for immediately available MD   Medication changes reported     No   Fall or balance concerns reported    No   Warm-up and Cool-down Performed as group-led instruction   Resistance Training Performed Yes   VAD Patient? No     Pain Assessment   Currently in Pain? No/denies   Pain Score 0-No pain   Multiple Pain Sites No      Capillary Blood Glucose: No results found for this or any previous visit (from the past 24 hour(s)).    History  Smoking Status  . Former Smoker  . Packs/day: 3.00  . Years: 16.00  . Types: Cigarettes  . Quit date: 02/04/1979  Smokeless Tobacco  . Never Used    Goals Met:  Independence with exercise equipment Exercise tolerated well No report of cardiac concerns or symptoms Strength training completed today  Goals Unmet:  Not Applicable  Comments: Check out 915   Dr. Kate Sable is Medical Director for [place] and Pulmonary Rehab.

## 2016-12-19 ENCOUNTER — Encounter (HOSPITAL_COMMUNITY)
Admission: RE | Admit: 2016-12-19 | Discharge: 2016-12-19 | Disposition: A | Discharge status: Home / Self Care | Payer: Medicare Other | Source: Ambulatory Visit | Attending: Cardiovascular Disease | Admitting: Cardiovascular Disease

## 2016-12-19 DIAGNOSIS — I2510 Atherosclerotic heart disease of native coronary artery without angina pectoris: Secondary | ICD-10-CM | POA: Diagnosis not present

## 2016-12-19 DIAGNOSIS — Z951 Presence of aortocoronary bypass graft: Secondary | ICD-10-CM | POA: Diagnosis not present

## 2016-12-22 ENCOUNTER — Encounter (HOSPITAL_COMMUNITY)
Admission: RE | Admit: 2016-12-22 | Discharge: 2016-12-22 | Disposition: A | Discharge status: Home / Self Care | Payer: Medicare Other | Source: Ambulatory Visit | Attending: Cardiovascular Disease | Admitting: Cardiovascular Disease

## 2016-12-22 DIAGNOSIS — Z951 Presence of aortocoronary bypass graft: Secondary | ICD-10-CM | POA: Diagnosis not present

## 2016-12-22 DIAGNOSIS — I2510 Atherosclerotic heart disease of native coronary artery without angina pectoris: Secondary | ICD-10-CM | POA: Diagnosis not present

## 2016-12-22 NOTE — Progress Notes (Signed)
Daily Session Note  Patient Details  Name: Scott Jacobson MRN: [id number] Date of Birth: 13-Jan-1951 Referring Provider:     [place] from 12/11/2016 in [place]  Referring Provider  Dr. Angelena Form      Encounter Date: 12/22/2016  Check In:     Session Check In - 12/22/16 0815      Check-In   Location [facility]   Staff Present Russella Dar, MS, EP, [facility], Exercise Physiologist;Gregory Luther Parody, BS, EP, Exercise Physiologist   Supervising physician immediately available to respond to emergencies See telemetry face sheet for immediately available MD   Medication changes reported     No   Fall or balance concerns reported    No   Warm-up and Cool-down Performed as group-led instruction   Resistance Training Performed Yes   VAD Patient? No     Pain Assessment   Currently in Pain? No/denies   Multiple Pain Sites No      Capillary Blood Glucose: No results found for this or any previous visit (from the past 24 hour(s)).    History  Smoking Status  . Former Smoker  . Packs/day: 3.00  . Years: 16.00  . Types: Cigarettes  . Quit date: 02/04/1979  Smokeless Tobacco  . Never Used    Goals Met:  Independence with exercise equipment Exercise tolerated well No report of cardiac concerns or symptoms Strength training completed today  Goals Unmet:  Not Applicable  Comments: Check out 9:15   Dr. Kate Sable is Medical Director for [place] and Pulmonary Rehab.

## 2016-12-24 ENCOUNTER — Encounter (HOSPITAL_COMMUNITY)
Admission: RE | Admit: 2016-12-24 | Discharge: 2016-12-24 | Disposition: A | Discharge status: Home / Self Care | Payer: Medicare Other | Source: Ambulatory Visit | Attending: Cardiovascular Disease | Admitting: Cardiovascular Disease

## 2016-12-24 DIAGNOSIS — I2510 Atherosclerotic heart disease of native coronary artery without angina pectoris: Secondary | ICD-10-CM | POA: Diagnosis not present

## 2016-12-24 DIAGNOSIS — Z951 Presence of aortocoronary bypass graft: Secondary | ICD-10-CM

## 2016-12-24 NOTE — Progress Notes (Signed)
Daily Session Note  Patient Details  Name: Scott Jacobson MRN: [id number] Date of Birth: 1951/09/14 Referring Provider:     CARDIAC REHAB PHASE II ORIENTATION from 12/11/2016 in [place]  Referring Provider  Dr. Angelena Form      Encounter Date: 12/24/2016  Check In:     Session Check In - 12/24/16 0815      Check-In   Location [facility]   Staff Present Aundra Dubin, RN, BSN;Lukasz Rogus Luther Parody, BS, EP, Exercise Physiologist   Supervising physician immediately available to respond to emergencies See telemetry face sheet for immediately available MD   Medication changes reported     No   Fall or balance concerns reported    No   Warm-up and Cool-down Performed as group-led instruction   Resistance Training Performed Yes   VAD Patient? No     Pain Assessment   Currently in Pain? No/denies   Pain Score 0-No pain   Multiple Pain Sites No      Capillary Blood Glucose: No results found for this or any previous visit (from the past 24 hour(s)).    History  Smoking Status  . Former Smoker  . Packs/day: 3.00  . Years: 16.00  . Types: Cigarettes  . Quit date: 02/04/1979  Smokeless Tobacco  . Never Used    Goals Met:  Independence with exercise equipment Exercise tolerated well No report of cardiac concerns or symptoms Strength training completed today  Goals Unmet:  Not Applicable  Comments: Check out 915   Dr. Kate Sable is Medical Director for [place] and Pulmonary Rehab.

## 2016-12-26 ENCOUNTER — Encounter (HOSPITAL_COMMUNITY)
Admission: RE | Admit: 2016-12-26 | Discharge: 2016-12-26 | Disposition: A | Discharge status: Home / Self Care | Payer: Medicare Other | Source: Ambulatory Visit | Attending: Cardiovascular Disease | Admitting: Cardiovascular Disease

## 2016-12-26 DIAGNOSIS — I2510 Atherosclerotic heart disease of native coronary artery without angina pectoris: Secondary | ICD-10-CM | POA: Diagnosis not present

## 2016-12-26 DIAGNOSIS — Z951 Presence of aortocoronary bypass graft: Secondary | ICD-10-CM | POA: Diagnosis not present

## 2016-12-26 NOTE — Progress Notes (Signed)
Daily Session Note  Patient Details  Name: Scott Jacobson MRN: [id number] Date of Birth: [birth year] Referring Provider:     CARDIAC REHAB PHASE II ORIENTATION from 12/11/2016 in [place]  Referring Provider  Dr. Angelena Form      Encounter Date: 12/26/2016  Check In:     Session Check In - 12/26/16 0815      Check-In   Location [facility]   Staff Present Aundra Dubin, RN, BSN;Teriyah Purington Luther Parody, BS, EP, Exercise Physiologist   Supervising physician immediately available to respond to emergencies See telemetry face sheet for immediately available MD   Medication changes reported     No   Fall or balance concerns reported    No   Warm-up and Cool-down Performed as group-led instruction   Resistance Training Performed Yes   VAD Patient? No     Pain Assessment   Currently in Pain? No/denies   Pain Score 0-No pain   Multiple Pain Sites No      Capillary Blood Glucose: No results found for this or any previous visit (from the past 24 hour(s)).    History  Smoking Status  . Former Smoker  . Packs/day: 3.00  . Years: 16.00  . Types: Cigarettes  . Quit date: 02/04/1979  Smokeless Tobacco  . Never Used    Goals Met:  Independence with exercise equipment Exercise tolerated well No report of cardiac concerns or symptoms Strength training completed today  Goals Unmet:  Not Applicable  Comments: Check out 915   Dr. Kate Sable is Medical Director for [place] and Pulmonary Rehab.

## 2016-12-29 ENCOUNTER — Encounter (HOSPITAL_COMMUNITY)
Admission: RE | Admit: 2016-12-29 | Discharge: 2016-12-29 | Disposition: A | Discharge status: Home / Self Care | Payer: Medicare Other | Source: Ambulatory Visit | Attending: Cardiovascular Disease | Admitting: Cardiovascular Disease

## 2016-12-29 DIAGNOSIS — I2510 Atherosclerotic heart disease of native coronary artery without angina pectoris: Secondary | ICD-10-CM | POA: Diagnosis not present

## 2016-12-29 DIAGNOSIS — Z951 Presence of aortocoronary bypass graft: Secondary | ICD-10-CM

## 2016-12-29 NOTE — Progress Notes (Signed)
Daily Session Note  Patient Details  Name: Scott Jacobson MRN: [id number] Date of Birth: [birth year] Referring Provider:     [place] from 12/11/2016 in [place]  Referring Provider  Dr. Angelena Form      Encounter Date: 12/29/2016  Check In:     Session Check In - 12/29/16 0810      Check-In   Location [facility]   Staff Present Suzanne Boron, BS, EP, Exercise Physiologist;Debra Wynetta Emery, RN, BSN   Supervising physician immediately available to respond to emergencies See telemetry face sheet for immediately available MD   Medication changes reported     No   Fall or balance concerns reported    No   Warm-up and Cool-down Performed as group-led instruction   Resistance Training Performed Yes   VAD Patient? No     Pain Assessment   Currently in Pain? No/denies   Pain Score 0-No pain   Multiple Pain Sites No      Capillary Blood Glucose: No results found for this or any previous visit (from the past 24 hour(s)).    History  Smoking Status  . Former Smoker  . Packs/day: 3.00  . Years: 16.00  . Types: Cigarettes  . Quit date: 02/04/1979  Smokeless Tobacco  . Never Used    Goals Met:  Independence with exercise equipment Exercise tolerated well No report of cardiac concerns or symptoms Strength training completed today  Goals Unmet:  Not Applicable  Comments: Check out 915   Dr. Kate Sable is Medical Director for [place] and Pulmonary Rehab.

## 2016-12-31 ENCOUNTER — Encounter (HOSPITAL_COMMUNITY)
Admission: RE | Admit: 2016-12-31 | Discharge: 2016-12-31 | Disposition: A | Discharge status: Home / Self Care | Payer: Medicare Other | Source: Ambulatory Visit | Attending: Cardiovascular Disease | Admitting: Cardiovascular Disease

## 2016-12-31 DIAGNOSIS — Z951 Presence of aortocoronary bypass graft: Secondary | ICD-10-CM

## 2016-12-31 DIAGNOSIS — I2510 Atherosclerotic heart disease of native coronary artery without angina pectoris: Secondary | ICD-10-CM | POA: Diagnosis not present

## 2016-12-31 NOTE — Progress Notes (Signed)
Daily Session Note  Patient Details  Name: Scott Jacobson MRN: [id number] Date of Birth: 1951/01/03 Referring Provider:     CARDIAC REHAB PHASE II ORIENTATION from 12/11/2016 in [place]  Referring Provider  Dr. Angelena Form      Encounter Date: 12/31/2016  Check In:     Session Check In - 12/31/16 0815      Check-In   Location [facility]   Staff Present Aundra Dubin, RN, BSN;Carolie Mcilrath Luther Parody, BS, EP, Exercise Physiologist   Supervising physician immediately available to respond to emergencies See telemetry face sheet for immediately available MD   Medication changes reported     No   Fall or balance concerns reported    No   Warm-up and Cool-down Performed as group-led instruction   Resistance Training Performed Yes   VAD Patient? No     Pain Assessment   Currently in Pain? No/denies   Pain Score 0-No pain   Multiple Pain Sites No      Capillary Blood Glucose: No results found for this or any previous visit (from the past 24 hour(s)).    History  Smoking Status  . Former Smoker  . Packs/day: 3.00  . Years: 16.00  . Types: Cigarettes  . Quit date: 02/04/1979  Smokeless Tobacco  . Never Used    Goals Met:  Independence with exercise equipment Exercise tolerated well No report of cardiac concerns or symptoms Strength training completed today  Goals Unmet:  Not Applicable  Comments: Check out 915   Dr. Kate Sable is Medical Director for [place] and [facility].

## 2017-01-02 ENCOUNTER — Encounter (HOSPITAL_COMMUNITY)
Admission: RE | Admit: 2017-01-02 | Discharge: 2017-01-02 | Disposition: A | Discharge status: Home / Self Care | Payer: Medicare Other | Source: Ambulatory Visit | Attending: Cardiovascular Disease | Admitting: Cardiovascular Disease

## 2017-01-02 DIAGNOSIS — I2510 Atherosclerotic heart disease of native coronary artery without angina pectoris: Secondary | ICD-10-CM | POA: Diagnosis not present

## 2017-01-02 DIAGNOSIS — Z951 Presence of aortocoronary bypass graft: Secondary | ICD-10-CM

## 2017-01-02 NOTE — Progress Notes (Signed)
Daily Session Note  Patient Details  Name: Scott Jacobson MRN: [id number] Date of Birth: 29-May-1951 Referring Provider:     CARDIAC REHAB PHASE II ORIENTATION from 12/11/2016 in [place]  Referring Provider  Dr. Angelena Form      Encounter Date: 01/02/2017  Check In:     Session Check In - 01/02/17 0815      Check-In   Location [facility]   Staff Present Aundra Dubin, RN, BSN;Tramayne Sebesta Luther Parody, BS, EP, Exercise Physiologist   Supervising physician immediately available to respond to emergencies See telemetry face sheet for immediately available MD   Medication changes reported     No   Fall or balance concerns reported    No   Warm-up and Cool-down Performed as group-led instruction   Resistance Training Performed Yes   VAD Patient? No     Pain Assessment   Currently in Pain? No/denies   Pain Score 0-No pain   Multiple Pain Sites No      Capillary Blood Glucose: No results found for this or any previous visit (from the past 24 hour(s)).    History  Smoking Status  . Former Smoker  . Packs/day: 3.00  . Years: 16.00  . Types: Cigarettes  . Quit date: 02/04/1979  Smokeless Tobacco  . Never Used    Goals Met:  Independence with exercise equipment Exercise tolerated well No report of cardiac concerns or symptoms Strength training completed today  Goals Unmet:  Not Applicable  Comments: Check out 915   Dr. Kate Sable is Medical Director for [place] and [facility].

## 2017-01-05 ENCOUNTER — Encounter (HOSPITAL_COMMUNITY)
Admission: RE | Admit: 2017-01-05 | Discharge: 2017-01-05 | Disposition: A | Discharge status: Home / Self Care | Payer: Medicare Other | Source: Ambulatory Visit | Attending: Cardiovascular Disease | Admitting: Cardiovascular Disease

## 2017-01-05 DIAGNOSIS — Z951 Presence of aortocoronary bypass graft: Secondary | ICD-10-CM | POA: Diagnosis not present

## 2017-01-05 DIAGNOSIS — I2510 Atherosclerotic heart disease of native coronary artery without angina pectoris: Secondary | ICD-10-CM | POA: Diagnosis not present

## 2017-01-05 NOTE — Progress Notes (Signed)
Daily Session Note  Patient Details  Name: Scott Jacobson MRN: [id number] Date of Birth: 12-03-1950 Referring Provider:     CARDIAC REHAB PHASE II ORIENTATION from 12/11/2016 in [place]  Referring Provider  Dr. Angelena Form      Encounter Date: 01/05/2017  Check In:     Session Check In - 01/05/17 0815      Check-In   Location [facility]   Staff Present Aundra Dubin, RN, BSN;Adysen Raphael Luther Parody, BS, EP, Exercise Physiologist   Supervising physician immediately available to respond to emergencies See telemetry face sheet for immediately available MD   Medication changes reported     No   Fall or balance concerns reported    No   Warm-up and Cool-down Performed as group-led instruction   Resistance Training Performed Yes   VAD Patient? No     Pain Assessment   Currently in Pain? No/denies   Pain Score 0-No pain   Multiple Pain Sites No      Capillary Blood Glucose: No results found for this or any previous visit (from the past 24 hour(s)).    History  Smoking Status  . Former Smoker  . Packs/day: 3.00  . Years: 16.00  . Types: Cigarettes  . Quit date: 02/04/1979  Smokeless Tobacco  . Never Used    Goals Met:  Independence with exercise equipment Exercise tolerated well No report of cardiac concerns or symptoms Strength training completed today  Goals Unmet:  Not Applicable  Comments: Check out 915   Dr. Kate Sable is Medical Director for [place] and [facility].

## 2017-01-07 ENCOUNTER — Encounter (HOSPITAL_COMMUNITY)
Admission: RE | Admit: 2017-01-07 | Discharge: 2017-01-07 | Disposition: A | Discharge status: Home / Self Care | Payer: Medicare Other | Source: Ambulatory Visit | Attending: Cardiovascular Disease | Admitting: Cardiovascular Disease

## 2017-01-07 DIAGNOSIS — I2510 Atherosclerotic heart disease of native coronary artery without angina pectoris: Secondary | ICD-10-CM | POA: Diagnosis not present

## 2017-01-07 DIAGNOSIS — I2119 ST elevation (STEMI) myocardial infarction involving other coronary artery of inferior wall: Secondary | ICD-10-CM

## 2017-01-07 DIAGNOSIS — Z951 Presence of aortocoronary bypass graft: Secondary | ICD-10-CM | POA: Diagnosis not present

## 2017-01-07 NOTE — Progress Notes (Signed)
Daily Session Note  Patient Details  Name: Scott Jacobson MRN: [id number] Date of Birth: 04-23-1951 Referring Provider:     CARDIAC REHAB PHASE II ORIENTATION from 12/11/2016 in [place]  Referring Provider  Dr. Angelena Form      Encounter Date: 01/07/2017  Check In:     Session Check In - 01/07/17 0815      Check-In   Location [facility]   Staff Present Aundra Dubin, RN, BSN;Delainy Mcelhiney Luther Parody, BS, EP, Exercise Physiologist   Supervising physician immediately available to respond to emergencies See telemetry face sheet for immediately available MD   Medication changes reported     No   Fall or balance concerns reported    No   Warm-up and Cool-down Performed as group-led instruction   Resistance Training Performed Yes   VAD Patient? No     Pain Assessment   Currently in Pain? No/denies   Pain Score 0-No pain   Multiple Pain Sites No      Capillary Blood Glucose: No results found for this or any previous visit (from the past 24 hour(s)).      Exercise Prescription Changes - 01/06/17 1200      Response to Exercise   Blood Pressure (Admit) 134/72   Blood Pressure (Exercise) 146/70   Blood Pressure (Exit) 128/70   Heart Rate (Admit) 65 bpm   Heart Rate (Exercise) 94 bpm   Heart Rate (Exit) 96 bpm   Rating of Perceived Exertion (Exercise) 10   Duration Progress to 30 minutes of  aerobic without signs/symptoms of physical distress   Intensity THRR unchanged     Progression   Progression Continue to progress workloads to maintain intensity without signs/symptoms of physical distress.     Resistance Training   Training Prescription Yes   Weight 4   Reps 10-15     Treadmill   MPH 2.6   Grade 0   Minutes 15   METs 2.98     NuStep   Level 3   SPM 35   Minutes 20   METs 3.57     Home Exercise Plan   Plans to continue exercise at Home (comment)   Frequency Add 2 additional days to program exercise sessions.      History   Smoking Status  . Former Smoker  . Packs/day: 3.00  . Years: 16.00  . Types: Cigarettes  . Quit date: 02/04/1979  Smokeless Tobacco  . Never Used    Goals Met:  Independence with exercise equipment Exercise tolerated well No report of cardiac concerns or symptoms Strength training completed today  Goals Unmet:  Not Applicable  Comments: Check out 915   Dr. Kate Sable is Medical Director for [place] and [facility] Re[facility].

## 2017-01-07 NOTE — Progress Notes (Signed)
Cardiac Individual Treatment Plan  Patient Details  Name: Scott Jacobson MRN: [id number] Date of Birth: 10-27-1950 Referring Provider:     CARDIAC REHAB PHASE II ORIENTATION from 12/11/2016 in [place]  Referring Provider  Dr. Angelena Form      Initial Encounter Date:    CARDIAC REHAB PHASE II ORIENTATION from 12/11/2016 in [place]  Date  12/11/16  Referring Provider  Dr. Angelena Form      Visit Diagnosis: S/P CABG x 4  ST elevation myocardial infarction (STEMI) involving other coronary artery of inferior wall ([place])  Patient's Home Medications on Admission:  Current Outpatient Prescriptions:  .  aspirin EC 81 MG tablet, Take 1 tablet (81 mg total) by mouth daily., Disp: , Rfl:  .  atorvastatin (LIPITOR) 80 MG tablet, Take 1 tablet (80 mg total) by mouth daily at 6 PM., Disp: 30 tablet, Rfl: 11 .  cetirizine (ZYRTEC) 10 MG tablet, Take 10 mg by mouth daily at 12 noon. , Disp: , Rfl:  .  clopidogrel (PLAVIX) 75 MG tablet, Take 1 tablet (75 mg total) by mouth daily., Disp: 30 tablet, Rfl: 1 .  lisinopril (PRINIVIL,ZESTRIL) 2.5 MG tablet, Take 1 tablet (2.5 mg total) by mouth daily., Disp: 30 tablet, Rfl: 1 .  metoprolol tartrate (LOPRESSOR) 25 MG tablet, Take 1 tablet (25 mg total) by mouth 2 (two) times daily., Disp: 60 tablet, Rfl: 11 .  Multiple Vitamins-Minerals (ICAPS MV PO), Take 1 tablet by mouth 2 (two) times daily. , Disp: , Rfl:  .  pantoprazole (PROTONIX) 40 MG tablet, Take 40 mg by mouth daily., Disp: , Rfl:  .  Polyvinyl Alcohol-Povidone (REFRESH OP), Apply 1 drop to eye 4 (four) times daily as needed (dry eyes)., Disp: , Rfl:  .  tamsulosin (FLOMAX) 0.4 MG CAPS capsule, Take 0.4 mg by mouth daily., Disp: , Rfl:  .  traMADol (ULTRAM) 50 MG tablet, Take 1 tablet (50 mg total) by mouth every 4 (four) hours as needed for moderate pain., Disp: 30 tablet, Rfl: 0  Past Medical History: Past Medical History:  Diagnosis Date  . Anginal  pain ([place])   . Arthritis    "probably"  . BPH (benign prostatic hyperplasia)   . Coronary artery disease   . Dyspnea   . GERD (gastroesophageal reflux disease)   . History of kidney stones   . Hypertension   . Kidney stones   . Macular degeneration   . Myocardial infarction   . Seasonal allergies     Tobacco Use: History  Smoking Status  . Former Smoker  . Packs/day: 3.00  . Years: 16.00  . Types: Cigarettes  . Quit date: 02/04/1979  Smokeless Tobacco  . Never Used    Labs: Recent Review Flowsheet Data    Labs for ITP Cardiac and Pulmonary Rehab Latest Ref Rng & Units 10/03/2016 10/03/2016 10/03/2016 10/03/2016 10/04/2016   Cholestrol 0 - 200 mg/dL - - - - -   LDLCALC 0 - 99 mg/dL - - - - -   HDL >40 mg/dL - - - - -   Trlycerides <150 mg/dL - - - - -   Hemoglobin A1c 4.8 - 5.6 % - - - - -   PHART 7.350 - 7.450 7.308(L) - 7.349(L) 7.390 -   PCO2ART 32.0 - 48.0 mmHg 43.1 - 39.3 36.0 -   HCO3 20.0 - 28.0 mmol/L 21.6 - 21.6 21.7 -   TCO2 0 - 100 mmol/L 23 23 23 23  23  ACIDBASEDEF 0.0 - 2.0 mmol/L 4.0(H) - 4.0(H) 3.0(H) -   O2SAT % 98.0 - 99.0 99.0 -      Capillary Blood Glucose: Lab Results  Component Value Date   GLUCAP 108 (H) 10/08/2016   GLUCAP 116 (H) 10/07/2016   GLUCAP 113 (H) 10/07/2016   GLUCAP 113 (H) 10/07/2016   GLUCAP 134 (H) 10/06/2016     Exercise Target Goals:    Exercise Program Goal: Individual exercise prescription set with THRR, safety & activity barriers. Participant demonstrates ability to understand and report RPE using BORG scale, to self-measure pulse accurately, and to acknowledge the importance of the exercise prescription.  Exercise Prescription Goal: Starting with aerobic activity 30 plus minutes a day, 3 days per week for initial exercise prescription. Provide home exercise prescription and guidelines that participant acknowledges understanding prior to discharge.  Activity Barriers & Risk Stratification:     Activity  Barriers & Cardiac Risk Stratification - 12/11/16 1225      Activity Barriers & Cardiac Risk Stratification   Activity Barriers None   Cardiac Risk Stratification High      6 Minute Walk:     6 Minute Walk    Row Name 12/11/16 1203         6 Minute Walk   Phase Initial     Distance 1500 feet     Distance % Change 0 %     Walk Time 6 minutes     # of Rest Breaks 0     MPH 2.84     METS 3.17     RPE 13     Perceived Dyspnea  13     VO2 Peak 12.2     Symptoms No     Resting HR 63 bpm     Resting BP 142/76     Max Ex. HR 80 bpm     Max Ex. BP 150/84     2 Minute Post BP 140/74        Oxygen Initial Assessment:     Oxygen Initial Assessment - 12/11/16 1224      Home Oxygen   Home Oxygen Device None   Sleep Oxygen Prescription None   Home Exercise Oxygen Prescription None   Home at Rest Exercise Oxygen Prescription None     Initial 6 min Walk   Oxygen Used None     Program Oxygen Prescription   Program Oxygen Prescription None      Oxygen Re-Evaluation:   Oxygen Discharge (Final Oxygen Re-Evaluation):   Initial Exercise Prescription:     Initial Exercise Prescription - 12/11/16 1200      Date of Initial Exercise RX and Referring Provider   Date 12/11/16   Referring Provider Dr. Angelena Form     Treadmill   MPH 1.8   Grade 0   Minutes 15   METs 2.3     NuStep   Level 2   SPM 19   Minutes 20   METs 1.9     Prescription Details   Frequency (times per week) 3   Duration Progress to 30 minutes of continuous aerobic without signs/symptoms of physical distress     Intensity   THRR 40-80% of Max Heartrate [phone]   Ratings of Perceived Exertion 11-13   Perceived Dyspnea 0-4     Progression   Progression Continue progressive overload as per policy without signs/symptoms or physical distress.     Resistance Training   Training Prescription Yes   Weight 1  Reps 10-15      Perform Capillary Blood Glucose checks as needed.  Exercise  Prescription Changes:      Exercise Prescription Changes    Row Name 12/24/16 1100 01/06/17 1200           Response to Exercise   Blood Pressure (Admit) 118/60 134/72      Blood Pressure (Exercise) 138/74 146/70      Blood Pressure (Exit) 116/62 128/70      Heart Rate (Admit) 71 bpm 65 bpm      Heart Rate (Exercise) 99 bpm 94 bpm      Heart Rate (Exit) 79 bpm 96 bpm      Rating of Perceived Exertion (Exercise) 10 10      Duration Progress to 30 minutes of  aerobic without signs/symptoms of physical distress Progress to 30 minutes of  aerobic without signs/symptoms of physical distress      Intensity THRR unchanged THRR unchanged        Progression   Progression Continue to progress workloads to maintain intensity without signs/symptoms of physical distress. Continue to progress workloads to maintain intensity without signs/symptoms of physical distress.        Resistance Training   Training Prescription Yes Yes      Weight 3 4      Reps 10-15 10-15        Treadmill   MPH 2.3 2.6      Grade 0 0      Minutes 15 15      METs 2.7 2.98        NuStep   Level 3 3      SPM 21 35      Minutes 20 20      METs 3.1 3.57        Home Exercise Plan   Plans to continue exercise at Home (comment) Home (comment)      Frequency Add 2 additional days to program exercise sessions. Add 2 additional days to program exercise sessions.         Exercise Comments:      Exercise Comments    Row Name 12/24/16 1142 01/06/17 1245         Exercise Comments Patient is doing very well in CR rehab Patient is doing well in CR.          Exercise Goals and Review:      Exercise Goals    Row Name 12/11/16 1228             Exercise Goals   Increase Physical Activity Yes       Intervention Provide advice, education, support and counseling about physical activity/exercise needs.;Develop an individualized exercise prescription for aerobic and resistive training based on initial  evaluation findings, risk stratification, comorbidities and participant's personal goals.       Expected Outcomes Achievement of increased cardiorespiratory fitness and enhanced flexibility, muscular endurance and strength shown through measurements of functional capacity and personal statement of participant.       Increase Strength and Stamina Yes       Intervention Provide advice, education, support and counseling about physical activity/exercise needs.;Develop an individualized exercise prescription for aerobic and resistive training based on initial evaluation findings, risk stratification, comorbidities and participant's personal goals.       Expected Outcomes Achievement of increased cardiorespiratory fitness and enhanced flexibility, muscular endurance and strength shown through measurements of functional capacity and personal statement of participant.  Exercise Goals Re-Evaluation :     Exercise Goals Re-Evaluation    Row Name 01/07/17 1440             Exercise Goal Re-Evaluation   Exercise Goals Review Increase Physical Activity;Increase Strenth and Stamina  Get back to doing ADL's.        Comments After 12 sessions, patient has progressed well with increased strength, stamina, and activity. He says he is feeling stronger and is doing more things around the house.        Expected Outcomes Patient will complete the program with continued increased strength, stamina, and activity.           Discharge Exercise Prescription (Final Exercise Prescription Changes):     Exercise Prescription Changes - 01/06/17 1200      Response to Exercise   Blood Pressure (Admit) 134/72   Blood Pressure (Exercise) 146/70   Blood Pressure (Exit) 128/70   Heart Rate (Admit) 65 bpm   Heart Rate (Exercise) 94 bpm   Heart Rate (Exit) 96 bpm   Rating of Perceived Exertion (Exercise) 10   Duration Progress to 30 minutes of  aerobic without signs/symptoms of physical distress   Intensity  THRR unchanged     Progression   Progression Continue to progress workloads to maintain intensity without signs/symptoms of physical distress.     Resistance Training   Training Prescription Yes   Weight 4   Reps 10-15     Treadmill   MPH 2.6   Grade 0   Minutes 15   METs 2.98     NuStep   Level 3   SPM 35   Minutes 20   METs 3.57     Home Exercise Plan   Plans to continue exercise at Home (comment)   Frequency Add 2 additional days to program exercise sessions.      Nutrition:  Target Goals: Understanding of nutrition guidelines, daily intake of sodium 1500mg , cholesterol 200mg , calories 30% from fat and 7% or less from saturated fats, daily to have 5 or more servings of fruits and vegetables.  Biometrics:     Pre Biometrics - 12/11/16 1205      Pre Biometrics   Height 5\' 9"  (1.753 m)   Weight 178 lb 12.7 oz (81.1 kg)   Waist Circumference 37 inches   Hip Circumference 38 inches   Waist to Hip Ratio 0.97 %   BMI (Calculated) 26.5   Triceps Skinfold 11 mm   % Body Fat 24.2 %   Grip Strength 67.67 kg   Flexibility 0 in   Single Leg Stand 5 seconds       Nutrition Therapy Plan and Nutrition Goals:   Nutrition Discharge: Rate Your Plate Scores:     Nutrition Assessments - 12/11/16 1230      MEDFICTS Scores   Pre Score 9      Nutrition Goals Re-Evaluation:   Nutrition Goals Discharge (Final Nutrition Goals Re-Evaluation):   Psychosocial: Target Goals: Acknowledge presence or absence of significant depression and/or stress, maximize coping skills, provide positive support system. Participant is able to verbalize types and ability to use techniques and skills needed for reducing stress and depression.  Initial Review & Psychosocial Screening:     Initial Psych Review & Screening - 12/11/16 1233      Initial Review   Current issues with None Identified     Family Dynamics   Good Support System? Yes     Barriers   Psychosocial barriers  to participate in program There are no identifiable barriers or psychosocial needs.     Screening Interventions   Interventions Encouraged to exercise      Quality of Life Scores:     Quality of Life - 12/11/16 1205      Quality of Life Scores   Health/Function Pre 24.8 %   Socioeconomic Pre 23.25 %   Psych/Spiritual Pre 24.86 %   Family Pre 26.4 %   GLOBAL Pre 24.69 %      PHQ-9: Recent Review Flowsheet Data    Depression screen [facility] 2/9 12/11/2016   Decreased Interest 0   PHQ - 2 Score 0   Altered sleeping 0   Tired, decreased energy 0   Change in appetite 0   Feeling bad or failure about yourself  0   Trouble concentrating 0   Moving slowly or fidgety/restless 0   Suicidal thoughts 0   PHQ-9 Score 0   Difficult doing work/chores Somewhat difficult     Interpretation of Total Score  Total Score Depression Severity:  1-4 = Minimal depression, 5-9 = Mild depression, 10-14 = Moderate depression, 15-19 = Moderately severe depression, 20-27 = Severe depression   Psychosocial Evaluation and Intervention:     Psychosocial Evaluation - 12/11/16 1233      Psychosocial Evaluation & Interventions   Interventions Encouraged to exercise with the program and follow exercise prescription   Continue Psychosocial Services  No Follow up required      Psychosocial Re-Evaluation:     Psychosocial Re-Evaluation    [place] Name 01/07/17 1442             Psychosocial Re-Evaluation   Current issues with None Identified       Comments Patient's QOL score is 24.69 and his PHQ-9 score is 9. No psychosocial barriers identified.        Expected Outcomes Patient will have no psychosocial barriers identified at discharge.        Interventions Encouraged to attend Cardiac Rehabilitation for the exercise       Continue Psychosocial Services  No Follow up required          Psychosocial Discharge (Final Psychosocial Re-Evaluation):     Psychosocial Re-Evaluation - 01/07/17 1442       Psychosocial Re-Evaluation   Current issues with None Identified   Comments Patient's QOL score is 24.69 and his PHQ-9 score is 9. No psychosocial barriers identified.    Expected Outcomes Patient will have no psychosocial barriers identified at discharge.    Interventions Encouraged to attend Cardiac Rehabilitation for the exercise   Continue Psychosocial Services  No Follow up required      Vocational Rehabilitation: Provide vocational rehab assistance to qualifying candidates.   Vocational Rehab Evaluation & Intervention:     Vocational Rehab - 12/11/16 1221      Initial Vocational Rehab Evaluation & Intervention   Assessment shows need for Vocational Rehabilitation No  Self employed      Education: Education Goals: Education classes will be provided on a weekly basis, covering required topics. Participant will state understanding/return demonstration of topics presented.  Learning Barriers/Preferences:     Learning Barriers/Preferences - 12/11/16 1220      Learning Barriers/Preferences   Learning Barriers None   Learning Preferences Skilled Demonstration;Individual Instruction;Group Instruction      Education Topics: Hypertension, Hypertension Reduction -Define heart disease and high blood pressure. Discus how high blood pressure affects the body and ways to reduce high blood pressure.  Exercise and Your Heart -Discuss why it is important to exercise, the FITT principles of exercise, normal and abnormal responses to exercise, and how to exercise safely.   Angina -Discuss definition of angina, causes of angina, treatment of angina, and how to decrease risk of having angina.   Cardiac Medications -Review what the following cardiac medications are used for, how they affect the body, and side effects that may occur when taking the medications.  Medications include Aspirin, Beta blockers, calcium channel blockers, ACE Inhibitors, angiotensin receptor blockers,  diuretics, digoxin, and antihyperlipidemics.   Congestive Heart Failure -Discuss the definition of CHF, how to live with CHF, the signs and symptoms of CHF, and how keep track of weight and sodium intake.   Heart Disease and Intimacy -Discus the effect sexual activity has on the heart, how changes occur during intimacy as we age, and safety during sexual activity.   Smoking Cessation / COPD -Discuss different methods to quit smoking, the health benefits of quitting smoking, and the definition of COPD.   CARDIAC REHAB PHASE II EXERCISE from 01/07/2017 in [place]  Date  12/17/16  Educator  DC  Instruction Review Code  2- meets goals/outcomes      Nutrition I: Fats -Discuss the types of cholesterol, what cholesterol does to the heart, and how cholesterol levels can be controlled.   CARDIAC REHAB PHASE II EXERCISE from 01/07/2017 in [place]  Date  12/24/16  Educator  DC  Instruction Review Code  2- meets goals/outcomes      Nutrition II: Labels -Discuss the different components of food labels and how to read food label   CARDIAC REHAB PHASE II EXERCISE from 01/07/2017 in [place]  Date  12/31/16  Educator  Dc  Instruction Review Code  2- meets goals/outcomes      Heart Parts and Heart Disease -Discuss the anatomy of the heart, the pathway of blood circulation through the heart, and these are affected by heart disease.   CARDIAC REHAB PHASE II EXERCISE from 01/07/2017 in [place]  Date  01/07/17  Educator  DJ  Instruction Review Code  2- meets goals/outcomes      Stress I: Signs and Symptoms -Discuss the causes of stress, how stress may lead to anxiety and depression, and ways to limit stress.   Stress II: Relaxation -Discuss different types of relaxation techniques to limit stress.   Warning Signs of Stroke / TIA -Discuss definition of a stroke, what the signs and symptoms  are of a stroke, and how to identify when someone is having stroke.   Knowledge Questionnaire Score:     Knowledge Questionnaire Score - 12/11/16 1221      Knowledge Questionnaire Score   Pre Score 17/24      Core Components/Risk Factors/Patient Goals at Admission:     Personal Goals and Risk Factors at Admission - 12/11/16 1231      Core Components/Risk Factors/Patient Goals on Admission    Weight Management Weight Maintenance   Tobacco Cessation --  Quit 1980   Personal Goal Other Yes   Personal Goal Get stronger and to get back to normal ADL's   Intervention Attend CR 3 x week and supplement home exercise 2 x week.   Expected Outcomes Achieve personal goals      Core Components/Risk Factors/Patient Goals Review:      Goals and Risk Factor Review    Row Name 12/11/16 1232 01/07/17 1439  Core Components/Risk Factors/Patient Goals Review   Personal Goals Review Weight Management/Obesity  Get stronger and get back to normal ADL's Weight Management/Obesity      Review  - Patient has completed 12 sessions gaining 6 lbs. He plans to attend class with dietitician 01/08/17. Will continue to monitor.       Expected Outcomes  - Patient will complete the program and start to lose weight.          Core Components/Risk Factors/Patient Goals at Discharge (Final Review):      Goals and Risk Factor Review - 01/07/17 1439      Core Components/Risk Factors/Patient Goals Review   Personal Goals Review Weight Management/Obesity   Review Patient has completed 12 sessions gaining 6 lbs. He plans to attend class with dietitician 01/08/17. Will continue to monitor.    Expected Outcomes Patient will complete the program and start to lose weight.       ITP Comments:   Comments: ITP 30 Day REVIEW Patient doing well in the program. Will continue to monitor for progress.

## 2017-01-09 ENCOUNTER — Encounter (HOSPITAL_COMMUNITY)
Admission: RE | Admit: 2017-01-09 | Discharge: 2017-01-09 | Disposition: A | Discharge status: Home / Self Care | Payer: Medicare Other | Source: Ambulatory Visit | Attending: Cardiovascular Disease | Admitting: Cardiovascular Disease

## 2017-01-09 DIAGNOSIS — Z951 Presence of aortocoronary bypass graft: Secondary | ICD-10-CM

## 2017-01-09 DIAGNOSIS — I2510 Atherosclerotic heart disease of native coronary artery without angina pectoris: Secondary | ICD-10-CM | POA: Diagnosis not present

## 2017-01-09 NOTE — Progress Notes (Signed)
Daily Session Note  Patient Details  Name: Scott Jacobson MRN: [id number] Date of Birth: Aug 22, 1951 Referring Provider:     CARDIAC REHAB PHASE II ORIENTATION from 12/11/2016 in [place]  Referring Provider  Dr. Angelena Form      Encounter Date: 01/09/2017  Check In:     Session Check In - 01/09/17 0815      Check-In   Location [facility]   Staff Present Aundra Dubin, RN, BSN;Sabria Florido Luther Parody, BS, EP, Exercise Physiologist   Supervising physician immediately available to respond to emergencies See telemetry face sheet for immediately available MD   Medication changes reported     No   Fall or balance concerns reported    No   Warm-up and Cool-down Performed as group-led instruction   Resistance Training Performed Yes   VAD Patient? No     Pain Assessment   Currently in Pain? No/denies   Pain Score 0-No pain   Multiple Pain Sites No      Capillary Blood Glucose: No results found for this or any previous visit (from the past 24 hour(s)).    History  Smoking Status  . Former Smoker  . Packs/day: 3.00  . Years: 16.00  . Types: Cigarettes  . Quit date: 02/04/1979  Smokeless Tobacco  . Never Used    Goals Met:  Independence with exercise equipment Exercise tolerated well No report of cardiac concerns or symptoms Strength training completed today  Goals Unmet:  Not Applicable  Comments: Check out 915   Dr. Kate Sable is Medical Director for [facility] and Pulmonary Rehab.

## 2017-01-12 ENCOUNTER — Encounter (HOSPITAL_COMMUNITY)
Admission: RE | Admit: 2017-01-12 | Discharge: 2017-01-12 | Disposition: A | Discharge status: Home / Self Care | Payer: Medicare Other | Source: Ambulatory Visit | Attending: Cardiovascular Disease | Admitting: Cardiovascular Disease

## 2017-01-12 DIAGNOSIS — Z951 Presence of aortocoronary bypass graft: Secondary | ICD-10-CM | POA: Diagnosis not present

## 2017-01-12 DIAGNOSIS — I2510 Atherosclerotic heart disease of native coronary artery without angina pectoris: Secondary | ICD-10-CM | POA: Diagnosis not present

## 2017-01-12 NOTE — Progress Notes (Signed)
Daily Session Note  Patient Details  Name: Scott Jacobson MRN: [id number] Date of Birth: 1951/05/08 Referring Provider:     [place] from 12/11/2016 in [place]  Referring Provider  Dr. Angelena Form      Encounter Date: 01/12/2017  Check In:     Session Check In - 01/12/17 0815      Check-In   Location [facility]   Staff Present Russella Dar, MS, EP, [facility], Exercise Physiologist;Estha Few Luther Parody, BS, EP, Exercise Physiologist   Supervising physician immediately available to respond to emergencies See telemetry face sheet for immediately available MD   Medication changes reported     No   Fall or balance concerns reported    No   Warm-up and Cool-down Performed as group-led instruction   Resistance Training Performed Yes   VAD Patient? No     Pain Assessment   Currently in Pain? No/denies   Pain Score 0-No pain   Multiple Pain Sites No      Capillary Blood Glucose: No results found for this or any previous visit (from the past 24 hour(s)).    History  Smoking Status  . Former Smoker  . Packs/day: 3.00  . Years: 16.00  . Types: Cigarettes  . Quit date: 02/04/1979  Smokeless Tobacco  . Never Used    Goals Met:  Independence with exercise equipment Exercise tolerated well No report of cardiac concerns or symptoms Strength training completed today  Goals Unmet:  Not Applicable  Comments: Check out 915   Dr. Kate Sable is Medical Director for [facility] and Pulmonary Rehab.

## 2017-01-14 ENCOUNTER — Encounter (HOSPITAL_COMMUNITY)
Admission: RE | Admit: 2017-01-14 | Discharge: 2017-01-14 | Disposition: A | Discharge status: Home / Self Care | Payer: Medicare Other | Source: Ambulatory Visit | Attending: Cardiovascular Disease | Admitting: Cardiovascular Disease

## 2017-01-14 DIAGNOSIS — Z951 Presence of aortocoronary bypass graft: Secondary | ICD-10-CM

## 2017-01-14 DIAGNOSIS — I2510 Atherosclerotic heart disease of native coronary artery without angina pectoris: Secondary | ICD-10-CM | POA: Diagnosis not present

## 2017-01-14 NOTE — Progress Notes (Signed)
Daily Session Note  Patient Details  Name: Scott Jacobson MRN: [id number] Date of Birth: 27-Jul-1951 Referring Provider:     CARDIAC REHAB PHASE II ORIENTATION from 12/11/2016 in [place]  Referring Provider  Dr. Angelena Form      Encounter Date: 01/14/2017  Check In:     Session Check In - 01/14/17 0815      Check-In   Location [facility]   Staff Present Aundra Dubin, RN, BSN;Annora Guderian Luther Parody, BS, EP, Exercise Physiologist   Supervising physician immediately available to respond to emergencies See telemetry face sheet for immediately available MD   Medication changes reported     No   Fall or balance concerns reported    No   Warm-up and Cool-down Performed as group-led instruction   Resistance Training Performed Yes   VAD Patient? No     Pain Assessment   Currently in Pain? No/denies   Pain Score 0-No pain   Multiple Pain Sites No      Capillary Blood Glucose: No results found for this or any previous visit (from the past 24 hour(s)).    History  Smoking Status  . Former Smoker  . Packs/day: 3.00  . Years: 16.00  . Types: Cigarettes  . Quit date: 02/04/1979  Smokeless Tobacco  . Never Used    Goals Met:  Independence with exercise equipment Exercise tolerated well No report of cardiac concerns or symptoms Strength training completed today  Goals Unmet:  Not Applicable  Comments: Check out 915   Dr. Kate Sable is Medical Director for [place] and Pulmonary Rehab.

## 2017-01-16 ENCOUNTER — Encounter (HOSPITAL_COMMUNITY)
Admission: RE | Admit: 2017-01-16 | Discharge: 2017-01-16 | Disposition: A | Discharge status: Home / Self Care | Payer: Medicare Other | Source: Ambulatory Visit | Attending: Cardiovascular Disease | Admitting: Cardiovascular Disease

## 2017-01-16 DIAGNOSIS — K210 Gastro-esophageal reflux disease with esophagitis: Secondary | ICD-10-CM | POA: Diagnosis not present

## 2017-01-16 DIAGNOSIS — Z951 Presence of aortocoronary bypass graft: Secondary | ICD-10-CM

## 2017-01-16 DIAGNOSIS — I2510 Atherosclerotic heart disease of native coronary artery without angina pectoris: Secondary | ICD-10-CM | POA: Diagnosis not present

## 2017-01-16 DIAGNOSIS — I25798 Atherosclerosis of other coronary artery bypass graft(s) with other forms of angina pectoris: Secondary | ICD-10-CM | POA: Diagnosis not present

## 2017-01-16 DIAGNOSIS — I10 Essential (primary) hypertension: Secondary | ICD-10-CM | POA: Diagnosis not present

## 2017-01-16 DIAGNOSIS — Z0000 Encounter for general adult medical examination without abnormal findings: Secondary | ICD-10-CM | POA: Diagnosis not present

## 2017-01-16 DIAGNOSIS — I2111 ST elevation (STEMI) myocardial infarction involving right coronary artery: Secondary | ICD-10-CM | POA: Diagnosis not present

## 2017-01-16 DIAGNOSIS — N4289 Other specified disorders of prostate: Secondary | ICD-10-CM | POA: Diagnosis not present

## 2017-01-16 NOTE — Progress Notes (Signed)
Daily Session Note  Patient Details  Name: Scott Jacobson MRN: [id number] Date of Birth: 04-Apr-1951 Referring Provider:     CARDIAC REHAB PHASE II ORIENTATION from 12/11/2016 in [place]  Referring Provider  Dr. Angelena Form      Encounter Date: 01/16/2017  Check In:     Session Check In - 01/16/17 0821      Check-In   Location [facility]   Staff Present Aundra Dubin, RN, BSN;Shaneta Cervenka Luther Parody, BS, EP, Exercise Physiologist   Supervising physician immediately available to respond to emergencies See telemetry face sheet for immediately available MD   Medication changes reported     No   Fall or balance concerns reported    No   Warm-up and Cool-down Performed as group-led instruction   Resistance Training Performed Yes   VAD Patient? No     Pain Assessment   Currently in Pain? No/denies   Pain Score 0-No pain   Multiple Pain Sites No      Capillary Blood Glucose: No results found for this or any previous visit (from the past 24 hour(s)).    History  Smoking Status  . Former Smoker  . Packs/day: 3.00  . Years: 16.00  . Types: Cigarettes  . Quit date: 02/04/1979  Smokeless Tobacco  . Never Used    Goals Met:  Independence with exercise equipment Exercise tolerated well No report of cardiac concerns or symptoms Strength training completed today  Goals Unmet:  Not Applicable  Comments: Check out 915   Dr. Kate Sable is Medical Director for [place] and [facility].

## 2017-01-19 ENCOUNTER — Other Ambulatory Visit: Payer: Self-pay | Admitting: Cardiology

## 2017-01-19 ENCOUNTER — Encounter (HOSPITAL_COMMUNITY)
Admission: RE | Admit: 2017-01-19 | Discharge: 2017-01-19 | Disposition: A | Discharge status: Home / Self Care | Payer: Medicare Other | Source: Ambulatory Visit | Attending: Cardiovascular Disease | Admitting: Cardiovascular Disease

## 2017-01-19 DIAGNOSIS — Z951 Presence of aortocoronary bypass graft: Secondary | ICD-10-CM

## 2017-01-19 DIAGNOSIS — I2510 Atherosclerotic heart disease of native coronary artery without angina pectoris: Secondary | ICD-10-CM | POA: Diagnosis not present

## 2017-01-19 MED ORDER — ATORVASTATIN CALCIUM 80 MG PO TABS: 80.0000 mg | ORAL_TABLET | Freq: Every day | ORAL | 2 refills | Status: DC

## 2017-01-19 NOTE — Progress Notes (Signed)
Daily Session Note  Patient Details  Name: Scott Jacobson MRN: [id number] Date of Birth: 1951-04-04 Referring Provider:     CARDIAC REHAB PHASE II ORIENTATION from 12/11/2016 in [place]  Referring Provider  Dr. Angelena Form      Encounter Date: 01/19/2017  Check In:     Session Check In - 01/19/17 0828      Check-In   Location [facility]   Staff Present Aundra Dubin, RN, BSN;Dim Meisinger Luther Parody, BS, EP, Exercise Physiologist   Supervising physician immediately available to respond to emergencies See telemetry face sheet for immediately available MD   Medication changes reported     No   Fall or balance concerns reported    No   Warm-up and Cool-down Performed as group-led instruction   Resistance Training Performed Yes   VAD Patient? No     Pain Assessment   Currently in Pain? No/denies   Pain Score 0-No pain   Multiple Pain Sites No      Capillary Blood Glucose: No results found for this or any previous visit (from the past 24 hour(s)).    History  Smoking Status  . Former Smoker  . Packs/day: 3.00  . Years: 16.00  . Types: Cigarettes  . Quit date: 02/04/1979  Smokeless Tobacco  . Never Used    Goals Met:  Independence with exercise equipment Exercise tolerated well No report of cardiac concerns or symptoms Strength training completed today  Goals Unmet:  Not Applicable  Comments: Check out 915   Dr. Kate Sable is Medical Director for [place] and [facility].

## 2017-01-21 ENCOUNTER — Encounter (HOSPITAL_COMMUNITY)
Admission: RE | Admit: 2017-01-21 | Discharge: 2017-01-21 | Disposition: A | Discharge status: Home / Self Care | Payer: Medicare Other | Source: Ambulatory Visit | Attending: Cardiovascular Disease | Admitting: Cardiovascular Disease

## 2017-01-21 DIAGNOSIS — I2510 Atherosclerotic heart disease of native coronary artery without angina pectoris: Secondary | ICD-10-CM | POA: Diagnosis not present

## 2017-01-21 DIAGNOSIS — Z951 Presence of aortocoronary bypass graft: Secondary | ICD-10-CM | POA: Diagnosis not present

## 2017-01-21 NOTE — Progress Notes (Signed)
Chief Complaint  Patient presents with  . Follow-up   History of Present Illness: 66 yo male with history of HTN, HLD and CAD who is here today for cardiac follow up. He was admitted to [facility] November 2017 with an inferior STEMI. His sub-totally occluded RCA was treated with a bare metal stent. He was also found to have severe disease in the left main artery and Circumflex. He underwent 4V CABG (LIMA to LAD, SVG to Diagonal, SVG to OM, SVG to PDA) on 10/03/16 after he had completed 1 month of dual antiplatelet therapy post PCI/bare metal stent placement. LVEF 60-65% by echo November 2017.   He is here today for follow up. The patient denies any chest pain, dyspnea, palpitations, lower extremity edema, orthopnea, PND, dizziness, near syncope or syncope. He is in cardiac rehab and doing well.    Primary Care Physician: Neale Burly, MD  Past Medical History:  Diagnosis Date  . Anginal pain ([place])   . Arthritis    "probably"  . BPH (benign prostatic hyperplasia)   . Coronary artery disease   . Dyspnea   . GERD (gastroesophageal reflux disease)   . History of kidney stones   . Hypertension   . Kidney stones   . Macular degeneration   . Myocardial infarction ([place])   . Seasonal allergies     Past Surgical History:  Procedure Laterality Date  . APPENDECTOMY    . BACK SURGERY    . CARDIAC CATHETERIZATION N/A 08/21/2016   Procedure: Left Heart Cath and Coronary Angiography;  Surgeon: Burnell Blanks, MD;  Location: [place] [facility];  Service: Cardiovascular;  Laterality: N/A;  . CARDIAC CATHETERIZATION N/A 08/21/2016   Procedure: Coronary Stent Intervention;  Surgeon: Burnell Blanks, MD;  Location: [place] [facility];  Service: Cardiovascular;  Laterality: N/A;  . CATARACT EXTRACTION W/ INTRAOCULAR LENS  IMPLANT, BILATERAL Bilateral   . COLONOSCOPY    . CORONARY ARTERY BYPASS GRAFT N/A 10/03/2016   Procedure: CORONARY ARTERY BYPASS GRAFTING (CABG)x 4 with  endoscopic harvesting of right saphenous vein ,LIMA-LAD SVG-DIAG SVG-OM SVG-RCA;  Surgeon: Gaye Pollack, MD;  Location: [place];  Service: Open Heart Surgery;  Laterality: N/A;  . EYE SURGERY    . GAS INSERTION Right 02/05/2016   Procedure: INSERTION OF GAS-[id number];  Surgeon: Hayden Pedro, MD;  Location: [place];  Service: Ophthalmology;  Laterality: Right;  . HAND SURGERY Right    thumb; S/P "cut his arm"  . LASER PHOTO ABLATION Right 02/05/2016   Procedure: LASER PHOTO ABLATION-HEADSCOPE LASER;  Surgeon: Hayden Pedro, MD;  Location: [place];  Service: Ophthalmology;  Laterality: Right;  . LUMBAR DISC SURGERY  ~ 2002  . RETINAL DETACHMENT REPAIR W/ SCLERAL BUCKLE LE Right 02/05/2016  . SCLERAL BUCKLE Right 02/05/2016   Procedure: SCLERAL BUCKLE;  Surgeon: Hayden Pedro, MD;  Location: [place];  Service: Ophthalmology;  Laterality: Right;  . TEE WITHOUT CARDIOVERSION N/A 10/03/2016   Procedure: TRANSESOPHAGEAL ECHOCARDIOGRAM (TEE);  Surgeon: Gaye Pollack, MD;  Location: [place];  Service: Open Heart Surgery;  Laterality: N/A;  . TONSILLECTOMY      Current Outpatient Prescriptions  Medication Sig Dispense Refill  . aspirin EC 81 MG tablet Take 1 tablet (81 mg total) by mouth daily.    [name] atorvastatin (LIPITOR) 80 MG tablet Take 1 tablet (80 mg total) by mouth daily at 6 PM. 90 tablet 2  . cetirizine (ZYRTEC) 10 MG tablet Take 10  mg by mouth daily at 12 noon.     . clopidogrel (PLAVIX) 75 MG tablet Take 1 tablet (75 mg total) by mouth daily. 30 tablet 1  . metoprolol tartrate (LOPRESSOR) 25 MG tablet Take 1 tablet (25 mg total) by mouth 2 (two) times daily. 60 tablet 11  . Multiple Vitamins-Minerals (ICAPS MV PO) Take 1 tablet by mouth 2 (two) times daily.     . pantoprazole (PROTONIX) 40 MG tablet Take 40 mg by mouth daily.    . Polyvinyl Alcohol-Povidone (REFRESH OP) Apply 1 drop to eye 4 (four) times daily as needed (dry eyes).    . ramipril (ALTACE) 5 MG capsule Take 5 mg by mouth daily.    .  tamsulosin (FLOMAX) 0.4 MG CAPS capsule Take 0.4 mg by mouth daily.    . traMADol (ULTRAM) 50 MG tablet Take 1 tablet (50 mg total) by mouth every 4 (four) hours as needed for moderate pain. 30 tablet 0   No current facility-administered medications for this visit.     Allergies  Allergen Reactions  . No Known Allergies     Social History   Social History  . Marital status: Married    Spouse name: N/A  . Number of children: N/A  . Years of education: N/A   Occupational History  . Not on file.   Social History Main Topics  . Smoking status: Former Smoker    Packs/day: 3.00    Years: 16.00    Types: Cigarettes    Quit date: 02/04/1979  . Smokeless tobacco: Never Used  . Alcohol use Yes     Comment: 02/06/2016 "a beer a couple times/month"  . Drug use: No  . Sexual activity: Not Currently   Other Topics Concern  . Not on file   Social History Narrative  . No narrative on file    Family History  Problem Relation Age of Onset  . Hypertension Mother   . Congestive Heart Failure Father     Review of Systems:  As stated in the HPI and otherwise negative.   BP 124/70   Pulse 63   Ht 5\' 9"  (1.753 m)   Wt 189 lb 12.8 oz (86.1 kg)   SpO2 99%   BMI 28.03 kg/m   Physical Examination: General: Well developed, well nourished, NAD  HEENT: OP clear, mucus membranes moist  SKIN: warm, dry. No rashes. Neuro: No focal deficits  Musculoskeletal: Muscle strength 5/5 all ext  Psychiatric: Mood and affect normal  Neck: No JVD, no carotid bruits, no thyromegaly, no lymphadenopathy.  Lungs:Clear bilaterally, no wheezes, rhonci, crackles Cardiovascular: Regular rate and rhythm. No murmurs, gallops or rubs. Abdomen:Soft. Bowel sounds present. Non-tender.  Extremities: No lower extremity edema. Pulses are 2 + in the bilateral DP/PT.  EKG:  EKG is not ordered today. The ekg ordered today demonstrates   Recent Labs: 10/01/2016: ALT 22 10/04/2016: Magnesium 2.5 10/06/2016: BUN  16; Creatinine, Ser 0.85; Hemoglobin 8.3; Platelets 143; Potassium 4.0; Sodium 136   Lipid Panel Followed in primary care.    Wt Readings from Last 3 Encounters:  01/22/17 189 lb 12.8 oz (86.1 kg)  12/11/16 178 lb 12.7 oz (81.1 kg)  11/12/16 174 lb (78.9 kg)     Other studies Reviewed: Additional studies/ records that were reviewed today include: . Review of the above records demonstrates:   Assessment and Plan:   1. CAD s/p CABG without angina: He has no chest pain suggestive of angina. He is s/p 4 V  CABG in December 2017. Will continue ASA, Plavix, statin and beta blocker.  2. HTN:  BP is controlled. No changes.   3. Hyperlipidemia: He is on a statin. Most recent LDL is 32 in primary care January 2018. (see scanned report). LFTs normal January 2018. Reviewed by me today   Current medicines are reviewed at length with the patient today.  The patient does not have concerns regarding medicines.  The following changes have been made:  no change  Labs/ tests ordered today include:  No orders of the defined types were placed in this encounter.   Disposition:   FU with me  in 6  months  Signed, Lauree Chandler, MD 01/22/2017 9:19 AM    [facility] [address], [place], [place]  [address] Phone: [phone]; Fax: [phone]

## 2017-01-21 NOTE — Progress Notes (Signed)
Daily Session Note  Patient Details  Name: Scott Jacobson MRN: [id number] Date of Birth: 1951-06-05 Referring Provider:     [place] from 12/11/2016 in [place]  Referring Provider  Dr. Angelena Form      Encounter Date: 01/21/2017  Check In:     Session Check In - 01/21/17 0807      Check-In   Location [facility]   Staff Present Aundra Dubin, RN, BSN;Madeline Bebout Luther Parody, BS, EP, Exercise Physiologist   Supervising physician immediately available to respond to emergencies See telemetry face sheet for immediately available MD   Medication changes reported     No   Fall or balance concerns reported    No   Warm-up and Cool-down Performed as group-led instruction   Resistance Training Performed Yes   VAD Patient? No     Pain Assessment   Currently in Pain? No/denies   Pain Score 0-No pain   Multiple Pain Sites No      Capillary Blood Glucose: No results found for this or any previous visit (from the past 24 hour(s)).    History  Smoking Status  . Former Smoker  . Packs/day: 3.00  . Years: 16.00  . Types: Cigarettes  . Quit date: 02/04/1979  Smokeless Tobacco  . Never Used    Goals Met:  Independence with exercise equipment Exercise tolerated well No report of cardiac concerns or symptoms Strength training completed today  Goals Unmet:  Not Applicable  Comments: Check out 915   Dr. Kate Sable is Medical Director for [place] and Pulmonary Re[facility].

## 2017-01-22 ENCOUNTER — Ambulatory Visit (INDEPENDENT_AMBULATORY_CARE_PROVIDER_SITE_OTHER): Payer: Medicare Other | Admitting: Cardiovascular Disease

## 2017-01-22 ENCOUNTER — Encounter: Payer: Self-pay | Admitting: Cardiovascular Disease

## 2017-01-22 VITALS — BP 124/70 | HR 63 | Ht 69.0 in | Wt 189.8 lb

## 2017-01-22 DIAGNOSIS — I10 Essential (primary) hypertension: Secondary | ICD-10-CM | POA: Diagnosis not present

## 2017-01-22 DIAGNOSIS — E7800 Pure hypercholesterolemia, unspecified: Secondary | ICD-10-CM

## 2017-01-22 DIAGNOSIS — I2510 Atherosclerotic heart disease of native coronary artery without angina pectoris: Secondary | ICD-10-CM | POA: Diagnosis not present

## 2017-01-22 NOTE — Patient Instructions (Signed)

## 2017-01-23 ENCOUNTER — Encounter (HOSPITAL_COMMUNITY)
Admission: RE | Admit: 2017-01-23 | Discharge: 2017-01-23 | Disposition: A | Discharge status: Home / Self Care | Payer: Medicare Other | Source: Ambulatory Visit | Attending: Cardiovascular Disease | Admitting: Cardiovascular Disease

## 2017-01-23 DIAGNOSIS — Z951 Presence of aortocoronary bypass graft: Secondary | ICD-10-CM | POA: Diagnosis not present

## 2017-01-23 DIAGNOSIS — I2510 Atherosclerotic heart disease of native coronary artery without angina pectoris: Secondary | ICD-10-CM | POA: Diagnosis not present

## 2017-01-23 NOTE — Progress Notes (Signed)
Daily Session Note  Patient Details  Name: AUTHUR CUBIT MRN: [id number] Date of Birth: [birth year] Referring Provider:     [place] from 12/11/2016 in [place]  Referring Provider  Dr. Angelena Form      Encounter Date: 01/23/2017  Check In:     Session Check In - 01/23/17 1700      Check-In   Location [facility]   Staff Present Aundra Dubin, RN, BSN;Lacey Wallman Luther Parody, BS, EP, Exercise Physiologist   Supervising physician immediately available to respond to emergencies See telemetry face sheet for immediately available MD   Medication changes reported     No   Fall or balance concerns reported    No   Warm-up and Cool-down Performed as group-led instruction   Resistance Training Performed Yes   VAD Patient? No     Pain Assessment   Currently in Pain? No/denies   Pain Score 0-No pain   Multiple Pain Sites No      Capillary Blood Glucose: No results found for this or any previous visit (from the past 24 hour(s)).    History  Smoking Status  . Former Smoker  . Packs/day: 3.00  . Years: 16.00  . Types: Cigarettes  . Quit date: 02/04/1979  Smokeless Tobacco  . Never Used    Goals Met:  Independence with exercise equipment Exercise tolerated well No report of cardiac concerns or symptoms Strength training completed today  Goals Unmet:  Not Applicable  Comments: Check out 915   Dr. Kate Sable is Medical Director for [place] and Pulmonary Rehab.

## 2017-01-26 ENCOUNTER — Encounter (HOSPITAL_COMMUNITY)
Admission: RE | Admit: 2017-01-26 | Discharge: 2017-01-26 | Disposition: A | Discharge status: Home / Self Care | Payer: Medicare Other | Source: Ambulatory Visit | Attending: Cardiovascular Disease | Admitting: Cardiovascular Disease

## 2017-01-26 DIAGNOSIS — I2119 ST elevation (STEMI) myocardial infarction involving other coronary artery of inferior wall: Secondary | ICD-10-CM

## 2017-01-26 DIAGNOSIS — Z951 Presence of aortocoronary bypass graft: Secondary | ICD-10-CM | POA: Diagnosis not present

## 2017-01-26 DIAGNOSIS — I2510 Atherosclerotic heart disease of native coronary artery without angina pectoris: Secondary | ICD-10-CM | POA: Diagnosis not present

## 2017-01-26 NOTE — Progress Notes (Signed)
Cardiac Individual Treatment Plan  Patient Details  Name: Scott Jacobson MRN: [id number] Date of Birth: [birth year] Referring Provider:     CARDIAC REHAB PHASE II ORIENTATION from 12/11/2016 in [facility]  Referring Provider  Dr. Angelena Form      Initial Encounter Date:    CARDIAC REHAB PHASE II ORIENTATION from 12/11/2016 in [facility]  Date  12/11/16  Referring Provider  Dr. Angelena Form      Visit Diagnosis: S/P CABG x 4  ST elevation myocardial infarction (STEMI) involving other coronary artery of inferior wall ([place])  Patient's Home Medications on Admission:  Current Outpatient Prescriptions:  .  aspirin EC 81 MG tablet, Take 1 tablet (81 mg total) by mouth daily., Disp: , Rfl:  .  atorvastatin (LIPITOR) 80 MG tablet, Take 1 tablet (80 mg total) by mouth daily at 6 PM., Disp: 90 tablet, Rfl: 2 .  cetirizine (ZYRTEC) 10 MG tablet, Take 10 mg by mouth daily at 12 noon. , Disp: , Rfl:  .  clopidogrel (PLAVIX) 75 MG tablet, Take 1 tablet (75 mg total) by mouth daily., Disp: 30 tablet, Rfl: 1 .  metoprolol tartrate (LOPRESSOR) 25 MG tablet, Take 1 tablet (25 mg total) by mouth 2 (two) times daily., Disp: 60 tablet, Rfl: 11 .  Multiple Vitamins-Minerals (ICAPS MV PO), Take 1 tablet by mouth 2 (two) times daily. , Disp: , Rfl:  .  pantoprazole (PROTONIX) 40 MG tablet, Take 40 mg by mouth daily., Disp: , Rfl:  .  Polyvinyl Alcohol-Povidone (REFRESH OP), Apply 1 drop to eye 4 (four) times daily as needed (dry eyes)., Disp: , Rfl:  .  ramipril (ALTACE) 5 MG capsule, Take 5 mg by mouth daily., Disp: , Rfl:  .  tamsulosin (FLOMAX) 0.4 MG CAPS capsule, Take 0.4 mg by mouth daily., Disp: , Rfl:  .  traMADol (ULTRAM) 50 MG tablet, Take 1 tablet (50 mg total) by mouth every 4 (four) hours as needed for moderate pain., Disp: 30 tablet, Rfl: 0  Past Medical History: Past Medical History:  Diagnosis Date  . Anginal pain ([place])   . Arthritis    "probably"  .  BPH (benign prostatic hyperplasia)   . Coronary artery disease   . Dyspnea   . GERD (gastroesophageal reflux disease)   . History of kidney stones   . Hypertension   . Kidney stones   . Macular degeneration   . Myocardial infarction ([place])   . Seasonal allergies     Tobacco Use: History  Smoking Status  . Former Smoker  . Packs/day: 3.00  . Years: 16.00  . Types: Cigarettes  . Quit date: 02/04/1979  Smokeless Tobacco  . Never Used    Labs: Recent Review Flowsheet Data    Labs for ITP Cardiac and Pulmonary Rehab Latest Ref Rng & Units 10/03/2016 10/03/2016 10/03/2016 10/03/2016 10/04/2016   Cholestrol 0 - 200 mg/dL - - - - -   LDLCALC 0 - 99 mg/dL - - - - -   HDL >40 mg/dL - - - - -   Trlycerides <150 mg/dL - - - - -   Hemoglobin A1c 4.8 - 5.6 % - - - - -   PHART 7.350 - 7.450 7.308(L) - 7.349(L) 7.390 -   PCO2ART 32.0 - 48.0 mmHg 43.1 - 39.3 36.0 -   HCO3 20.0 - 28.0 mmol/L 21.6 - 21.6 21.7 -   TCO2 0 - 100 mmol/L '23 23 23 23 23   '$ ACIDBASEDEF 0.0 -  2.0 mmol/L 4.0(H) - 4.0(H) 3.0(H) -   O2SAT % 98.0 - 99.0 99.0 -      Capillary Blood Glucose: Lab Results  Component Value Date   GLUCAP 108 (H) 10/08/2016   GLUCAP 116 (H) 10/07/2016   GLUCAP 113 (H) 10/07/2016   GLUCAP 113 (H) 10/07/2016   GLUCAP 134 (H) 10/06/2016     Exercise Target Goals:    Exercise Program Goal: Individual exercise prescription set with THRR, safety & activity barriers. Participant demonstrates ability to understand and report RPE using BORG scale, to self-measure pulse accurately, and to acknowledge the importance of the exercise prescription.  Exercise Prescription Goal: Starting with aerobic activity 30 plus minutes a day, 3 days per week for initial exercise prescription. Provide home exercise prescription and guidelines that participant acknowledges understanding prior to discharge.  Activity Barriers & Risk Stratification:     Activity Barriers & Cardiac Risk Stratification -  12/11/16 1225      Activity Barriers & Cardiac Risk Stratification   Activity Barriers None   Cardiac Risk Stratification High      6 Minute Walk:     6 Minute Walk    Row Name 12/11/16 1203         6 Minute Walk   Phase Initial     Distance 1500 feet     Distance % Change 0 %     Walk Time 6 minutes     # of Rest Breaks 0     MPH 2.84     METS 3.17     RPE 13     Perceived Dyspnea  13     VO2 Peak 12.2     Symptoms No     Resting HR 63 bpm     Resting BP 142/76     Max Ex. HR 80 bpm     Max Ex. BP 150/84     2 Minute Post BP 140/74        Oxygen Initial Assessment:     Oxygen Initial Assessment - 12/11/16 1224      Home Oxygen   Home Oxygen Device None   Sleep Oxygen Prescription None   Home Exercise Oxygen Prescription None   Home at Rest Exercise Oxygen Prescription None     Initial 6 min Walk   Oxygen Used None     Program Oxygen Prescription   Program Oxygen Prescription None      Oxygen Re-Evaluation:   Oxygen Discharge (Final Oxygen Re-Evaluation):   Initial Exercise Prescription:     Initial Exercise Prescription - 12/11/16 1200      Date of Initial Exercise RX and Referring Provider   Date 12/11/16   Referring Provider Dr. Angelena Form     Treadmill   MPH 1.8   Grade 0   Minutes 15   METs 2.3     NuStep   Level 2   SPM 19   Minutes 20   METs 1.9     Prescription Details   Frequency (times per week) 3   Duration Progress to 30 minutes of continuous aerobic without signs/symptoms of physical distress     Intensity   THRR 40-80% of Max Heartrate [phone]   Ratings of Perceived Exertion 11-13   Perceived Dyspnea 0-4     Progression   Progression Continue progressive overload as per policy without signs/symptoms or physical distress.     Resistance Training   Training Prescription Yes   Weight 1   Reps 10-15  Perform Capillary Blood Glucose checks as needed.  Exercise Prescription Changes:      Exercise  Prescription Changes    Row Name 12/24/16 1100 01/06/17 1200 01/23/17 1500         Response to Exercise   Blood Pressure (Admit) 118/60 134/72 146/80     Blood Pressure (Exercise) 138/74 146/70 144/78     Blood Pressure (Exit) 116/62 128/70 118/70     Heart Rate (Admit) 71 bpm 65 bpm 64 bpm     Heart Rate (Exercise) 99 bpm 94 bpm 97 bpm     Heart Rate (Exit) 79 bpm 96 bpm 73 bpm     Rating of Perceived Exertion (Exercise) '10 10 10     '$ Duration Progress to 30 minutes of  aerobic without signs/symptoms of physical distress Progress to 30 minutes of  aerobic without signs/symptoms of physical distress Progress to 30 minutes of  aerobic without signs/symptoms of physical distress     Intensity THRR unchanged THRR unchanged THRR unchanged       Progression   Progression Continue to progress workloads to maintain intensity without signs/symptoms of physical distress. Continue to progress workloads to maintain intensity without signs/symptoms of physical distress. Continue to progress workloads to maintain intensity without signs/symptoms of physical distress.       Resistance Training   Training Prescription Yes Yes Yes     Weight '3 4 4     '$ Reps 10-15 10-15 10-15       Treadmill   MPH 2.3 2.6 2.9     Grade 0 0 0.5     Minutes '15 15 15     '$ METs 2.7 2.98 3.4       NuStep   Level '3 3 4     '$ SPM 21 35 51     Minutes '20 20 20     '$ METs 3.1 3.57 3.59       Home Exercise Plan   Plans to continue exercise at Home (comment) Home (comment) Home (comment)     Frequency Add 2 additional days to program exercise sessions. Add 2 additional days to program exercise sessions. Add 2 additional days to program exercise sessions.        Exercise Comments:      Exercise Comments    Row Name 12/24/16 1142 01/06/17 1245 01/23/17 1502       Exercise Comments Patient is doing very well in CR rehab Patient is doing well in CR.  Patient is progressing very well in CR.         Exercise Goals and  Review:      Exercise Goals    Row Name 12/11/16 1228             Exercise Goals   Increase Physical Activity Yes       Intervention Provide advice, education, support and counseling about physical activity/exercise needs.;Develop an individualized exercise prescription for aerobic and resistive training based on initial evaluation findings, risk stratification, comorbidities and participant's personal goals.       Expected Outcomes Achievement of increased cardiorespiratory fitness and enhanced flexibility, muscular endurance and strength shown through measurements of functional capacity and personal statement of participant.       Increase Strength and Stamina Yes       Intervention Provide advice, education, support and counseling about physical activity/exercise needs.;Develop an individualized exercise prescription for aerobic and resistive training based on initial evaluation findings, risk stratification, comorbidities and participant's personal goals.  Expected Outcomes Achievement of increased cardiorespiratory fitness and enhanced flexibility, muscular endurance and strength shown through measurements of functional capacity and personal statement of participant.          Exercise Goals Re-Evaluation :     Exercise Goals Re-Evaluation    Row Name 01/07/17 1440 01/26/17 1317           Exercise Goal Re-Evaluation   Exercise Goals Review Increase Physical Activity;Increase Strenth and Stamina  Get back to doing ADL's.  Increase Physical Activity;Increase Strenth and Stamina  Get stronger; get back to doing ADL's.      Comments After 12 sessions, patient has progressed well with increased strength, stamina, and activity. He says he is feeling stronger and is doing more things around the house.  After completing 20 sessions, patient reports he is getting stronger. He is able to do his ADL's and able to do some work now. He is feeling better overall.      Expected Outcomes  Patient will complete the program with continued increased strength, stamina, and activity. Patient will complete the program with continued increased strength, stamina, and activity.          Discharge Exercise Prescription (Final Exercise Prescription Changes):     Exercise Prescription Changes - 01/23/17 1500      Response to Exercise   Blood Pressure (Admit) 146/80   Blood Pressure (Exercise) 144/78   Blood Pressure (Exit) 118/70   Heart Rate (Admit) 64 bpm   Heart Rate (Exercise) 97 bpm   Heart Rate (Exit) 73 bpm   Rating of Perceived Exertion (Exercise) 10   Duration Progress to 30 minutes of  aerobic without signs/symptoms of physical distress   Intensity THRR unchanged     Progression   Progression Continue to progress workloads to maintain intensity without signs/symptoms of physical distress.     Resistance Training   Training Prescription Yes   Weight 4   Reps 10-15     Treadmill   MPH 2.9   Grade 0.5   Minutes 15   METs 3.4     NuStep   Level 4   SPM 51   Minutes 20   METs 3.59     Home Exercise Plan   Plans to continue exercise at Home (comment)   Frequency Add 2 additional days to program exercise sessions.      Nutrition:  Target Goals: Understanding of nutrition guidelines, daily intake of sodium '1500mg'$ , cholesterol '200mg'$ , calories 30% from fat and 7% or less from saturated fats, daily to have 5 or more servings of fruits and vegetables.  Biometrics:     Pre Biometrics - 12/11/16 1205      Pre Biometrics   Height '5\' 9"'$  (1.753 m)   Weight 178 lb 12.7 oz (81.1 kg)   Waist Circumference 37 inches   Hip Circumference 38 inches   Waist to Hip Ratio 0.97 %   BMI (Calculated) 26.5   Triceps Skinfold 11 mm   % Body Fat 24.2 %   Grip Strength 67.67 kg   Flexibility 0 in   Single Leg Stand 5 seconds       Nutrition Therapy Plan and Nutrition Goals:   Nutrition Discharge: Rate Your Plate Scores:     Nutrition Assessments - 12/11/16  1230      MEDFICTS Scores   Pre Score 9      Nutrition Goals Re-Evaluation:   Nutrition Goals Discharge (Final Nutrition Goals Re-Evaluation):   Psychosocial: Target Goals: Acknowledge  presence or absence of significant depression and/or stress, maximize coping skills, provide positive support system. Participant is able to verbalize types and ability to use techniques and skills needed for reducing stress and depression.  Initial Review & Psychosocial Screening:     Initial Psych Review & Screening - 12/11/16 1233      Initial Review   Current issues with None Identified     Family Dynamics   Good Support System? Yes     Barriers   Psychosocial barriers to participate in program There are no identifiable barriers or psychosocial needs.     Screening Interventions   Interventions Encouraged to exercise      Quality of Life Scores:     Quality of Life - 12/11/16 1205      Quality of Life Scores   Health/Function Pre 24.8 %   Socioeconomic Pre 23.25 %   Psych/Spiritual Pre 24.86 %   Family Pre 26.4 %   GLOBAL Pre 24.69 %      PHQ-9: Recent Review Flowsheet Data    Depression screen [facility] 2/9 12/11/2016   Decreased Interest 0   PHQ - 2 Score 0   Altered sleeping 0   Tired, decreased energy 0   Change in appetite 0   Feeling bad or failure about yourself  0   Trouble concentrating 0   Moving slowly or fidgety/restless 0   Suicidal thoughts 0   PHQ-9 Score 0   Difficult doing work/chores Somewhat difficult     Interpretation of Total Score  Total Score Depression Severity:  1-4 = Minimal depression, 5-9 = Mild depression, 10-14 = Moderate depression, 15-19 = Moderately severe depression, 20-27 = Severe depression   Psychosocial Evaluation and Intervention:     Psychosocial Evaluation - 12/11/16 1233      Psychosocial Evaluation & Interventions   Interventions Encouraged to exercise with the program and follow exercise prescription   Continue  Psychosocial Services  No Follow up required      Psychosocial Re-Evaluation:     Psychosocial Re-Evaluation    [place] Name 01/07/17 1442 01/26/17 1319           Psychosocial Re-Evaluation   Current issues with None Identified None Identified      Comments Patient's QOL score is 24.69 and his PHQ-9 score is 9. No psychosocial barriers identified.   -      Expected Outcomes Patient will have no psychosocial barriers identified at discharge.   -      Interventions Encouraged to attend Cardiac Rehabilitation for the exercise Encouraged to attend Cardiac Rehabilitation for the exercise      Continue Psychosocial Services  No Follow up required No Follow up required         Psychosocial Discharge (Final Psychosocial Re-Evaluation):     Psychosocial Re-Evaluation - 01/26/17 1319      Psychosocial Re-Evaluation   Current issues with None Identified   Interventions Encouraged to attend Cardiac Rehabilitation for the exercise   Continue Psychosocial Services  No Follow up required      Vocational Rehabilitation: Provide vocational rehab assistance to qualifying candidates.   Vocational Rehab Evaluation & Intervention:     Vocational Rehab - 12/11/16 1221      Initial Vocational Rehab Evaluation & Intervention   Assessment shows need for Vocational Rehabilitation No  Self employed      Education: Education Goals: Education classes will be provided on a weekly basis, covering required topics. Participant will state understanding/return demonstration of topics  presented.  Learning Barriers/Preferences:     Learning Barriers/Preferences - 12/11/16 1220      Learning Barriers/Preferences   Learning Barriers None   Learning Preferences Skilled Demonstration;Individual Instruction;Group Instruction      Education Topics: Hypertension, Hypertension Reduction -Define heart disease and high blood pressure. Discus how high blood pressure affects the body and ways to reduce  high blood pressure.   Exercise and Your Heart -Discuss why it is important to exercise, the FITT principles of exercise, normal and abnormal responses to exercise, and how to exercise safely.   Angina -Discuss definition of angina, causes of angina, treatment of angina, and how to decrease risk of having angina.   Cardiac Medications -Review what the following cardiac medications are used for, how they affect the body, and side effects that may occur when taking the medications.  Medications include Aspirin, Beta blockers, calcium channel blockers, ACE Inhibitors, angiotensin receptor blockers, diuretics, digoxin, and antihyperlipidemics.   Congestive Heart Failure -Discuss the definition of CHF, how to live with CHF, the signs and symptoms of CHF, and how keep track of weight and sodium intake.   Heart Disease and Intimacy -Discus the effect sexual activity has on the heart, how changes occur during intimacy as we age, and safety during sexual activity.   Smoking Cessation / COPD -Discuss different methods to quit smoking, the health benefits of quitting smoking, and the definition of COPD.   CARDIAC REHAB PHASE II EXERCISE from 01/21/2017 in [facility]  Date  12/17/16  Educator  DC  Instruction Review Code  2- meets goals/outcomes      Nutrition I: Fats -Discuss the types of cholesterol, what cholesterol does to the heart, and how cholesterol levels can be controlled.   CARDIAC REHAB PHASE II EXERCISE from 01/21/2017 in [facility]  Date  12/24/16  Educator  DC  Instruction Review Code  2- meets goals/outcomes      Nutrition II: Labels -Discuss the different components of food labels and how to read food label   CARDIAC REHAB PHASE II EXERCISE from 01/21/2017 in [facility]  Date  12/31/16  Educator  Dc  Instruction Review Code  2- meets goals/outcomes      Heart Parts and Heart Disease -Discuss the  anatomy of the heart, the pathway of blood circulation through the heart, and these are affected by heart disease.   CARDIAC REHAB PHASE II EXERCISE from 01/21/2017 in [facility]  Date  01/07/17  Educator  DJ  Instruction Review Code  2- meets goals/outcomes      Stress I: Signs and Symptoms -Discuss the causes of stress, how stress may lead to anxiety and depression, and ways to limit stress.   CARDIAC REHAB PHASE II EXERCISE from 01/21/2017 in [facility]  Date  01/14/17  Educator  D. Coad  Instruction Review Code  2- meets goals/outcomes      Stress II: Relaxation -Discuss different types of relaxation techniques to limit stress.   CARDIAC REHAB PHASE II EXERCISE from 01/21/2017 in [facility]  Date  01/21/17  Educator  DJ  Instruction Review Code  2- meets goals/outcomes      Warning Signs of Stroke / TIA -Discuss definition of a stroke, what the signs and symptoms are of a stroke, and how to identify when someone is having stroke.   Knowledge Questionnaire Score:     Knowledge Questionnaire Score - 12/11/16 1221  Knowledge Questionnaire Score   Pre Score 17/24      Core Components/Risk Factors/Patient Goals at Admission:     Personal Goals and Risk Factors at Admission - 12/11/16 1231      Core Components/Risk Factors/Patient Goals on Admission    Weight Management Weight Maintenance   Tobacco Cessation --  Quit 1980   Personal Goal Other Yes   Personal Goal Get stronger and to get back to normal ADL's   Intervention Attend CR 3 x week and supplement home exercise 2 x week.   Expected Outcomes Achieve personal goals      Core Components/Risk Factors/Patient Goals Review:      Goals and Risk Factor Review    Row Name 12/11/16 1232 01/07/17 1439 01/26/17 1316         Core Components/Risk Factors/Patient Goals Review   Personal Goals Review Weight Management/Obesity  Get stronger  and get back to normal ADL's Weight Management/Obesity  -     Review  - Patient has completed 12 sessions gaining 6 lbs. He plans to attend class with dietitician 01/08/17. Will continue to monitor.  Patient has completed 20 sessions gaining 13.7 lbs. He is progressing well in the program.     Expected Outcomes  - Patient will complete the program and start to lose weight.  Patient will complete the program meeting his personal goals.         Core Components/Risk Factors/Patient Goals at Discharge (Final Review):      Goals and Risk Factor Review - 01/26/17 1316      Core Components/Risk Factors/Patient Goals Review   Review Patient has completed 20 sessions gaining 13.7 lbs. He is progressing well in the program.   Expected Outcomes Patient will complete the program meeting his personal goals.       ITP Comments:     ITP Comments    Row Name 01/08/17 1538           ITP Comments Patient met with Registered Dietitian to discuss nutrition topics including: Heart healthty eating, heart health cooking and make smart choices when shopping; Portion control; weight management; and hydration. Patient attended a group session with the hospital chaplian called [facility] to discuss and share how this recent diagnosis has effected their life          Comments: ITP 30 Day REVIEW Patient is doing well in the program. Will continue to monitor for progress.

## 2017-01-26 NOTE — Progress Notes (Signed)
Daily Session Note  Patient Details  Name: Scott Jacobson MRN: [id number] Date of Birth: 04-May-1951 Referring Provider:     [place] from 12/11/2016 in [facility]  Referring Provider  Dr. Angelena Form      Encounter Date: 01/26/2017  Check In:     Session Check In - 01/26/17 0834      Check-In   Location [facility]   Staff Present Aundra Dubin, RN, BSN;Liberti Appleton Luther Parody, BS, EP, Exercise Physiologist   Supervising physician immediately available to respond to emergencies See telemetry face sheet for immediately available MD   Medication changes reported     No   Fall or balance concerns reported    No   Warm-up and Cool-down Performed as group-led instruction   Resistance Training Performed Yes   VAD Patient? No     Pain Assessment   Currently in Pain? No/denies   Pain Score 0-No pain   Multiple Pain Sites No      Capillary Blood Glucose: No results found for this or any previous visit (from the past 24 hour(s)).    History  Smoking Status  . Former Smoker  . Packs/day: 3.00  . Years: 16.00  . Types: Cigarettes  . Quit date: 02/04/1979  Smokeless Tobacco  . Never Used    Goals Met:  Independence with exercise equipment Exercise tolerated well No report of cardiac concerns or symptoms Strength training completed today  Goals Unmet:  Not Applicable  Comments: Check out [phone]   Dr. Kate Sable is Medical Director for [facility] and Pulmonary Rehab.

## 2017-01-27 DIAGNOSIS — Z136 Encounter for screening for cardiovascular disorders: Secondary | ICD-10-CM | POA: Diagnosis not present

## 2017-01-27 DIAGNOSIS — Z87891 Personal history of nicotine dependence: Secondary | ICD-10-CM | POA: Diagnosis not present

## 2017-01-28 ENCOUNTER — Encounter (HOSPITAL_COMMUNITY)
Admission: RE | Admit: 2017-01-28 | Discharge: 2017-01-28 | Disposition: A | Discharge status: Home / Self Care | Payer: Medicare Other | Source: Ambulatory Visit | Attending: Cardiovascular Disease | Admitting: Cardiovascular Disease

## 2017-01-28 DIAGNOSIS — Z951 Presence of aortocoronary bypass graft: Secondary | ICD-10-CM | POA: Diagnosis not present

## 2017-01-28 DIAGNOSIS — I2510 Atherosclerotic heart disease of native coronary artery without angina pectoris: Secondary | ICD-10-CM | POA: Diagnosis not present

## 2017-01-28 NOTE — Progress Notes (Signed)
Daily Session Note  Patient Details  Name: Scott Jacobson MRN: [id number] Date of Birth: 01-17-1951 Referring Provider:     CARDIAC REHAB PHASE II ORIENTATION from 12/11/2016 in [place]  Referring Provider  Dr. Angelena Form      Encounter Date: 01/28/2017  Check In:     Session Check In - 01/28/17 0820      Check-In   Location [facility]   Staff Present Aundra Dubin, RN, BSN;Dereon Williamsen Luther Parody, BS, EP, Exercise Physiologist   Supervising physician immediately available to respond to emergencies See telemetry face sheet for immediately available MD   Medication changes reported     No   Fall or balance concerns reported    No   Warm-up and Cool-down Performed as group-led instruction   Resistance Training Performed Yes   VAD Patient? No     Pain Assessment   Currently in Pain? No/denies   Pain Score 0-No pain   Multiple Pain Sites No      Capillary Blood Glucose: No results found for this or any previous visit (from the past 24 hour(s)).    History  Smoking Status  . Former Smoker  . Packs/day: 3.00  . Years: 16.00  . Types: Cigarettes  . Quit date: 02/04/1979  Smokeless Tobacco  . Never Used    Goals Met:  Independence with exercise equipment Exercise tolerated well No report of cardiac concerns or symptoms Strength training completed today  Goals Unmet:  Not Applicable  Comments: Check out 915   Dr. Kate Sable is Medical Director for [facility] and Pulmonary Rehab.

## 2017-01-30 ENCOUNTER — Encounter (HOSPITAL_COMMUNITY)
Admission: RE | Admit: 2017-01-30 | Discharge: 2017-01-30 | Disposition: A | Discharge status: Home / Self Care | Payer: Medicare Other | Source: Ambulatory Visit | Attending: Cardiovascular Disease | Admitting: Cardiovascular Disease

## 2017-01-30 DIAGNOSIS — Z951 Presence of aortocoronary bypass graft: Secondary | ICD-10-CM

## 2017-01-30 DIAGNOSIS — I2510 Atherosclerotic heart disease of native coronary artery without angina pectoris: Secondary | ICD-10-CM | POA: Diagnosis not present

## 2017-01-30 NOTE — Progress Notes (Signed)
Daily Session Note  Patient Details  Name: Scott Jacobson MRN: [id number] Date of Birth: [birth year] Referring Provider:     [place] from 12/11/2016 in [place]  Referring Provider  Dr. Angelena Form      Encounter Date: 01/30/2017  Check In:     Session Check In - 01/30/17 0809      Check-In   Location [facility]   Staff Present Russella Dar, MS, EP, [facility], Exercise Physiologist;Harlee Pursifull Luther Parody, BS, EP, Exercise Physiologist   Supervising physician immediately available to respond to emergencies See telemetry face sheet for immediately available MD   Medication changes reported     No   Fall or balance concerns reported    No   Warm-up and Cool-down Performed as group-led instruction   Resistance Training Performed Yes   VAD Patient? No     Pain Assessment   Currently in Pain? No/denies   Pain Score 0-No pain   Multiple Pain Sites No      Capillary Blood Glucose: No results found for this or any previous visit (from the past 24 hour(s)).    History  Smoking Status  . Former Smoker  . Packs/day: 3.00  . Years: 16.00  . Types: Cigarettes  . Quit date: 02/04/1979  Smokeless Tobacco  . Never Used    Goals Met:  Independence with exercise equipment Exercise tolerated well No report of cardiac concerns or symptoms Strength training completed today  Goals Unmet:  Not Applicable  Comments: Check out 915   Dr. Kate Sable is Medical Director for [place] and Pulmonary Rehab.

## 2017-02-02 ENCOUNTER — Encounter (HOSPITAL_COMMUNITY)
Admission: RE | Admit: 2017-02-02 | Discharge: 2017-02-02 | Disposition: A | Discharge status: Home / Self Care | Payer: Medicare Other | Source: Ambulatory Visit | Attending: Cardiovascular Disease | Admitting: Cardiovascular Disease

## 2017-02-02 DIAGNOSIS — I2510 Atherosclerotic heart disease of native coronary artery without angina pectoris: Secondary | ICD-10-CM | POA: Diagnosis not present

## 2017-02-02 DIAGNOSIS — Z951 Presence of aortocoronary bypass graft: Secondary | ICD-10-CM | POA: Diagnosis not present

## 2017-02-02 NOTE — Progress Notes (Signed)
Daily Session Note  Patient Details  Name: Scott Jacobson MRN: [id number] Date of Birth: [birth year] Referring Provider:     [place] from 12/11/2016 in [place]  Referring Provider  Dr. Angelena Form      Encounter Date: 02/02/2017  Check In:     Session Check In - 02/02/17 0823      Check-In   Location [facility]   Staff Present Russella Dar, MS, EP, [facility], Exercise Physiologist;Enes Wegener Luther Parody, BS, EP, Exercise Physiologist   Supervising physician immediately available to respond to emergencies See telemetry face sheet for immediately available MD   Medication changes reported     No   Fall or balance concerns reported    No   Warm-up and Cool-down Performed as group-led instruction   Resistance Training Performed Yes   VAD Patient? No     Pain Assessment   Currently in Pain? No/denies   Pain Score 0-No pain   Multiple Pain Sites No      Capillary Blood Glucose: No results found for this or any previous visit (from the past 24 hour(s)).    History  Smoking Status  . Former Smoker  . Packs/day: 3.00  . Years: 16.00  . Types: Cigarettes  . Quit date: 02/04/1979  Smokeless Tobacco  . Never Used    Goals Met:  Independence with exercise equipment Exercise tolerated well No report of cardiac concerns or symptoms Strength training completed today  Goals Unmet:  Not Applicable  Comments: Check out 915   Dr. Kate Sable is Medical Director for [place] and Pulmonary Rehab.

## 2017-02-04 ENCOUNTER — Encounter (HOSPITAL_COMMUNITY)
Admission: RE | Admit: 2017-02-04 | Discharge: 2017-02-04 | Disposition: A | Discharge status: Home / Self Care | Payer: Medicare Other | Source: Ambulatory Visit | Attending: Cardiovascular Disease | Admitting: Cardiovascular Disease

## 2017-02-04 DIAGNOSIS — Z951 Presence of aortocoronary bypass graft: Secondary | ICD-10-CM | POA: Diagnosis not present

## 2017-02-04 DIAGNOSIS — I2510 Atherosclerotic heart disease of native coronary artery without angina pectoris: Secondary | ICD-10-CM | POA: Insufficient documentation

## 2017-02-04 NOTE — Progress Notes (Signed)
Daily Session Note  Patient Details  Name: Scott Jacobson MRN: [id number] Date of Birth: [birth year] Referring Provider:     CARDIAC REHAB PHASE II ORIENTATION from 12/11/2016 in [place]  Referring Provider  Dr. Angelena Form      Encounter Date: 02/04/2017  Check In:     Session Check In - 02/04/17 0815      Check-In   Location [facility]   Staff Present Russella Dar, MS, EP, [facility], Exercise Physiologist;Debra Wynetta Emery, RN, BSN   Supervising physician immediately available to respond to emergencies See telemetry face sheet for immediately available MD   Medication changes reported     No   Fall or balance concerns reported    No   Warm-up and Cool-down Performed as group-led instruction   Resistance Training Performed Yes   VAD Patient? No     Pain Assessment   Currently in Pain? No/denies   Pain Score 0-No pain   Multiple Pain Sites No      Capillary Blood Glucose: No results found for this or any previous visit (from the past 24 hour(s)).    History  Smoking Status  . Former Smoker  . Packs/day: 3.00  . Years: 16.00  . Types: Cigarettes  . Quit date: 02/04/1979  Smokeless Tobacco  . Never Used    Goals Met:  Independence with exercise equipment Improved SOB with ADL's Exercise tolerated well No report of cardiac concerns or symptoms Strength training completed today  Goals Unmet:  Not Applicable  Comments: Check out: 9:15    Dr. Kate Sable is Medical Director for [name] and Pulmonary Rehab.

## 2017-02-06 ENCOUNTER — Encounter (HOSPITAL_COMMUNITY)
Admission: RE | Admit: 2017-02-06 | Discharge: 2017-02-06 | Disposition: A | Discharge status: Home / Self Care | Payer: Medicare Other | Source: Ambulatory Visit | Attending: Cardiovascular Disease | Admitting: Cardiovascular Disease

## 2017-02-06 DIAGNOSIS — Z951 Presence of aortocoronary bypass graft: Secondary | ICD-10-CM | POA: Diagnosis not present

## 2017-02-06 DIAGNOSIS — I2510 Atherosclerotic heart disease of native coronary artery without angina pectoris: Secondary | ICD-10-CM | POA: Diagnosis not present

## 2017-02-06 DIAGNOSIS — I2119 ST elevation (STEMI) myocardial infarction involving other coronary artery of inferior wall: Secondary | ICD-10-CM

## 2017-02-06 NOTE — Progress Notes (Signed)
Daily Session Note  Patient Details  Name: Scott Jacobson MRN: [id number] Date of Birth: 12-27-1950 Referring Provider:     [place] from 12/11/2016 in [place]  Referring Provider  Dr. Angelena Form      Encounter Date: 02/06/2017  Check In:     Session Check In - 02/06/17 0815      Check-In   Location [facility]   Staff Present Russella Dar, MS, EP, [facility], Exercise Physiologist;Ashlynd Michna Wynetta Emery, RN, BSN   Supervising physician immediately available to respond to emergencies See telemetry face sheet for immediately available MD   Medication changes reported     No   Fall or balance concerns reported    No   Warm-up and Cool-down Performed as group-led instruction   Resistance Training Performed Yes   VAD Patient? No     Pain Assessment   Currently in Pain? No/denies   Pain Score 0-No pain   Multiple Pain Sites No      Capillary Blood Glucose: No results found for this or any previous visit (from the past 24 hour(s)).    History  Smoking Status  . Former Smoker  . Packs/day: 3.00  . Years: 16.00  . Types: Cigarettes  . Quit date: 02/04/1979  Smokeless Tobacco  . Never Used    Goals Met:  Independence with exercise equipment Exercise tolerated well No report of cardiac concerns or symptoms Strength training completed today  Goals Unmet:  Not Applicable  Comments: Check out 915.   Dr. Kate Sable is Medical Director for [facility] Cardiac and Pulmonary Rehab.

## 2017-02-09 ENCOUNTER — Encounter (HOSPITAL_COMMUNITY)
Admission: RE | Admit: 2017-02-09 | Discharge: 2017-02-09 | Disposition: A | Discharge status: Home / Self Care | Payer: Medicare Other | Source: Ambulatory Visit | Attending: Cardiovascular Disease | Admitting: Cardiovascular Disease

## 2017-02-09 DIAGNOSIS — I2510 Atherosclerotic heart disease of native coronary artery without angina pectoris: Secondary | ICD-10-CM | POA: Diagnosis not present

## 2017-02-09 DIAGNOSIS — Z951 Presence of aortocoronary bypass graft: Secondary | ICD-10-CM | POA: Diagnosis not present

## 2017-02-09 DIAGNOSIS — I2119 ST elevation (STEMI) myocardial infarction involving other coronary artery of inferior wall: Secondary | ICD-10-CM

## 2017-02-09 NOTE — Progress Notes (Signed)
Daily Session Note  Patient Details  Name: Scott Jacobson MRN: [id number] Date of Birth: [birth year] Referring Provider:     [place] from 12/11/2016 in [place]  Referring Provider  Dr. Angelena Form      Encounter Date: 02/09/2017  Check In:     Session Check In - 02/09/17 0815      Check-In   Location [facility]   Staff Present Russella Dar, MS, EP, [facility], Exercise Physiologist;Lorien Shingler Wynetta Emery, RN, BSN   Supervising physician immediately available to respond to emergencies See telemetry face sheet for immediately available MD   Medication changes reported     No   Fall or balance concerns reported    No   Warm-up and Cool-down Performed as group-led instruction   Resistance Training Performed Yes   VAD Patient? No     Pain Assessment   Currently in Pain? No/denies   Pain Score 0-No pain   Multiple Pain Sites No      Capillary Blood Glucose: No results found for this or any previous visit (from the past 24 hour(s)).    History  Smoking Status  . Former Smoker  . Packs/day: 3.00  . Years: 16.00  . Types: Cigarettes  . Quit date: 02/04/1979  Smokeless Tobacco  . Never Used    Goals Met:  Independence with exercise equipment Exercise tolerated well No report of cardiac concerns or symptoms Strength training completed today  Goals Unmet:  Not Applicable  Comments: Check out 915.   Dr. Kate Sable is Medical Director for [facility] Cardiac and Pulmonary Rehab.

## 2017-02-11 ENCOUNTER — Encounter (HOSPITAL_COMMUNITY)
Admission: RE | Admit: 2017-02-11 | Discharge: 2017-02-11 | Disposition: A | Discharge status: Home / Self Care | Payer: Medicare Other | Source: Ambulatory Visit | Attending: Cardiovascular Disease | Admitting: Cardiovascular Disease

## 2017-02-11 DIAGNOSIS — I2510 Atherosclerotic heart disease of native coronary artery without angina pectoris: Secondary | ICD-10-CM | POA: Diagnosis not present

## 2017-02-11 DIAGNOSIS — Z951 Presence of aortocoronary bypass graft: Secondary | ICD-10-CM

## 2017-02-11 NOTE — Progress Notes (Signed)
Daily Session Note  Patient Details  Name: Scott Jacobson MRN: [id number] Date of Birth: 08/18/1951 Referring Provider:     CARDIAC REHAB PHASE II ORIENTATION from 12/11/2016 in [place] [facility]  Referring Provider  Dr. Mcalhany      Encounter Date: 02/11/2017  Check In:     Session Check In - 02/11/17 0840      Check-In   Location [facility]   Staff Present Diane Coad, MS, EP, [facility], Exercise Physiologist;Debra Johnson, RN, BSN;Kashara Blocher, BS, EP, Exercise Physiologist   Supervising physician immediately available to respond to emergencies See telemetry face sheet for immediately available MD   Medication changes reported     No   Fall or balance concerns reported    No   Warm-up and Cool-down Performed as group-led instruction   Resistance Training Performed Yes   VAD Patient? No     Pain Assessment   Currently in Pain? No/denies   Pain Score 0-No pain   Multiple Pain Sites No      Capillary Blood Glucose: No results found for this or any previous visit (from the past 24 hour(s)).    History  Smoking Status  . Former Smoker  . Packs/day: 3.00  . Years: 16.00  . Types: Cigarettes  . Quit date: 02/04/1979  Smokeless Tobacco  . Never Used    Goals Met:  Independence with exercise equipment Exercise tolerated well No report of cardiac concerns or symptoms Strength training completed today  Goals Unmet:  Not Applicable  Comments: Check out 915   Dr. Suresh Koneswaran is Medical Director for [place] Cardiac and Pulmonary Rehab. 

## 2017-02-13 ENCOUNTER — Encounter (HOSPITAL_COMMUNITY)
Admission: RE | Admit: 2017-02-13 | Discharge: 2017-02-13 | Disposition: A | Discharge status: Home / Self Care | Payer: Medicare Other | Source: Ambulatory Visit | Attending: Cardiovascular Disease | Admitting: Cardiovascular Disease

## 2017-02-13 DIAGNOSIS — Z951 Presence of aortocoronary bypass graft: Secondary | ICD-10-CM

## 2017-02-13 DIAGNOSIS — I2510 Atherosclerotic heart disease of native coronary artery without angina pectoris: Secondary | ICD-10-CM | POA: Diagnosis not present

## 2017-02-13 NOTE — Progress Notes (Signed)
Daily Session Note  Patient Details  Name: Scott Jacobson MRN: [id number] Date of Birth: Dec 03, 1950 Referring Provider:     [place] from 12/11/2016 in [place]  Referring Provider  Dr. Angelena Form      Encounter Date: 02/13/2017  Check In:     Session Check In - 02/13/17 0839      Check-In   Location [facility]   Staff Present Aundra Dubin, RN, BSN;Obryan Radu Luther Parody, BS, EP, Exercise Physiologist   Supervising physician immediately available to respond to emergencies See telemetry face sheet for immediately available MD   Medication changes reported     No   Fall or balance concerns reported    No   Warm-up and Cool-down Performed as group-led instruction   Resistance Training Performed Yes   VAD Patient? No     Pain Assessment   Currently in Pain? No/denies   Pain Score 0-No pain   Multiple Pain Sites No      Capillary Blood Glucose: No results found for this or any previous visit (from the past 24 hour(s)).    History  Smoking Status  . Former Smoker  . Packs/day: 3.00  . Years: 16.00  . Types: Cigarettes  . Quit date: 02/04/1979  Smokeless Tobacco  . Never Used    Goals Met:  Independence with exercise equipment Exercise tolerated well No report of cardiac concerns or symptoms Strength training completed today  Goals Unmet:  Not Applicable  Comments: Check out 915   Dr. Kate Sable is Medical Director for [place] and Pulmonary Rehab.

## 2017-02-16 ENCOUNTER — Encounter (HOSPITAL_COMMUNITY)
Admission: RE | Admit: 2017-02-16 | Discharge: 2017-02-16 | Disposition: A | Discharge status: Home / Self Care | Payer: Medicare Other | Source: Ambulatory Visit | Attending: Cardiovascular Disease | Admitting: Cardiovascular Disease

## 2017-02-16 DIAGNOSIS — I2510 Atherosclerotic heart disease of native coronary artery without angina pectoris: Secondary | ICD-10-CM | POA: Diagnosis not present

## 2017-02-16 DIAGNOSIS — Z951 Presence of aortocoronary bypass graft: Secondary | ICD-10-CM

## 2017-02-16 NOTE — Progress Notes (Signed)
Daily Session Note  Patient Details  Name: BRYSE BLANCHETTE MRN: [id number] Date of Birth: Jan 19, 1951 Referring Provider:     CARDIAC REHAB PHASE II ORIENTATION from 12/11/2016 in [place]  Referring Provider  Dr. Angelena Form      Encounter Date: 02/16/2017  Check In:     Session Check In - 02/16/17 0824      Check-In   Location [facility]   Staff Present Aundra Dubin, RN, BSN;Jaymarion Trombly Luther Parody, BS, EP, Exercise Physiologist   Supervising physician immediately available to respond to emergencies See telemetry face sheet for immediately available MD   Medication changes reported     No   Fall or balance concerns reported    No   Warm-up and Cool-down Performed as group-led instruction   Resistance Training Performed Yes   VAD Patient? No     Pain Assessment   Currently in Pain? No/denies   Pain Score 0-No pain   Multiple Pain Sites No      Capillary Blood Glucose: No results found for this or any previous visit (from the past 24 hour(s)).    History  Smoking Status  . Former Smoker  . Packs/day: 3.00  . Years: 16.00  . Types: Cigarettes  . Quit date: 02/04/1979  Smokeless Tobacco  . Never Used    Goals Met:  Independence with exercise equipment Exercise tolerated well No report of cardiac concerns or symptoms Strength training completed today  Goals Unmet:  Not Applicable  Comments: Check out 915   Dr. Kate Sable is Medical Director for [place] and Pulmonary Rehab.

## 2017-02-18 ENCOUNTER — Encounter (HOSPITAL_COMMUNITY)
Admission: RE | Admit: 2017-02-18 | Discharge: 2017-02-18 | Disposition: A | Discharge status: Home / Self Care | Payer: Medicare Other | Source: Ambulatory Visit | Attending: Cardiovascular Disease | Admitting: Cardiovascular Disease

## 2017-02-18 DIAGNOSIS — I2119 ST elevation (STEMI) myocardial infarction involving other coronary artery of inferior wall: Secondary | ICD-10-CM

## 2017-02-18 DIAGNOSIS — Z951 Presence of aortocoronary bypass graft: Secondary | ICD-10-CM | POA: Diagnosis not present

## 2017-02-18 DIAGNOSIS — I2510 Atherosclerotic heart disease of native coronary artery without angina pectoris: Secondary | ICD-10-CM | POA: Diagnosis not present

## 2017-02-18 NOTE — Progress Notes (Signed)
Daily Session Note  Patient Details  Name: Scott Jacobson MRN: [id number] Date of Birth: 19-Aug-1951 Referring Provider:     [place] from 12/11/2016 in [place]  Referring Provider  Dr. Angelena Form      Encounter Date: 02/18/2017  Check In:     Session Check In - 02/18/17 0841      Check-In   Location [facility]   Staff Present Russella Dar, MS, EP, [facility], Exercise Physiologist;Vaanya Shambaugh Luther Parody, BS, EP, Exercise Physiologist   Supervising physician immediately available to respond to emergencies See telemetry face sheet for immediately available MD   Medication changes reported     No   Fall or balance concerns reported    No   Warm-up and Cool-down Performed as group-led instruction   Resistance Training Performed Yes   VAD Patient? No     Pain Assessment   Currently in Pain? No/denies   Pain Score 0-No pain   Multiple Pain Sites No      Capillary Blood Glucose: No results found for this or any previous visit (from the past 24 hour(s)).    History  Smoking Status  . Former Smoker  . Packs/day: 3.00  . Years: 16.00  . Types: Cigarettes  . Quit date: 02/04/1979  Smokeless Tobacco  . Never Used    Goals Met:  Independence with exercise equipment Exercise tolerated well No report of cardiac concerns or symptoms Strength training completed today  Goals Unmet:  Not Applicable  Comments: Check out 915   Dr. Kate Sable is Medical Director for [place] and Pulmonary Rehab.

## 2017-02-19 NOTE — Progress Notes (Signed)
Cardiac Individual Treatment Plan  Patient Details  Name: Scott Jacobson MRN: [id number] Date of Birth: July 21, 1951 Referring Provider:     CARDIAC REHAB PHASE II ORIENTATION from 12/11/2016 in [place]  Referring Provider  Dr. Angelena Form      Initial Encounter Date:    CARDIAC REHAB PHASE II ORIENTATION from 12/11/2016 in [place]  Date  12/11/16  Referring Provider  Dr. Angelena Form      Visit Diagnosis: S/P CABG x 4  ST elevation myocardial infarction (STEMI) involving other coronary artery of inferior wall ([place])  Patient's Home Medications on Admission:  Current Outpatient Prescriptions:  .  aspirin EC 81 MG tablet, Take 1 tablet (81 mg total) by mouth daily., Disp: , Rfl:  .  atorvastatin (LIPITOR) 80 MG tablet, Take 1 tablet (80 mg total) by mouth daily at 6 PM., Disp: 90 tablet, Rfl: 2 .  cetirizine (ZYRTEC) 10 MG tablet, Take 10 mg by mouth daily at 12 noon. , Disp: , Rfl:  .  clopidogrel (PLAVIX) 75 MG tablet, Take 1 tablet (75 mg total) by mouth daily., Disp: 30 tablet, Rfl: 1 .  metoprolol tartrate (LOPRESSOR) 25 MG tablet, Take 1 tablet (25 mg total) by mouth 2 (two) times daily., Disp: 60 tablet, Rfl: 11 .  Multiple Vitamins-Minerals (ICAPS MV PO), Take 1 tablet by mouth 2 (two) times daily. , Disp: , Rfl:  .  pantoprazole (PROTONIX) 40 MG tablet, Take 40 mg by mouth daily., Disp: , Rfl:  .  Polyvinyl Alcohol-Povidone (REFRESH OP), Apply 1 drop to eye 4 (four) times daily as needed (dry eyes)., Disp: , Rfl:  .  ramipril (ALTACE) 5 MG capsule, Take 5 mg by mouth daily., Disp: , Rfl:  .  tamsulosin (FLOMAX) 0.4 MG CAPS capsule, Take 0.4 mg by mouth daily., Disp: , Rfl:  .  traMADol (ULTRAM) 50 MG tablet, Take 1 tablet (50 mg total) by mouth every 4 (four) hours as needed for moderate pain., Disp: 30 tablet, Rfl: 0  Past Medical History: Past Medical History:  Diagnosis Date  . Anginal pain ([place])   . Arthritis    "probably"  .  BPH (benign prostatic hyperplasia)   . Coronary artery disease   . Dyspnea   . GERD (gastroesophageal reflux disease)   . History of kidney stones   . Hypertension   . Kidney stones   . Macular degeneration   . Myocardial infarction ([place])   . Seasonal allergies     Tobacco Use: History  Smoking Status  . Former Smoker  . Packs/day: 3.00  . Years: 16.00  . Types: Cigarettes  . Quit date: 02/04/1979  Smokeless Tobacco  . Never Used    Labs: Recent Review Flowsheet Data    Labs for ITP Cardiac and Pulmonary Rehab Latest Ref Rng & Units 10/03/2016 10/03/2016 10/03/2016 10/03/2016 10/04/2016   Cholestrol 0 - 200 mg/dL - - - - -   LDLCALC 0 - 99 mg/dL - - - - -   HDL >40 mg/dL - - - - -   Trlycerides <150 mg/dL - - - - -   Hemoglobin A1c 4.8 - 5.6 % - - - - -   PHART 7.350 - 7.450 7.308(L) - 7.349(L) 7.390 -   PCO2ART 32.0 - 48.0 mmHg 43.1 - 39.3 36.0 -   HCO3 20.0 - 28.0 mmol/L 21.6 - 21.6 21.7 -   TCO2 0 - 100 mmol/L '23 23 23 23 23   '$ ACIDBASEDEF 0.0 -  2.0 mmol/L 4.0(H) - 4.0(H) 3.0(H) -   O2SAT % 98.0 - 99.0 99.0 -      Capillary Blood Glucose: Lab Results  Component Value Date   GLUCAP 108 (H) 10/08/2016   GLUCAP 116 (H) 10/07/2016   GLUCAP 113 (H) 10/07/2016   GLUCAP 113 (H) 10/07/2016   GLUCAP 134 (H) 10/06/2016     Exercise Target Goals:    Exercise Program Goal: Individual exercise prescription set with THRR, safety & activity barriers. Participant demonstrates ability to understand and report RPE using BORG scale, to self-measure pulse accurately, and to acknowledge the importance of the exercise prescription.  Exercise Prescription Goal: Starting with aerobic activity 30 plus minutes a day, 3 days per week for initial exercise prescription. Provide home exercise prescription and guidelines that participant acknowledges understanding prior to discharge.  Activity Barriers & Risk Stratification:     Activity Barriers & Cardiac Risk Stratification -  12/11/16 1225      Activity Barriers & Cardiac Risk Stratification   Activity Barriers None   Cardiac Risk Stratification High      6 Minute Walk:     6 Minute Walk    Row Name 12/11/16 1203         6 Minute Walk   Phase Initial     Distance 1500 feet     Distance % Change 0 %     Walk Time 6 minutes     # of Rest Breaks 0     MPH 2.84     METS 3.17     RPE 13     Perceived Dyspnea  13     VO2 Peak 12.2     Symptoms No     Resting HR 63 bpm     Resting BP 142/76     Max Ex. HR 80 bpm     Max Ex. BP 150/84     2 Minute Post BP 140/74        Oxygen Initial Assessment:     Oxygen Initial Assessment - 12/11/16 1224      Home Oxygen   Home Oxygen Device None   Sleep Oxygen Prescription None   Home Exercise Oxygen Prescription None   Home at Rest Exercise Oxygen Prescription None     Initial 6 min Walk   Oxygen Used None     Program Oxygen Prescription   Program Oxygen Prescription None      Oxygen Re-Evaluation:   Oxygen Discharge (Final Oxygen Re-Evaluation):   Initial Exercise Prescription:     Initial Exercise Prescription - 12/11/16 1200      Date of Initial Exercise RX and Referring Provider   Date 12/11/16   Referring Provider Dr. Angelena Form     Treadmill   MPH 1.8   Grade 0   Minutes 15   METs 2.3     NuStep   Level 2   SPM 19   Minutes 20   METs 1.9     Prescription Details   Frequency (times per week) 3   Duration Progress to 30 minutes of continuous aerobic without signs/symptoms of physical distress     Intensity   THRR 40-80% of Max Heartrate [phone]   Ratings of Perceived Exertion 11-13   Perceived Dyspnea 0-4     Progression   Progression Continue progressive overload as per policy without signs/symptoms or physical distress.     Resistance Training   Training Prescription Yes   Weight 1   Reps 10-15  Perform Capillary Blood Glucose checks as needed.  Exercise Prescription Changes:      Exercise  Prescription Changes    Row Name 12/24/16 1100 01/06/17 1200 01/23/17 1500 02/19/17 0700       Response to Exercise   Blood Pressure (Admit) 118/60 134/72 146/80 128/70    Blood Pressure (Exercise) 138/74 146/70 144/78 138/70    Blood Pressure (Exit) 116/62 128/70 118/70 118/60    Heart Rate (Admit) 71 bpm 65 bpm 64 bpm 55 bpm    Heart Rate (Exercise) 99 bpm 94 bpm 97 bpm 105 bpm    Heart Rate (Exit) 79 bpm 96 bpm 73 bpm 65 bpm    Rating of Perceived Exertion (Exercise) '10 10 10 10    '$ Duration Progress to 30 minutes of  aerobic without signs/symptoms of physical distress Progress to 30 minutes of  aerobic without signs/symptoms of physical distress Progress to 30 minutes of  aerobic without signs/symptoms of physical distress Progress to 30 minutes of  aerobic without signs/symptoms of physical distress    Intensity THRR unchanged THRR unchanged THRR unchanged THRR unchanged      Progression   Progression Continue to progress workloads to maintain intensity without signs/symptoms of physical distress. Continue to progress workloads to maintain intensity without signs/symptoms of physical distress. Continue to progress workloads to maintain intensity without signs/symptoms of physical distress. Continue to progress workloads to maintain intensity without signs/symptoms of physical distress.      Resistance Training   Training Prescription Yes Yes Yes Yes    Weight '3 4 4 5    '$ Reps 10-15 10-15 10-15 10-15      Treadmill   MPH 2.3 2.6 2.9 3.1    Grade 0 0 0.5 0.5    Minutes '15 15 15 15    '$ METs 2.7 2.98 3.4 3.56      NuStep   Level '3 3 4 4    '$ SPM 21 35 51 47    Minutes '20 20 20 20    '$ METs 3.1 3.57 3.59 3.6      Home Exercise Plan   Plans to continue exercise at Home (comment) Home (comment) Home (comment) Home (comment)    Frequency Add 2 additional days to program exercise sessions. Add 2 additional days to program exercise sessions. Add 2 additional days to program exercise  sessions. Add 2 additional days to program exercise sessions.       Exercise Comments:      Exercise Comments    Row Name 12/24/16 1142 01/06/17 1245 01/23/17 1502 02/19/17 0741     Exercise Comments Patient is doing very well in CR rehab Patient is doing well in CR.  Patient is progressing very well in CR.  Patient is progressing well in CR.        Exercise Goals and Review:      Exercise Goals    Row Name 12/11/16 1228             Exercise Goals   Increase Physical Activity Yes       Intervention Provide advice, education, support and counseling about physical activity/exercise needs.;Develop an individualized exercise prescription for aerobic and resistive training based on initial evaluation findings, risk stratification, comorbidities and participant's personal goals.       Expected Outcomes Achievement of increased cardiorespiratory fitness and enhanced flexibility, muscular endurance and strength shown through measurements of functional capacity and personal statement of participant.       Increase Strength and Stamina Yes  Intervention Provide advice, education, support and counseling about physical activity/exercise needs.;Develop an individualized exercise prescription for aerobic and resistive training based on initial evaluation findings, risk stratification, comorbidities and participant's personal goals.       Expected Outcomes Achievement of increased cardiorespiratory fitness and enhanced flexibility, muscular endurance and strength shown through measurements of functional capacity and personal statement of participant.          Exercise Goals Re-Evaluation :     Exercise Goals Re-Evaluation    Row Name 01/07/17 1440 01/26/17 1317 02/19/17 1559         Exercise Goal Re-Evaluation   Exercise Goals Review Increase Physical Activity;Increase Strenth and Stamina  Get back to doing ADL's.  Increase Physical Activity;Increase Strenth and Stamina  Get  stronger; get back to doing ADL's. Increase Physical Activity;Increase Strenth and Stamina  Get stronger; get back to doing ADL's.     Comments After 12 sessions, patient has progressed well with increased strength, stamina, and activity. He says he is feeling stronger and is doing more things around the house.  After completing 20 sessions, patient reports he is getting stronger. He is able to do his ADL's and able to do some work now. He is feeling better overall. After completing 30 sessions, patient continues to progress in the program reporting feeling stronger and having more stamina. He is able to do his ADL's and has returned to work. He is doing more around the house.      Expected Outcomes Patient will complete the program with continued increased strength, stamina, and activity. Patient will complete the program with continued increased strength, stamina, and activity. Patient will graduate and continue to exercise with continued increased strength, stamina, and activity.         Discharge Exercise Prescription (Final Exercise Prescription Changes):     Exercise Prescription Changes - 02/19/17 0700      Response to Exercise   Blood Pressure (Admit) 128/70   Blood Pressure (Exercise) 138/70   Blood Pressure (Exit) 118/60   Heart Rate (Admit) 55 bpm   Heart Rate (Exercise) 105 bpm   Heart Rate (Exit) 65 bpm   Rating of Perceived Exertion (Exercise) 10   Duration Progress to 30 minutes of  aerobic without signs/symptoms of physical distress   Intensity THRR unchanged     Progression   Progression Continue to progress workloads to maintain intensity without signs/symptoms of physical distress.     Resistance Training   Training Prescription Yes   Weight 5   Reps 10-15     Treadmill   MPH 3.1   Grade 0.5   Minutes 15   METs 3.56     NuStep   Level 4   SPM 47   Minutes 20   METs 3.6     Home Exercise Plan   Plans to continue exercise at Home (comment)   Frequency  Add 2 additional days to program exercise sessions.      Nutrition:  Target Goals: Understanding of nutrition guidelines, daily intake of sodium '1500mg'$ , cholesterol '200mg'$ , calories 30% from fat and 7% or less from saturated fats, daily to have 5 or more servings of fruits and vegetables.  Biometrics:     Pre Biometrics - 12/11/16 1205      Pre Biometrics   Height '5\' 9"'$  (1.753 m)   Weight 178 lb 12.7 oz (81.1 kg)   Waist Circumference 37 inches   Hip Circumference 38 inches   Waist to Hip Ratio 0.97 %  BMI (Calculated) 26.5   Triceps Skinfold 11 mm   % Body Fat 24.2 %   Grip Strength 67.67 kg   Flexibility 0 in   Single Leg Stand 5 seconds       Nutrition Therapy Plan and Nutrition Goals:   Nutrition Discharge: Rate Your Plate Scores:     Nutrition Assessments - 12/11/16 1230      MEDFICTS Scores   Pre Score 9      Nutrition Goals Re-Evaluation:   Nutrition Goals Discharge (Final Nutrition Goals Re-Evaluation):   Psychosocial: Target Goals: Acknowledge presence or absence of significant depression and/or stress, maximize coping skills, provide positive support system. Participant is able to verbalize types and ability to use techniques and skills needed for reducing stress and depression.  Initial Review & Psychosocial Screening:     Initial Psych Review & Screening - 12/11/16 1233      Initial Review   Current issues with None Identified     Family Dynamics   Good Support System? Yes     Barriers   Psychosocial barriers to participate in program There are no identifiable barriers or psychosocial needs.     Screening Interventions   Interventions Encouraged to exercise      Quality of Life Scores:     Quality of Life - 12/11/16 1205      Quality of Life Scores   Health/Function Pre 24.8 %   Socioeconomic Pre 23.25 %   Psych/Spiritual Pre 24.86 %   Family Pre 26.4 %   GLOBAL Pre 24.69 %      PHQ-9: Recent Review Flowsheet Data     Depression screen [facility] 2/9 12/11/2016   Decreased Interest 0   PHQ - 2 Score 0   Altered sleeping 0   Tired, decreased energy 0   Change in appetite 0   Feeling bad or failure about yourself  0   Trouble concentrating 0   Moving slowly or fidgety/restless 0   Suicidal thoughts 0   PHQ-9 Score 0   Difficult doing work/chores Somewhat difficult     Interpretation of Total Score  Total Score Depression Severity:  1-4 = Minimal depression, 5-9 = Mild depression, 10-14 = Moderate depression, 15-19 = Moderately severe depression, 20-27 = Severe depression   Psychosocial Evaluation and Intervention:     Psychosocial Evaluation - 12/11/16 1233      Psychosocial Evaluation & Interventions   Interventions Encouraged to exercise with the program and follow exercise prescription   Continue Psychosocial Services  No Follow up required      Psychosocial Re-Evaluation:     Psychosocial Re-Evaluation    Bertie Name 01/07/17 1442 01/26/17 1319 02/19/17 1601         Psychosocial Re-Evaluation   Current issues with None Identified None Identified None Identified     Comments Patient's QOL score is 24.69 and his PHQ-9 score is 9. No psychosocial barriers identified.   -  -     Expected Outcomes Patient will have no psychosocial barriers identified at discharge.   - Patient will have no psychosocial issues identified at discharge.      Interventions Encouraged to attend Cardiac Rehabilitation for the exercise Encouraged to attend Cardiac Rehabilitation for the exercise Encouraged to attend Cardiac Rehabilitation for the exercise     Continue Psychosocial Services  No Follow up required No Follow up required No Follow up required        Psychosocial Discharge (Final Psychosocial Re-Evaluation):     Psychosocial  Re-Evaluation - 02/19/17 1601      Psychosocial Re-Evaluation   Current issues with None Identified   Expected Outcomes Patient will have no psychosocial issues identified at  discharge.    Interventions Encouraged to attend Cardiac Rehabilitation for the exercise   Continue Psychosocial Services  No Follow up required      Vocational Rehabilitation: Provide vocational rehab assistance to qualifying candidates.   Vocational Rehab Evaluation & Intervention:     Vocational Rehab - 12/11/16 1221      Initial Vocational Rehab Evaluation & Intervention   Assessment shows need for Vocational Rehabilitation No  Self employed      Education: Education Goals: Education classes will be provided on a weekly basis, covering required topics. Participant will state understanding/return demonstration of topics presented.  Learning Barriers/Preferences:     Learning Barriers/Preferences - 12/11/16 1220      Learning Barriers/Preferences   Learning Barriers None   Learning Preferences Skilled Demonstration;Individual Instruction;Group Instruction      Education Topics: Hypertension, Hypertension Reduction -Define heart disease and high blood pressure. Discus how high blood pressure affects the body and ways to reduce high blood pressure.   CARDIAC REHAB PHASE II EXERCISE from 02/18/2017 in [place] CARDIAC REH[facility]ILITATION  Date  02/04/17  Educator  Hart [place]  Instruction Review Code  2- meets goals/outcomes      Exercise and Your Heart -Discuss why it is important to exercise, the FITT principles of exercise, normal and abnormal responses to exercise, and how to exercise safely.   CARDIAC REHAB PHASE II EXERCISE from 02/18/2017 in [facility]  Date  02/11/17  Educator  DC  Instruction Review Code  2- meets goals/outcomes      Angina -Discuss definition of angina, causes of angina, treatment of angina, and how to decrease risk of having angina.   CARDIAC REHAB PHASE II EXERCISE from 02/18/2017 in [facility]  Date  02/18/17  Educator  DC  Instruction Review Code  2- meets goals/outcomes      Cardiac  Medications -Review what the following cardiac medications are used for, how they affect the body, and side effects that may occur when taking the medications.  Medications include Aspirin, Beta blockers, calcium channel blockers, ACE Inhibitors, angiotensin receptor blockers, diuretics, digoxin, and antihyperlipidemics.   Congestive Heart Failure -Discuss the definition of CHF, how to live with CHF, the signs and symptoms of CHF, and how keep track of weight and sodium intake.   Heart Disease and Intimacy -Discus the effect sexual activity has on the heart, how changes occur during intimacy as we age, and safety during sexual activity.   Smoking Cessation / COPD -Discuss different methods to quit smoking, the health benefits of quitting smoking, and the definition of COPD.   CARDIAC REHAB PHASE II EXERCISE from 02/18/2017 in [place] [facility]  Date  12/17/16  Educator  DC  Instruction Review Code  2- meets goals/outcomes      Nutrition I: Fats -Discuss the types of cholesterol, what cholesterol does to the heart, and how cholesterol levels can be controlled.   CARDIAC REHAB PHASE II EXERCISE from 02/18/2017 in [place] [facility]  Date  12/24/16  Educator  DC  Instruction Review Code  2- meets goals/outcomes      Nutrition II: Labels -Discuss the different components of food labels and how to read food label   CARDIAC REHAB PHASE II EXERCISE from 02/18/2017 in [facility] CARDIAC REHABILIT[facility]  Date  12/31/16  Educator  Dc  Instruction Review Code  2- meets goals/outcomes      Heart Parts and Heart Disease -Discuss the anatomy of the heart, the pathway of blood circulation through the heart, and these are affected by heart disease.   CARDIAC REHAB PHASE II EXERCISE from 02/18/2017 in [place]  Date  01/07/17  Educator  DJ  Instruction Review Code  2- meets goals/outcomes      Stress I: Signs and Symptoms -Discuss the  causes of stress, how stress may lead to anxiety and depression, and ways to limit stress.   CARDIAC REHAB PHASE II EXERCISE from 02/18/2017 in [place]  Date  01/14/17  Educator  D. Coad  Instruction Review Code  2- meets goals/outcomes      Stress II: Relaxation -Discuss different types of relaxation techniques to limit stress.   CARDIAC REHAB PHASE II EXERCISE from 02/18/2017 in [place]  Date  01/21/17  Educator  DJ  Instruction Review Code  2- meets goals/outcomes      Warning Signs of Stroke / TIA -Discuss definition of a stroke, what the signs and symptoms are of a stroke, and how to identify when someone is having stroke.   CARDIAC REHAB PHASE II EXERCISE from 02/18/2017 in [place]  Date  01/28/17  Educator  Dc  Instruction Review Code  2- meets goals/outcomes      Knowledge Questionnaire Score:     Knowledge Questionnaire Score - 12/11/16 1221      Knowledge Questionnaire Score   Pre Score 17/24      Core Components/Risk Factors/Patient Goals at Admission:     Personal Goals and Risk Factors at Admission - 12/11/16 1231      Core Components/Risk Factors/Patient Goals on Admission    Weight Management Weight Maintenance   Tobacco Cessation --  Quit 1980   Personal Goal Other Yes   Personal Goal Get stronger and to get back to normal ADL's   Intervention Attend CR 3 x week and supplement home exercise 2 x week.   Expected Outcomes Achieve personal goals      Core Components/Risk Factors/Patient Goals Review:      Goals and Risk Factor Review    Row Name 12/11/16 1232 01/07/17 1439 01/26/17 1316 02/19/17 1557       Core Components/Risk Factors/Patient Goals Review   Personal Goals Review Weight Management/Obesity  Get stronger and get back to normal ADL's Weight Management/Obesity  - Weight Management/Obesity    Review  - Patient has completed 12 sessions gaining 6 lbs. He  plans to attend class with dietitician 01/08/17. Will continue to monitor.  Patient has completed 20 sessions gaining 13.7 lbs. He is progressing well in the program. Patient has attended 30 sessions gaining 16 lbs. He has done well in the program but says he has continues to eat out alot. He says he feels better overall since he started the program.     Expected Outcomes  - Patient will complete the program and start to lose weight.  Patient will complete the program meeting his personal goals.  Patient will graduate and work toward eating a heart healthy diet.       Core Components/Risk Factors/Patient Goals at Discharge (Final Review):      Goals and Risk Factor Review - 02/19/17 1557      Core Components/Risk Factors/Patient Goals Review   Personal Goals Review Weight Management/Obesity   Review Patient  has attended 30 sessions gaining 16 lbs. He has done well in the program but says he has continues to eat out alot. He says he feels better overall since he started the program.    Expected Outcomes Patient will graduate and work toward eating a heart healthy diet.      ITP Comments:     ITP Comments    Row Name 01/08/17 1538           ITP Comments Patient met with Registered Dietitian to discuss nutrition topics including: Heart healthty eating, heart health cooking and make smart choices when shopping; Portion control; weight management; and hydration. Patient attended a group session with the hospital chaplian called [facility] to discuss and share how this recent diagnosis has effected their life          Comments: ITP 30 Day REVIEW Patient doing well in the program. Will continue to monitor for progress.

## 2017-02-20 ENCOUNTER — Encounter (HOSPITAL_COMMUNITY)
Admission: RE | Admit: 2017-02-20 | Discharge: 2017-02-20 | Disposition: A | Discharge status: Home / Self Care | Payer: Medicare Other | Source: Ambulatory Visit | Attending: Cardiovascular Disease | Admitting: Cardiovascular Disease

## 2017-02-20 DIAGNOSIS — Z951 Presence of aortocoronary bypass graft: Secondary | ICD-10-CM

## 2017-02-20 DIAGNOSIS — I2510 Atherosclerotic heart disease of native coronary artery without angina pectoris: Secondary | ICD-10-CM | POA: Diagnosis not present

## 2017-02-20 NOTE — Progress Notes (Signed)
Daily Session Note  Patient Details  Name: Scott Jacobson MRN: [id number] Date of Birth: [birth year] Referring Provider:     [place] from 12/11/2016 in [place]  Referring Provider  Dr. Angelena Form      Encounter Date: 02/20/2017  Check In:     Session Check In - 02/20/17 0835      Check-In   Location [facility]   Staff Present Russella Dar, MS, EP, [facility], Exercise Physiologist;Kainen Struckman Luther Parody, BS, EP, Exercise Physiologist   Supervising physician immediately available to respond to emergencies See telemetry face sheet for immediately available MD   Medication changes reported     No   Fall or balance concerns reported    No   Warm-up and Cool-down Performed as group-led instruction   Resistance Training Performed Yes   VAD Patient? No     Pain Assessment   Currently in Pain? No/denies   Pain Score 0-No pain   Multiple Pain Sites No      Capillary Blood Glucose: No results found for this or any previous visit (from the past 24 hour(s)).    History  Smoking Status  . Former Smoker  . Packs/day: 3.00  . Years: 16.00  . Types: Cigarettes  . Quit date: 02/04/1979  Smokeless Tobacco  . Never Used    Goals Met:  Independence with exercise equipment Exercise tolerated well No report of cardiac concerns or symptoms Strength training completed today  Goals Unmet:  Not Applicable  Comments: Check out 915   Dr. Kate Sable is Medical Director for [place] and Pulmonary Rehab.

## 2017-02-23 ENCOUNTER — Encounter (HOSPITAL_COMMUNITY)
Admission: RE | Admit: 2017-02-23 | Discharge: 2017-02-23 | Disposition: A | Discharge status: Home / Self Care | Payer: Medicare Other | Source: Ambulatory Visit | Attending: Cardiovascular Disease | Admitting: Cardiovascular Disease

## 2017-02-23 DIAGNOSIS — Z951 Presence of aortocoronary bypass graft: Secondary | ICD-10-CM | POA: Diagnosis not present

## 2017-02-23 DIAGNOSIS — I2510 Atherosclerotic heart disease of native coronary artery without angina pectoris: Secondary | ICD-10-CM | POA: Diagnosis not present

## 2017-02-23 NOTE — Progress Notes (Signed)
Daily Session Note  Patient Details  Name: Scott Jacobson MRN: [id number] Date of Birth: [birth year] Referring Provider:     CARDIAC REHAB PHASE II ORIENTATION from 12/11/2016 in [place]  Referring Provider  Dr. Angelena Form      Encounter Date: 02/23/2017  Check In:     Session Check In - 02/23/17 0823      Check-In   Location [facility]   Staff Present Russella Dar, MS, EP, [facility], Exercise Physiologist;Patience Nuzzo Luther Parody, BS, EP, Exercise Physiologist   Supervising physician immediately available to respond to emergencies See telemetry face sheet for immediately available MD   Medication changes reported     No   Fall or balance concerns reported    No   Warm-up and Cool-down Performed as group-led instruction   Resistance Training Performed Yes   VAD Patient? No     Pain Assessment   Currently in Pain? No/denies   Pain Score 0-No pain   Multiple Pain Sites No      Capillary Blood Glucose: No results found for this or any previous visit (from the past 24 hour(s)).    History  Smoking Status  . Former Smoker  . Packs/day: 3.00  . Years: 16.00  . Types: Cigarettes  . Quit date: 02/04/1979  Smokeless Tobacco  . Never Used    Goals Met:  Independence with exercise equipment Exercise tolerated well No report of cardiac concerns or symptoms Strength training completed today  Goals Unmet:  Not Applicable  Comments: Check out 915   Dr. Kate Sable is Medical Director for [name] and Pulmonary Rehab.

## 2017-02-25 ENCOUNTER — Encounter (HOSPITAL_COMMUNITY)
Admission: RE | Admit: 2017-02-25 | Discharge: 2017-02-25 | Disposition: A | Discharge status: Home / Self Care | Payer: Medicare Other | Source: Ambulatory Visit | Attending: Cardiovascular Disease | Admitting: Cardiovascular Disease

## 2017-02-25 DIAGNOSIS — Z951 Presence of aortocoronary bypass graft: Secondary | ICD-10-CM | POA: Diagnosis not present

## 2017-02-25 DIAGNOSIS — I2510 Atherosclerotic heart disease of native coronary artery without angina pectoris: Secondary | ICD-10-CM | POA: Diagnosis not present

## 2017-02-25 NOTE — Progress Notes (Signed)
Daily Session Note  Patient Details  Name: Scott Jacobson MRN: [id number] Date of Birth: 1950/11/22 Referring Provider:     CARDIAC REHAB PHASE II ORIENTATION from 12/11/2016 in [place]  Referring Provider  Dr. Angelena Form      Encounter Date: 02/25/2017  Check In:     Session Check In - 02/25/17 0815      Check-In   Location [facility]   Staff Present Aundra Dubin, RN, BSN;Diane Coad, MS, EP, [facility], Exercise Physiologist;Dasia Guerrier Luther Parody, BS, EP, Exercise Physiologist   Supervising physician immediately available to respond to emergencies See telemetry face sheet for immediately available MD   Medication changes reported     No   Fall or balance concerns reported    No   Warm-up and Cool-down Performed as group-led instruction   Resistance Training Performed Yes   VAD Patient? No     Pain Assessment   Currently in Pain? No/denies   Pain Score 0-No pain   Multiple Pain Sites No      Capillary Blood Glucose: No results found for this or any previous visit (from the past 24 hour(s)).    History  Smoking Status  . Former Smoker  . Packs/day: 3.00  . Years: 16.00  . Types: Cigarettes  . Quit date: 02/04/1979  Smokeless Tobacco  . Never Used    Goals Met:  Independence with exercise equipment Exercise tolerated well No report of cardiac concerns or symptoms Strength training completed today  Goals Unmet:  Not Applicable  Comments: Check out 915   Dr. Kate Sable is Medical Director for [place] and Pulmonary Rehab.

## 2017-02-27 ENCOUNTER — Encounter (HOSPITAL_COMMUNITY)
Admission: RE | Admit: 2017-02-27 | Discharge: 2017-02-27 | Disposition: A | Discharge status: Home / Self Care | Payer: Medicare Other | Source: Ambulatory Visit | Attending: Cardiovascular Disease | Admitting: Cardiovascular Disease

## 2017-02-27 DIAGNOSIS — Z951 Presence of aortocoronary bypass graft: Secondary | ICD-10-CM

## 2017-02-27 DIAGNOSIS — I2510 Atherosclerotic heart disease of native coronary artery without angina pectoris: Secondary | ICD-10-CM | POA: Diagnosis not present

## 2017-02-27 NOTE — Progress Notes (Signed)
Daily Session Note  Patient Details  Name: Scott Jacobson MRN: [id number] Date of Birth: December 08, 1950 Referring Provider:     CARDIAC REHAB PHASE II ORIENTATION from 12/11/2016 in [place]  Referring Provider  Dr. Angelena Form      Encounter Date: 02/27/2017  Check In:     Session Check In - 02/27/17 0810      Check-In   Location [facility]   Staff Present Russella Dar, MS, EP, [facility], Exercise Physiologist;Tunisha Ruland Luther Parody, BS, EP, Exercise Physiologist   Supervising physician immediately available to respond to emergencies See telemetry face sheet for immediately available MD   Medication changes reported     No   Fall or balance concerns reported    No   Warm-up and Cool-down Performed as group-led instruction   Resistance Training Performed Yes   VAD Patient? No     Pain Assessment   Currently in Pain? No/denies   Pain Score 0-No pain   Multiple Pain Sites No      Capillary Blood Glucose: No results found for this or any previous visit (from the past 24 hour(s)).    History  Smoking Status  . Former Smoker  . Packs/day: 3.00  . Years: 16.00  . Types: Cigarettes  . Quit date: 02/04/1979  Smokeless Tobacco  . Never Used    Goals Met:  Independence with exercise equipment Exercise tolerated well No report of cardiac concerns or symptoms Strength training completed today  Goals Unmet:  Not Applicable  Comments: Check out 915   Dr. Kate Sable is Medical Director for [place] and Pulmonary Rehab.

## 2017-03-02 ENCOUNTER — Encounter (HOSPITAL_COMMUNITY): Payer: Medicare Other

## 2017-03-04 ENCOUNTER — Encounter (HOSPITAL_COMMUNITY)
Admission: RE | Admit: 2017-03-04 | Discharge: 2017-03-04 | Disposition: A | Discharge status: Home / Self Care | Payer: Medicare Other | Source: Ambulatory Visit | Attending: Cardiovascular Disease | Admitting: Cardiovascular Disease

## 2017-03-04 DIAGNOSIS — Z951 Presence of aortocoronary bypass graft: Secondary | ICD-10-CM | POA: Diagnosis not present

## 2017-03-04 DIAGNOSIS — I2510 Atherosclerotic heart disease of native coronary artery without angina pectoris: Secondary | ICD-10-CM | POA: Diagnosis not present

## 2017-03-04 NOTE — Progress Notes (Signed)
Daily Session Note  Patient Details  Name: Scott Jacobson MRN: [id number] Date of Birth: 1951/05/31 Referring Provider:     [place] from 12/11/2016 in [place]  Referring Provider  Dr. Angelena Form      Encounter Date: 03/04/2017  Check In:     Session Check In - 03/04/17 0808      Check-In   Location [facility]   Staff Present Aundra Dubin, RN, BSN;Larrisha Babineau Luther Parody, BS, EP, Exercise Physiologist   Supervising physician immediately available to respond to emergencies See telemetry face sheet for immediately available MD   Medication changes reported     No   Fall or balance concerns reported    No   Warm-up and Cool-down Performed as group-led instruction   Resistance Training Performed Yes   VAD Patient? No     Pain Assessment   Currently in Pain? No/denies   Pain Score 0-No pain   Multiple Pain Sites No      Capillary Blood Glucose: No results found for this or any previous visit (from the past 24 hour(s)).    History  Smoking Status  . Former Smoker  . Packs/day: 3.00  . Years: 16.00  . Types: Cigarettes  . Quit date: 02/04/1979  Smokeless Tobacco  . Never Used    Goals Met:  Independence with exercise equipment Exercise tolerated well No report of cardiac concerns or symptoms Strength training completed today  Goals Unmet:  Not Applicable  Comments: Check out 915   Dr. Kate Sable is Medical Director for [place] and Pulmonary Rehab.

## 2017-03-06 ENCOUNTER — Encounter (HOSPITAL_COMMUNITY)
Admission: RE | Admit: 2017-03-06 | Discharge: 2017-03-06 | Disposition: A | Discharge status: Home / Self Care | Payer: Medicare Other | Source: Ambulatory Visit | Attending: Cardiovascular Disease | Admitting: Cardiovascular Disease

## 2017-03-06 VITALS — Ht 69.0 in | Wt 206.5 lb

## 2017-03-06 DIAGNOSIS — Z951 Presence of aortocoronary bypass graft: Secondary | ICD-10-CM | POA: Diagnosis not present

## 2017-03-06 DIAGNOSIS — I2119 ST elevation (STEMI) myocardial infarction involving other coronary artery of inferior wall: Secondary | ICD-10-CM

## 2017-03-06 DIAGNOSIS — I2510 Atherosclerotic heart disease of native coronary artery without angina pectoris: Secondary | ICD-10-CM | POA: Insufficient documentation

## 2017-03-06 NOTE — Progress Notes (Signed)
Daily Session Note  Patient Details  Name: Scott Jacobson MRN: [id number] Date of Birth: [birth year] Referring Provider:     CARDIAC REHAB PHASE II ORIENTATION from 12/11/2016 in [place]  Referring Provider  Dr. Angelena Form      Encounter Date: 03/06/2017  Check In:     Session Check In - 03/06/17 0817      Check-In   Location [facility]   Staff Present Aundra Dubin, RN, BSN;Cataleia Gade Luther Parody, BS, EP, Exercise Physiologist   Supervising physician immediately available to respond to emergencies See telemetry face sheet for immediately available MD   Medication changes reported     No   Fall or balance concerns reported    No   Warm-up and Cool-down Performed as group-led instruction   Resistance Training Performed Yes   VAD Patient? No     Pain Assessment   Currently in Pain? No/denies   Pain Score 0-No pain   Multiple Pain Sites No      Capillary Blood Glucose: No results found for this or any previous visit (from the past 24 hour(s)).    History  Smoking Status  . Former Smoker  . Packs/day: 3.00  . Years: 16.00  . Types: Cigarettes  . Quit date: 02/04/1979  Smokeless Tobacco  . Never Used    Goals Met:  Independence with exercise equipment Exercise tolerated well No report of cardiac concerns or symptoms Strength training completed today  Goals Unmet:  Not Applicable  Comments: Check out 915   Dr. Kate Sable is Medical Director for [place] and Pulmonary Rehab.

## 2017-03-10 DIAGNOSIS — H43811 Vitreous degeneration, right eye: Secondary | ICD-10-CM | POA: Diagnosis not present

## 2017-03-11 NOTE — Progress Notes (Signed)
Discharge Summary  Patient Details  Name: Scott Jacobson MRN: [id number] Date of Birth: [birth year] Referring Provider:     [facility] from 12/11/2016 in [place]  Referring Provider  Dr. Angelena Form       Number of Visits: 36  Reason for Discharge:  Patient reached a stable level of exercise. Patient independent in their exercise.  Smoking History:  History  Smoking Status  . Former Smoker  . Packs/day: 3.00  . Years: 16.00  . Types: Cigarettes  . Quit date: 02/04/1979  Smokeless Tobacco  . Never Used    Diagnosis:  S/P CABG x 4  ST elevation myocardial infarction (STEMI) involving other coronary artery of inferior wall (HCC)  ADL UCSD:   Initial Exercise Prescription:     Initial Exercise Prescription - 12/11/16 1200      Date of Initial Exercise RX and Referring Provider   Date 12/11/16   Referring Provider Dr. Angelena Form     Treadmill   MPH 1.8   Grade 0   Minutes 15   METs 2.3     NuStep   Level 2   SPM 19   Minutes 20   METs 1.9     Prescription Details   Frequency (times per week) 3   Duration Progress to 30 minutes of continuous aerobic without signs/symptoms of physical distress     Intensity   THRR 40-80% of Max Heartrate [id number]   Ratings of Perceived Exertion 11-13   Perceived Dyspnea 0-4     Progression   Progression Continue progressive overload as per policy without signs/symptoms or physical distress.     Resistance Training   Training Prescription Yes   Weight 1   Reps 10-15      Discharge Exercise Prescription (Final Exercise Prescription Changes):     Exercise Prescription Changes - 02/19/17 0700      Response to Exercise   Blood Pressure (Admit) 128/70   Blood Pressure (Exercise) 138/70   Blood Pressure (Exit) 118/60   Heart Rate (Admit) 55 bpm   Heart Rate (Exercise) 105 bpm   Heart Rate (Exit) 65 bpm   Rating of Perceived Exertion (Exercise) 10   Duration Progress to  30 minutes of  aerobic without signs/symptoms of physical distress   Intensity THRR unchanged     Progression   Progression Continue to progress workloads to maintain intensity without signs/symptoms of physical distress.     Resistance Training   Training Prescription Yes   Weight 5   Reps 10-15     Treadmill   MPH 3.1   Grade 0.5   Minutes 15   METs 3.56     NuStep   Level 4   SPM 47   Minutes 20   METs 3.6     Home Exercise Plan   Plans to continue exercise at Home (comment)   Frequency Add 2 additional days to program exercise sessions.      Functional Capacity:     6 Minute Walk    Row Name 12/11/16 1203 03/06/17 1223       6 Minute Walk   Phase Initial Discharge    Distance 1500 feet 1800 feet    Distance % Change 0 % 20 %    Walk Time 6 minutes 6 minutes    # of Rest Breaks 0 0    MPH 2.84 3.4    METS 3.17 3.61    RPE 13 10  Perceived Dyspnea  13 13    VO2 Peak 12.2 14.08    Symptoms No No    Resting HR 63 bpm 56 bpm    Resting BP 142/76 128/70    Max Ex. HR 80 bpm 113 bpm    Max Ex. BP 150/84 138/70    2 Minute Post BP 140/74 132/68       Psychological, QOL, Others - Outcomes: PHQ 2/9: Depression screen [facility] 2/9 03/06/2017 12/11/2016  Decreased Interest 0 0  Down, Depressed, Hopeless 0 -  PHQ - 2 Score 0 0  Altered sleeping 1 0  Tired, decreased energy 0 0  Change in appetite 1 0  Feeling bad or failure about yourself  0 0  Trouble concentrating 0 0  Moving slowly or fidgety/restless 0 0  Suicidal thoughts 0 0  PHQ-9 Score 2 0  Difficult doing work/chores Not difficult at all Somewhat difficult    Quality of Life:     Quality of Life - 03/06/17 1226      Quality of Life Scores   Health/Function Pre 24.8 %   Health/Function Post 23.3 %   Health/Function % Change -6.05 %   Socioeconomic Pre 23.25 %   Socioeconomic Post 23.14 %   Socioeconomic % Change  -0.47 %   Psych/Spiritual Pre 24.86 %   Psych/Spiritual Post 24.86 %    Psych/Spiritual % Change 0 %   Family Pre 26.4 %   Family Post 24.5 %   Family % Change -7.2 %   GLOBAL Pre 24.69 %   GLOBAL Post 23.76 %   GLOBAL % Change -3.77 %      Personal Goals: Goals established at orientation with interventions provided to work toward goal.     Personal Goals and Risk Factors at Admission - 12/11/16 1231      Core Components/Risk Factors/Patient Goals on Admission    Weight Management Weight Maintenance   Tobacco Cessation --  Quit 1980   Personal Goal Other Yes   Personal Goal Get stronger and to get back to normal ADL's   Intervention Attend CR 3 x week and supplement home exercise 2 x week.   Expected Outcomes Achieve personal goals       Personal Goals Discharge:     Goals and Risk Factor Review    Row Name 12/11/16 2878 01/07/17 1439 01/26/17 1316 02/19/17 1557 03/11/17 1236     Core Components/Risk Factors/Patient Goals Review   Personal Goals Review Weight Management/Obesity  Get stronger and get back to normal ADL's Weight Management/Obesity  - Weight Management/Obesity Other  Get stronger; get back to doing ADL's.    Review  - Patient has completed 12 sessions gaining 6 lbs. He plans to attend class with dietitician 01/08/17. Will continue to monitor.  Patient has completed 20 sessions gaining 13.7 lbs. He is progressing well in the program. Patient has attended 30 sessions gaining 16 lbs. He has done well in the program but says he has continues to eat out alot. He says he feels better overall since he started the program.  Patient graduated with 36 sessions gaining 27 lbs. His medficts score did improve. Patient was not sure why he gained so much weight since starting the program. He says he feels much better since starting the program and believes it was very benefiticial.    Expected Outcomes  - Patient will complete the program and start to lose weight.  Patient will complete the program meeting his personal goals.  Patient will graduate and  work toward eating a heart healthy diet. Patient will continue exercising maintaining his weight and continue to eat a heart healthy diet.       Nutrition & Weight - Outcomes:     Pre Biometrics - 12/11/16 1205      Pre Biometrics   Height 5\' 9"  (1.753 m)   Weight 178 lb 12.7 oz (81.1 kg)   Waist Circumference 37 inches   Hip Circumference 38 inches   Waist to Hip Ratio 0.97 %   BMI (Calculated) 26.5   Triceps Skinfold 11 mm   % Body Fat 24.2 %   Grip Strength 67.67 kg   Flexibility 0 in   Single Leg Stand 5 seconds         Post Biometrics - 03/06/17 1225       Post  Biometrics   Height 5\' 9"  (1.753 m)   Weight 206 lb 8.4 oz (93.7 kg)   Waist Circumference 38 inches   Hip Circumference 39 inches   Waist to Hip Ratio 0.97 %   BMI (Calculated) 30.6   Triceps Skinfold 12 mm   % Body Fat 26.5 %   Grip Strength 64.86 kg   Flexibility 0 in   Single Leg Stand 26 seconds      Nutrition:   Nutrition Discharge:     Nutrition Assessments - 03/06/17 1349      MEDFICTS Scores   Pre Score 9   Post Score 6   Score Difference -3      Education Questionnaire Score:     Knowledge Questionnaire Score - 03/06/17 1349      Knowledge Questionnaire Score   Pre Score 17/24   Post Score 21/24      Goals reviewed with patient; copy given to patient.

## 2017-03-11 NOTE — Progress Notes (Signed)
Cardiac Individual Treatment Plan  Patient Details  Name: Scott Jacobson MRN: [id number] Date of Birth: December 08, 1950 Referring Provider:     CARDIAC REHAB PHASE II ORIENTATION from 12/11/2016 in [place]  Referring Provider  Dr. Angelena Form      Initial Encounter Date:    CARDIAC REHAB PHASE II ORIENTATION from 12/11/2016 in [place]  Date  12/11/16  Referring Provider  Dr. Angelena Form      Visit Diagnosis: S/P CABG x 4  ST elevation myocardial infarction (STEMI) involving other coronary artery of inferior wall ([place])  Patient's Home Medications on Admission:  Current Outpatient Prescriptions:  .  aspirin EC 81 MG tablet, Take 1 tablet (81 mg total) by mouth daily., Disp: , Rfl:  .  atorvastatin (LIPITOR) 80 MG tablet, Take 1 tablet (80 mg total) by mouth daily at 6 PM., Disp: 90 tablet, Rfl: 2 .  cetirizine (ZYRTEC) 10 MG tablet, Take 10 mg by mouth daily at 12 noon. , Disp: , Rfl:  .  clopidogrel (PLAVIX) 75 MG tablet, Take 1 tablet (75 mg total) by mouth daily., Disp: 30 tablet, Rfl: 1 .  metoprolol tartrate (LOPRESSOR) 25 MG tablet, Take 1 tablet (25 mg total) by mouth 2 (two) times daily., Disp: 60 tablet, Rfl: 11 .  Multiple Vitamins-Minerals (ICAPS MV PO), Take 1 tablet by mouth 2 (two) times daily. , Disp: , Rfl:  .  pantoprazole (PROTONIX) 40 MG tablet, Take 40 mg by mouth daily., Disp: , Rfl:  .  Polyvinyl Alcohol-Povidone (REFRESH OP), Apply 1 drop to eye 4 (four) times daily as needed (dry eyes)., Disp: , Rfl:  .  ramipril (ALTACE) 5 MG capsule, Take 5 mg by mouth daily., Disp: , Rfl:  .  tamsulosin (FLOMAX) 0.4 MG CAPS capsule, Take 0.4 mg by mouth daily., Disp: , Rfl:  .  traMADol (ULTRAM) 50 MG tablet, Take 1 tablet (50 mg total) by mouth every 4 (four) hours as needed for moderate pain., Disp: 30 tablet, Rfl: 0  Past Medical History: Past Medical History:  Diagnosis Date  . Anginal pain ([place])   . Arthritis    "probably"  .  BPH (benign prostatic hyperplasia)   . Coronary artery disease   . Dyspnea   . GERD (gastroesophageal reflux disease)   . History of kidney stones   . Hypertension   . Kidney stones   . Macular degeneration   . Myocardial infarction ([place])   . Seasonal allergies     Tobacco Use: History  Smoking Status  . Former Smoker  . Packs/day: 3.00  . Years: 16.00  . Types: Cigarettes  . Quit date: 02/04/1979  Smokeless Tobacco  . Never Used    Labs: Recent Review Flowsheet Data    Labs for ITP Cardiac and Pulmonary Rehab Latest Ref Rng & Units 10/03/2016 10/03/2016 10/03/2016 10/03/2016 10/04/2016   Cholestrol 0 - 200 mg/dL - - - - -   LDLCALC 0 - 99 mg/dL - - - - -   HDL >40 mg/dL - - - - -   Trlycerides <150 mg/dL - - - - -   Hemoglobin A1c 4.8 - 5.6 % - - - - -   PHART 7.350 - 7.450 7.308(L) - 7.349(L) 7.390 -   PCO2ART 32.0 - 48.0 mmHg 43.1 - 39.3 36.0 -   HCO3 20.0 - 28.0 mmol/L 21.6 - 21.6 21.7 -   TCO2 0 - 100 mmol/L '23 23 23 23 23   '$ ACIDBASEDEF 0.0 -  2.0 mmol/L 4.0(H) - 4.0(H) 3.0(H) -   O2SAT % 98.0 - 99.0 99.0 -      Capillary Blood Glucose: Lab Results  Component Value Date   GLUCAP 108 (H) 10/08/2016   GLUCAP 116 (H) 10/07/2016   GLUCAP 113 (H) 10/07/2016   GLUCAP 113 (H) 10/07/2016   GLUCAP 134 (H) 10/06/2016     Exercise Target Goals:    Exercise Program Goal: Individual exercise prescription set with THRR, safety & activity barriers. Participant demonstrates ability to understand and report RPE using BORG scale, to self-measure pulse accurately, and to acknowledge the importance of the exercise prescription.  Exercise Prescription Goal: Starting with aerobic activity 30 plus minutes a day, 3 days per week for initial exercise prescription. Provide home exercise prescription and guidelines that participant acknowledges understanding prior to discharge.  Activity Barriers & Risk Stratification:     Activity Barriers & Cardiac Risk Stratification -  12/11/16 1225      Activity Barriers & Cardiac Risk Stratification   Activity Barriers None   Cardiac Risk Stratification High      6 Minute Walk:     6 Minute Walk    Row Name 12/11/16 1203 03/06/17 1223       6 Minute Walk   Phase Initial Discharge    Distance 1500 feet 1800 feet    Distance % Change 0 % 20 %    Walk Time 6 minutes 6 minutes    # of Rest Breaks 0 0    MPH 2.84 3.4    METS 3.17 3.61    RPE 13 10    Perceived Dyspnea  13 13    VO2 Peak 12.2 14.08    Symptoms No No    Resting HR 63 bpm 56 bpm    Resting BP 142/76 128/70    Max Ex. HR 80 bpm 113 bpm    Max Ex. BP 150/84 138/70    2 Minute Post BP 140/74 132/68       Oxygen Initial Assessment:     Oxygen Initial Assessment - 12/11/16 1224      Home Oxygen   Home Oxygen Device None   Sleep Oxygen Prescription None   Home Exercise Oxygen Prescription None   Home at Rest Exercise Oxygen Prescription None     Initial 6 min Walk   Oxygen Used None     Program Oxygen Prescription   Program Oxygen Prescription None      Oxygen Re-Evaluation:   Oxygen Discharge (Final Oxygen Re-Evaluation):   Initial Exercise Prescription:     Initial Exercise Prescription - 12/11/16 1200      Date of Initial Exercise RX and Referring Provider   Date 12/11/16   Referring Provider Dr. [name]     Treadmill   MPH 1.8   Grade 0   Minutes 15   METs 2.3     NuStep   Level 2   SPM 19   Minutes 20   METs 1.9     Prescription Details   Frequency (times per week) 3   Duration Progress to 30 minutes of continuous aerobic without signs/symptoms of physical distress     Intensity   THRR 40-80% of Max Heartrate [phone]   Ratings of Perceived Exertion 11-13   Perceived Dyspnea 0-4     Progression   Progression Continue progressive overload as per policy without signs/symptoms or physical distress.     Resistance Training   Training Prescription Yes   Weight 1  Reps 10-15      Perform  Capillary Blood Glucose checks as needed.  Exercise Prescription Changes:      Exercise Prescription Changes    Row Name 12/24/16 1100 01/06/17 1200 01/23/17 1500 02/19/17 0700       Response to Exercise   Blood Pressure (Admit) 118/60 134/72 146/80 128/70    Blood Pressure (Exercise) 138/74 146/70 144/78 138/70    Blood Pressure (Exit) 116/62 128/70 118/70 118/60    Heart Rate (Admit) 71 bpm 65 bpm 64 bpm 55 bpm    Heart Rate (Exercise) 99 bpm 94 bpm 97 bpm 105 bpm    Heart Rate (Exit) 79 bpm 96 bpm 73 bpm 65 bpm    Rating of Perceived Exertion (Exercise) '10 10 10 10    '$ Duration Progress to 30 minutes of  aerobic without signs/symptoms of physical distress Progress to 30 minutes of  aerobic without signs/symptoms of physical distress Progress to 30 minutes of  aerobic without signs/symptoms of physical distress Progress to 30 minutes of  aerobic without signs/symptoms of physical distress    Intensity THRR unchanged THRR unchanged THRR unchanged THRR unchanged      Progression   Progression Continue to progress workloads to maintain intensity without signs/symptoms of physical distress. Continue to progress workloads to maintain intensity without signs/symptoms of physical distress. Continue to progress workloads to maintain intensity without signs/symptoms of physical distress. Continue to progress workloads to maintain intensity without signs/symptoms of physical distress.      Resistance Training   Training Prescription Yes Yes Yes Yes    Weight '3 4 4 5    '$ Reps 10-15 10-15 10-15 10-15      Treadmill   MPH 2.3 2.6 2.9 3.1    Grade 0 0 0.5 0.5    Minutes '15 15 15 15    '$ METs 2.7 2.98 3.4 3.56      NuStep   Level '3 3 4 4    '$ SPM 21 35 51 47    Minutes '20 20 20 20    '$ METs 3.1 3.57 3.59 3.6      Home Exercise Plan   Plans to continue exercise at Home (comment) Home (comment) Home (comment) Home (comment)    Frequency Add 2 additional days to program exercise sessions. Add 2  additional days to program exercise sessions. Add 2 additional days to program exercise sessions. Add 2 additional days to program exercise sessions.       Exercise Comments:      Exercise Comments    Row Name 12/24/16 1142 01/06/17 1245 01/23/17 1502 02/19/17 0741     Exercise Comments Patient is doing very well in CR rehab Patient is doing well in CR.  Patient is progressing very well in CR.  Patient is progressing well in CR.        Exercise Goals and Review:      Exercise Goals    Row Name 12/11/16 1228             Exercise Goals   Increase Physical Activity Yes       Intervention Provide advice, education, support and counseling about physical activity/exercise needs.;Develop an individualized exercise prescription for aerobic and resistive training based on initial evaluation findings, risk stratification, comorbidities and participant's personal goals.       Expected Outcomes Achievement of increased cardiorespiratory fitness and enhanced flexibility, muscular endurance and strength shown through measurements of functional capacity and personal statement of participant.  Increase Strength and Stamina Yes       Intervention Provide advice, education, support and counseling about physical activity/exercise needs.;Develop an individualized exercise prescription for aerobic and resistive training based on initial evaluation findings, risk stratification, comorbidities and participant's personal goals.       Expected Outcomes Achievement of increased cardiorespiratory fitness and enhanced flexibility, muscular endurance and strength shown through measurements of functional capacity and personal statement of participant.          Exercise Goals Re-Evaluation :     Exercise Goals Re-Evaluation    Row Name 01/07/17 1440 01/26/17 1317 02/19/17 1559 03/11/17 1240       Exercise Goal Re-Evaluation   Exercise Goals Review Increase Physical Activity;Increase Strenth and  Stamina  Get back to doing ADL's.  Increase Physical Activity;Increase Strenth and Stamina  Get stronger; get back to doing ADL's. Increase Physical Activity;Increase Strenth and Stamina  Get stronger; get back to doing ADL's. Increase Physical Activity;Increase Strenth and Stamina    Comments After 12 sessions, patient has progressed well with increased strength, stamina, and activity. He says he is feeling stronger and is doing more things around the house.  After completing 20 sessions, patient reports he is getting stronger. He is able to do his ADL's and able to do some work now. He is feeling better overall. After completing 30 sessions, patient continues to progress in the program reporting feeling stronger and having more stamina. He is able to do his ADL's and has returned to work. He is doing more around the house.  Patient graduated with 36 sessions with increased strength, stamina, and activity. He improved by 20% on his exit walk test. His exit balance test also improved. He says his activity has increased working more and walking 3 times/week 2 miles.     Expected Outcomes Patient will complete the program with continued increased strength, stamina, and activity. Patient will complete the program with continued increased strength, stamina, and activity. Patient will graduate and continue to exercise with continued increased strength, stamina, and activity. Patient will continue to exercise with continued increased strength, stamina, and activity.        Discharge Exercise Prescription (Final Exercise Prescription Changes):     Exercise Prescription Changes - 02/19/17 0700      Response to Exercise   Blood Pressure (Admit) 128/70   Blood Pressure (Exercise) 138/70   Blood Pressure (Exit) 118/60   Heart Rate (Admit) 55 bpm   Heart Rate (Exercise) 105 bpm   Heart Rate (Exit) 65 bpm   Rating of Perceived Exertion (Exercise) 10   Duration Progress to 30 minutes of  aerobic without  signs/symptoms of physical distress   Intensity THRR unchanged     Progression   Progression Continue to progress workloads to maintain intensity without signs/symptoms of physical distress.     Resistance Training   Training Prescription Yes   Weight 5   Reps 10-15     Treadmill   MPH 3.1   Grade 0.5   Minutes 15   METs 3.56     NuStep   Level 4   SPM 47   Minutes 20   METs 3.6     Home Exercise Plan   Plans to continue exercise at Home (comment)   Frequency Add 2 additional days to program exercise sessions.      Nutrition:  Target Goals: Understanding of nutrition guidelines, daily intake of sodium '1500mg'$ , cholesterol '200mg'$ , calories 30% from fat and 7% or less  from saturated fats, daily to have 5 or more servings of fruits and vegetables.  Biometrics:     Pre Biometrics - 12/11/16 1205      Pre Biometrics   Height '5\' 9"'$  (1.753 m)   Weight 178 lb 12.7 oz (81.1 kg)   Waist Circumference 37 inches   Hip Circumference 38 inches   Waist to Hip Ratio 0.97 %   BMI (Calculated) 26.5   Triceps Skinfold 11 mm   % Body Fat 24.2 %   Grip Strength 67.67 kg   Flexibility 0 in   Single Leg Stand 5 seconds         Post Biometrics - 03/06/17 1225       Post  Biometrics   Height '5\' 9"'$  (1.753 m)   Weight 206 lb 8.4 oz (93.7 kg)   Waist Circumference 38 inches   Hip Circumference 39 inches   Waist to Hip Ratio 0.97 %   BMI (Calculated) 30.6   Triceps Skinfold 12 mm   % Body Fat 26.5 %   Grip Strength 64.86 kg   Flexibility 0 in   Single Leg Stand 26 seconds      Nutrition Therapy Plan and Nutrition Goals:   Nutrition Discharge: Rate Your Plate Scores:     Nutrition Assessments - 03/06/17 1349      MEDFICTS Scores   Pre Score 9   Post Score 6   Score Difference -3      Nutrition Goals Re-Evaluation:   Nutrition Goals Discharge (Final Nutrition Goals Re-Evaluation):   Psychosocial: Target Goals: Acknowledge presence or absence of significant  depression and/or stress, maximize coping skills, provide positive support system. Participant is able to verbalize types and ability to use techniques and skills needed for reducing stress and depression.  Initial Review & Psychosocial Screening:     Initial Psych Review & Screening - 12/11/16 1233      Initial Review   Current issues with None Identified     Family Dynamics   Good Support System? Yes     Barriers   Psychosocial barriers to participate in program There are no identifiable barriers or psychosocial needs.     Screening Interventions   Interventions Encouraged to exercise      Quality of Life Scores:     Quality of Life - 03/06/17 1226      Quality of Life Scores   Health/Function Pre 24.8 %   Health/Function Post 23.3 %   Health/Function % Change -6.05 %   Socioeconomic Pre 23.25 %   Socioeconomic Post 23.14 %   Socioeconomic % Change  -0.47 %   Psych/Spiritual Pre 24.86 %   Psych/Spiritual Post 24.86 %   Psych/Spiritual % Change 0 %   Family Pre 26.4 %   Family Post 24.5 %   Family % Change -7.2 %   GLOBAL Pre 24.69 %   GLOBAL Post 23.76 %   GLOBAL % Change -3.77 %      PHQ-9: Recent Review Flowsheet Data    Depression screen [facility] 2/9 03/06/2017 12/11/2016   Decreased Interest 0 0   Down, Depressed, Hopeless 0 -   PHQ - 2 Score 0 0   Altered sleeping 1 0   Tired, decreased energy 0 0   Change in appetite 1 0   Feeling bad or failure about yourself  0 0   Trouble concentrating 0 0   Moving slowly or fidgety/restless 0 0   Suicidal thoughts 0 0  PHQ-9 Score 2 0   Difficult doing work/chores Not difficult at all Somewhat difficult     Interpretation of Total Score  Total Score Depression Severity:  1-4 = Minimal depression, 5-9 = Mild depression, 10-14 = Moderate depression, 15-19 = Moderately severe depression, 20-27 = Severe depression   Psychosocial Evaluation and Intervention:     Psychosocial Evaluation - 03/11/17 1245       Discharge Psychosocial Assessment & Intervention   Comments Patient discharge PHQ-9 and QOL score did not improve. He has no psychosocial issues identifed at discharge.       Psychosocial Re-Evaluation:     Psychosocial Re-Evaluation    Row Name 01/07/17 1442 01/26/17 1319 02/19/17 1601         Psychosocial Re-Evaluation   Current issues with None Identified None Identified None Identified     Comments Patient's QOL score is 24.69 and his PHQ-9 score is 9. No psychosocial barriers identified.   -  -     Expected Outcomes Patient will have no psychosocial barriers identified at discharge.   - Patient will have no psychosocial issues identified at discharge.      Interventions Encouraged to attend Cardiac Rehabilitation for the exercise Encouraged to attend Cardiac Rehabilitation for the exercise Encouraged to attend Cardiac Rehabilitation for the exercise     Continue Psychosocial Services  No Follow up required No Follow up required No Follow up required        Psychosocial Discharge (Final Psychosocial Re-Evaluation):     Psychosocial Re-Evaluation - 02/19/17 1601      Psychosocial Re-Evaluation   Current issues with None Identified   Expected Outcomes Patient will have no psychosocial issues identified at discharge.    Interventions Encouraged to attend Cardiac Rehabilitation for the exercise   Continue Psychosocial Services  No Follow up required      Vocational Rehabilitation: Provide vocational rehab assistance to qualifying candidates.   Vocational Rehab Evaluation & Intervention:     Vocational Rehab - 12/11/16 1221      Initial Vocational Rehab Evaluation & Intervention   Assessment shows need for Vocational Rehabilitation No  Self employed      Education: Education Goals: Education classes will be provided on a weekly basis, covering required topics. Participant will state understanding/return demonstration of topics presented.  Learning  Barriers/Preferences:     Learning Barriers/Preferences - 12/11/16 1220      Learning Barriers/Preferences   Learning Barriers None   Learning Preferences Skilled Demonstration;Individual Instruction;Group Instruction      Education Topics: Hypertension, Hypertension Reduction -Define heart disease and high blood pressure. Discus how high blood pressure affects the body and ways to reduce high blood pressure.   CARDIAC REHAB PHASE II EXERCISE from 03/04/2017 in [place]  Date  02/04/17  Educator  Russella Dar  Instruction Review Code  2- meets goals/outcomes      Exercise and Your Heart -Discuss why it is important to exercise, the FITT principles of exercise, normal and abnormal responses to exercise, and how to exercise safely.   CARDIAC REHAB PHASE II EXERCISE from 03/04/2017 in [place]  Date  02/11/17  Educator  DC  Instruction Review Code  2- meets goals/outcomes      Angina -Discuss definition of angina, causes of angina, treatment of angina, and how to decrease risk of having angina.   CARDIAC REHAB PHASE II EXERCISE from 03/04/2017 in [place]  Date  02/18/17  Educator  DC  Instruction Review Code  2- meets goals/outcomes      Cardiac Medications -Review what the following cardiac medications are used for, how they affect the body, and side effects that may occur when taking the medications.  Medications include Aspirin, Beta blockers, calcium channel blockers, ACE Inhibitors, angiotensin receptor blockers, diuretics, digoxin, and antihyperlipidemics.   CARDIAC REHAB PHASE II EXERCISE from 03/04/2017 in [place]  Date  02/25/17  Educator  DJ  Instruction Review Code  2- meets goals/outcomes      Congestive Heart Failure -Discuss the definition of CHF, how to live with CHF, the signs and symptoms of CHF, and how keep track of weight and sodium intake.   CARDIAC REHAB  PHASE II EXERCISE from 03/04/2017 in [place]  Date  03/04/17  Educator  Dc  Instruction Review Code  2- meets goals/outcomes      Heart Disease and Intimacy -Discus the effect sexual activity has on the heart, how changes occur during intimacy as we age, and safety during sexual activity.   Smoking Cessation / COPD -Discuss different methods to quit smoking, the health benefits of quitting smoking, and the definition of COPD.   CARDIAC REHAB PHASE II EXERCISE from 03/04/2017 in [place]  Date  12/17/16  Educator  DC  Instruction Review Code  2- meets goals/outcomes      Nutrition I: Fats -Discuss the types of cholesterol, what cholesterol does to the heart, and how cholesterol levels can be controlled.   CARDIAC REHAB PHASE II EXERCISE from 03/04/2017 in [place]  Date  12/24/16  Educator  DC  Instruction Review Code  2- meets goals/outcomes      Nutrition II: Labels -Discuss the different components of food labels and how to read food label   CARDIAC REHAB PHASE II EXERCISE from 03/04/2017 in [place]  Date  12/31/16  Educator  Dc  Instruction Review Code  2- meets goals/outcomes      Heart Parts and Heart Disease -Discuss the anatomy of the heart, the pathway of blood circulation through the heart, and these are affected by heart disease.   CARDIAC REHAB PHASE II EXERCISE from 03/04/2017 in [place]  Date  01/07/17  Educator  DJ  Instruction Review Code  2- meets goals/outcomes      Stress I: Signs and Symptoms -Discuss the causes of stress, how stress may lead to anxiety and depression, and ways to limit stress.   CARDIAC REHAB PHASE II EXERCISE from 03/04/2017 in [place]  Date  01/14/17  Educator  D. Coad  Instruction Review Code  2- meets goals/outcomes      Stress II: Relaxation -Discuss different types of  relaxation techniques to limit stress.   CARDIAC REHAB PHASE II EXERCISE from 03/04/2017 in [place]  Date  01/21/17  Educator  DJ  Instruction Review Code  2- meets goals/outcomes      Warning Signs of Stroke / TIA -Discuss definition of a stroke, what the signs and symptoms are of a stroke, and how to identify when someone is having stroke.   CARDIAC REHAB PHASE II EXERCISE from 03/04/2017 in [place]  Date  01/28/17  Educator  Dc  Instruction Review Code  2- meets goals/outcomes      Knowledge Questionnaire Score:     Knowledge Questionnaire Score - 03/06/17 1349      Knowledge Questionnaire Score  Pre Score 17/24   Post Score 21/24      Core Components/Risk Factors/Patient Goals at Admission:     Personal Goals and Risk Factors at Admission - 12/11/16 1231      Core Components/Risk Factors/Patient Goals on Admission    Weight Management Weight Maintenance   Tobacco Cessation --  Quit 1980   Personal Goal Other Yes   Personal Goal Get stronger and to get back to normal ADL's   Intervention Attend CR 3 x week and supplement home exercise 2 x week.   Expected Outcomes Achieve personal goals      Core Components/Risk Factors/Patient Goals Review:      Goals and Risk Factor Review    Row Name 12/11/16 1232 01/07/17 1439 01/26/17 1316 02/19/17 1557 03/11/17 1236     Core Components/Risk Factors/Patient Goals Review   Personal Goals Review Weight Management/Obesity  Get stronger and get back to normal ADL's Weight Management/Obesity  - Weight Management/Obesity Other  Get stronger; get back to doing ADL's.    Review  - Patient has completed 12 sessions gaining 6 lbs. He plans to attend class with dietitician 01/08/17. Will continue to monitor.  Patient has completed 20 sessions gaining 13.7 lbs. He is progressing well in the program. Patient has attended 30 sessions gaining 16 lbs. He has done well in the program but  says he has continues to eat out alot. He says he feels better overall since he started the program.  Patient graduated with 36 sessions gaining 27 lbs. His medficts score did improve. Patient was not sure why he gained so much weight since starting the program. He says he feels much better since starting the program and believes it was very benefiticial.    Expected Outcomes  - Patient will complete the program and start to lose weight.  Patient will complete the program meeting his personal goals.  Patient will graduate and work toward eating a heart healthy diet. Patient will continue exercising maintaining his weight and continue to eat a heart healthy diet.       Core Components/Risk Factors/Patient Goals at Discharge (Final Review):      Goals and Risk Factor Review - 03/11/17 1236      Core Components/Risk Factors/Patient Goals Review   Personal Goals Review Other  Get stronger; get back to doing ADL's.    Review Patient graduated with 36 sessions gaining 27 lbs. His medficts score did improve. Patient was not sure why he gained so much weight since starting the program. He says he feels much better since starting the program and believes it was very benefiticial.    Expected Outcomes Patient will continue exercising maintaining his weight and continue to eat a heart healthy diet.       ITP Comments:     ITP Comments    Row Name 01/08/17 1538           ITP Comments Patient met with Registered Dietitian to discuss nutrition topics including: Heart healthty eating, heart health cooking and make smart choices when shopping; Portion control; weight management; and hydration. Patient attended a group session with the hospital chaplian called [facility] to discuss and share how this recent diagnosis has effected their life          Comments: Patient graduated from [facility] today on 03/06/2017 after completing 36 sessions. He achieved LTG of 30 minutes of aerobic  exercise at Max Met level of 3.56. All patients vitals are WNL. Patient has met  with dietician. Discharge instruction has been reviewed in detail and patient stated an understanding of material given. Patient plans to exercise at home walking. Cardiac Rehab staff will make f/u calls at 1 month, 6 months, and 1 year. Patient had no complaints of any abnormal S/S or pain on their exit visit.

## 2017-03-23 IMAGING — CR DG CHEST 2V
2 series · 2 of 2 positions shown · non-contrast
Comparison: 04/02/2012

CLINICAL DATA: Preop CABG

EXAM:
CHEST  2 VIEW

[w chest pa]
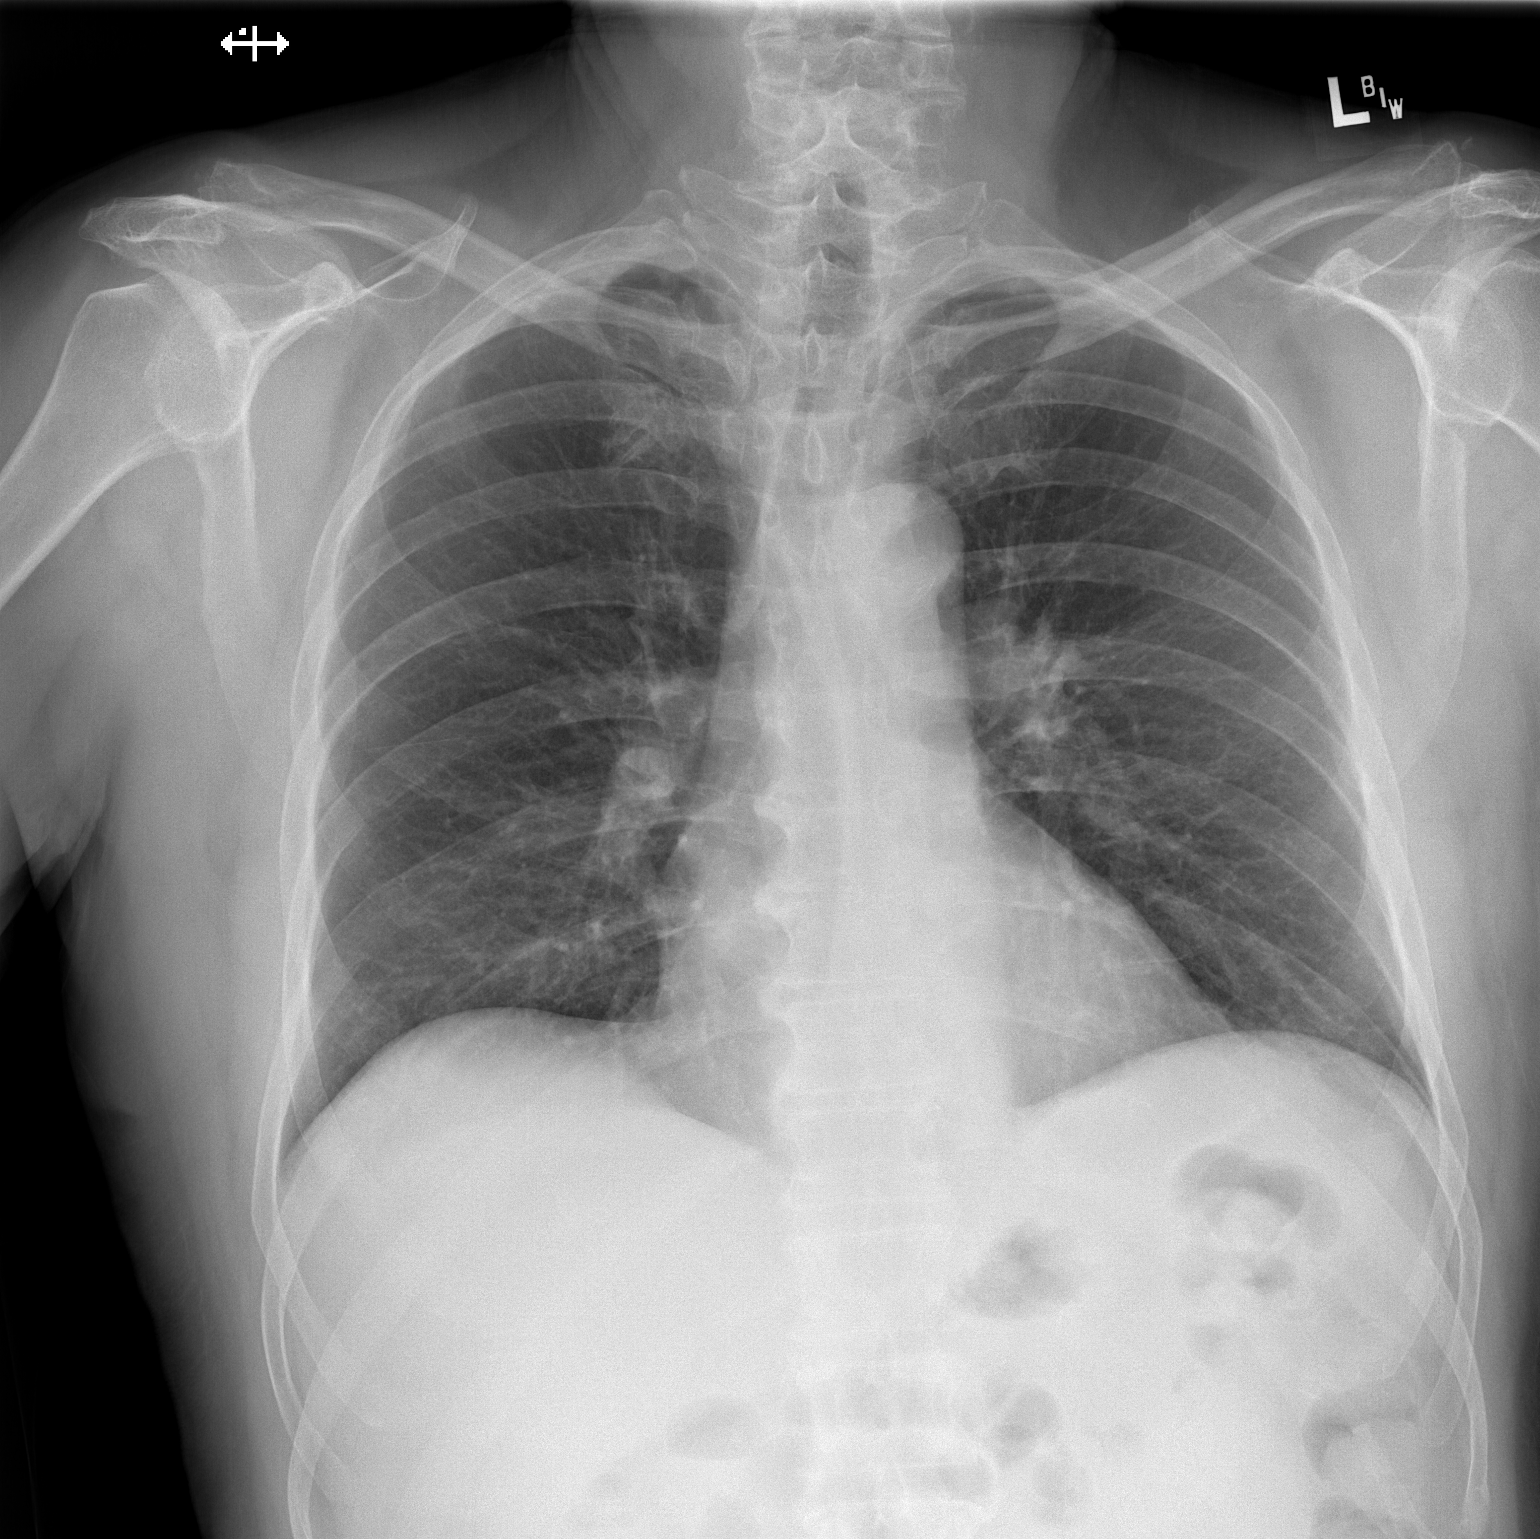

[w chest lat]
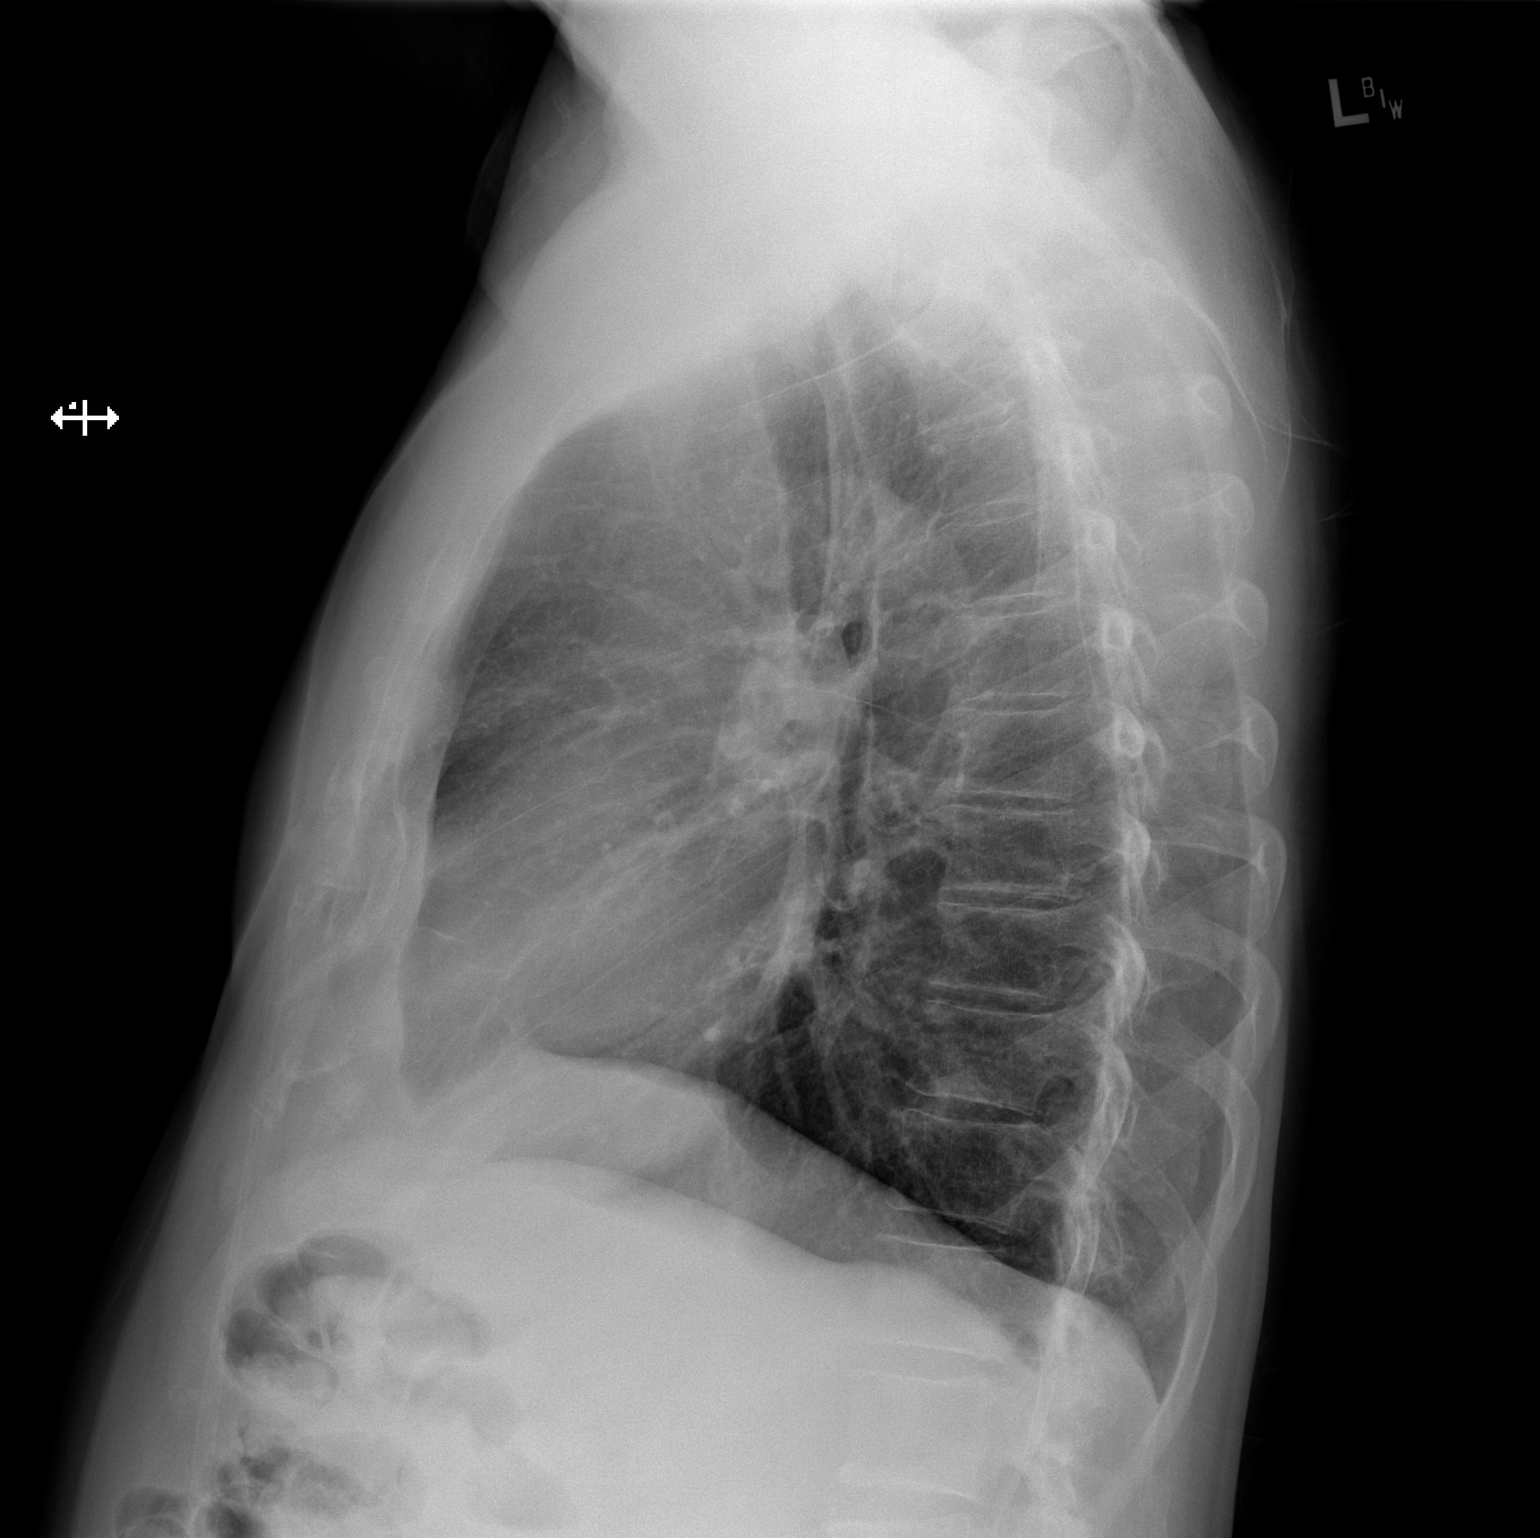

[2 of 2 positions shown; findings below may reference images not displayed]

FINDINGS: The heart size and mediastinal contours are within normal limits.
Both lungs are clear. The visualized skeletal structures are
unremarkable.
IMPRESSION: No active cardiopulmonary disease.

## 2017-03-25 IMAGING — DX DG CHEST 1V PORT
1 series · 1 of 1 positions shown · non-contrast
Comparison: 10/01/2016

CLINICAL DATA: Post CABG x 4. Hx MI, HTN, CAD, former smoker.

EXAM:
PORTABLE CHEST 1 VIEW

[chest ap]
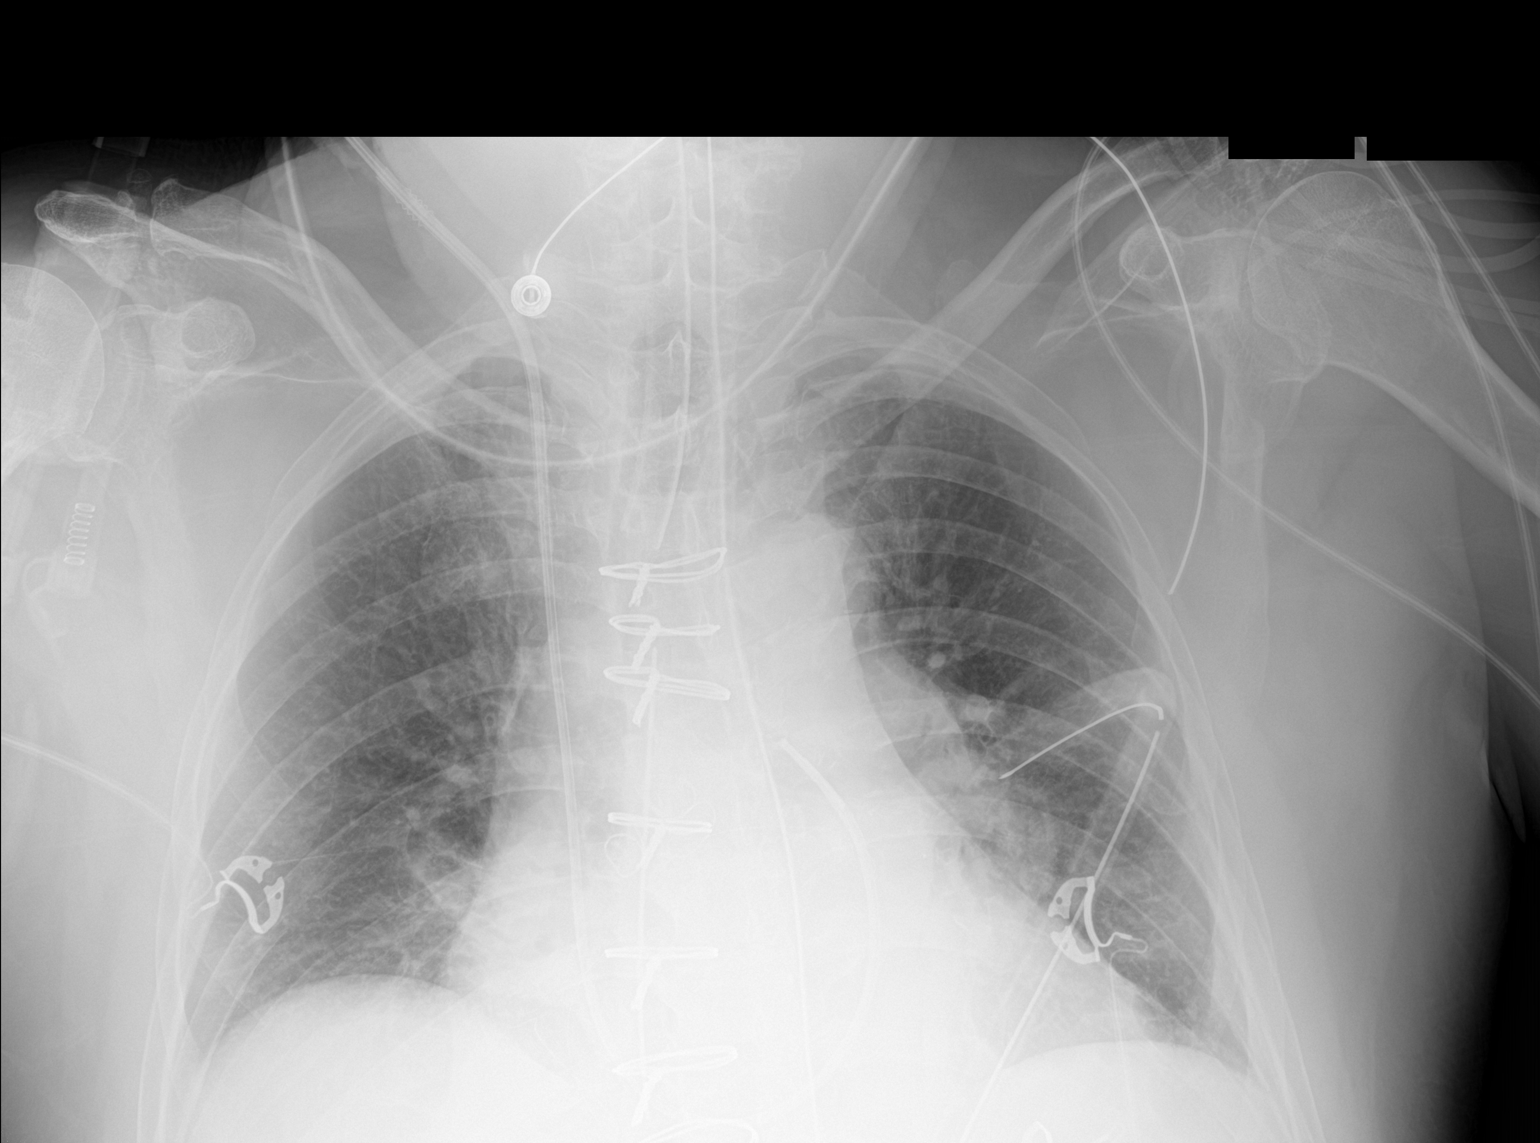

[1 of 1 positions shown; findings below may reference images not displayed]

FINDINGS: Endotracheal to 4.5 cm from carina. Interval midline sternotomy.
Swan-Ganz catheter in the main pulmonary artery. LEFT chest tube and
mediastinal drain noted. LEFT basilar atelectasis. No pneumothorax.
IMPRESSION: Patient status post midline sternotomy. Support apparatus in good
position.

## 2017-03-27 IMAGING — CR DG CHEST 1V PORT
1 series · 1 of 1 positions shown · non-contrast
Comparison: 10/04/2016 and earlier.

CLINICAL DATA: 65-year-old male status post CABG. Initial
encounter.

EXAM:
PORTABLE CHEST 1 VIEW

[AP]
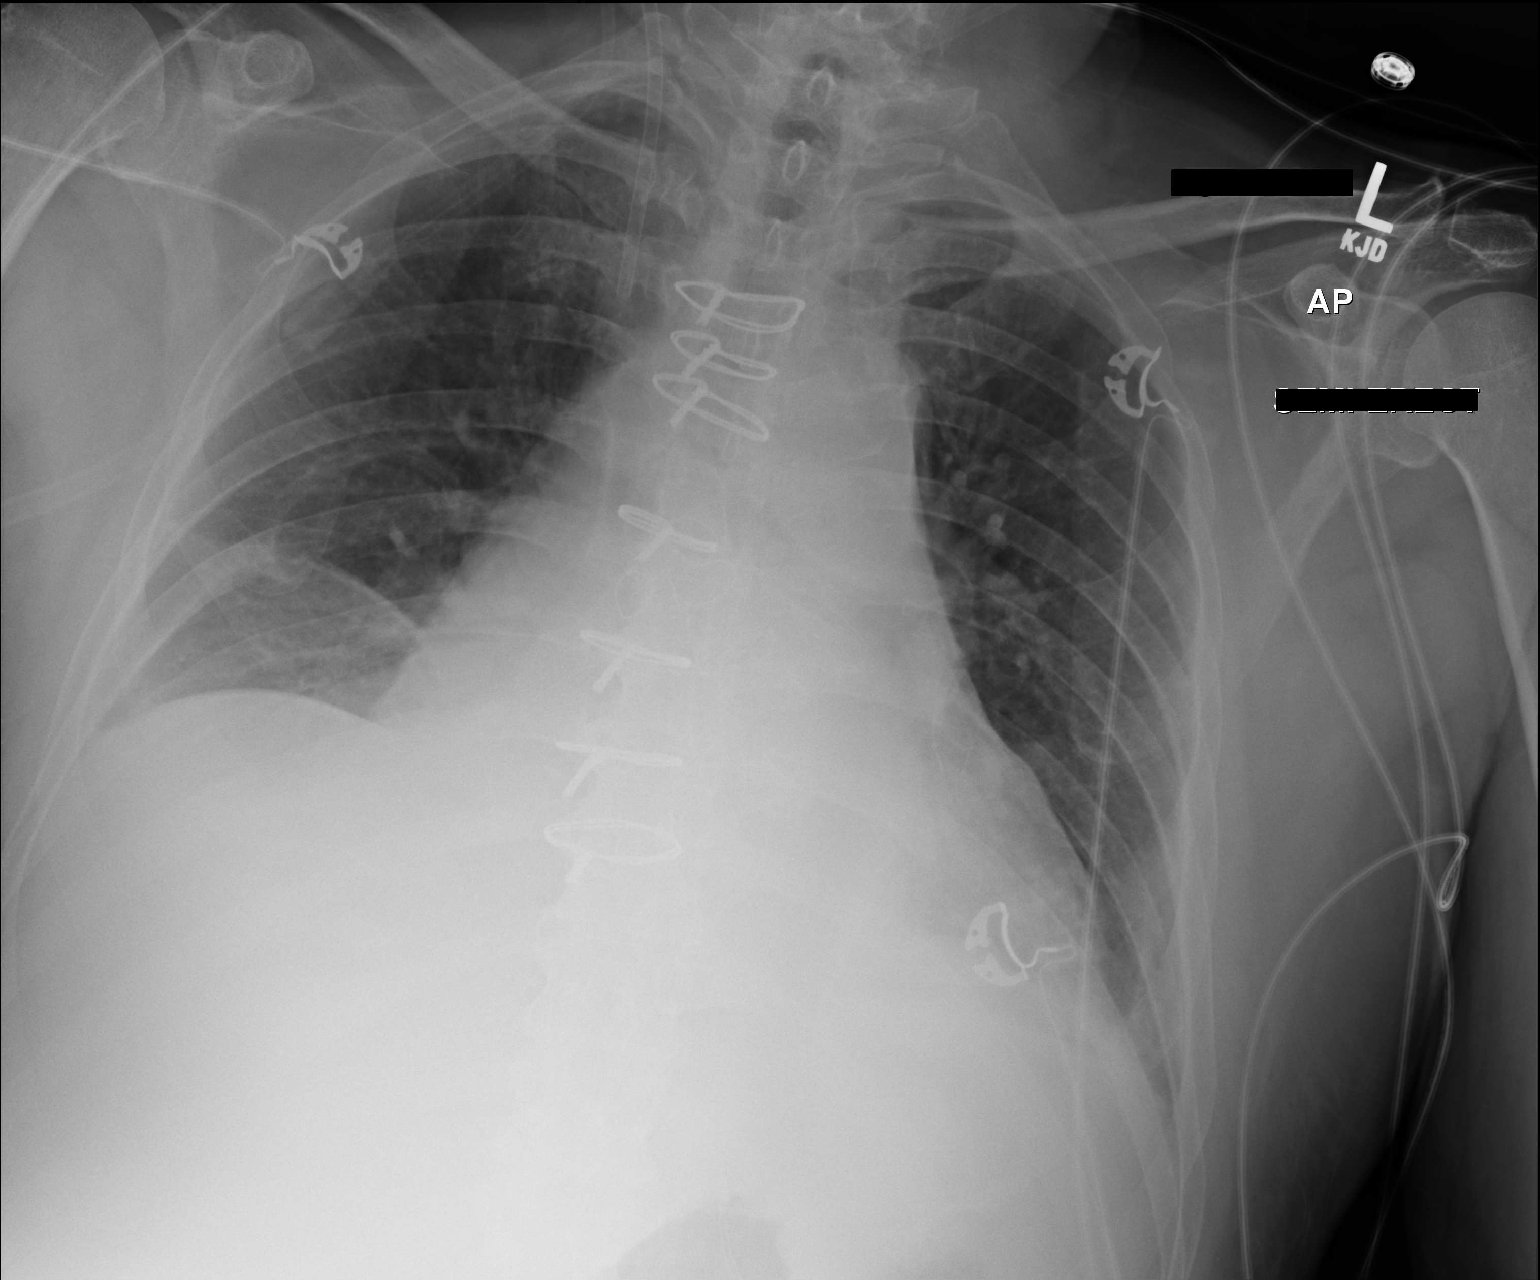

[1 of 1 positions shown; findings below may reference images not displayed]

FINDINGS: Portable AP semi upright view at 2558 hours. Right IJ approach
Swan-Ganz catheter removed, right IJ introducer sheath remains. Left
chest tube and mediastinal tubes removed.

No pneumothorax. Stable cardiac size and mediastinal contours.
Mildly improved bibasilar ventilation especially on the right. No
pulmonary edema. Possible small pleural effusions. Sequelae of CABG.
Epicardial pacer wires remain.
IMPRESSION: 1. Left chest tube and mediastinal tube removed with no
pneumothorax. Swan-Ganz catheter removed, right IJ introducer sheath
remains.
2. Regressed basilar atelectasis.  Probable small pleural effusions.

## 2017-03-28 IMAGING — CR DG CHEST 2V
2 series · 2 of 2 positions shown · non-contrast
Comparison: 10/05/2016

CLINICAL DATA: Pt c/o general chest soreness since having open
heart surgery a few days ago and some sob; eval atelectasis per pt's
chart.

EXAM:
CHEST  2 VIEW

[chest lat]
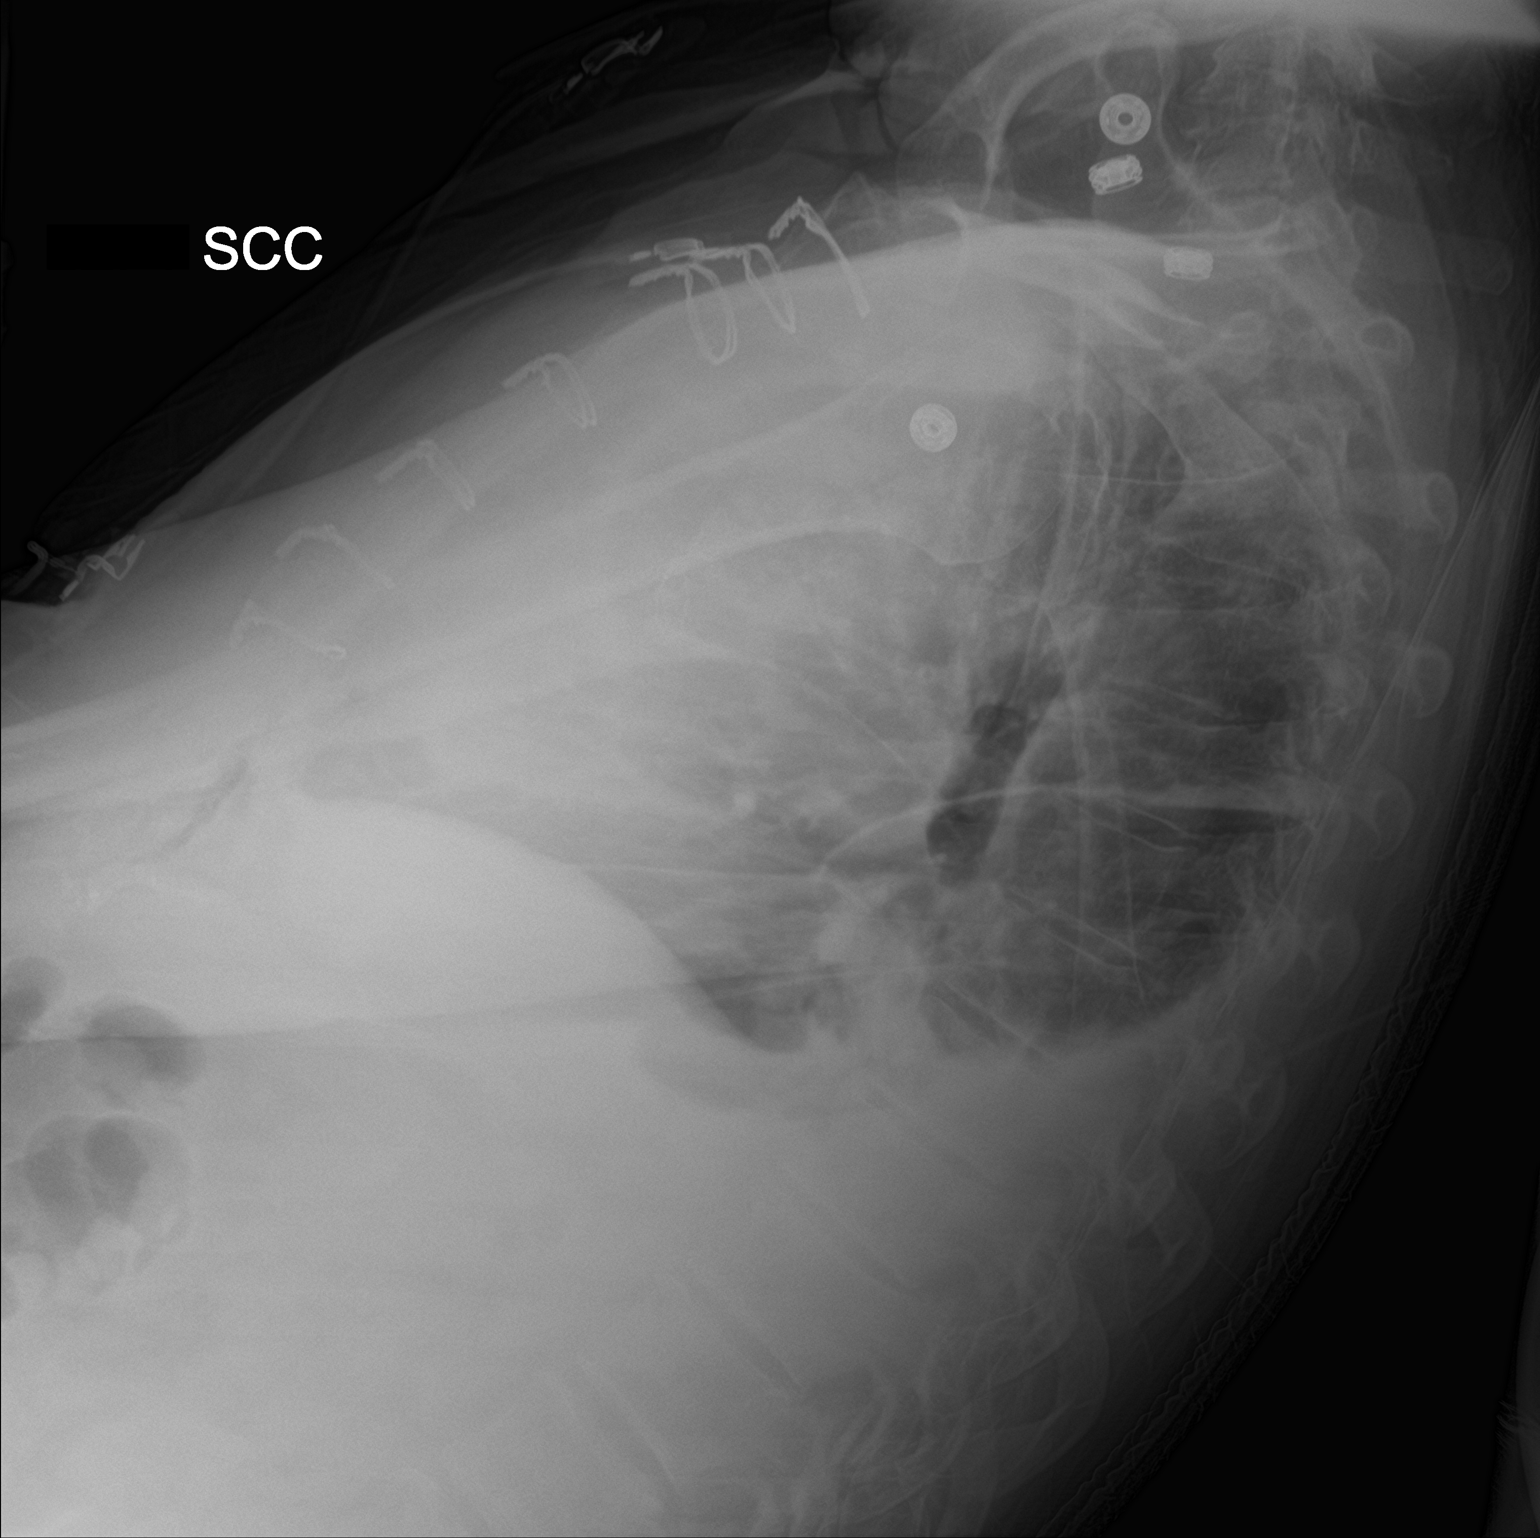

[chest ap]
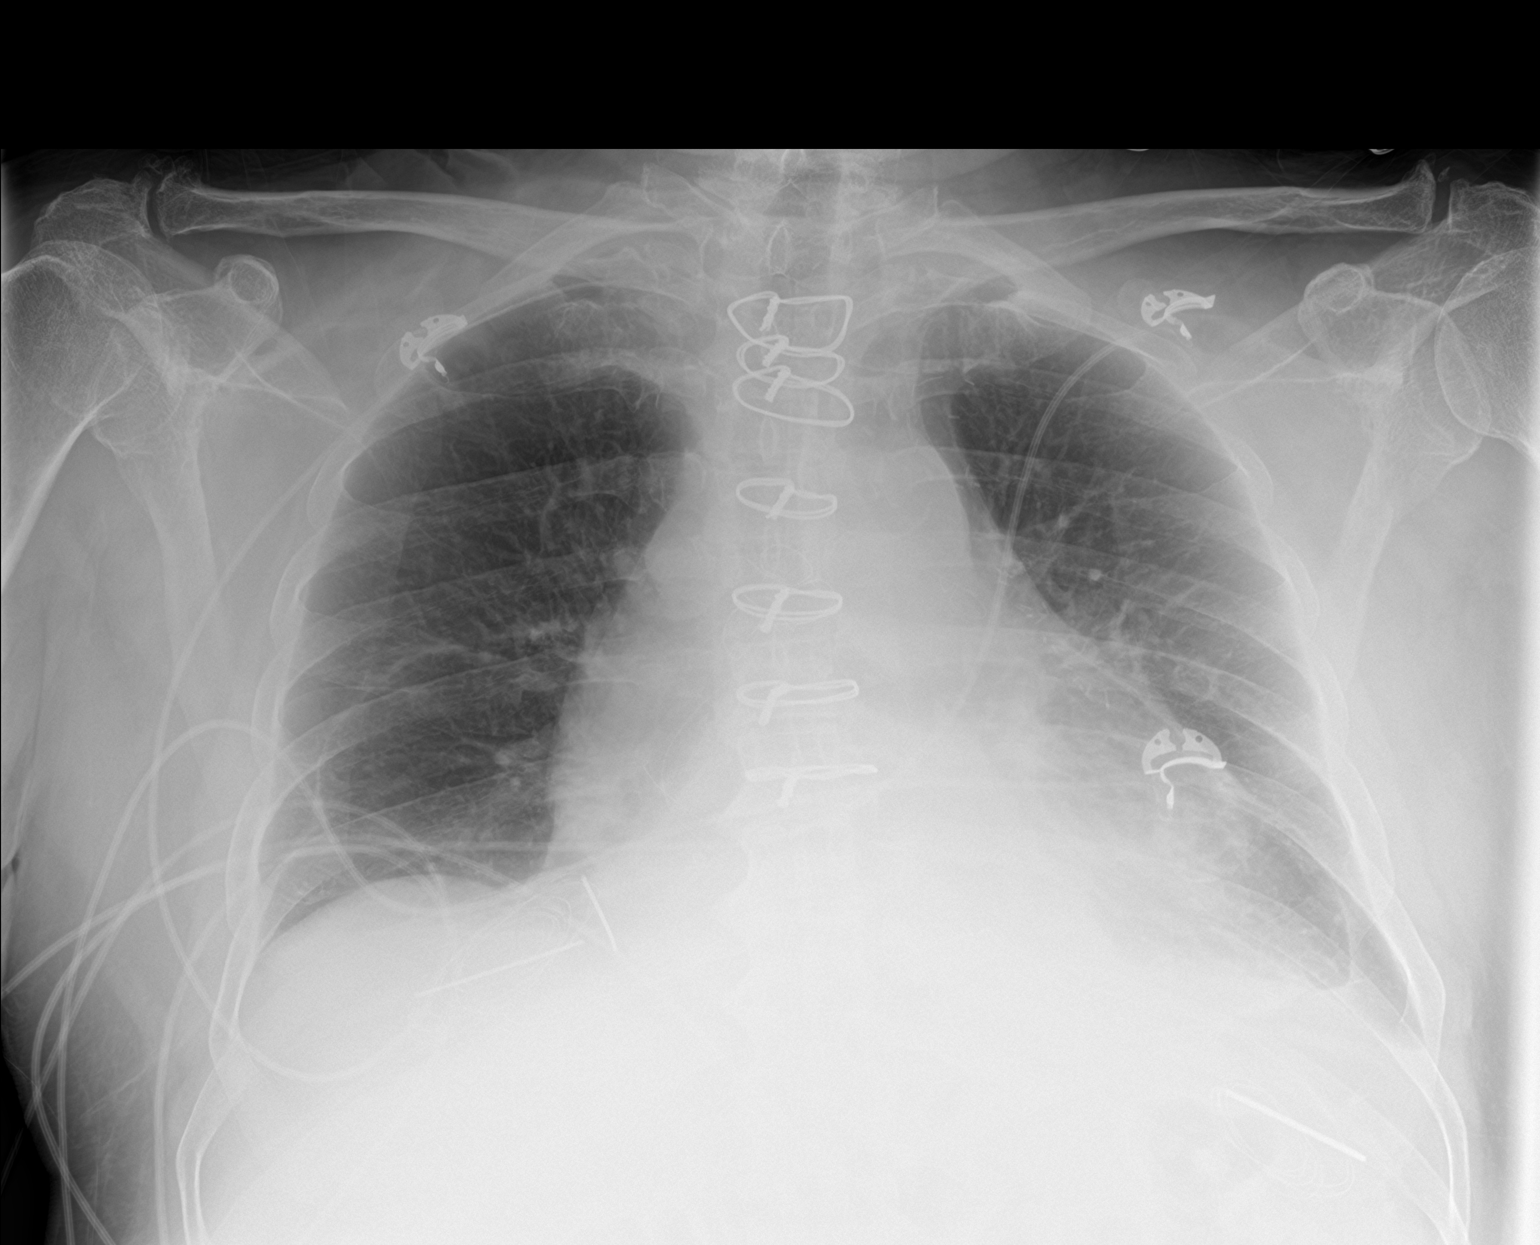

[2 of 2 positions shown; findings below may reference images not displayed]

FINDINGS: Since the previous exam, the right jugular Cordis has been removed.

Basilar opacity has mildly improved on the right, similar in a left,
consistent with a combination of pleural fluid and atelectasis. This
remains most apparent on the left. No new lung abnormalities.
Specifically, no pulmonary edema. No pneumothorax.

No mediastinal widening. Cardiac silhouette is mildly enlarged
stable.
IMPRESSION: 1. No acute findings or evidence of an operative complication.
Specifically no pulmonary edema, pneumothorax or mediastinal
widening.
2. Mild improvement in right base atelectasis. No other change.
Persistent left greater right pleural effusions with left lung base
atelectasis.

## 2017-04-27 DIAGNOSIS — N4289 Other specified disorders of prostate: Secondary | ICD-10-CM | POA: Diagnosis not present

## 2017-04-27 DIAGNOSIS — I25798 Atherosclerosis of other coronary artery bypass graft(s) with other forms of angina pectoris: Secondary | ICD-10-CM | POA: Diagnosis not present

## 2017-04-27 DIAGNOSIS — K210 Gastro-esophageal reflux disease with esophagitis: Secondary | ICD-10-CM | POA: Diagnosis not present

## 2017-04-27 DIAGNOSIS — I10 Essential (primary) hypertension: Secondary | ICD-10-CM | POA: Diagnosis not present

## 2017-04-27 IMAGING — DX DG CHEST 2V
2 series · 2 of 2 positions shown · non-contrast
Comparison: 10/06/2016

CLINICAL DATA: S/p CABG 10/03/2016, no chest complaints today, stat
reading, pt gone to office now for appt

EXAM:
CHEST  2 VIEW

[dg chest 2 view (1 of 2)]
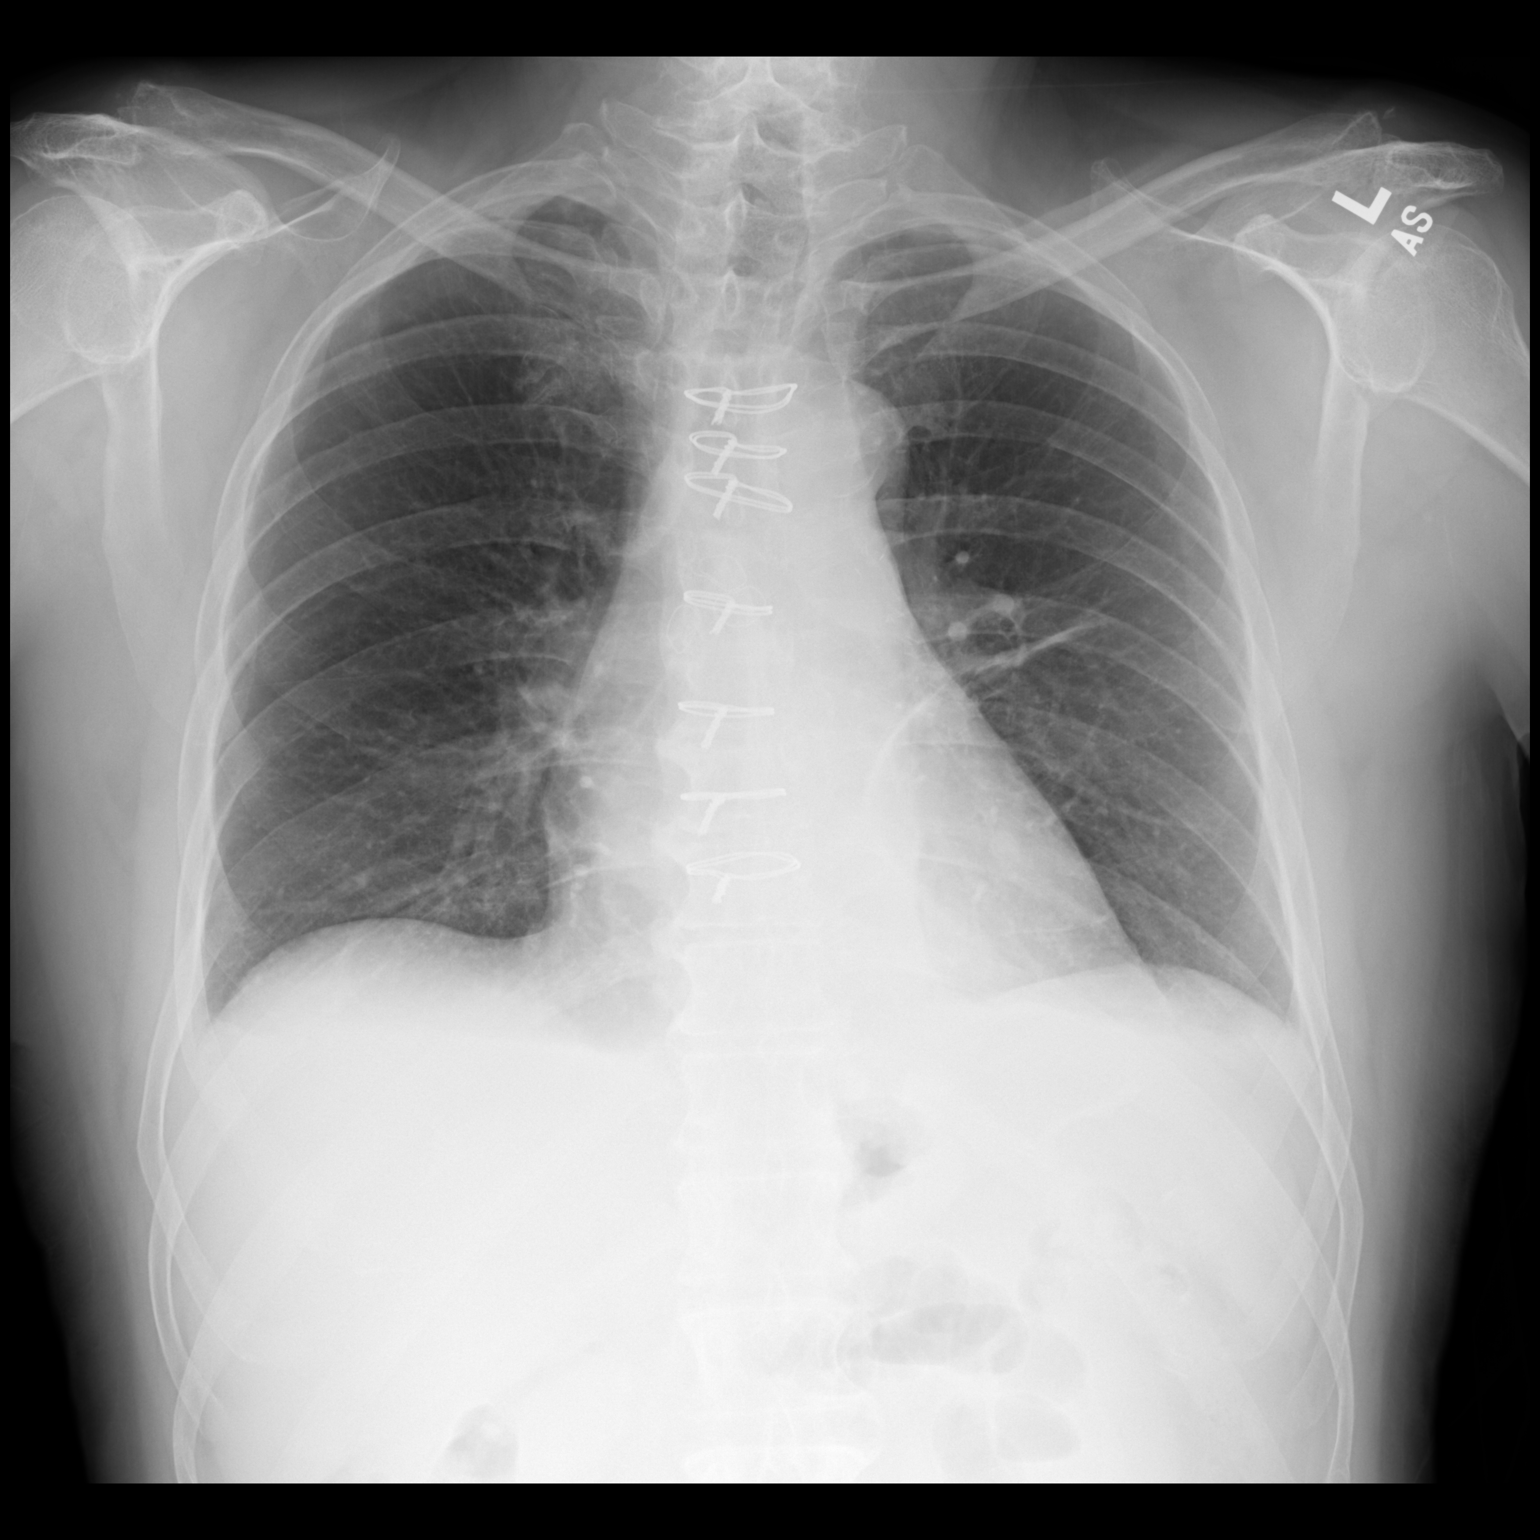

[dg chest 2 view (2 of 2)]
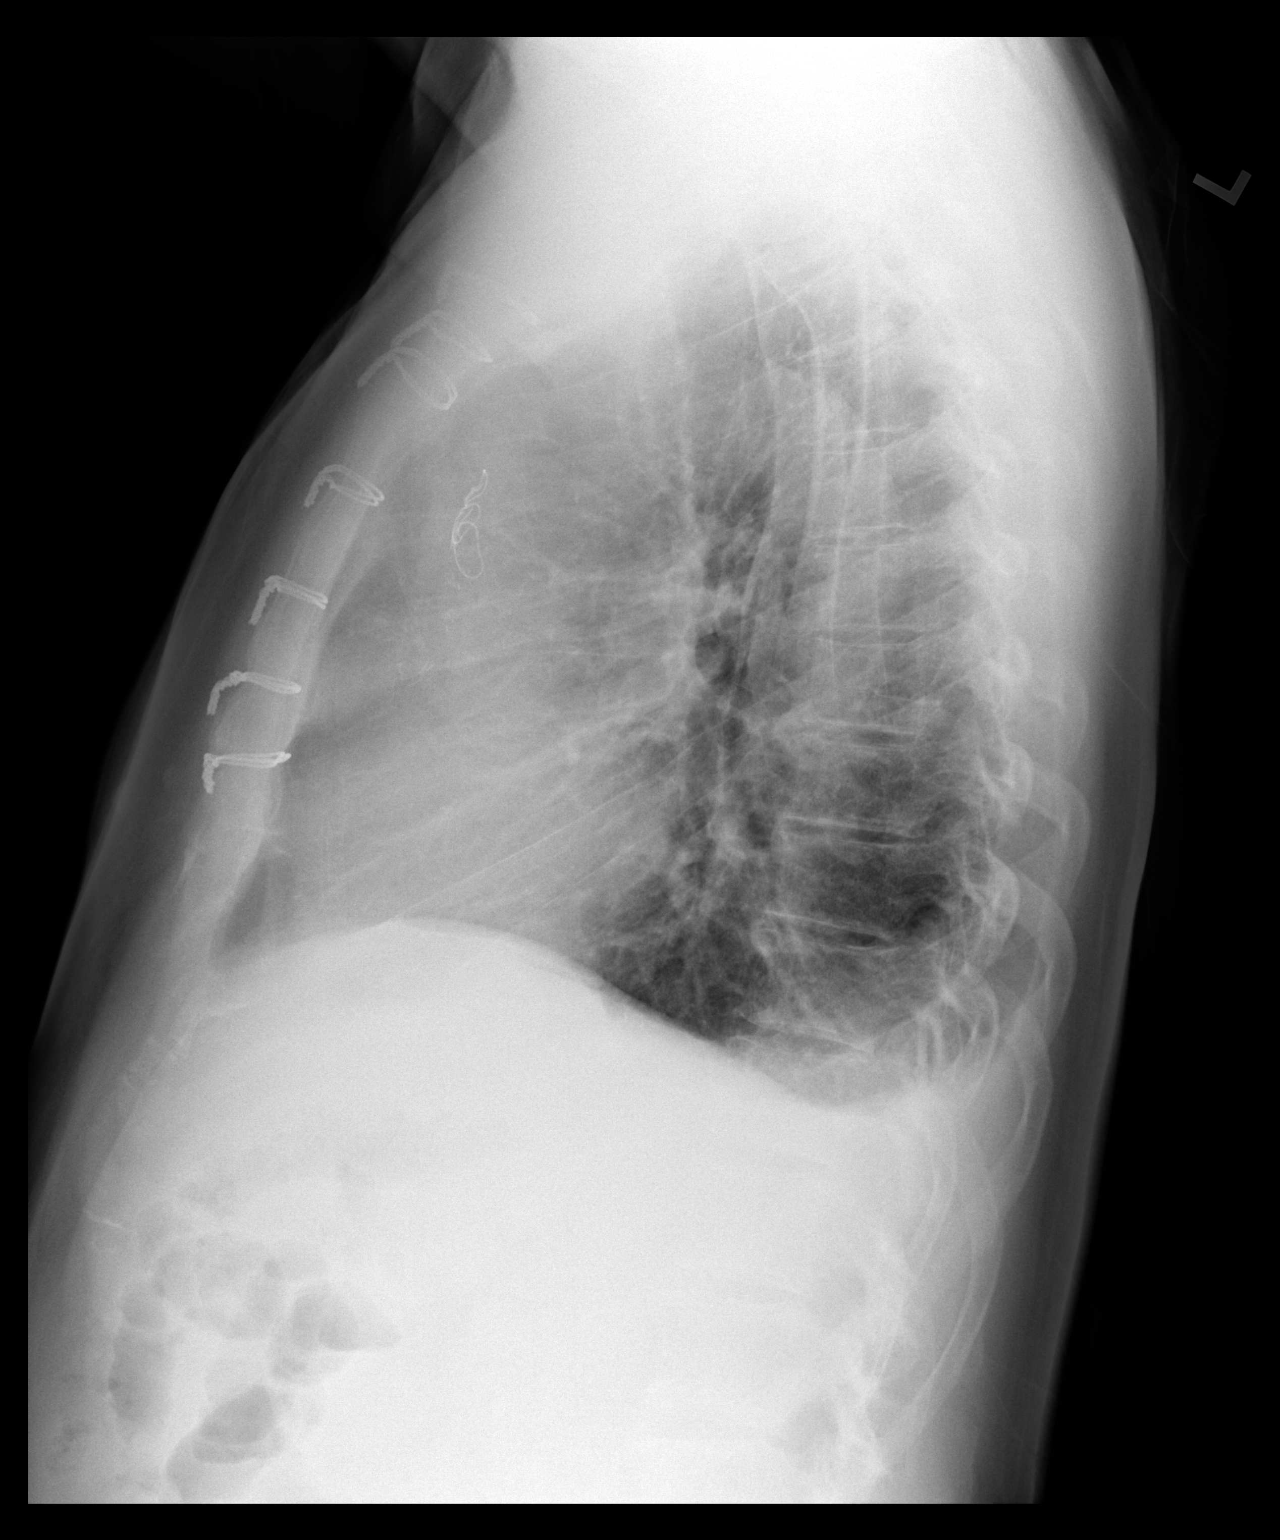

[2 of 2 positions shown; findings below may reference images not displayed]

FINDINGS: Midline trachea. Patient rotated minimally left. Prior median
sternotomy. Mild cardiomegaly with transverse aortic
atherosclerosis. Trace right pleural fluid is only readily apparent
on the lateral view. No pneumothorax. No congestive failure. Left
perihilar subsegmental atelectasis.
IMPRESSION: Small right pleural effusion.

Cardiomegaly without congestive failure.

## 2017-06-12 DIAGNOSIS — H353111 Nonexudative age-related macular degeneration, right eye, early dry stage: Secondary | ICD-10-CM | POA: Diagnosis not present

## 2017-06-12 DIAGNOSIS — T1501XA Foreign body in cornea, right eye, initial encounter: Secondary | ICD-10-CM | POA: Diagnosis not present

## 2017-06-12 DIAGNOSIS — H35373 Puckering of macula, bilateral: Secondary | ICD-10-CM | POA: Diagnosis not present

## 2017-06-16 DIAGNOSIS — T1501XD Foreign body in cornea, right eye, subsequent encounter: Secondary | ICD-10-CM | POA: Diagnosis not present

## 2017-06-26 ENCOUNTER — Encounter (INDEPENDENT_AMBULATORY_CARE_PROVIDER_SITE_OTHER): Payer: Medicare Other | Admitting: Ophthalmology

## 2017-06-26 DIAGNOSIS — H35033 Hypertensive retinopathy, bilateral: Secondary | ICD-10-CM | POA: Diagnosis not present

## 2017-06-26 DIAGNOSIS — H43813 Vitreous degeneration, bilateral: Secondary | ICD-10-CM

## 2017-06-26 DIAGNOSIS — H353111 Nonexudative age-related macular degeneration, right eye, early dry stage: Secondary | ICD-10-CM

## 2017-06-26 DIAGNOSIS — H35372 Puckering of macula, left eye: Secondary | ICD-10-CM | POA: Diagnosis not present

## 2017-06-26 DIAGNOSIS — H338 Other retinal detachments: Secondary | ICD-10-CM

## 2017-06-26 DIAGNOSIS — I10 Essential (primary) hypertension: Secondary | ICD-10-CM | POA: Diagnosis not present

## 2017-06-26 DIAGNOSIS — H353122 Nonexudative age-related macular degeneration, left eye, intermediate dry stage: Secondary | ICD-10-CM

## 2017-07-30 ENCOUNTER — Encounter: Payer: Self-pay | Admitting: Cardiovascular Disease

## 2017-07-30 ENCOUNTER — Ambulatory Visit (INDEPENDENT_AMBULATORY_CARE_PROVIDER_SITE_OTHER): Payer: Medicare Other | Admitting: Cardiovascular Disease

## 2017-07-30 VITALS — BP 120/74 | HR 54 | Ht 69.0 in | Wt 188.0 lb

## 2017-07-30 DIAGNOSIS — I10 Essential (primary) hypertension: Secondary | ICD-10-CM | POA: Diagnosis not present

## 2017-07-30 DIAGNOSIS — I2510 Atherosclerotic heart disease of native coronary artery without angina pectoris: Secondary | ICD-10-CM | POA: Diagnosis not present

## 2017-07-30 DIAGNOSIS — E7800 Pure hypercholesterolemia, unspecified: Secondary | ICD-10-CM

## 2017-07-30 NOTE — Progress Notes (Signed)
Chief Complaint  Patient presents with  . Follow-up    CAD   History of Present Illness: 66 yo male with history of HTN, HLD and CAD who is here today for cardiac follow up. He was admitted to [facility] November 2017 with an inferior STEMI. His sub-totally occluded RCA was treated with a bare metal stent. He was also found to have severe disease in the left main artery and Circumflex. He underwent 4V CABG (LIMA to LAD, SVG to Diagonal, SVG to OM, SVG to PDA) on 10/03/16 after he had completed 1 month of dual antiplatelet therapy post PCI/bare metal stent placement. LVEF 60-65% by echo November 2017.   He is here today for follow up. The patient denies any chest pain, dyspnea, palpitations, lower extremity edema, orthopnea, PND, dizziness, near syncope or syncope.    Primary Care Physician: Neale Burly, MD  Past Medical History:  Diagnosis Date  . Anginal pain ([place])   . Arthritis    "probably"  . BPH (benign prostatic hyperplasia)   . Coronary artery disease   . Dyspnea   . GERD (gastroesophageal reflux disease)   . History of kidney stones   . Hypertension   . Kidney stones   . Macular degeneration   . Myocardial infarction ([place])   . Seasonal allergies     Past Surgical History:  Procedure Laterality Date  . APPENDECTOMY    . BACK SURGERY    . CARDIAC CATHETERIZATION N/A 08/21/2016   Procedure: Left Heart Cath and Coronary Angiography;  Surgeon: Burnell Blanks, MD;  Location: [place] [facility];  Service: Cardiovascular;  Laterality: N/A;  . CARDIAC CATHETERIZATION N/A 08/21/2016   Procedure: Coronary Stent Intervention;  Surgeon: Burnell Blanks, MD;  Location: [place] [facility];  Service: Cardiovascular;  Laterality: N/A;  . CATARACT EXTRACTION W/ INTRAOCULAR LENS  IMPLANT, BILATERAL Bilateral   . COLONOSCOPY    . CORONARY ARTERY BYPASS GRAFT N/A 10/03/2016   Procedure: CORONARY ARTERY BYPASS GRAFTING (CABG)x 4 with endoscopic harvesting of right  saphenous vein ,LIMA-LAD SVG-DIAG SVG-OM SVG-RCA;  Surgeon: Gaye Pollack, MD;  Location: [place];  Service: Open Heart Surgery;  Laterality: N/A;  . EYE SURGERY    . GAS INSERTION Right 02/05/2016   Procedure: INSERTION OF GAS-C3F8;  Surgeon: Hayden Pedro, MD;  Location: [place];  Service: Ophthalmology;  Laterality: Right;  . HAND SURGERY Right    thumb; S/P "cut his arm"  . LASER PHOTO ABLATION Right 02/05/2016   Procedure: LASER PHOTO ABLATION-HEADSCOPE LASER;  Surgeon: Hayden Pedro, MD;  Location: [place];  Service: Ophthalmology;  Laterality: Right;  . LUMBAR DISC SURGERY  ~ 2002  . RETINAL DETACHMENT REPAIR W/ SCLERAL BUCKLE LE Right 02/05/2016  . SCLERAL BUCKLE Right 02/05/2016   Procedure: SCLERAL BUCKLE;  Surgeon: Hayden Pedro, MD;  Location: [place];  Service: Ophthalmology;  Laterality: Right;  . TEE WITHOUT CARDIOVERSION N/A 10/03/2016   Procedure: TRANSESOPHAGEAL ECHOCARDIOGRAM (TEE);  Surgeon: Gaye Pollack, MD;  Location: [place];  Service: Open Heart Surgery;  Laterality: N/A;  . TONSILLECTOMY      Current Outpatient Prescriptions  Medication Sig Dispense Refill  . aspirin EC 81 MG tablet Take 1 tablet (81 mg total) by mouth daily.    [name] atorvastatin (LIPITOR) 80 MG tablet Take 1 tablet (80 mg total) by mouth daily at 6 PM. 90 tablet 2  . cetirizine (ZYRTEC) 10 MG tablet Take 10 mg by mouth daily at  12 noon.     . clopidogrel (PLAVIX) 75 MG tablet Take 1 tablet (75 mg total) by mouth daily. 30 tablet 1  . metoprolol tartrate (LOPRESSOR) 25 MG tablet Take 1 tablet (25 mg total) by mouth 2 (two) times daily. 60 tablet 11  . Multiple Vitamins-Minerals (ICAPS MV PO) Take 1 tablet by mouth 2 (two) times daily.     . pantoprazole (PROTONIX) 40 MG tablet Take 40 mg by mouth daily.    . Polyvinyl Alcohol-Povidone (REFRESH OP) Apply 1 drop to eye 4 (four) times daily as needed (dry eyes).    . ramipril (ALTACE) 5 MG capsule Take 5 mg by mouth daily.    . tamsulosin (FLOMAX) 0.4 MG CAPS  capsule Take 0.4 mg by mouth daily.    . traMADol (ULTRAM) 50 MG tablet Take 1 tablet (50 mg total) by mouth every 4 (four) hours as needed for moderate pain. 30 tablet 0   No current facility-administered medications for this visit.     Allergies  Allergen Reactions  . No Known Allergies     Social History   Social History  . Marital status: Married    Spouse name: N/A  . Number of children: N/A  . Years of education: N/A   Occupational History  . Not on file.   Social History Main Topics  . Smoking status: Former Smoker    Packs/day: 3.00    Years: 16.00    Types: Cigarettes    Quit date: 02/04/1979  . Smokeless tobacco: Never Used  . Alcohol use Yes     Comment: 02/06/2016 "a beer a couple times/month"  . Drug use: No  . Sexual activity: Not Currently   Other Topics Concern  . Not on file   Social History Narrative  . No narrative on file    Family History  Problem Relation Age of Onset  . Hypertension Mother   . Congestive Heart Failure Father     Review of Systems:  As stated in the HPI and otherwise negative.   BP 120/74   Pulse (!) 54   Ht 5\' 9"  (1.753 m)   Wt 188 lb (85.3 kg)   SpO2 97%   BMI 27.76 kg/m   Physical Examination:  General: Well developed, well nourished, NAD  HEENT: OP clear, mucus membranes moist  SKIN: warm, dry. No rashes. Neuro: No focal deficits  Musculoskeletal: Muscle strength 5/5 all ext  Psychiatric: Mood and affect normal  Neck: No JVD, no carotid bruits, no thyromegaly, no lymphadenopathy.  Lungs:Clear bilaterally, no wheezes, rhonci, crackles Cardiovascular: Regular rate and rhythm. No murmurs, gallops or rubs. Abdomen:Soft. Bowel sounds present. Non-tender.  Extremities: No lower extremity edema. Pulses are 2 + in the bilateral DP/PT.  EKG:  EKG is ordered today. The ekg ordered today demonstrates Sinus brady, rate 54 bpm. Non-specific ST abnormality.   Recent Labs: 10/01/2016: ALT 22 10/04/2016: Magnesium  2.5 10/06/2016: BUN 16; Creatinine, Ser 0.85; Hemoglobin 8.3; Platelets 143; Potassium 4.0; Sodium 136   Lipid Panel Followed in primary care.    Wt Readings from Last 3 Encounters:  07/30/17 188 lb (85.3 kg)  03/06/17 206 lb 8.4 oz (93.7 kg)  01/22/17 189 lb 12.8 oz (86.1 kg)     Other studies Reviewed: Additional studies/ records that were reviewed today include: . Review of the above records demonstrates:   Assessment and Plan:   1. CAD s/p CABG without angina: No chest pain. He had 4V CABG in December 2017. Will  continue ASA, Plavix, statin and beta blocker. I will see him back in 6 months and we may stop Plavix at that time.   2. HTN:  BP is controlled. NO changes.   3. Hyperlipidemia: Lipids controlled in primary care. Continue statin.   Current medicines are reviewed at length with the patient today.  The patient does not have concerns regarding medicines.  The following changes have been made:  no change  Labs/ tests ordered today include:  No orders of the defined types were placed in this encounter.   Disposition:   FU with me  in 12  months  Signed, Lauree Chandler, MD 07/30/2017 9:33 AM    [facility] [address], [place], [place]  [address] Phone: [phone]; Fax: [phone]

## 2017-07-30 NOTE — Patient Instructions (Signed)

## 2017-08-03 DIAGNOSIS — Z7952 Long term (current) use of systemic steroids: Secondary | ICD-10-CM | POA: Diagnosis not present

## 2017-08-03 DIAGNOSIS — M810 Age-related osteoporosis without current pathological fracture: Secondary | ICD-10-CM | POA: Diagnosis not present

## 2017-08-03 NOTE — Addendum Note (Signed)
Addended by: Mendel Ryder on: 08/03/2017 07:50 AM   Modules accepted: Orders

## 2017-08-06 DIAGNOSIS — I10 Essential (primary) hypertension: Secondary | ICD-10-CM | POA: Diagnosis not present

## 2017-08-06 DIAGNOSIS — N4289 Other specified disorders of prostate: Secondary | ICD-10-CM | POA: Diagnosis not present

## 2017-08-06 DIAGNOSIS — I25798 Atherosclerosis of other coronary artery bypass graft(s) with other forms of angina pectoris: Secondary | ICD-10-CM | POA: Diagnosis not present

## 2017-08-06 DIAGNOSIS — K210 Gastro-esophageal reflux disease with esophagitis: Secondary | ICD-10-CM | POA: Diagnosis not present

## 2017-08-07 DIAGNOSIS — I10 Essential (primary) hypertension: Secondary | ICD-10-CM | POA: Diagnosis not present

## 2017-08-07 DIAGNOSIS — N4289 Other specified disorders of prostate: Secondary | ICD-10-CM | POA: Diagnosis not present

## 2017-08-07 DIAGNOSIS — I25798 Atherosclerosis of other coronary artery bypass graft(s) with other forms of angina pectoris: Secondary | ICD-10-CM | POA: Diagnosis not present

## 2017-08-07 DIAGNOSIS — K210 Gastro-esophageal reflux disease with esophagitis: Secondary | ICD-10-CM | POA: Diagnosis not present

## 2017-11-17 DIAGNOSIS — I10 Essential (primary) hypertension: Secondary | ICD-10-CM | POA: Diagnosis not present

## 2017-11-17 DIAGNOSIS — N4289 Other specified disorders of prostate: Secondary | ICD-10-CM | POA: Diagnosis not present

## 2017-11-17 DIAGNOSIS — I25798 Atherosclerosis of other coronary artery bypass graft(s) with other forms of angina pectoris: Secondary | ICD-10-CM | POA: Diagnosis not present

## 2017-11-17 DIAGNOSIS — K210 Gastro-esophageal reflux disease with esophagitis: Secondary | ICD-10-CM | POA: Diagnosis not present

## 2018-01-24 NOTE — Progress Notes (Signed)
Chief Complaint  Patient presents with  . Follow-up    CAD   History of Present Illness: 67 yo male with history of HTN, HLD and CAD who is here today for cardiac follow up. He was admitted to [facility] November 2017 with an inferior STEMI. His sub-totally occluded RCA was treated with a bare metal stent. He was also found to have severe disease in the left main artery and Circumflex. He underwent 4V CABG (LIMA to LAD, SVG to Diagonal, SVG to OM, SVG to PDA) on 10/03/16 after he had completed 1 month of dual antiplatelet therapy post PCI/bare metal stent placement. LVEF 60-65% by echo November 2017.   He is here today for follow up. The patient denies any chest pain, dyspnea, palpitations, lower extremity edema, orthopnea, PND, dizziness, near syncope or syncope.   Primary Care Physician: Neale Burly, MD  Past Medical History:  Diagnosis Date  . Anginal pain ([place])   . Arthritis    "probably"  . BPH (benign prostatic hyperplasia)   . Coronary artery disease   . Dyspnea   . GERD (gastroesophageal reflux disease)   . History of kidney stones   . Hypertension   . Kidney stones   . Macular degeneration   . Myocardial infarction ([place])   . Seasonal allergies     Past Surgical History:  Procedure Laterality Date  . APPENDECTOMY    . BACK SURGERY    . CARDIAC CATHETERIZATION N/A 08/21/2016   Procedure: Left Heart Cath and Coronary Angiography;  Surgeon: Burnell Blanks, MD;  Location: [place] [facility];  Service: Cardiovascular;  Laterality: N/A;  . CARDIAC CATHETERIZATION N/A 08/21/2016   Procedure: Coronary Stent Intervention;  Surgeon: Burnell Blanks, MD;  Location: [place] [facility];  Service: Cardiovascular;  Laterality: N/A;  . CATARACT EXTRACTION W/ INTRAOCULAR LENS  IMPLANT, BILATERAL Bilateral   . COLONOSCOPY    . CORONARY ARTERY BYPASS GRAFT N/A 10/03/2016   Procedure: CORONARY ARTERY BYPASS GRAFTING (CABG)x 4 with endoscopic harvesting of right saphenous  vein ,LIMA-LAD SVG-DIAG SVG-OM SVG-RCA;  Surgeon: Gaye Pollack, MD;  Location: [place];  Service: Open Heart Surgery;  Laterality: N/A;  . EYE SURGERY    . GAS INSERTION Right 02/05/2016   Procedure: INSERTION OF GAS-C3F8;  Surgeon: Hayden Pedro, MD;  Location: [place];  Service: Ophthalmology;  Laterality: Right;  . HAND SURGERY Right    thumb; S/P "cut his arm"  . LASER PHOTO ABLATION Right 02/05/2016   Procedure: LASER PHOTO ABLATION-HEADSCOPE LASER;  Surgeon: Hayden Pedro, MD;  Location: [place];  Service: Ophthalmology;  Laterality: Right;  . LUMBAR DISC SURGERY  ~ 2002  . RETINAL DETACHMENT REPAIR W/ SCLERAL BUCKLE LE Right 02/05/2016  . SCLERAL BUCKLE Right 02/05/2016   Procedure: SCLERAL BUCKLE;  Surgeon: Hayden Pedro, MD;  Location: [place];  Service: Ophthalmology;  Laterality: Right;  . TEE WITHOUT CARDIOVERSION N/A 10/03/2016   Procedure: TRANSESOPHAGEAL ECHOCARDIOGRAM (TEE);  Surgeon: Gaye Pollack, MD;  Location: [place];  Service: Open Heart Surgery;  Laterality: N/A;  . TONSILLECTOMY      Current Outpatient Medications  Medication Sig Dispense Refill  . aspirin EC 81 MG tablet Take 1 tablet (81 mg total) by mouth daily.    . Loratadine-Pseudoephedrine (CLARITIN-D 24 HOUR PO) Take 1 tablet by mouth daily. Pt unsure of dosage    . metoprolol tartrate (LOPRESSOR) 25 MG tablet Take 1 tablet (25 mg total) by mouth 2 (two)  times daily. 60 tablet 11  . Multiple Vitamins-Minerals (ICAPS MV PO) Take 1 tablet by mouth 2 (two) times daily.     . pantoprazole (PROTONIX) 40 MG tablet Take 40 mg by mouth daily.    . Polyvinyl Alcohol-Povidone (REFRESH OP) Apply 1 drop to eye 4 (four) times daily as needed (dry eyes).    . ramipril (ALTACE) 5 MG capsule Take 5 mg by mouth daily.    . tamsulosin (FLOMAX) 0.4 MG CAPS capsule Take 0.4 mg by mouth daily.    . traMADol (ULTRAM) 50 MG tablet Take 1 tablet (50 mg total) by mouth every 4 (four) hours as needed for moderate pain. 30 tablet 0  .  atorvastatin (LIPITOR) 40 MG tablet Take 1 tablet (40 mg total) by mouth daily. 90 tablet 3   No current facility-administered medications for this visit.     Allergies  Allergen Reactions  . No Known Allergies     Social History   Socioeconomic History  . Marital status: Married    Spouse name: Not on file  . Number of children: Not on file  . Years of education: Not on file  . Highest education level: Not on file  Occupational History  . Not on file  Social Needs  . Financial resource strain: Not on file  . Food insecurity:    Worry: Not on file    Inability: Not on file  . Transportation needs:    Medical: Not on file    Non-medical: Not on file  Tobacco Use  . Smoking status: Former Smoker    Packs/day: 3.00    Years: 16.00    Pack years: 48.00    Types: Cigarettes    Last attempt to quit: 02/04/1979    Years since quitting: 39.0  . Smokeless tobacco: Never Used  Substance and Sexual Activity  . Alcohol use: Yes    Comment: 02/06/2016 "a beer a couple times/month"  . Drug use: No  . Sexual activity: Not Currently  Lifestyle  . Physical activity:    Days per week: Not on file    Minutes per session: Not on file  . Stress: Not on file  Relationships  . Social connections:    Talks on phone: Not on file    Gets together: Not on file    Attends religious service: Not on file    Active member of club or organization: Not on file    Attends meetings of clubs or organizations: Not on file    Relationship status: Not on file  . Intimate partner violence:    Fear of current or ex partner: Not on file    Emotionally abused: Not on file    Physically abused: Not on file    Forced sexual activity: Not on file  Other Topics Concern  . Not on file  Social History Narrative  . Not on file    Family History  Problem Relation Age of Onset  . Hypertension Mother   . Congestive Heart Failure Father     Review of Systems:  As stated in the HPI and otherwise  negative.   BP 116/64   Pulse 66   Ht 5\' 9"  (1.753 m)   Wt 198 lb (89.8 kg)   SpO2 98%   BMI 29.24 kg/m   Physical Examination:  General: Well developed, well nourished, NAD  HEENT: OP clear, mucus membranes moist  SKIN: warm, dry. No rashes. Neuro: No focal deficits  Musculoskeletal: Muscle strength  5/5 all ext  Psychiatric: Mood and affect normal  Neck: No JVD, no carotid bruits, no thyromegaly, no lymphadenopathy.  Lungs:Clear bilaterally, no wheezes, rhonci, crackles Cardiovascular: Regular rate and rhythm. No murmurs, gallops or rubs. Abdomen:Soft. Bowel sounds present. Non-tender.  Extremities: No lower extremity edema. Pulses are 2 + in the bilateral DP/PT.  EKG:  EKG is not ordered today. The ekg ordered today demonstrates   Recent Labs: No results found for requested labs within last 8760 hours.   Lipid Panel Followed in primary care.    Wt Readings from Last 3 Encounters:  01/25/18 198 lb (89.8 kg)  07/30/17 188 lb (85.3 kg)  03/06/17 206 lb 8.4 oz (93.7 kg)     Other studies Reviewed: Additional studies/ records that were reviewed today include: . Review of the above records demonstrates:   Assessment and Plan:   1. CAD s/p CABG without angina: He had 4V CABG in December 2017. He is doing well. No chest pain. Will continue ASA, statin and beta blocker. Stop Plavix today since he is over a year out from his bypass and MI.   2. HTN:  BP is controlled. No changes today.   3. Hyperlipidemia: Lipids controlled in primary care. LDL 43 in November 2019. He will continue his Lipitor but I will lower it to 40 mg daily.   Current medicines are reviewed at length with the patient today.  The patient does not have concerns regarding medicines.  The following changes have been made:  no change  Labs/ tests ordered today include:  No orders of the defined types were placed in this encounter.   Disposition:   FU with me  in 12  months  Signed, Lauree Chandler, MD 01/25/2018 10:14 AM    [facility] [place], [place], [place]  [address] Phone: [phone]; Fax: [phone]

## 2018-01-25 ENCOUNTER — Ambulatory Visit: Payer: Medicare Other | Admitting: Cardiovascular Disease

## 2018-01-25 ENCOUNTER — Encounter: Payer: Self-pay | Admitting: Cardiovascular Disease

## 2018-01-25 VITALS — BP 116/64 | HR 66 | Ht 69.0 in | Wt 198.0 lb

## 2018-01-25 DIAGNOSIS — I10 Essential (primary) hypertension: Secondary | ICD-10-CM | POA: Diagnosis not present

## 2018-01-25 DIAGNOSIS — I2510 Atherosclerotic heart disease of native coronary artery without angina pectoris: Secondary | ICD-10-CM

## 2018-01-25 DIAGNOSIS — E7800 Pure hypercholesterolemia, unspecified: Secondary | ICD-10-CM | POA: Diagnosis not present

## 2018-01-25 MED ORDER — ATORVASTATIN CALCIUM 40 MG PO TABS: 40.0000 mg | ORAL_TABLET | Freq: Every day | ORAL | 3 refills | Status: DC

## 2018-01-25 NOTE — Patient Instructions (Signed)
Medication Instructions:  Your physician has recommended you make the following change in your medication:  Decrease Atorvastatin to 40 mg by mouth daily. Stop Clopidogrel   Labwork: none  Testing/Procedures: none  Follow-Up: Your physician recommends that you schedule a follow-up appointment in: 12 months. Please call our office in about 8 months to schedule this appointment    Any Other Special Instructions Will Be Listed Below (If Applicable).     If you need a refill on your cardiac medications before your next appointment, please call your pharmacy.

## 2018-02-01 ENCOUNTER — Ambulatory Visit: Payer: Medicare Other | Admitting: Cardiovascular Disease

## 2018-02-22 DIAGNOSIS — N4289 Other specified disorders of prostate: Secondary | ICD-10-CM | POA: Diagnosis not present

## 2018-02-22 DIAGNOSIS — Z125 Encounter for screening for malignant neoplasm of prostate: Secondary | ICD-10-CM | POA: Diagnosis not present

## 2018-02-22 DIAGNOSIS — I25798 Atherosclerosis of other coronary artery bypass graft(s) with other forms of angina pectoris: Secondary | ICD-10-CM | POA: Diagnosis not present

## 2018-02-22 DIAGNOSIS — I10 Essential (primary) hypertension: Secondary | ICD-10-CM | POA: Diagnosis not present

## 2018-02-22 DIAGNOSIS — K210 Gastro-esophageal reflux disease with esophagitis: Secondary | ICD-10-CM | POA: Diagnosis not present

## 2018-03-22 DIAGNOSIS — H40013 Open angle with borderline findings, low risk, bilateral: Secondary | ICD-10-CM | POA: Diagnosis not present

## 2018-04-06 DIAGNOSIS — H26492 Other secondary cataract, left eye: Secondary | ICD-10-CM | POA: Diagnosis not present

## 2018-04-06 DIAGNOSIS — H35372 Puckering of macula, left eye: Secondary | ICD-10-CM | POA: Diagnosis not present

## 2018-04-06 DIAGNOSIS — H40013 Open angle with borderline findings, low risk, bilateral: Secondary | ICD-10-CM | POA: Diagnosis not present

## 2018-04-06 DIAGNOSIS — Z961 Presence of intraocular lens: Secondary | ICD-10-CM | POA: Diagnosis not present

## 2018-06-01 DIAGNOSIS — K210 Gastro-esophageal reflux disease with esophagitis: Secondary | ICD-10-CM | POA: Diagnosis not present

## 2018-06-01 DIAGNOSIS — Z0000 Encounter for general adult medical examination without abnormal findings: Secondary | ICD-10-CM | POA: Diagnosis not present

## 2018-06-01 DIAGNOSIS — I25798 Atherosclerosis of other coronary artery bypass graft(s) with other forms of angina pectoris: Secondary | ICD-10-CM | POA: Diagnosis not present

## 2018-06-01 DIAGNOSIS — I10 Essential (primary) hypertension: Secondary | ICD-10-CM | POA: Diagnosis not present

## 2018-06-28 ENCOUNTER — Encounter (INDEPENDENT_AMBULATORY_CARE_PROVIDER_SITE_OTHER): Payer: Medicare Other | Admitting: Ophthalmology

## 2018-06-28 DIAGNOSIS — H35372 Puckering of macula, left eye: Secondary | ICD-10-CM

## 2018-06-28 DIAGNOSIS — H353122 Nonexudative age-related macular degeneration, left eye, intermediate dry stage: Secondary | ICD-10-CM | POA: Diagnosis not present

## 2018-06-28 DIAGNOSIS — H35033 Hypertensive retinopathy, bilateral: Secondary | ICD-10-CM | POA: Diagnosis not present

## 2018-06-28 DIAGNOSIS — I10 Essential (primary) hypertension: Secondary | ICD-10-CM | POA: Diagnosis not present

## 2018-06-28 DIAGNOSIS — H353111 Nonexudative age-related macular degeneration, right eye, early dry stage: Secondary | ICD-10-CM | POA: Diagnosis not present

## 2018-06-28 DIAGNOSIS — H338 Other retinal detachments: Secondary | ICD-10-CM

## 2018-06-28 DIAGNOSIS — H43813 Vitreous degeneration, bilateral: Secondary | ICD-10-CM

## 2018-08-16 ENCOUNTER — Other Ambulatory Visit: Payer: Self-pay

## 2018-08-16 NOTE — Patient Outreach (Signed)
[facility]) Care Management  08/16/2018  Scott Jacobson Jan 07, 1951 [id number]   Medication Adherence call to Mr. [name] spoke with patient he is due on Atorvastatin 40 mg patient has medication he said he has until the end of the month Mr. Minor is showing past due under [facility].   [facility] Direct Dial [phone]  Fax [phone] [email or web address]

## 2018-09-14 DIAGNOSIS — Z0000 Encounter for general adult medical examination without abnormal findings: Secondary | ICD-10-CM | POA: Diagnosis not present

## 2018-09-14 DIAGNOSIS — K210 Gastro-esophageal reflux disease with esophagitis: Secondary | ICD-10-CM | POA: Diagnosis not present

## 2018-09-14 DIAGNOSIS — I25798 Atherosclerosis of other coronary artery bypass graft(s) with other forms of angina pectoris: Secondary | ICD-10-CM | POA: Diagnosis not present

## 2018-09-14 DIAGNOSIS — I10 Essential (primary) hypertension: Secondary | ICD-10-CM | POA: Diagnosis not present

## 2018-11-08 DIAGNOSIS — H40013 Open angle with borderline findings, low risk, bilateral: Secondary | ICD-10-CM | POA: Diagnosis not present

## 2018-12-10 DIAGNOSIS — M25572 Pain in left ankle and joints of left foot: Secondary | ICD-10-CM | POA: Diagnosis not present

## 2018-12-10 DIAGNOSIS — I2510 Atherosclerotic heart disease of native coronary artery without angina pectoris: Secondary | ICD-10-CM | POA: Diagnosis not present

## 2018-12-10 DIAGNOSIS — Z955 Presence of coronary angioplasty implant and graft: Secondary | ICD-10-CM | POA: Diagnosis not present

## 2018-12-10 DIAGNOSIS — I252 Old myocardial infarction: Secondary | ICD-10-CM | POA: Diagnosis not present

## 2018-12-10 DIAGNOSIS — I10 Essential (primary) hypertension: Secondary | ICD-10-CM | POA: Diagnosis not present

## 2018-12-10 DIAGNOSIS — M25472 Effusion, left ankle: Secondary | ICD-10-CM | POA: Diagnosis not present

## 2018-12-10 DIAGNOSIS — K219 Gastro-esophageal reflux disease without esophagitis: Secondary | ICD-10-CM | POA: Diagnosis not present

## 2018-12-10 DIAGNOSIS — S8012XA Contusion of left lower leg, initial encounter: Secondary | ICD-10-CM | POA: Diagnosis not present

## 2018-12-10 DIAGNOSIS — X501XXA Overexertion from prolonged static or awkward postures, initial encounter: Secondary | ICD-10-CM | POA: Diagnosis not present

## 2018-12-10 DIAGNOSIS — Z87891 Personal history of nicotine dependence: Secondary | ICD-10-CM | POA: Diagnosis not present

## 2018-12-10 DIAGNOSIS — Z79899 Other long term (current) drug therapy: Secondary | ICD-10-CM | POA: Diagnosis not present

## 2018-12-10 DIAGNOSIS — Z7982 Long term (current) use of aspirin: Secondary | ICD-10-CM | POA: Diagnosis not present

## 2018-12-10 DIAGNOSIS — M7989 Other specified soft tissue disorders: Secondary | ICD-10-CM | POA: Diagnosis not present

## 2018-12-27 DIAGNOSIS — K210 Gastro-esophageal reflux disease with esophagitis: Secondary | ICD-10-CM | POA: Diagnosis not present

## 2018-12-27 DIAGNOSIS — Z0000 Encounter for general adult medical examination without abnormal findings: Secondary | ICD-10-CM | POA: Diagnosis not present

## 2018-12-27 DIAGNOSIS — Z1389 Encounter for screening for other disorder: Secondary | ICD-10-CM | POA: Diagnosis not present

## 2018-12-27 DIAGNOSIS — I10 Essential (primary) hypertension: Secondary | ICD-10-CM | POA: Diagnosis not present

## 2018-12-27 DIAGNOSIS — I25798 Atherosclerosis of other coronary artery bypass graft(s) with other forms of angina pectoris: Secondary | ICD-10-CM | POA: Diagnosis not present

## 2019-01-21 ENCOUNTER — Telehealth: Payer: Self-pay | Admitting: Cardiovascular Disease

## 2019-01-21 NOTE — Telephone Encounter (Signed)
PHONE/Doximity visit on 01/24/2019     Phone call to obtain consent   -  01/21/2019           Virtual Visit Pre-Appointment Phone Call  Steps For Call:  1. Confirm consent - "In the setting of the current Covid19 crisis, you are scheduled for a (phone or video) visit with your provider on (date) at (time).  Just as we do with many in-office visits, in order for you to participate in this visit, we must obtain consent.  If you'd like, I can send this to your mychart (if signed up) or email for you to review.  Otherwise, I can obtain your verbal consent now.  All virtual visits are billed to your insurance company just like a normal visit would be.  By agreeing to a virtual visit, we'd like you to understand that the technology does not allow for your provider to perform an examination, and thus may limit your provider's ability to fully assess your condition. If your provider identifies any concerns that need to be evaluated in person, we will make arrangements to do so.  Finally, though the technology is pretty good, we cannot assure that it will always work on either your or our end, and in the setting of a video visit, we may have to convert it to a phone-only visit.  In either situation, we cannot ensure that we have a secure connection.  Are you willing to proceed?" STAFF: Did the patient verbally acknowledge consent to telehealth visit? Document YES/NO here: YES  2. Confirm the BEST phone number to call the day of the visit by including in appointment notes  3. Give patient instructions for WebEx/MyChart download to smartphone as below or Doximity/Doxy.me if video visit (depending on what platform provider is using)  4. Advise patient to be prepared with their blood pressure, heart rate, weight, any heart rhythm information, their current medicines, and a piece of paper and pen handy for any instructions they may receive the day of their visit  5. Inform patient they will receive  a phone call 15 minutes prior to their appointment time (may be from unknown caller ID) so they should be prepared to answer  6. Confirm that appointment type is correct in Epic appointment notes (VIDEO vs PHONE)     TELEPHONE CALL NOTE  Scott Jacobson has been deemed a candidate for a follow-up tele-health visit to limit community exposure during the Covid-19 pandemic. I spoke with the patient via phone to ensure availability of phone/video source, confirm preferred email & phone number, and discuss instructions and expectations.  I reminded Scott Jacobson to be prepared with any vital sign and/or heart rhythm information that could potentially be obtained via home monitoring, at the time of his visit. I reminded Scott Jacobson to expect a phone call at the time of his visit if his visit.  Thayer Headings 01/21/2019 2:55 PM   INSTRUCTIONS FOR DOWNLOADING THE [place] APP TO SMARTPHONE  - If Apple, ask patient to go to App Store and type in WebEx in the search bar. [place] [employer], the blue/green circle. If Android, go to [employer] and type in [employer] in the search bar. The app is free but as with any other app downloads, their phone may require them to verify saved payment information or Apple/Android password.  - The patient does NOT have to create an account. - On the day of the visit, the assist will  walk the patient through joining the meeting with the meeting number/password.  INSTRUCTIONS FOR DOWNLOADING THE MYCHART APP TO SMARTPHONE  - The patient must first make sure to have activated MyChart and know their login information - If Apple, go to [employer] and type in MyChart in the search bar and download the app. If Android, ask patient to go to [employer] and type in [place] in the search bar and download the app. The app is free but as with any other app downloads, their phone may require them to verify saved payment information or Apple/Android password.  - The  patient will need to then log into the app with their MyChart username and password, and select [facility] as their healthcare provider to link the account. When it is time for your visit, go to the MyChart app, find appointments, and click Begin Video Visit. Be sure to Select Allow for your device to access the Microphone and Camera for your visit. You will then be connected, and your provider will be with you shortly.  **If they have any issues connecting, or need assistance please contact MyChart service desk [phone])**  **If using a computer, in order to ensure the best quality for their visit they will need to use either of the following Internet Browsers: [employer], or Google Chrome**  IF USING DOXIMITY or DOXY.ME - The patient will receive a link just prior to their visit, either by text or email (to be determined day of appointment depending on if it's doxy.me or Doximity).     FULL LENGTH CONSENT FOR TELE-HEALTH VISIT   I hereby voluntarily request, consent and authorize [facility] and its employed or contracted physicians, physician assistants, nurse practitioners or other licensed health care professionals (the Practitioner), to provide me with telemedicine health care services (the Services") as deemed necessary by the treating Practitioner. I acknowledge and consent to receive the Services by the Practitioner via telemedicine. I understand that the telemedicine visit will involve communicating with the Practitioner through live audiovisual communication technology and the disclosure of certain medical information by electronic transmission. I acknowledge that I have been given the opportunity to request an in-person assessment or other available alternative prior to the telemedicine visit and am voluntarily participating in the telemedicine visit.  I understand that I have the right to withhold or withdraw my consent to the use of telemedicine in the course  of my care at any time, without affecting my right to future care or treatment, and that the Practitioner or I may terminate the telemedicine visit at any time. I understand that I have the right to inspect all information obtained and/or recorded in the course of the telemedicine visit and may receive copies of available information for a reasonable fee.  I understand that some of the potential risks of receiving the Services via telemedicine include:   Delay or interruption in medical evaluation due to technological equipment failure or disruption;  Information transmitted may not be sufficient (e.g. poor resolution of images) to allow for appropriate medical decision making by the Practitioner; and/or   In rare instances, security protocols could fail, causing a breach of personal health information.  Furthermore, I acknowledge that it is my responsibility to provide information about my medical history, conditions and care that is complete and accurate to the best of my ability. I acknowledge that Practitioner's advice, recommendations, and/or decision may be based on factors not within their control, such as incomplete or inaccurate data  provided by me or distortions of diagnostic images or specimens that may result from electronic transmissions. I understand that the practice of medicine is not an exact science and that Practitioner makes no warranties or guarantees regarding treatment outcomes. I acknowledge that I will receive a copy of this consent concurrently upon execution via email to the email address I last provided but may also request a printed copy by calling the office of [facility].    I understand that my insurance will be billed for this visit.   I have read or had this consent read to me.  I understand the contents of this consent, which adequately explains the benefits and risks of the Services being provided via telemedicine.   I have been provided ample opportunity to  ask questions regarding this consent and the Services and have had my questions answered to my satisfaction.  I give my informed consent for the services to be provided through the use of telemedicine in my medical care  By participating in this telemedicine visit I agree to the above.

## 2019-01-24 ENCOUNTER — Other Ambulatory Visit: Payer: Self-pay

## 2019-01-24 ENCOUNTER — Telehealth (INDEPENDENT_AMBULATORY_CARE_PROVIDER_SITE_OTHER): Payer: Medicare Other | Admitting: Cardiovascular Disease

## 2019-01-24 ENCOUNTER — Encounter: Payer: Self-pay | Admitting: Cardiovascular Disease

## 2019-01-24 VITALS — BP 137/76 | HR 62 | Ht 69.0 in | Wt 194.0 lb

## 2019-01-24 DIAGNOSIS — E7800 Pure hypercholesterolemia, unspecified: Secondary | ICD-10-CM | POA: Diagnosis not present

## 2019-01-24 DIAGNOSIS — I2510 Atherosclerotic heart disease of native coronary artery without angina pectoris: Secondary | ICD-10-CM | POA: Diagnosis not present

## 2019-01-24 DIAGNOSIS — I10 Essential (primary) hypertension: Secondary | ICD-10-CM

## 2019-01-24 NOTE — Patient Instructions (Signed)
Medication Instructions:  Your physician recommends that you continue on your current medications as directed. Please refer to the Current Medication list given to you today.  If you need a refill on your cardiac medications before your next appointment, please call your pharmacy.   Lab work: None Ordered  If you have labs (blood work) drawn today and your tests are completely normal, you will receive your results only by: [name] MyChart Message (if you have MyChart) OR . A paper copy in the mail If you have any lab test that is abnormal or we need to change your treatment, we will call you to review the results.  Testing/Procedures: None ordered  Follow-Up: At [facility], you and your health needs are our priority.  As part of our continuing mission to provide you with exceptional heart care, we have created designated Provider Care Teams.  These Care Teams include your primary Cardiologist (physician) and Advanced Practice Providers (APPs -  Physician Assistants and Nurse Practitioners) who all work together to provide you with the care you need, when you need it. . You will need a follow up appointment in 1 year.  Please call our office 2 months in advance to schedule this appointment.  You may see Darlina Guys, MD or one of the following Advanced Practice Providers on your designated Care Team:   . Lyda Jester, PA-C . Dayna Dunn, PA-C . Ermalinda Barrios, PA-C  Any Other Special Instructions Will Be Listed Below (If Applicable).

## 2019-01-24 NOTE — Progress Notes (Signed)
Virtual Visit via Video Note   This visit type was conducted due to national recommendations for restrictions regarding the COVID-19 Pandemic (e.g. social distancing) in an effort to limit this patient's exposure and mitigate transmission in our community.  Due to his co-morbid illnesses, this patient is at least at moderate risk for complications without adequate follow up.  This format is felt to be most appropriate for this patient at this time.  All issues noted in this document were discussed and addressed.  A limited physical exam was performed with this format.  Please refer to the patient's chart for his consent to telehealth for [facility].   Evaluation Performed:  Follow-up visit  Date:  01/24/2019   ID:  Scott Jacobson, Scott Jacobson Aug 17, 1951, MRN [id number]  Patient Location: Home Provider Location: Home  PCP:  Scott Burly, MD  Cardiologist:  Scott Chandler, MD  Electrophysiologist:  None   Chief Complaint:  Follow up  History of Present Illness:    Scott Jacobson is a 68 y.o. male with history of HTN, HLD and CAD who is being seen today by virtual e-visit due to the Covid19 pandemic. He was admitted to [facility] November 2017 with an inferior STEMI. His sub-totally occluded RCA was treated with a bare metal stent. He was also found to have severe disease in the left main artery and Circumflex. He underwent 4V CABG (LIMA to LAD, SVG to Diagonal, SVG to OM, SVG to PDA) on 10/03/16 after he had completed 1 month of dual antiplatelet therapy post PCI/bare metal stent placement. LVEF 60-65% by echo November 2017.   He tells me today that he has been feeling well. He has no chest pain, dyspnea, LE edema.   The patient does not have symptoms concerning for COVID-19 infection (fever, chills, cough, or new shortness of breath).    Past Medical History:  Diagnosis Date  . Anginal pain ([place])   . Arthritis    "probably"  . BPH (benign prostatic hyperplasia)   . Coronary artery  disease   . Dyspnea   . GERD (gastroesophageal reflux disease)   . History of kidney stones   . Hypertension   . Kidney stones   . Macular degeneration   . Myocardial infarction ([place])   . Seasonal allergies    Past Surgical History:  Procedure Laterality Date  . APPENDECTOMY    . BACK SURGERY    . CARDIAC CATHETERIZATION N/A 08/21/2016   Procedure: Left Heart Cath and Coronary Angiography;  Surgeon: Burnell Blanks, MD;  Location: [facility] [place] [facility];  Service: Cardiovascular;  Laterality: N/A;  . CARDIAC CATHETERIZATION N/A 08/21/2016   Procedure: Coronary Stent Intervention;  Surgeon: Burnell Blanks, MD;  Location: [place] [facility];  Service: Cardiovascular;  Laterality: N/A;  . CATARACT EXTRACTION W/ INTRAOCULAR LENS  IMPLANT, BILATERAL Bilateral   . COLONOSCOPY    . CORONARY ARTERY BYPASS GRAFT N/A 10/03/2016   Procedure: CORONARY ARTERY BYPASS GRAFTING (CABG)x 4 with endoscopic harvesting of right saphenous vein ,LIMA-LAD SVG-DIAG SVG-OM SVG-RCA;  Surgeon: Gaye Pollack, MD;  Location: [facility];  Service: Open Heart Surgery;  Laterality: N/A;  . EYE SURGERY    . GAS INSERTION Right 02/05/2016   Procedure: INSERTION OF GAS-C3F8;  Surgeon: Hayden Pedro, MD;  Location: [facility];  Service: Ophthalmology;  Laterality: Right;  . HAND SURGERY Right    thumb; S/P "cut his arm"  . LASER PHOTO ABLATION Right 02/05/2016  Procedure: LASER PHOTO ABLATION-HEADSCOPE LASER;  Surgeon: Hayden Pedro, MD;  Location: [facility];  Service: Ophthalmology;  Laterality: Right;  . LUMBAR DISC SURGERY  ~ 2002  . RETINAL DETACHMENT REPAIR W/ SCLERAL BUCKLE LE Right 02/05/2016  . SCLERAL BUCKLE Right 02/05/2016   Procedure: SCLERAL BUCKLE;  Surgeon: Hayden Pedro, MD;  Location: [facility];  Service: Ophthalmology;  Laterality: Right;  . TEE WITHOUT CARDIOVERSION N/A 10/03/2016   Procedure: TRANSESOPHAGEAL ECHOCARDIOGRAM (TEE);  Surgeon: Gaye Pollack, MD;  Location: [facility];  Service: Open Heart Surgery;   Laterality: N/A;  . TONSILLECTOMY       Current Meds  Medication Sig  . aspirin EC 81 MG tablet Take 1 tablet (81 mg total) by mouth daily.  [name] atorvastatin (LIPITOR) 40 MG tablet Take 1 tablet (40 mg total) by mouth daily.  . Loratadine-Pseudoephedrine (CLARITIN-D 24 HOUR PO) Take 1 tablet by mouth daily. Pt unsure of dosage  . metoprolol tartrate (LOPRESSOR) 25 MG tablet Take 1 tablet (25 mg total) by mouth 2 (two) times daily.  . Multiple Vitamins-Minerals (ICAPS MV PO) Take 1 tablet by mouth 2 (two) times daily.   . pantoprazole (PROTONIX) 40 MG tablet Take 40 mg by mouth daily.  . Polyvinyl Alcohol-Povidone (REFRESH OP) Apply 1 drop to eye 4 (four) times daily as needed (dry eyes).  . ramipril (ALTACE) 5 MG capsule Take 5 mg by mouth daily.  . tamsulosin (FLOMAX) 0.4 MG CAPS capsule Take 0.4 mg by mouth daily.  . [DISCONTINUED] traMADol (ULTRAM) 50 MG tablet Take 1 tablet (50 mg total) by mouth every 4 (four) hours as needed for moderate pain.     Allergies:   No known allergies   Social History   Tobacco Use  . Smoking status: Former Smoker    Packs/day: 3.00    Years: 16.00    Pack years: 48.00    Types: Cigarettes    Last attempt to quit: 02/04/1979    Years since quitting: 39.9  . Smokeless tobacco: Never Used  Substance Use Topics  . Alcohol use: Yes    Comment: 02/06/2016 "a beer a couple times/month"  . Drug use: No     Family Hx: The patient's family history includes Congestive Heart Failure in his father; Hypertension in his mother.  ROS:   Please see the history of present illness.    All other systems reviewed and are negative.   Prior CV studies:   The following studies were reviewed today:    Labs/Other Tests and Data Reviewed:    EKG:  No ECG reviewed.  Recent Labs: No results found for requested labs within last 8760 hours.   Recent Lipid Panel Lab Results  Component Value Date/Time   CHOL 190 08/21/2016 04:40 PM   TRIG 19 08/21/2016 04:40  PM   HDL 58 08/21/2016 04:40 PM   CHOLHDL 3.3 08/21/2016 04:40 PM   LDLCALC 128 (H) 08/21/2016 04:40 PM    Wt Readings from Last 3 Encounters:  01/24/19 194 lb (88 kg)  01/25/18 198 lb (89.8 kg)  07/30/17 188 lb (85.3 kg)     Objective:    Vital Signs:  BP 137/76   Pulse 62   Ht 5\' 9"  (1.753 m)   Wt 194 lb (88 kg)   SpO2 95%   BMI 28.65 kg/m    No exam due to telephone visit.   ASSESSMENT & PLAN:    1. CAD s/p CABG without angina: He had 4V CABG in  December 2017. No chest pain. Continue ASA, statin and beta blocker.    2. HTN:  BP has been controlled at home. No changes  3. Hyperlipidemia: Lipids controlled in primary care. Continue statin  COVID-19 Education: The signs and symptoms of COVID-19 were discussed with the patient and how to seek care for testing (follow up with PCP or arrange E-visit).  The importance of social distancing was discussed today.  Time:   Today, I have spent 15 minutes with the patient with telehealth technology discussing the above problems.     Medication Adjustments/Labs and Tests Ordered: Current medicines are reviewed at length with the patient today.  Concerns regarding medicines are outlined above.   Tests Ordered: No orders of the defined types were placed in this encounter.   Medication Changes: No orders of the defined types were placed in this encounter.   Disposition:  Follow up in 1 year(s)  Signed, Scott Chandler, MD  01/24/2019 3:19 PM    [facility]

## 2019-01-27 ENCOUNTER — Ambulatory Visit: Payer: Medicare Other | Admitting: Cardiovascular Disease

## 2019-02-20 ENCOUNTER — Other Ambulatory Visit: Payer: Self-pay | Admitting: Cardiovascular Disease

## 2019-03-30 DIAGNOSIS — I10 Essential (primary) hypertension: Secondary | ICD-10-CM | POA: Diagnosis not present

## 2019-03-30 DIAGNOSIS — K210 Gastro-esophageal reflux disease with esophagitis: Secondary | ICD-10-CM | POA: Diagnosis not present

## 2019-03-30 DIAGNOSIS — I25798 Atherosclerosis of other coronary artery bypass graft(s) with other forms of angina pectoris: Secondary | ICD-10-CM | POA: Diagnosis not present

## 2019-06-29 ENCOUNTER — Other Ambulatory Visit: Payer: Self-pay

## 2019-06-29 ENCOUNTER — Encounter (INDEPENDENT_AMBULATORY_CARE_PROVIDER_SITE_OTHER): Payer: Medicare Other | Admitting: Ophthalmology

## 2019-06-29 DIAGNOSIS — H353132 Nonexudative age-related macular degeneration, bilateral, intermediate dry stage: Secondary | ICD-10-CM

## 2019-06-29 DIAGNOSIS — H43813 Vitreous degeneration, bilateral: Secondary | ICD-10-CM

## 2019-06-29 DIAGNOSIS — H338 Other retinal detachments: Secondary | ICD-10-CM | POA: Diagnosis not present

## 2019-06-29 DIAGNOSIS — H35033 Hypertensive retinopathy, bilateral: Secondary | ICD-10-CM | POA: Diagnosis not present

## 2019-06-29 DIAGNOSIS — I10 Essential (primary) hypertension: Secondary | ICD-10-CM | POA: Diagnosis not present

## 2019-07-06 DIAGNOSIS — Z0000 Encounter for general adult medical examination without abnormal findings: Secondary | ICD-10-CM | POA: Diagnosis not present

## 2019-07-06 DIAGNOSIS — I25798 Atherosclerosis of other coronary artery bypass graft(s) with other forms of angina pectoris: Secondary | ICD-10-CM | POA: Diagnosis not present

## 2019-07-06 DIAGNOSIS — I10 Essential (primary) hypertension: Secondary | ICD-10-CM | POA: Diagnosis not present

## 2019-07-06 DIAGNOSIS — K210 Gastro-esophageal reflux disease with esophagitis: Secondary | ICD-10-CM | POA: Diagnosis not present

## 2019-09-05 DIAGNOSIS — H40013 Open angle with borderline findings, low risk, bilateral: Secondary | ICD-10-CM | POA: Diagnosis not present

## 2019-09-05 DIAGNOSIS — H524 Presbyopia: Secondary | ICD-10-CM | POA: Diagnosis not present

## 2019-10-12 DIAGNOSIS — I25798 Atherosclerosis of other coronary artery bypass graft(s) with other forms of angina pectoris: Secondary | ICD-10-CM | POA: Diagnosis not present

## 2019-10-12 DIAGNOSIS — Z1389 Encounter for screening for other disorder: Secondary | ICD-10-CM | POA: Diagnosis not present

## 2019-10-12 DIAGNOSIS — I10 Essential (primary) hypertension: Secondary | ICD-10-CM | POA: Diagnosis not present

## 2019-10-12 DIAGNOSIS — K2100 Gastro-esophageal reflux disease with esophagitis, without bleeding: Secondary | ICD-10-CM | POA: Diagnosis not present

## 2019-10-12 DIAGNOSIS — Z0000 Encounter for general adult medical examination without abnormal findings: Secondary | ICD-10-CM | POA: Diagnosis not present

## 2020-01-19 DIAGNOSIS — K2100 Gastro-esophageal reflux disease with esophagitis, without bleeding: Secondary | ICD-10-CM | POA: Diagnosis not present

## 2020-01-19 DIAGNOSIS — I10 Essential (primary) hypertension: Secondary | ICD-10-CM | POA: Diagnosis not present

## 2020-01-19 DIAGNOSIS — Z0000 Encounter for general adult medical examination without abnormal findings: Secondary | ICD-10-CM | POA: Diagnosis not present

## 2020-01-19 DIAGNOSIS — I25798 Atherosclerosis of other coronary artery bypass graft(s) with other forms of angina pectoris: Secondary | ICD-10-CM | POA: Diagnosis not present

## 2020-01-27 ENCOUNTER — Telehealth: Payer: Self-pay | Admitting: Cardiovascular Disease

## 2020-01-27 NOTE — Telephone Encounter (Signed)
Noted.  Called back to inform it is okay for her to come if needed. Left VM stating such.

## 2020-01-27 NOTE — Telephone Encounter (Signed)
Patient is requesting to have his wife come with him to his app on 02/01/20 with Dr. Angelena Form.

## 2020-02-01 ENCOUNTER — Other Ambulatory Visit: Payer: Self-pay

## 2020-02-01 ENCOUNTER — Encounter: Payer: Self-pay | Admitting: Cardiovascular Disease

## 2020-02-01 ENCOUNTER — Ambulatory Visit: Payer: Medicare Other | Admitting: Cardiovascular Disease

## 2020-02-01 VITALS — BP 122/74 | HR 68 | Ht 69.0 in | Wt 200.1 lb

## 2020-02-01 DIAGNOSIS — I2510 Atherosclerotic heart disease of native coronary artery without angina pectoris: Secondary | ICD-10-CM

## 2020-02-01 DIAGNOSIS — E7800 Pure hypercholesterolemia, unspecified: Secondary | ICD-10-CM

## 2020-02-01 DIAGNOSIS — I10 Essential (primary) hypertension: Secondary | ICD-10-CM

## 2020-02-01 NOTE — Patient Instructions (Signed)
Medication Instructions:  No changes *If you need a refill on your cardiac medications before your next appointment, please call your pharmacy*   Lab Work: none If you have labs (blood work) drawn today and your tests are completely normal, you will receive your results only by: [name] MyChart Message (if you have MyChart) OR . A paper copy in the mail If you have any lab test that is abnormal or we need to change your treatment, we will call you to review the results.   Testing/Procedures: none   Follow-Up: At [facility], you and your health needs are our priority.  As part of our continuing mission to provide you with exceptional heart care, we have created designated Provider Care Teams.  These Care Teams include your primary Cardiologist (physician) and Advanced Practice Providers (APPs -  Physician Assistants and Nurse Practitioners) who all work together to provide you with the care you need, when you need it.  We recommend signing up for the patient portal called "MyChart".  Sign up information is provided on this After Visit Summary.  MyChart is used to connect with patients for Virtual Visits (Telemedicine).  Patients are able to view lab/test results, encounter notes, upcoming appointments, etc.  Non-urgent messages can be sent to your provider as well.   To learn more about what you can do with MyChart, go to [email or web address].    Your next appointment:   12 month(s)  The format for your next appointment:   Either In Person or Virtual  Provider:   You may see Lauree Chandler, MD or one of the following Advanced Practice Providers on your designated Care Team:    Melina Copa, PA-C  Ermalinda Barrios, PA-C    Other Instructions

## 2020-02-01 NOTE — Progress Notes (Signed)
Chief Complaint  Patient presents with  . Follow-up    CAD   History of Present Illness: 69 yo male with history of HTN, HLD and CAD who is here today for cardiac follow up. He was admitted to [facility] November 2017 with an inferior STEMI. His sub-totally occluded RCA was treated with a bare metal stent. He was also found to have severe disease in the left main artery and Circumflex. He underwent 4V CABG (LIMA to LAD, SVG to Diagonal, SVG to OM, SVG to PDA) on 10/03/16 after he had completed 1 month of dual antiplatelet therapy post PCI/bare metal stent placement. LVEF 60-65% by echo November 2017. He was seen by telemedicine visit 01/24/20 and was doing well.   He is here today for follow up. The patient denies any chest pain, dyspnea, palpitations, lower extremity edema, orthopnea, PND, dizziness, near syncope or syncope.   Primary Care Physician: Neale Burly, MD  Past Medical History:  Diagnosis Date  . Anginal pain ([place])   . Arthritis    "probably"  . BPH (benign prostatic hyperplasia)   . Coronary artery disease   . Dyspnea   . GERD (gastroesophageal reflux disease)   . History of kidney stones   . Hypertension   . Kidney stones   . Macular degeneration   . Myocardial infarction ([place])   . Seasonal allergies     Past Surgical History:  Procedure Laterality Date  . APPENDECTOMY    . BACK SURGERY    . CARDIAC CATHETERIZATION N/A 08/21/2016   Procedure: Left Heart Cath and Coronary Angiography;  Surgeon: Burnell Blanks, MD;  Location: [place] [facility];  Service: Cardiovascular;  Laterality: N/A;  . CARDIAC CATHETERIZATION N/A 08/21/2016   Procedure: Coronary Stent Intervention;  Surgeon: Burnell Blanks, MD;  Location: [place] [facility];  Service: Cardiovascular;  Laterality: N/A;  . CATARACT EXTRACTION W/ INTRAOCULAR LENS  IMPLANT, BILATERAL Bilateral   . COLONOSCOPY    . CORONARY ARTERY BYPASS GRAFT N/A 10/03/2016   Procedure: CORONARY ARTERY BYPASS  GRAFTING (CABG)x 4 with endoscopic harvesting of right saphenous vein ,LIMA-LAD SVG-DIAG SVG-OM SVG-RCA;  Surgeon: Gaye Pollack, MD;  Location: [place];  Service: Open Heart Surgery;  Laterality: N/A;  . EYE SURGERY    . GAS INSERTION Right 02/05/2016   Procedure: INSERTION OF GAS-C3F8;  Surgeon: Hayden Pedro, MD;  Location: [place];  Service: Ophthalmology;  Laterality: Right;  . HAND SURGERY Right    thumb; S/P "cut his arm"  . LASER PHOTO ABLATION Right 02/05/2016   Procedure: LASER PHOTO ABLATION-HEADSCOPE LASER;  Surgeon: Hayden Pedro, MD;  Location: [place];  Service: Ophthalmology;  Laterality: Right;  . LUMBAR DISC SURGERY  ~ 2002  . RETINAL DETACHMENT REPAIR W/ SCLERAL BUCKLE LE Right 02/05/2016  . SCLERAL BUCKLE Right 02/05/2016   Procedure: SCLERAL BUCKLE;  Surgeon: Hayden Pedro, MD;  Location: [place];  Service: Ophthalmology;  Laterality: Right;  . TEE WITHOUT CARDIOVERSION N/A 10/03/2016   Procedure: TRANSESOPHAGEAL ECHOCARDIOGRAM (TEE);  Surgeon: Gaye Pollack, MD;  Location: [place];  Service: Open Heart Surgery;  Laterality: N/A;  . TONSILLECTOMY      Current Outpatient Medications  Medication Sig Dispense Refill  . aspirin EC 81 MG tablet Take 1 tablet (81 mg total) by mouth daily.    [name] atorvastatin (LIPITOR) 40 MG tablet TAKE 1 TABLET BY MOUTH EVERY DAY 90 tablet 4  . Loratadine-Pseudoephedrine (CLARITIN-D 24 HOUR PO) Take 1  tablet by mouth daily. Pt unsure of dosage    . metoprolol tartrate (LOPRESSOR) 25 MG tablet Take 1 tablet (25 mg total) by mouth 2 (two) times daily. 60 tablet 11  . Multiple Vitamins-Minerals (ICAPS MV PO) Take 1 tablet by mouth 2 (two) times daily.     . pantoprazole (PROTONIX) 40 MG tablet Take 40 mg by mouth daily.    . Polyvinyl Alcohol-Povidone (REFRESH OP) Apply 1 drop to eye 4 (four) times daily as needed (dry eyes).    . ramipril (ALTACE) 5 MG capsule Take 5 mg by mouth daily.    . tamsulosin (FLOMAX) 0.4 MG CAPS capsule Take 0.4 mg by mouth  daily.     No current facility-administered medications for this visit.    Allergies  Allergen Reactions  . No Known Allergies     Social History   Socioeconomic History  . Marital status: Married    Spouse name: Not on file  . Number of children: Not on file  . Years of education: Not on file  . Highest education level: Not on file  Occupational History  . Not on file  Tobacco Use  . Smoking status: Former Smoker    Packs/day: 3.00    Years: 16.00    Pack years: 48.00    Types: Cigarettes    Quit date: 02/04/1979    Years since quitting: 41.0  . Smokeless tobacco: Never Used  Substance and Sexual Activity  . Alcohol use: Yes    Comment: 02/06/2016 "a beer a couple times/month"  . Drug use: No  . Sexual activity: Not Currently  Other Topics Concern  . Not on file  Social History Narrative  . Not on file   Social Determinants of Health   Financial Resource Strain:   . Difficulty of Paying Living Expenses:   Food Insecurity:   . Worried About Charity fundraiser in the Last Year:   . Arboriculturist in the Last Year:   Transportation Needs:   . Film/video editor (Medical):   [name] Lack of Transportation (Non-Medical):   Physical Activity:   . Days of Exercise per Week:   . Minutes of Exercise per Session:   Stress:   . Feeling of Stress :   Social Connections:   . Frequency of Communication with Friends and Family:   . Frequency of Social Gatherings with Friends and Family:   . Attends Religious Services:   . Active Member of Clubs or Organizations:   . Attends Archivist Meetings:   [name] Marital Status:   Intimate Partner Violence:   . Fear of Current or Ex-Partner:   . Emotionally Abused:   [name] Physically Abused:   . Sexually Abused:     Family History  Problem Relation Age of Onset  . Hypertension Mother   . Congestive Heart Failure Father     Review of Systems:  As stated in the HPI and otherwise negative.   BP 122/74   Pulse 68   Ht  5\' 9"  (1.753 m)   Wt 200 lb 1.9 oz (90.8 kg)   SpO2 96%   BMI 29.55 kg/m   Physical Examination:  General: Well developed, well nourished, NAD  HEENT: OP clear, mucus membranes moist  SKIN: warm, dry. No rashes. Neuro: No focal deficits  Musculoskeletal: Muscle strength 5/5 all ext  Psychiatric: Mood and affect normal  Neck: No JVD, no carotid bruits, no thyromegaly, no lymphadenopathy.  Lungs:Clear bilaterally, no wheezes,  rhonci, crackles Cardiovascular: Regular rate and rhythm. No murmurs, gallops or rubs. Abdomen:Soft. Bowel sounds present. Non-tender.  Extremities: No lower extremity edema. Pulses are 2 + in the bilateral DP/PT.  EKG:  EKG is  ordered today. The ekg ordered today demonstrates Sinus  Recent Labs: No results found for requested labs within last 8760 hours.   Lipid Panel Followed in primary care.    Wt Readings from Last 3 Encounters:  02/01/20 200 lb 1.9 oz (90.8 kg)  01/24/19 194 lb (88 kg)  01/25/18 198 lb (89.8 kg)     Other studies Reviewed: Additional studies/ records that were reviewed today include: . Review of the above records demonstrates:   Assessment and Plan:   1. CAD s/p CABG without angina: He had 4V CABG in December 2017.He has no chest pain. Will continue ASA, beta blocker and statin.    2. HTN:BP controlled. Continue current therapy  3. Hyperlipidemia:Lipids controlled in primary care. Will continue statin.    Current medicines are reviewed at length with the patient today.  The patient does not have concerns regarding medicines.  The following changes have been made:  no change  Labs/ tests ordered today include:  No orders of the defined types were placed in this encounter.   Disposition:   FU with me  in 12  months  Signed, Lauree Chandler, MD 02/01/2020 4:28 PM    [facility] [address], [place], [place]  [address] Phone: [phone]; Fax: [phone]

## 2020-04-26 DIAGNOSIS — I10 Essential (primary) hypertension: Secondary | ICD-10-CM | POA: Diagnosis not present

## 2020-04-26 DIAGNOSIS — I25798 Atherosclerosis of other coronary artery bypass graft(s) with other forms of angina pectoris: Secondary | ICD-10-CM | POA: Diagnosis not present

## 2020-04-26 DIAGNOSIS — K2100 Gastro-esophageal reflux disease with esophagitis, without bleeding: Secondary | ICD-10-CM | POA: Diagnosis not present

## 2020-04-29 ENCOUNTER — Other Ambulatory Visit: Payer: Self-pay | Admitting: Cardiovascular Disease

## 2020-06-27 ENCOUNTER — Encounter (INDEPENDENT_AMBULATORY_CARE_PROVIDER_SITE_OTHER): Payer: Medicare Other | Admitting: Ophthalmology

## 2020-06-27 ENCOUNTER — Other Ambulatory Visit: Payer: Self-pay

## 2020-06-27 DIAGNOSIS — H353111 Nonexudative age-related macular degeneration, right eye, early dry stage: Secondary | ICD-10-CM | POA: Diagnosis not present

## 2020-06-27 DIAGNOSIS — H43813 Vitreous degeneration, bilateral: Secondary | ICD-10-CM

## 2020-06-27 DIAGNOSIS — H35033 Hypertensive retinopathy, bilateral: Secondary | ICD-10-CM

## 2020-06-27 DIAGNOSIS — I10 Essential (primary) hypertension: Secondary | ICD-10-CM | POA: Diagnosis not present

## 2020-06-27 DIAGNOSIS — H338 Other retinal detachments: Secondary | ICD-10-CM | POA: Diagnosis not present

## 2020-06-27 DIAGNOSIS — H353122 Nonexudative age-related macular degeneration, left eye, intermediate dry stage: Secondary | ICD-10-CM

## 2020-06-28 ENCOUNTER — Encounter (INDEPENDENT_AMBULATORY_CARE_PROVIDER_SITE_OTHER): Payer: Medicare Other | Admitting: Ophthalmology

## 2020-07-06 DIAGNOSIS — H40023 Open angle with borderline findings, high risk, bilateral: Secondary | ICD-10-CM | POA: Diagnosis not present

## 2020-07-06 DIAGNOSIS — Z961 Presence of intraocular lens: Secondary | ICD-10-CM | POA: Diagnosis not present

## 2020-07-31 DIAGNOSIS — K2100 Gastro-esophageal reflux disease with esophagitis, without bleeding: Secondary | ICD-10-CM | POA: Diagnosis not present

## 2020-07-31 DIAGNOSIS — I10 Essential (primary) hypertension: Secondary | ICD-10-CM | POA: Diagnosis not present

## 2020-07-31 DIAGNOSIS — I25798 Atherosclerosis of other coronary artery bypass graft(s) with other forms of angina pectoris: Secondary | ICD-10-CM | POA: Diagnosis not present

## 2020-09-19 DIAGNOSIS — D485 Neoplasm of uncertain behavior of skin: Secondary | ICD-10-CM | POA: Diagnosis not present

## 2020-09-19 DIAGNOSIS — L853 Xerosis cutis: Secondary | ICD-10-CM | POA: Diagnosis not present

## 2020-09-19 DIAGNOSIS — D1801 Hemangioma of skin and subcutaneous tissue: Secondary | ICD-10-CM | POA: Diagnosis not present

## 2020-09-19 DIAGNOSIS — D225 Melanocytic nevi of trunk: Secondary | ICD-10-CM | POA: Diagnosis not present

## 2020-09-19 DIAGNOSIS — L821 Other seborrheic keratosis: Secondary | ICD-10-CM | POA: Diagnosis not present

## 2020-10-31 DIAGNOSIS — I25798 Atherosclerosis of other coronary artery bypass graft(s) with other forms of angina pectoris: Secondary | ICD-10-CM | POA: Diagnosis not present

## 2020-10-31 DIAGNOSIS — K2100 Gastro-esophageal reflux disease with esophagitis, without bleeding: Secondary | ICD-10-CM | POA: Diagnosis not present

## 2020-10-31 DIAGNOSIS — R7303 Prediabetes: Secondary | ICD-10-CM | POA: Diagnosis not present

## 2020-10-31 DIAGNOSIS — Z0000 Encounter for general adult medical examination without abnormal findings: Secondary | ICD-10-CM | POA: Diagnosis not present

## 2020-10-31 DIAGNOSIS — I10 Essential (primary) hypertension: Secondary | ICD-10-CM | POA: Diagnosis not present

## 2020-11-13 DIAGNOSIS — H524 Presbyopia: Secondary | ICD-10-CM | POA: Diagnosis not present

## 2020-11-13 DIAGNOSIS — H353131 Nonexudative age-related macular degeneration, bilateral, early dry stage: Secondary | ICD-10-CM | POA: Diagnosis not present

## 2020-11-15 ENCOUNTER — Encounter: Payer: Self-pay | Admitting: *Deleted

## 2020-12-03 ENCOUNTER — Encounter (INDEPENDENT_AMBULATORY_CARE_PROVIDER_SITE_OTHER): Payer: Self-pay | Admitting: *Deleted

## 2020-12-05 ENCOUNTER — Ambulatory Visit: Payer: Medicare Other

## 2021-01-21 NOTE — Progress Notes (Signed)
Cardiology Office Note    Date:  01/29/2021   ID:  Scott Jacobson, Scott Jacobson 05-Apr-1951, MRN [id number]   PCP:  Neale Burly, MD   [place]  Cardiologist:  Lauree Chandler, MD  Advanced Practice Provider:  No care team member to display Electrophysiologist:  None   [id number]   Chief Complaint  Patient presents with  . Follow-up    History of Present Illness:  Scott Jacobson is a 70 y.o. male with history of hypertension, HLD, CAD status post inferior STEMI 08/2016 treated with BMS to the RCA also had severe disease in the left main and circumflex and underwent CABG times 4-12/29/17 with a LIMA to the LAD, SVG to diagonal, SVG to OM, SVG to PDA.  Last saw Dr. Angelena Form 02/01/2020 and was doing well.  No changes made.  Patient comes in for yearly f/u. Walks 4-5 miles every other day. No chest pain, dyspnea, dizziness, edema. Has gained some weight. Eats out and a fair amount of ice cream.    Past Medical History:  Diagnosis Date  . Anginal pain ([place])   . Arthritis    "probably"  . BPH (benign prostatic hyperplasia)   . Coronary artery disease   . Dyspnea   . GERD (gastroesophageal reflux disease)   . History of kidney stones   . Hypertension   . Kidney stones   . Macular degeneration   . Myocardial infarction ([place])   . Seasonal allergies     Past Surgical History:  Procedure Laterality Date  . APPENDECTOMY    . BACK SURGERY    . CARDIAC CATHETERIZATION N/A 08/21/2016   Procedure: Left Heart Cath and Coronary Angiography;  Surgeon: Burnell Blanks, MD;  Location: [place] [facility];  Service: Cardiovascular;  Laterality: N/A;  . CARDIAC CATHETERIZATION N/A 08/21/2016   Procedure: Coronary Stent Intervention;  Surgeon: Burnell Blanks, MD;  Location: [place] [facility];  Service: Cardiovascular;  Laterality: N/A;  . CATARACT EXTRACTION W/ INTRAOCULAR LENS  IMPLANT, BILATERAL Bilateral   . COLONOSCOPY    . CORONARY ARTERY  BYPASS GRAFT N/A 10/03/2016   Procedure: CORONARY ARTERY BYPASS GRAFTING (CABG)x 4 with endoscopic harvesting of right saphenous vein ,LIMA-LAD SVG-DIAG SVG-OM SVG-RCA;  Surgeon: Gaye Pollack, MD;  Location: [place];  Service: Open Heart Surgery;  Laterality: N/A;  . EYE SURGERY    . GAS INSERTION Right 02/05/2016   Procedure: INSERTION OF GAS-C3F8;  Surgeon: Hayden Pedro, MD;  Location: [place];  Service: Ophthalmology;  Laterality: Right;  . HAND SURGERY Right    thumb; S/P "cut his arm"  . LASER PHOTO ABLATION Right 02/05/2016   Procedure: LASER PHOTO ABLATION-HEADSCOPE LASER;  Surgeon: Hayden Pedro, MD;  Location: [place];  Service: Ophthalmology;  Laterality: Right;  . LUMBAR DISC SURGERY  ~ 2002  . RETINAL DETACHMENT REPAIR W/ SCLERAL BUCKLE LE Right 02/05/2016  . SCLERAL BUCKLE Right 02/05/2016   Procedure: SCLERAL BUCKLE;  Surgeon: Hayden Pedro, MD;  Location: [place];  Service: Ophthalmology;  Laterality: Right;  . TEE WITHOUT CARDIOVERSION N/A 10/03/2016   Procedure: TRANSESOPHAGEAL ECHOCARDIOGRAM (TEE);  Surgeon: Gaye Pollack, MD;  Location: [employer];  Service: Open Heart Surgery;  Laterality: N/A;  . TONSILLECTOMY      Current Medications: Current Meds  Medication Sig  . aspirin EC 81 MG tablet Take 1 tablet (81 mg total) by mouth daily.  [name] atorvastatin (LIPITOR) 40 MG tablet TAKE 1 TABLET  BY MOUTH EVERY DAY  . Loratadine-Pseudoephedrine (CLARITIN-D 24 HOUR PO) Take 1 tablet by mouth daily. Pt unsure of dosage  . metoprolol tartrate (LOPRESSOR) 25 MG tablet Take 1 tablet (25 mg total) by mouth 2 (two) times daily.  . Multiple Vitamins-Minerals (ICAPS MV PO) Take 1 tablet by mouth 2 (two) times daily.   . pantoprazole (PROTONIX) 40 MG tablet Take 40 mg by mouth daily.  . Polyvinyl Alcohol-Povidone (REFRESH OP) Apply 1 drop to eye 4 (four) times daily as needed (dry eyes).  . ramipril (ALTACE) 5 MG capsule Take 5 mg by mouth daily.  . tamsulosin (FLOMAX) 0.4 MG CAPS capsule Take 0.4  mg by mouth daily.     Allergies:   No known allergies   Social History   Socioeconomic History  . Marital status: Married    Spouse name: Not on file  . Number of children: Not on file  . Years of education: Not on file  . Highest education level: Not on file  Occupational History  . Not on file  Tobacco Use  . Smoking status: Former Smoker    Packs/day: 3.00    Years: 16.00    Pack years: 48.00    Types: Cigarettes    Quit date: 02/04/1979    Years since quitting: 42.0  . Smokeless tobacco: Never Used  Substance and Sexual Activity  . Alcohol use: Yes    Comment: 02/06/2016 "a beer a couple times/month"  . Drug use: No  . Sexual activity: Not Currently  Other Topics Concern  . Not on file  Social History Narrative  . Not on file   Social Determinants of Health   Financial Resource Strain: Not on file  Food Insecurity: Not on file  Transportation Needs: Not on file  Physical Activity: Not on file  Stress: Not on file  Social Connections: Not on file     Family History:  The patient's family history includes Congestive Heart Failure in his father; Hypertension in his mother.   ROS:   Please see the history of present illness.    ROS All other systems reviewed and are negative.   PHYSICAL EXAM:   VS:  BP 124/68   Pulse (!) 53   Ht 5\' 9"  (1.753 m)   Wt 199 lb 12.8 oz (90.6 kg)   SpO2 98%   BMI 29.51 kg/m   Physical Exam  GEN: Well nourished, well developed, in no acute distress  Neck: no JVD, carotid bruits, or masses Cardiac:RRR; no murmurs, rubs, or gallops  Respiratory:  clear to auscultation bilaterally, normal work of breathing GI: soft, nontender, nondistended, + BS Ext: without cyanosis, clubbing, or edema, Good distal pulses bilaterally Neuro:  Alert and Oriented x 3, Strength and sensation are intact Psych: euthymic mood, full affect  Wt Readings from Last 3 Encounters:  01/29/21 199 lb 12.8 oz (90.6 kg)  02/01/20 200 lb 1.9 oz (90.8 kg)   01/24/19 194 lb (88 kg)      Studies/Labs Reviewed:   EKG:  EKG is  ordered today.  The ekg ordered today demonstrates NSR nonspecific ST changes, no acute change.   Recent Labs: No results found for requested labs within last 8760 hours.   Lipid Panel    Component Value Date/Time   CHOL 190 08/21/2016 1640   TRIG 19 08/21/2016 1640   HDL 58 08/21/2016 1640   CHOLHDL 3.3 08/21/2016 1640   VLDL 4 08/21/2016 1640   LDLCALC 128 (H) 08/21/2016 1640  Additional studies/ records that were reviewed today include:  TEE 10/03/2016 Tricuspid valve: Trace regurgitation. The tricuspid valve regurgitation  jet is central.   Mitral valve: Dilated mitral annulus. Mild leaflet thickening is  present. Predominantly anterior mild mitral annular calcification. Mild  regurgitation.   Aorta: The aortic root is mildly dilated at the sinus of Valsalva. The  ascending aorta is mildly dilated.   Left ventricle: Normal cavity size and wall thickness. LV systolic  function is normal with an EF of 60-65%. There are no obvious wall motion  abnormalities.   Aortic valve: The valve is trileaflet. Mild valve thickening present. No  stenosis. No regurgitation. No AV vegetation. No evidence of papillary  fibroelastoma.   Right ventricle: Normal wall thickness and ejection fraction. Cavity is  mildly dilated.   Left ventricle: Normal cavity size.   Septum: No Patent Foramen Ovale present.   Left atrium: Patent foramen ovale not present.     Addended by Oleta Mouse, MD on 10/23/2016  8:34 AM      Risk Assessment/Calculations:         ASSESSMENT:    1. Coronary artery disease involving coronary bypass graft of native heart without angina pectoris   2. Essential hypertension   3. Hyperlipidemia, unspecified hyperlipidemia type      PLAN:  In order of problems listed above:  CAD status post CABG times 01/2016-no angina, continue ASA, lipitor, metoprolol and  altace  Hypertension well controlled  Hyperlipidemia on lipitor. LDL 38, trig 228 -reduce sugars in diet.  Shared Decision Making/Informed Consent        Medication Adjustments/Labs and Tests Ordered: Current medicines are reviewed at length with the patient today.  Concerns regarding medicines are outlined above.  Medication changes, Labs and Tests ordered today are listed in the Patient Instructions below. Patient Instructions  Medication Instructions:  Your physician recommends that you continue on your current medications as directed. Please refer to the Current Medication list given to you today.  *If you need a refill on your cardiac medications before your next appointment, please call your pharmacy*   Lab Work: None ordered  If you have labs (blood work) drawn today and your tests are completely normal, you will receive your results only by: [name] MyChart Message (if you have MyChart) OR . A paper copy in the mail If you have any lab test that is abnormal or we need to change your treatment, we will call you to review the results.   Testing/Procedures: None ordered    Follow-Up: At [facility], you and your health needs are our priority.  As part of our continuing mission to provide you with exceptional heart care, we have created designated Provider Care Teams.  These Care Teams include your primary Cardiologist (physician) and Advanced Practice Providers (APPs -  Physician Assistants and Nurse Practitioners) who all work together to provide you with the care you need, when you need it.  We recommend signing up for the patient portal called "MyChart".  Sign up information is provided on this After Visit Summary.  MyChart is used to connect with patients for Virtual Visits (Telemedicine).  Patients are able to view lab/test results, encounter notes, upcoming appointments, etc.  Non-urgent messages can be sent to your provider as well.   To learn more about what you can do  with MyChart, go to [email or web address].    Your next appointment:   12 month(s)  The format for your next appointment:   In Person  Provider:   You may see Lauree Chandler, MD  or one of the following Advanced Practice Providers on your designated Care Team:    Melina Copa, PA-C  Ermalinda Barrios, PA-C    Other Instructions None      Signed, Ermalinda Barrios, PA-C  01/29/2021 10:17 AM    [address] [place], [place],   [address] Phone: [phone]; Fax: [phone]

## 2021-01-29 ENCOUNTER — Other Ambulatory Visit: Payer: Self-pay

## 2021-01-29 ENCOUNTER — Ambulatory Visit: Payer: Medicare Other | Admitting: Physician Assistant

## 2021-01-29 ENCOUNTER — Encounter: Payer: Self-pay | Admitting: Physician Assistant

## 2021-01-29 VITALS — BP 124/68 | HR 53 | Ht 69.0 in | Wt 199.8 lb

## 2021-01-29 DIAGNOSIS — E785 Hyperlipidemia, unspecified: Secondary | ICD-10-CM | POA: Diagnosis not present

## 2021-01-29 DIAGNOSIS — I10 Essential (primary) hypertension: Secondary | ICD-10-CM

## 2021-01-29 DIAGNOSIS — I25810 Atherosclerosis of coronary artery bypass graft(s) without angina pectoris: Secondary | ICD-10-CM

## 2021-01-29 NOTE — Patient Instructions (Signed)
Medication Instructions:  Your physician recommends that you continue on your current medications as directed. Please refer to the Current Medication list given to you today.  *If you need a refill on your cardiac medications before your next appointment, please call your pharmacy*   Lab Work: None ordered  If you have labs (blood work) drawn today and your tests are completely normal, you will receive your results only by: MyChart Message (if you have MyChart) OR A paper copy in the mail If you have any lab test that is abnormal or we need to change your treatment, we will call you to review the results.   Testing/Procedures: None ordered    Follow-Up: At [facility], you and your health needs are our priority.  As part of our continuing mission to provide you with exceptional heart care, we have created designated Provider Care Teams.  These Care Teams include your primary Cardiologist (physician) and Advanced Practice Providers (APPs -  Physician Assistants and Nurse Practitioners) who all work together to provide you with the care you need, when you need it.  We recommend signing up for the patient portal called "MyChart".  Sign up information is provided on this After Visit Summary.  MyChart is used to connect with patients for Virtual Visits (Telemedicine).  Patients are able to view lab/test results, encounter notes, upcoming appointments, etc.  Non-urgent messages can be sent to your provider as well.   To learn more about what you can do with MyChart, go to [email or web address].    Your next appointment:   12 month(s)  The format for your next appointment:   In Person  Provider:   You may see Christopher McAlhany, MD or one of the following Advanced Practice Providers on your designated Care Team:   Dayna Dunn, PA-C Michele Lenze, PA-C   Other Instructions None   

## 2021-02-13 ENCOUNTER — Encounter (INDEPENDENT_AMBULATORY_CARE_PROVIDER_SITE_OTHER): Payer: Self-pay

## 2021-02-13 ENCOUNTER — Telehealth (INDEPENDENT_AMBULATORY_CARE_PROVIDER_SITE_OTHER): Payer: Self-pay

## 2021-02-13 ENCOUNTER — Other Ambulatory Visit (INDEPENDENT_AMBULATORY_CARE_PROVIDER_SITE_OTHER): Payer: Self-pay

## 2021-02-13 DIAGNOSIS — Z1211 Encounter for screening for malignant neoplasm of colon: Secondary | ICD-10-CM

## 2021-02-13 MED ORDER — PEG 3350-KCL-NA BICARB-NACL 420 G PO SOLR: 4000.0000 mL | ORAL | 0 refills | Status: DC

## 2021-02-13 NOTE — Telephone Encounter (Signed)
Scott Jacobson, CMA  

## 2021-02-13 NOTE — Telephone Encounter (Signed)
Referring MD/PCP: Hasanaj  Procedure: Tcs  Reason/Indication:  Screening  Has patient had this procedure before?  Yes   If so, when, by whom and where?  10 yrs ago  Is there a family history of colon cancer?  no  Who?  What age when diagnosed?    Is patient diabetic? If yes, Type 1 or Type 2   no      Does patient have prosthetic heart valve or mechanical valve?  no  Do you have a pacemaker/defibrillator?  no  Has patient ever had endocarditis/atrial fibrillation? no  Have you had a stroke/heart attack last 6 mths? no  Does patient use oxygen? no  Has patient had joint replacement within last 12 months?  no  Is patient constipated or do they take laxatives? no  Does patient have a history of alcohol/drug use?  no  Is patient on blood thinner such as Coumadin, Plavix and/or Aspirin? yes  Do you take medicine for weight loss?  no  For male patients,: do you still have your menstrual cycle? no  Medications: asa 81 mg daily, ramipril 5 mg daily, metoprolol 25 daily, atorvastatin 40 mg daily, tamsulosin 0.4 mg daily, pantoprazole 40 mg daily, I-caps for eyes, centrum silver daily, claritin daily, prevagen daily   Allergies: nkda  Medication Adjustment per Dr Rehman/Dr Jenetta Downer none  Procedure date & time: 03/05/21 at 11:00

## 2021-02-13 NOTE — Telephone Encounter (Signed)
Ok to schedule.  Thanks,  Dewey Viens Castaneda Mayorga, MD Gastroenterology and Hepatology [facility]  

## 2021-02-25 ENCOUNTER — Telehealth (INDEPENDENT_AMBULATORY_CARE_PROVIDER_SITE_OTHER): Payer: Self-pay

## 2021-02-25 MED ORDER — NA SULFATE-K SULFATE-MG SULF 17.5-3.13-1.6 GM/177ML PO SOLN: 1.0000 | Freq: Once | ORAL | 0 refills | Status: AC

## 2021-02-25 NOTE — Telephone Encounter (Signed)
Scott Jacobson, CMA  

## 2021-03-01 ENCOUNTER — Other Ambulatory Visit (HOSPITAL_COMMUNITY)
Admission: RE | Admit: 2021-03-01 | Discharge: 2021-03-01 | Disposition: A | Discharge status: Home / Self Care | Payer: Medicare Other | Source: Ambulatory Visit | Attending: Gastroenterology | Admitting: Gastroenterology

## 2021-03-01 ENCOUNTER — Other Ambulatory Visit: Payer: Self-pay

## 2021-03-01 DIAGNOSIS — Z01812 Encounter for preprocedural laboratory examination: Secondary | ICD-10-CM | POA: Diagnosis not present

## 2021-03-01 DIAGNOSIS — Z20822 Contact with and (suspected) exposure to covid-19: Secondary | ICD-10-CM | POA: Insufficient documentation

## 2021-03-02 LAB — SARS CORONAVIRUS 2 (TAT 6-24 HRS): SARS Coronavirus 2: NEGATIVE

## 2021-03-04 DIAGNOSIS — K2100 Gastro-esophageal reflux disease with esophagitis, without bleeding: Secondary | ICD-10-CM | POA: Diagnosis not present

## 2021-03-04 DIAGNOSIS — I10 Essential (primary) hypertension: Secondary | ICD-10-CM | POA: Diagnosis not present

## 2021-03-05 ENCOUNTER — Ambulatory Visit (HOSPITAL_COMMUNITY): Payer: Medicare Other | Admitting: Anesthesiology

## 2021-03-05 ENCOUNTER — Encounter (HOSPITAL_COMMUNITY): Payer: Self-pay | Admitting: Gastroenterology

## 2021-03-05 ENCOUNTER — Other Ambulatory Visit: Payer: Self-pay

## 2021-03-05 ENCOUNTER — Ambulatory Visit (HOSPITAL_COMMUNITY)
Admission: RE | Admit: 2021-03-05 | Discharge: 2021-03-05 | Disposition: A | Discharge status: Home / Self Care | Payer: Medicare Other | Attending: Gastroenterology | Admitting: Gastroenterology

## 2021-03-05 ENCOUNTER — Encounter (HOSPITAL_COMMUNITY)
Admission: RE | Disposition: A | Discharge status: Home / Self Care | Payer: Self-pay | Source: Home / Self Care | Attending: Gastroenterology

## 2021-03-05 DIAGNOSIS — D122 Benign neoplasm of ascending colon: Secondary | ICD-10-CM | POA: Diagnosis not present

## 2021-03-05 DIAGNOSIS — Z951 Presence of aortocoronary bypass graft: Secondary | ICD-10-CM | POA: Diagnosis not present

## 2021-03-05 DIAGNOSIS — I10 Essential (primary) hypertension: Secondary | ICD-10-CM | POA: Diagnosis not present

## 2021-03-05 DIAGNOSIS — K635 Polyp of colon: Secondary | ICD-10-CM | POA: Diagnosis not present

## 2021-03-05 DIAGNOSIS — I252 Old myocardial infarction: Secondary | ICD-10-CM | POA: Insufficient documentation

## 2021-03-05 DIAGNOSIS — K5730 Diverticulosis of large intestine without perforation or abscess without bleeding: Secondary | ICD-10-CM | POA: Insufficient documentation

## 2021-03-05 DIAGNOSIS — Z79899 Other long term (current) drug therapy: Secondary | ICD-10-CM | POA: Insufficient documentation

## 2021-03-05 DIAGNOSIS — D125 Benign neoplasm of sigmoid colon: Secondary | ICD-10-CM | POA: Insufficient documentation

## 2021-03-05 DIAGNOSIS — N400 Enlarged prostate without lower urinary tract symptoms: Secondary | ICD-10-CM | POA: Insufficient documentation

## 2021-03-05 DIAGNOSIS — Z9049 Acquired absence of other specified parts of digestive tract: Secondary | ICD-10-CM | POA: Diagnosis not present

## 2021-03-05 DIAGNOSIS — Z8249 Family history of ischemic heart disease and other diseases of the circulatory system: Secondary | ICD-10-CM | POA: Insufficient documentation

## 2021-03-05 DIAGNOSIS — Z955 Presence of coronary angioplasty implant and graft: Secondary | ICD-10-CM | POA: Insufficient documentation

## 2021-03-05 DIAGNOSIS — Z87891 Personal history of nicotine dependence: Secondary | ICD-10-CM | POA: Diagnosis not present

## 2021-03-05 DIAGNOSIS — K5790 Diverticulosis of intestine, part unspecified, without perforation or abscess without bleeding: Secondary | ICD-10-CM | POA: Diagnosis not present

## 2021-03-05 DIAGNOSIS — Z7982 Long term (current) use of aspirin: Secondary | ICD-10-CM | POA: Insufficient documentation

## 2021-03-05 DIAGNOSIS — Z1211 Encounter for screening for malignant neoplasm of colon: Secondary | ICD-10-CM | POA: Diagnosis not present

## 2021-03-05 DIAGNOSIS — I2510 Atherosclerotic heart disease of native coronary artery without angina pectoris: Secondary | ICD-10-CM | POA: Diagnosis not present

## 2021-03-05 DIAGNOSIS — K648 Other hemorrhoids: Secondary | ICD-10-CM | POA: Insufficient documentation

## 2021-03-05 DIAGNOSIS — K649 Unspecified hemorrhoids: Secondary | ICD-10-CM | POA: Diagnosis not present

## 2021-03-05 HISTORY — PX: POLYPECTOMY: SHX5525

## 2021-03-05 HISTORY — PX: COLONOSCOPY WITH PROPOFOL: SHX5780

## 2021-03-05 LAB — HM COLONOSCOPY

## 2021-03-05 SURGERY — COLONOSCOPY WITH PROPOFOL
Anesthesia: General

## 2021-03-05 MED ORDER — STERILE WATER FOR IRRIGATION IR SOLN
Status: DC | PRN
  Administered 2021-03-05: 1.5 mL

## 2021-03-05 MED ORDER — PROPOFOL 10 MG/ML IV BOLUS
INTRAVENOUS | Status: DC | PRN
  Administered 2021-03-05 (×2): 50 mg via INTRAVENOUS
  Administered 2021-03-05: 100 mg via INTRAVENOUS
  Administered 2021-03-05 (×2): 50 mg via INTRAVENOUS
  Administered 2021-03-05: 100 mg via INTRAVENOUS

## 2021-03-05 MED ORDER — LACTATED RINGERS IV SOLN: INTRAVENOUS | Status: DC

## 2021-03-05 NOTE — Transfer of Care (Signed)
Immediate Anesthesia Transfer of Care Note  Patient: Scott Jacobson  Procedure(s) Performed: COLONOSCOPY WITH PROPOFOL (N/A ) POLYPECTOMY  Patient Location: PACU  Anesthesia Type:MAC  Level of Consciousness: awake, alert  and oriented  Airway & Oxygen Therapy: Patient Spontanous Breathing  Post-op Assessment: Report given to RN and Post -op Vital signs reviewed and stable  Post vital signs: Reviewed and stable  Last Vitals:  Vitals Value Taken Time  BP    Temp    Pulse 84 03/05/2021 1043  Resp 14 03/05/2021 1043  SpO2 96% 03/05/2021 1043    Last Pain:  Vitals:   03/05/21 1011  TempSrc:   PainSc: 0-No pain      Patients Stated Pain Goal: 8 (83/25/49 8264)  Complications: No complications documented.

## 2021-03-05 NOTE — Anesthesia Preprocedure Evaluation (Signed)
Anesthesia Evaluation  Patient identified by MRN, date of birth, ID band Patient awake    Reviewed: Allergy & Precautions, H&P , NPO status , Patient's Chart, lab work & pertinent test results, reviewed documented beta blocker date and time   Airway Mallampati: II  TM Distance: >3 FB Neck ROM: full    Dental no notable dental hx. (+) Teeth Intact   Pulmonary shortness of breath, former smoker,    Pulmonary exam normal breath sounds clear to auscultation       Cardiovascular Exercise Tolerance: Good hypertension, + CAD, + Past MI and + CABG   Rhythm:regular Rate:Normal     Neuro/Psych negative neurological ROS  negative psych ROS   GI/Hepatic Neg liver ROS, GERD  Medicated,  Endo/Other  negative endocrine ROS  Renal/GU Renal disease  negative genitourinary   Musculoskeletal   Abdominal   Peds  Hematology negative hematology ROS (+)   Anesthesia Other Findings   Reproductive/Obstetrics negative OB ROS                             Anesthesia Physical Anesthesia Plan  ASA: III  Anesthesia Plan: General   Post-op Pain Management:    Induction:   PONV Risk Score and Plan: Propofol infusion  Airway Management Planned:   Additional Equipment:   Intra-op Plan:   Post-operative Plan:   Informed Consent: I have reviewed the patients History and Physical, chart, labs and discussed the procedure including the risks, benefits and alternatives for the proposed anesthesia with the patient or authorized representative who has indicated his/her understanding and acceptance.     Dental Advisory Given  Plan Discussed with: CRNA  Anesthesia Plan Comments:         Anesthesia Quick Evaluation

## 2021-03-05 NOTE — Discharge Instructions (Signed)
You are being discharged to home.  Resume your previous diet.  We are waiting for your pathology results.  Your physician has recommended a repeat colonoscopy in three years for surveillance.   Monitored Anesthesia Care, Care After This sheet gives you information about how to care for yourself after your procedure. Your health care provider may also give you more specific instructions. If you have problems or questions, contact your health care provider. What can I expect after the procedure? After the procedure, it is common to have:  Tiredness.  Forgetfulness about what happened after the procedure.  Impaired judgment for important decisions.  Nausea or vomiting.  Some difficulty with balance. Follow these instructions at home: For the time period you were told by your health care provider:  Rest as needed.  Do not participate in activities where you could fall or become injured.  Do not drive or use machinery.  Do not drink alcohol.  Do not take sleeping pills or medicines that cause drowsiness.  Do not make important decisions or sign legal documents.  Do not take care of children on your own.      Eating and drinking  Follow the diet that is recommended by your health care provider.  Drink enough fluid to keep your urine pale yellow.  If you vomit: ? Drink water, juice, or soup when you can drink without vomiting. ? Make sure you have little or no nausea before eating solid foods. General instructions  Have a responsible adult stay with you for the time you are told. It is important to have someone help care for you until you are awake and alert.  Take over-the-counter and prescription medicines only as told by your health care provider.  If you have sleep apnea, surgery and certain medicines can increase your risk for breathing problems. Follow instructions from your health care provider about wearing your sleep device: ? Anytime you are sleeping,  including during daytime naps. ? While taking prescription pain medicines, sleeping medicines, or medicines that make you drowsy.  Avoid smoking.  Keep all follow-up visits as told by your health care provider. This is important. Contact a health care provider if:  You keep feeling nauseous or you keep vomiting.  You feel light-headed.  You are still sleepy or having trouble with balance after 24 hours.  You develop a rash.  You have a fever.  You have redness or swelling around the IV site. Get help right away if:  You have trouble breathing.  You have new-onset confusion at home. Summary  For several hours after your procedure, you may feel tired. You may also be forgetful and have poor judgment.  Have a responsible adult stay with you for the time you are told. It is important to have someone help care for you until you are awake and alert.  Rest as told. Do not drive or operate machinery. Do not drink alcohol or take sleeping pills.  Get help right away if you have trouble breathing, or if you suddenly become confused. This information is not intended to replace advice given to you by your health care provider. Make sure you discuss any questions you have with your health care provider. Document Revised: 06/07/2020 Document Reviewed: 08/25/2019 Elsevier Patient Education  2021 [place].  Colon Polyps  Colon polyps are tissue growths inside the colon, which is part of the large intestine. They are one of the types of polyps that can grow in the body. A polyp may  be a round bump or a mushroom-shaped growth. You could have one polyp or more than one. Most colon polyps are noncancerous (benign). However, some colon polyps can become cancerous over time. Finding and removing the polyps early can help prevent this. What are the causes? The exact cause of colon polyps is not known. What increases the risk? The following factors may make you more likely to develop this  condition:  Having a family history of colorectal cancer or colon polyps.  Being older than 71 years of age.  Being younger than 71 years of age and having a significant family history of colorectal cancer or colon polyps or a genetic condition that puts you at higher risk of getting colon polyps.  Having inflammatory bowel disease, such as ulcerative colitis or Crohn's disease.  Having certain conditions passed from parent to child (hereditary conditions), such as: ? Familial adenomatous polyposis (FAP). ? Lynch syndrome. ? Turcot syndrome. ? Peutz-Jeghers syndrome. ? MUTYH-associated polyposis (MAP).  Being overweight.  Certain lifestyle factors. These include smoking cigarettes, drinking too much alcohol, not getting enough exercise, and eating a diet that is high in fat and red meat and low in fiber.  Having had childhood cancer that was treated with radiation of the abdomen. What are the signs or symptoms? Many times, there are no symptoms. If you have symptoms, they may include:  Blood coming from the rectum during a bowel movement.  Blood in the stool (feces). The blood may be bright red or very dark in color.  Pain in the abdomen.  A change in bowel habits, such as constipation or diarrhea. How is this diagnosed? This condition is diagnosed with a colonoscopy. This is a procedure in which a lighted, flexible scope is inserted into the opening between the buttocks (anus) and then passed into the colon to examine the area. Polyps are sometimes found when a colonoscopy is done as part of routine cancer screening tests. How is this treated? This condition is treated by removing any polyps that are found. Most polyps can be removed during a colonoscopy. Those polyps will then be tested for cancer. Additional treatment may be needed depending on the results of testing. Follow these instructions at home: Eating and drinking  Eat foods that are high in fiber, such as fruits,  vegetables, and whole grains.  Eat foods that are high in calcium and vitamin D, such as milk, cheese, yogurt, eggs, liver, fish, and broccoli.  Limit foods that are high in fat, such as fried foods and desserts.  Limit the amount of red meat, precooked or cured meat, or other processed meat that you eat, such as hot dogs, sausages, bacon, or meat loaves.  Limit sugary drinks.   Lifestyle  Maintain a healthy weight, or lose weight if recommended by your health care provider.  Exercise every day or as told by your health care provider.  Do not use any products that contain nicotine or tobacco, such as cigarettes, e-cigarettes, and chewing tobacco. If you need help quitting, ask your health care provider.  Do not drink alcohol if: ? Your health care provider tells you not to drink. ? You are pregnant, may be pregnant, or are planning to become pregnant.  If you drink alcohol: ? Limit how much you use to:  0-1 drink a day for women.  0-2 drinks a day for men. ? Know how much alcohol is in your drink. In the [place] one drink equals one 12 oz bottle of beer (  355 mL), one 5 oz glass of wine (148 mL), or one 1 oz glass of hard liquor (44 mL). General instructions  Take over-the-counter and prescription medicines only as told by your health care provider.  Keep all follow-up visits. This is important. This includes having regularly scheduled colonoscopies. Talk to your health care provider about when you need a colonoscopy. Contact a health care provider if:  You have new or worsening bleeding during a bowel movement.  You have new or increased blood in your stool.  You have a change in bowel habits.  You lose weight for no known reason. Summary  Colon polyps are tissue growths inside the colon, which is part of the large intestine. They are one type of polyp that can grow in the body.  Most colon polyps are noncancerous (benign), but some can become cancerous over time.  This  condition is diagnosed with a colonoscopy.  This condition is treated by removing any polyps that are found. Most polyps can be removed during a colonoscopy. This information is not intended to replace advice given to you by your health care provider. Make sure you discuss any questions you have with your health care provider. Document Revised: 01/11/2020 Document Reviewed: 01/11/2020 Elsevier Patient Education  2021 [place].  Colonoscopy, Adult, Care After This sheet gives you information about how to care for yourself after your procedure. Your doctor may also give you more specific instructions. If you have problems or questions, call your doctor. What can I expect after the procedure? After the procedure, it is common to have:  A small amount of blood in your poop (stool) for 24 hours.  Some gas.  Mild cramping or bloating in your belly (abdomen). Follow these instructions at home: Eating and drinking  Drink enough fluid to keep your pee (urine) pale yellow.  Follow instructions from your doctor about what you cannot eat or drink.  Return to your normal diet as told by your doctor. Avoid heavy or fried foods that are hard to digest.   Activity  Rest as told by your doctor.  Do not sit for a long time without moving. Get up to take short walks every 1-2 hours. This is important. Ask for help if you feel weak or unsteady.  Return to your normal activities as told by your doctor. Ask your doctor what activities are safe for you. To help cramping and bloating:  Try walking around.  Put heat on your belly as told by your doctor. Use the heat source that your doctor recommends, such as a moist heat pack or a heating pad. ? Put a towel between your skin and the heat source. ? Leave the heat on for 20-30 minutes. ? Remove the heat if your skin turns bright red. This is very important if you are unable to feel pain, heat, or cold. You may have a greater risk of getting burned.    General instructions  If you were given a medicine to help you relax (sedative) during your procedure, it can affect you for many hours. Do not drive or use machinery until your doctor says that it is safe.  For the first 24 hours after the procedure: ? Do not sign important documents. ? Do not drink alcohol. ? Do your daily activities more slowly than normal. ? Eat foods that are soft and easy to digest.  Take over-the-counter or prescription medicines only as told by your doctor.  Keep all follow-up visits as told by  your doctor. This is important. Contact a doctor if:  You have blood in your poop 2-3 days after the procedure. Get help right away if:  You have more than a small amount of blood in your poop.  You see large clumps of tissue (blood clots) in your poop.  Your belly is swollen.  You feel like you may vomit (nauseous).  You vomit.  You have a fever.  You have belly pain that gets worse, and medicine does not help your pain. Summary  After the procedure, it is common to have a small amount of blood in your poop. You may also have mild cramping and bloating in your belly.  If you were given a medicine to help you relax (sedative) during your procedure, it can affect you for many hours. Do not drive or use machinery until your doctor says that it is safe.  Get help right away if you have a lot of blood in your poop, feel like you may vomit, have a fever, or have more belly pain. This information is not intended to replace advice given to you by your health care provider. Make sure you discuss any questions you have with your health care provider. Document Revised: 07/29/2019 Document Reviewed: 04/18/2019 Elsevier Patient Education  [place].

## 2021-03-05 NOTE — H&P (Signed)
Scott Jacobson is an 70 y.o. male.   Chief Complaint: screening colonoscopy HPI: 70 y/o with past medical history of BPH, coronary artery disease, GERD, hypertension, prior MI,  coming for screening colonoscopy.  Last colonoscopy was performed more than 10 years ago.  The patient denies having any complaints such as melena, hematochezia, abdominal pain or distention, change in her bowel movement consistency or frequency, no changes in her weight recently.  No family history of colorectal cancer.   Past Medical History:  Diagnosis Date  . Anginal pain ([place])   . Arthritis    "probably"  . BPH (benign prostatic hyperplasia)   . Coronary artery disease   . Dyspnea   . GERD (gastroesophageal reflux disease)   . History of kidney stones   . Hypertension   . Kidney stones   . Macular degeneration   . Myocardial infarction ([place])   . Seasonal allergies     Past Surgical History:  Procedure Laterality Date  . APPENDECTOMY    . BACK SURGERY    . CARDIAC CATHETERIZATION N/A 08/21/2016   Procedure: Left Heart Cath and Coronary Angiography;  Surgeon: Burnell Blanks, MD;  Location: [facility];  Service: Cardiovascular;  Laterality: N/A;  . CARDIAC CATHETERIZATION N/A 08/21/2016   Procedure: Coronary Stent Intervention;  Surgeon: Burnell Blanks, MD;  Location: [facility];  Service: Cardiovascular;  Laterality: N/A;  . CARDIAC SURGERY     quadruple bypass  . CATARACT EXTRACTION W/ INTRAOCULAR LENS  IMPLANT, BILATERAL Bilateral   . COLONOSCOPY    . CORONARY ARTERY BYPASS GRAFT N/A 10/03/2016   Procedure: CORONARY ARTERY BYPASS GRAFTING (CABG)x 4 with endoscopic harvesting of right saphenous vein ,LIMA-LAD SVG-DIAG SVG-OM SVG-RCA;  Surgeon: Gaye Pollack, MD;  Location: [facility];  Service: Open Heart Surgery;  Laterality: N/A;  . EYE SURGERY    . GAS INSERTION Right 02/05/2016   Procedure: INSERTION OF GAS-[id number];  Surgeon: Hayden Pedro, MD;  Location: [facility];  Service:  Ophthalmology;  Laterality: Right;  . HAND SURGERY Right    thumb; S/P "cut his arm"  . LASER PHOTO ABLATION Right 02/05/2016   Procedure: LASER PHOTO ABLATION-HEADSCOPE LASER;  Surgeon: Hayden Pedro, MD;  Location: [facility];  Service: Ophthalmology;  Laterality: Right;  . LUMBAR DISC SURGERY  ~ 2002  . RETINAL DETACHMENT REPAIR W/ SCLERAL BUCKLE LE Right 02/05/2016  . SCLERAL BUCKLE Right 02/05/2016   Procedure: SCLERAL BUCKLE;  Surgeon: Hayden Pedro, MD;  Location: [facility];  Service: Ophthalmology;  Laterality: Right;  . TEE WITHOUT CARDIOVERSION N/A 10/03/2016   Procedure: TRANSESOPHAGEAL ECHOCARDIOGRAM (TEE);  Surgeon: Gaye Pollack, MD;  Location: [facility];  Service: Open Heart Surgery;  Laterality: N/A;  . TONSILLECTOMY      Family History  Problem Relation Age of Onset  . Hypertension Mother   . Congestive Heart Failure Father    Social History:  reports that he quit smoking about 42 years ago. His smoking use included cigarettes. He has a 48.00 pack-year smoking history. He has never used smokeless tobacco. He reports current alcohol use. He reports that he does not use drugs.  Allergies: No Known Allergies  Medications Prior to Admission  Medication Sig Dispense Refill  . aspirin EC 81 MG tablet Take 1 tablet (81 mg total) by mouth daily. (Patient taking differently: Take 81 mg by mouth in the morning.)    . atorvastatin (LIPITOR) 40 MG tablet TAKE 1 TABLET BY MOUTH EVERY  DAY (Patient taking differently: Take 40 mg by mouth daily in the afternoon.) 90 tablet 2  . loratadine (CLARITIN) 10 MG tablet Take 10 mg by mouth every evening.    . metoprolol tartrate (LOPRESSOR) 25 MG tablet Take 1 tablet (25 mg total) by mouth 2 (two) times daily. 60 tablet 11  . Multiple Vitamins-Minerals (ICAPS MV PO) Take 1 tablet by mouth 2 (two) times daily.     . pantoprazole (PROTONIX) 40 MG tablet Take 40 mg by mouth daily before breakfast.    . Polyvinyl Alcohol-Povidone (REFRESH OP) Place 1 drop into  both eyes in the morning.    . ramipril (ALTACE) 5 MG capsule Take 5 mg by mouth in the morning.    . tamsulosin (FLOMAX) 0.4 MG CAPS capsule Take 0.4 mg by mouth in the morning.    . polyethylene glycol-electrolytes (TRILYTE) 420 g solution Take 4,000 mLs by mouth as directed. 4000 mL 0    No results found for this or any previous visit (from the past 48 hour(s)). No results found.  Review of Systems  Constitutional: Negative.   HENT: Negative.   Eyes: Negative.   Respiratory: Negative.   Cardiovascular: Negative.   Gastrointestinal: Negative.   Endocrine: Negative.   Genitourinary: Negative.   Musculoskeletal: Negative.   Skin: Negative.   Allergic/Immunologic: Negative.   Neurological: Negative.   Hematological: Negative.   Psychiatric/Behavioral: Negative.     Blood pressure (!) 158/91, pulse 88, temperature 98.1 F (36.7 C), temperature source Oral, resp. rate 13, height 5\' 9"  (1.753 m), weight 88 kg, SpO2 98 %. Physical Exam  GENERAL: The patient is AO x3, in no acute distress. HEENT: Head is normocephalic and atraumatic. EOMI are intact. Mouth is well hydrated and without lesions. NECK: Supple. No masses LUNGS: Clear to auscultation. No presence of rhonchi/wheezing/rales. Adequate chest expansion HEART: RRR, normal s1 and s2. ABDOMEN: Soft, nontender, no guarding, no peritoneal signs, and nondistended. BS +. No masses. EXTREMITIES: Without any cyanosis, clubbing, rash, lesions or edema. NEUROLOGIC: AOx3, no focal motor deficit. SKIN: no jaundice, no rashes  Assessment/Plan 70 y/o with past medical history of BPH, coronary artery disease, GERD, hypertension, prior MI,  coming for screening colonoscopy.  The patient is at average risk for colorectal cancer.  We will proceed with colonoscopy today.   Harvel Quale, MD 03/05/2021, 10:07 AM

## 2021-03-05 NOTE — Op Note (Addendum)
[facility] Patient Name: Scott Jacobson Procedure Date: 03/05/2021 10:05 AM MRN: [id number] Date of Birth: 04-Aug-1951 Attending MD: Maylon Peppers ,  CSN: [id number] Age: 70 Admit Type: Outpatient Procedure:                Colonoscopy Indications:              Screening for colorectal malignant neoplasm Providers:                Maylon Peppers, [place] Sharon Seller, RN, Charlsie Quest.                            Theda Sers RN, RN, Randa Spike, Technician Referring MD:              Medicines:                Monitored Anesthesia Care Complications:            No immediate complications. Estimated Blood Loss:     Estimated blood loss: none. Procedure:                Pre-Anesthesia Assessment:                           - Prior to the procedure, a History and Physical                            was performed, and patient medications, allergies                            and sensitivities were reviewed. The patient's                            tolerance of previous anesthesia was reviewed.                           - The risks and benefits of the procedure and the                            sedation options and risks were discussed with the                            patient. All questions were answered and informed                            consent was obtained.                           - ASA Grade Assessment: II - A patient with mild                            systemic disease.                           After obtaining informed consent, the colonoscope                            was passed under direct  vision. Throughout the                            procedure, the patient's blood pressure, pulse, and                            oxygen saturations were monitored continuously. The                            [id number]) was introduced through the anus                            and advanced to the the terminal ileum. The                            colonoscopy was performed without  difficulty. The                            patient tolerated the procedure well. The quality                            of the bowel preparation was excellent. Scope In: 10:13:53 AM Scope Out: 10:38:53 AM Scope Withdrawal Time: 0 hours 18 minutes 48 seconds  Total Procedure Duration: 0 hours 25 minutes 0 seconds  Findings:      The perianal and digital rectal examinations were normal.      The terminal ileum appeared normal.      Two sessile polyps were found in the ascending colon. The polyps were 4       to 5 mm in size. These polyps were removed with a cold snare. Resection       and retrieval were complete.      A 12 mm polyp was found in the sigmoid colon. The polyp was       semi-pedunculated. The polyp was removed with a hot snare. Resection and       retrieval were complete.      Multiple small and large-mouthed diverticula were found in the sigmoid       colon.      Non-bleeding internal hemorrhoids were found during retroflexion. Impression:               - The examined portion of the ileum was normal.                           - Two 4 to 5 mm polyps in the ascending colon,                            removed with a cold snare. Resected and retrieved.                           - One 12 mm polyp in the sigmoid colon, removed                            with a hot snare. Resected and retrieved.                           -  Diverticulosis in the sigmoid colon.                           - Non-bleeding internal hemorrhoids. Moderate Sedation:      Per Anesthesia Care Recommendation:           - Discharge patient to home (ambulatory).                           - Resume previous diet.                           - Await pathology results.                           - Repeat colonoscopy in 3 years for surveillance. Procedure Code(s):        --- Professional ---                           [phone], Colonoscopy, flexible; with removal of                            tumor(s), polyp(s), or other  lesion(s) by snare                            technique Diagnosis Code(s):        --- Professional ---                           K63.5, Polyp of colon                           Z12.11, Encounter for screening for malignant                            neoplasm of colon                           K64.8, Other hemorrhoids                           K57.30, Diverticulosis of large intestine without                            perforation or abscess without bleeding CPT copyright 2019 American Medical Association. All rights reserved. The codes documented in this report are preliminary and upon coder review may  be revised to meet current compliance requirements. Maylon Peppers, MD Maylon Peppers,  03/05/2021 10:42:17 AM This report has been signed electronically. Number of Addenda: 0

## 2021-03-05 NOTE — Anesthesia Postprocedure Evaluation (Signed)
Anesthesia Post Note  Patient: Scott Jacobson  Procedure(s) Performed: COLONOSCOPY WITH PROPOFOL (N/A ) POLYPECTOMY  Patient location during evaluation: Phase II Anesthesia Type: General Level of consciousness: awake Pain management: pain level controlled Vital Signs Assessment: post-procedure vital signs reviewed and stable Respiratory status: spontaneous breathing and respiratory function stable Cardiovascular status: blood pressure returned to baseline and stable Postop Assessment: no headache and no apparent nausea or vomiting Anesthetic complications: no Comments: Late entry   No complications documented.   Last Vitals:  Vitals:   03/05/21 0943 03/05/21 1042  BP: (!) 158/91 (!) 113/56  Pulse: 88   Resp: 13 16  Temp: 36.7 C 36.4 C  SpO2: 98% 93%    Last Pain:  Vitals:   03/05/21 1044  TempSrc:   PainSc: 0-No pain                 Louann Sjogren

## 2021-03-06 DIAGNOSIS — Z0000 Encounter for general adult medical examination without abnormal findings: Secondary | ICD-10-CM | POA: Diagnosis not present

## 2021-03-06 DIAGNOSIS — K2100 Gastro-esophageal reflux disease with esophagitis, without bleeding: Secondary | ICD-10-CM | POA: Diagnosis not present

## 2021-03-06 DIAGNOSIS — I10 Essential (primary) hypertension: Secondary | ICD-10-CM | POA: Diagnosis not present

## 2021-03-06 DIAGNOSIS — I25798 Atherosclerosis of other coronary artery bypass graft(s) with other forms of angina pectoris: Secondary | ICD-10-CM | POA: Diagnosis not present

## 2021-03-06 LAB — SURGICAL PATHOLOGY

## 2021-03-07 ENCOUNTER — Encounter (INDEPENDENT_AMBULATORY_CARE_PROVIDER_SITE_OTHER): Payer: Self-pay | Admitting: *Deleted

## 2021-03-12 ENCOUNTER — Encounter (HOSPITAL_COMMUNITY): Payer: Self-pay | Admitting: Gastroenterology

## 2021-04-04 DIAGNOSIS — K2100 Gastro-esophageal reflux disease with esophagitis, without bleeding: Secondary | ICD-10-CM | POA: Diagnosis not present

## 2021-04-04 DIAGNOSIS — I10 Essential (primary) hypertension: Secondary | ICD-10-CM | POA: Diagnosis not present

## 2021-05-16 DIAGNOSIS — M810 Age-related osteoporosis without current pathological fracture: Secondary | ICD-10-CM | POA: Diagnosis not present

## 2021-05-16 DIAGNOSIS — Z13820 Encounter for screening for osteoporosis: Secondary | ICD-10-CM | POA: Diagnosis not present

## 2021-06-05 DIAGNOSIS — I25798 Atherosclerosis of other coronary artery bypass graft(s) with other forms of angina pectoris: Secondary | ICD-10-CM | POA: Diagnosis not present

## 2021-06-05 DIAGNOSIS — K2100 Gastro-esophageal reflux disease with esophagitis, without bleeding: Secondary | ICD-10-CM | POA: Diagnosis not present

## 2021-06-05 DIAGNOSIS — I10 Essential (primary) hypertension: Secondary | ICD-10-CM | POA: Diagnosis not present

## 2021-06-13 DIAGNOSIS — Z961 Presence of intraocular lens: Secondary | ICD-10-CM | POA: Diagnosis not present

## 2021-06-13 DIAGNOSIS — H40023 Open angle with borderline findings, high risk, bilateral: Secondary | ICD-10-CM | POA: Diagnosis not present

## 2021-06-28 ENCOUNTER — Other Ambulatory Visit: Payer: Self-pay

## 2021-06-28 ENCOUNTER — Encounter (INDEPENDENT_AMBULATORY_CARE_PROVIDER_SITE_OTHER): Payer: Medicare Other | Admitting: Ophthalmology

## 2021-06-28 DIAGNOSIS — H43813 Vitreous degeneration, bilateral: Secondary | ICD-10-CM

## 2021-06-28 DIAGNOSIS — H353132 Nonexudative age-related macular degeneration, bilateral, intermediate dry stage: Secondary | ICD-10-CM

## 2021-06-28 DIAGNOSIS — H338 Other retinal detachments: Secondary | ICD-10-CM

## 2021-06-28 DIAGNOSIS — I10 Essential (primary) hypertension: Secondary | ICD-10-CM | POA: Diagnosis not present

## 2021-06-28 DIAGNOSIS — H35033 Hypertensive retinopathy, bilateral: Secondary | ICD-10-CM

## 2021-07-08 DIAGNOSIS — I25798 Atherosclerosis of other coronary artery bypass graft(s) with other forms of angina pectoris: Secondary | ICD-10-CM | POA: Diagnosis not present

## 2021-07-08 DIAGNOSIS — K2100 Gastro-esophageal reflux disease with esophagitis, without bleeding: Secondary | ICD-10-CM | POA: Diagnosis not present

## 2021-07-08 DIAGNOSIS — Z8673 Personal history of transient ischemic attack (TIA), and cerebral infarction without residual deficits: Secondary | ICD-10-CM | POA: Diagnosis not present

## 2021-07-08 DIAGNOSIS — Z0000 Encounter for general adult medical examination without abnormal findings: Secondary | ICD-10-CM | POA: Diagnosis not present

## 2021-07-08 DIAGNOSIS — I10 Essential (primary) hypertension: Secondary | ICD-10-CM | POA: Diagnosis not present

## 2021-08-05 DIAGNOSIS — K2100 Gastro-esophageal reflux disease with esophagitis, without bleeding: Secondary | ICD-10-CM | POA: Diagnosis not present

## 2021-08-05 DIAGNOSIS — I10 Essential (primary) hypertension: Secondary | ICD-10-CM | POA: Diagnosis not present

## 2021-09-04 DIAGNOSIS — I10 Essential (primary) hypertension: Secondary | ICD-10-CM | POA: Diagnosis not present

## 2021-09-04 DIAGNOSIS — K2100 Gastro-esophageal reflux disease with esophagitis, without bleeding: Secondary | ICD-10-CM | POA: Diagnosis not present

## 2021-09-19 DIAGNOSIS — L570 Actinic keratosis: Secondary | ICD-10-CM | POA: Diagnosis not present

## 2021-09-19 DIAGNOSIS — L218 Other seborrheic dermatitis: Secondary | ICD-10-CM | POA: Diagnosis not present

## 2021-09-19 DIAGNOSIS — D225 Melanocytic nevi of trunk: Secondary | ICD-10-CM | POA: Diagnosis not present

## 2021-09-19 DIAGNOSIS — L821 Other seborrheic keratosis: Secondary | ICD-10-CM | POA: Diagnosis not present

## 2021-09-19 DIAGNOSIS — D2271 Melanocytic nevi of right lower limb, including hip: Secondary | ICD-10-CM | POA: Diagnosis not present

## 2021-09-19 DIAGNOSIS — L814 Other melanin hyperpigmentation: Secondary | ICD-10-CM | POA: Diagnosis not present

## 2021-10-01 DIAGNOSIS — I639 Cerebral infarction, unspecified: Secondary | ICD-10-CM | POA: Insufficient documentation

## 2021-10-01 DIAGNOSIS — G458 Other transient cerebral ischemic attacks and related syndromes: Secondary | ICD-10-CM | POA: Diagnosis not present

## 2021-10-02 DIAGNOSIS — Z8673 Personal history of transient ischemic attack (TIA), and cerebral infarction without residual deficits: Secondary | ICD-10-CM | POA: Diagnosis not present

## 2021-10-02 DIAGNOSIS — G458 Other transient cerebral ischemic attacks and related syndromes: Secondary | ICD-10-CM | POA: Diagnosis not present

## 2021-10-02 DIAGNOSIS — I6523 Occlusion and stenosis of bilateral carotid arteries: Secondary | ICD-10-CM | POA: Diagnosis not present

## 2021-10-04 ENCOUNTER — Other Ambulatory Visit: Payer: Self-pay

## 2021-10-04 ENCOUNTER — Encounter: Payer: Self-pay | Admitting: Vascular Surgery

## 2021-10-04 ENCOUNTER — Ambulatory Visit: Payer: Medicare Other | Admitting: Vascular Surgery

## 2021-10-04 VITALS — BP 129/71 | HR 75 | Temp 98.2°F | Resp 20 | Ht 69.0 in | Wt 200.0 lb

## 2021-10-04 DIAGNOSIS — I6523 Occlusion and stenosis of bilateral carotid arteries: Secondary | ICD-10-CM | POA: Diagnosis not present

## 2021-10-04 NOTE — Progress Notes (Signed)
Office Note     CC: Concern for carotid artery stenosis Requesting Provider:  Neale Burly, MD  HPI: Scott Jacobson is a 70 y.o. (06-27-1951) male presenting at the request of .Neale Burly, MD with concern for carotid artery stenosis.  On Tuesday, the patient had an episode of left-sided facial numbness and weakness.  He immediately called his PCP who saw him in the office and prescribed dual antiplatelet therapy.  A carotid duplex ultrasound was ordered demonstrating bilateral carotid stenosis, and therefore the patient was referred to vascular surgery.  On exam today, Scott Jacobson was accompanied by his wife.  The left-sided facial numbness he initially appreciated resolved within a few hours from its onset, but he still noted facial weakness that was most appreciated when trying to smile.  He denied vision changes, speech changes, difficulty swallowing, numbness or tingling in his extremities.   Scott Jacobson denies history of shingles, recent medication changes, tick bites, migraines.  The pt is  on a statin for cholesterol management.  The pt is  on a daily aspirin.   Other AC:  plavix The pt is on medication for hypertension.   The pt is not diabetic.  Tobacco hx:  former  Past Medical History:  Diagnosis Date   Anginal pain ([place])    Arthritis    "probably"   BPH (benign prostatic hyperplasia)    Carotid artery occlusion    Coronary artery disease    Dyspnea    GERD (gastroesophageal reflux disease)    History of kidney stones    Hypertension    Kidney stones    Macular degeneration    Myocardial infarction ([place])    Seasonal allergies     Past Surgical History:  Procedure Laterality Date   APPENDECTOMY     BACK SURGERY     CARDIAC CATHETERIZATION N/A 08/21/2016   Procedure: Left Heart Cath and Coronary Angiography;  Surgeon: Burnell Blanks, MD;  Location: [facility];  Service: Cardiovascular;  Laterality: N/A;   CARDIAC CATHETERIZATION N/A 08/21/2016    Procedure: Coronary Stent Intervention;  Surgeon: Burnell Blanks, MD;  Location: [facility];  Service: Cardiovascular;  Laterality: N/A;   CARDIAC SURGERY     quadruple bypass   CATARACT EXTRACTION W/ INTRAOCULAR LENS  IMPLANT, BILATERAL Bilateral    COLONOSCOPY     COLONOSCOPY WITH PROPOFOL N/A 03/05/2021   Procedure: COLONOSCOPY WITH PROPOFOL;  Surgeon: Harvel Quale, MD;  [facility];  Service: Gastroenterology;  Laterality: N/A;  11:00   CORONARY ARTERY BYPASS GRAFT N/A 10/03/2016   Procedure: CORONARY ARTERY BYPASS GRAFTING (CABG)x 4 with endoscopic harvesting of right saphenous vein ,LIMA-LAD SVG-DIAG SVG-OM SVG-RCA;  Surgeon: Gaye Pollack, MD;  Location: [facility];  Service: Open Heart Surgery;  Laterality: N/A;   EYE SURGERY     GAS INSERTION Right 02/05/2016   Procedure: INSERTION OF GAS-[id number];  Surgeon: Hayden Pedro, MD;  Location: [facility];  Service: Ophthalmology;  Laterality: Right;   HAND SURGERY Right    thumb; S/P "cut his arm"   LASER PHOTO ABLATION Right 02/05/2016   Procedure: LASER PHOTO ABLATION-HEADSCOPE LASER;  Surgeon: Hayden Pedro, MD;  Location: [facility];  Service: Ophthalmology;  Laterality: Right;   LUMBAR DISC SURGERY  ~ 2002   POLYPECTOMY  03/05/2021   Procedure: POLYPECTOMY;  Surgeon: Harvel Quale, MD;  [facility];  Service: Gastroenterology;;  ascending colon, sigmoid colon   RETINAL DETACHMENT REPAIR W/  SCLERAL BUCKLE LE Right 02/05/2016   SCLERAL BUCKLE Right 02/05/2016   Procedure: SCLERAL BUCKLE;  Surgeon: Hayden Pedro, MD;  Location: [facility];  Service: Ophthalmology;  Laterality: Right;   TEE WITHOUT CARDIOVERSION N/A 10/03/2016   Procedure: TRANSESOPHAGEAL ECHOCARDIOGRAM (TEE);  Surgeon: Gaye Pollack, MD;  Location: [facility];  Service: Open Heart Surgery;  Laterality: N/A;   TONSILLECTOMY      Social History   Socioeconomic History   Marital status: Married    Spouse name: Not on file   Number  of children: Not on file   Years of education: Not on file   Highest education level: Not on file  Occupational History   Not on file  Tobacco Use   Smoking status: Former    Packs/day: 3.00    Years: 16.00    Pack years: 48.00    Types: Cigarettes    Quit date: 02/04/1979    Years since quitting: 42.6   Smokeless tobacco: Never  Vaping Use   Vaping Use: Never used  Substance and Sexual Activity   Alcohol use: Yes    Comment: 02/06/2016 "a beer a couple times/month"   Drug use: No   Sexual activity: Not Currently  Other Topics Concern   Not on file  Social History Narrative   Not on file   Social Determinants of Health   Financial Resource Strain: Not on file  Food Insecurity: Not on file  Transportation Needs: Not on file  Physical Activity: Not on file  Stress: Not on file  Social Connections: Not on file  Intimate Partner Violence: Not on file    Family History  Problem Relation Age of Onset   Hypertension Mother    Congestive Heart Failure Father     Current Outpatient Medications  Medication Sig Dispense Refill   aspirin EC 81 MG tablet Take 1 tablet (81 mg total) by mouth daily. (Patient taking differently: Take 81 mg by mouth in the morning.)     atorvastatin (LIPITOR) 40 MG tablet TAKE 1 TABLET BY MOUTH EVERY DAY (Patient taking differently: Take 40 mg by mouth daily in the afternoon.) 90 tablet 2   clopidogrel (PLAVIX) 75 MG tablet Take 75 mg by mouth daily.     loratadine (CLARITIN) 10 MG tablet Take 10 mg by mouth every evening.     metoprolol tartrate (LOPRESSOR) 25 MG tablet Take 1 tablet (25 mg total) by mouth 2 (two) times daily. 60 tablet 11   Multiple Vitamins-Minerals (ICAPS MV PO) Take 1 tablet by mouth 2 (two) times daily.      pantoprazole (PROTONIX) 40 MG tablet Take 40 mg by mouth daily before breakfast.     Polyvinyl Alcohol-Povidone (REFRESH OP) Place 1 drop into both eyes in the morning.     ramipril (ALTACE) 5 MG capsule Take 5 mg by mouth  in the morning.     tamsulosin (FLOMAX) 0.4 MG CAPS capsule Take 0.4 mg by mouth in the morning.     No current facility-administered medications for this visit.    No Known Allergies   REVIEW OF SYSTEMS:   [X]  denotes positive finding, [ ]  denotes negative finding Cardiac  Comments:  Chest pain or chest pressure:    Shortness of breath upon exertion:    Short of breath when lying flat:    Irregular heart rhythm:        Vascular    Pain in calf, thigh, or hip brought on by ambulation:  Pain in feet at night that wakes you up from your sleep:     Blood clot in your veins:    Leg swelling:         Pulmonary    Oxygen at home:    Productive cough:     Wheezing:         Neurologic    Sudden weakness in arms or legs:     Sudden numbness in arms or legs:     Sudden onset of difficulty speaking or slurred speech:    Temporary loss of vision in one eye:     Problems with dizziness:         Gastrointestinal    Blood in stool:     Vomited blood:         Genitourinary    Burning when urinating:     Blood in urine:        Psychiatric    Major depression:         Hematologic    Bleeding problems:    Problems with blood clotting too easily:        Skin    Rashes or ulcers:        Constitutional    Fever or chills:      PHYSICAL EXAMINATION:  Vitals:   10/04/21 1134  BP: 129/71  Pulse: 75  Resp: 20  Temp: 98.2 F (36.8 C)  SpO2: 96%  Weight: 200 lb (90.7 kg)  Height: 5\' 9"  (1.753 m)    General:  WDWN in NAD; vital signs documented above Gait: Not observed HENT: WNL, normocephalic Pulmonary: normal non-labored breathing , without wheezing Cardiac: regular HR, Abdomen: soft, NT, no masses Skin: without rashes Vascular Exam/Pulses:  Right Left  Radial 2+ (normal) 2+ (normal)  Ulnar 2+ (normal) 2+ (normal)  Femoral    Popliteal    DP 2+ (normal) 2+ (normal)  PT 2+ (normal) 2+ (normal)   Extremities: without ischemic changes, without Gangrene ,  without cellulitis; without open wounds;  Musculoskeletal: no muscle wasting or atrophy  Neurologic: A&O X 3;  No focal weakness or paresthesias are detected Psychiatric:  The pt has Normal affect.   Non-Invasive Vascular Imaging:       ASSESSMENT/PLAN: KERWIN AUGUSTUS is a 70 y.o. male presenting with left-sided facial symptoms concerning for left hemispheric stroke occurring three days ago on 10/01/21. Pt with continued left facial weakness. He has no imaging other than a carotid duplex ultrasound which demonstrates critical stenosis on the right with moderate ICA stenosis on the left.    Interestingly, the patient's stroke-like symptoms are on the left - the side with less stenosis.  Scott Jacobson would benefit from cerebral imaging and stat neurology referral to further characterize and evaluate his recent symptoms.  I had a long conversation with Scott Jacobson and his wife regarding the above.  Most pressing is to define whether or not Scott Jacobson had a left-sided stroke and if so, what the possible etiology is / the percent stenosis of the left internal carotid artery.  Should this be greater than 50% stenosis, Scott Jacobson would benefit from carotid revascularization to decrease his stroke risk. If less than 50%, he would not benefit from left internal carotid artery revascularization.  Regarding the patient's right internal carotid artery, recent carotid ultrasound demonstrated right internal carotid artery critical stenosis.  This is not associated with the patient's recent symptoms, and therefore would be considered asymptomatic carotid artery stenosis.  Imaging to characterize his recent symptoms and  possible stroke will help to further define the left-sided stenosis as well.  While he will benefit from carotid revascularization of the right internal carotid artery, his risk of stroke at 5 years with medical management alone is 11%, and with surgery is decreased to 5%.  Right internal carotid artery revascularization will  be elective and will follow the left carotid artery, should this be needed first.  I asked that Kairyn continue his current medical management which includes dual antiplatelet therapy and high intensity statin.  Should symptoms recur or worsen, I asked that he seek immediate care in an emergency department.  I have placed a stat referral to neurology for evaluation as well as ordered CT angio head and neck and MRI head with and without contrast.  I will continue to follow the patient's progress and will provide further recommendations once imaging and neurology evaluation of been completed.   Scott Justo, MD [facility] [phone]

## 2021-10-07 ENCOUNTER — Other Ambulatory Visit: Payer: Self-pay

## 2021-10-07 DIAGNOSIS — I618 Other nontraumatic intracerebral hemorrhage: Secondary | ICD-10-CM

## 2021-10-11 ENCOUNTER — Other Ambulatory Visit: Payer: Self-pay | Admitting: Vascular Surgery

## 2021-10-11 DIAGNOSIS — I618 Other nontraumatic intracerebral hemorrhage: Secondary | ICD-10-CM | POA: Diagnosis not present

## 2021-10-12 LAB — BUN+CREAT
BUN/Creatinine Ratio: 20 (ref 10–24)
BUN: 17 mg/dL (ref 8–27)
Creatinine, Ser: 0.85 mg/dL (ref 0.76–1.27)
eGFR: 93 mL/min/{1.73_m2} (ref >59)

## 2021-10-15 DIAGNOSIS — I25798 Atherosclerosis of other coronary artery bypass graft(s) with other forms of angina pectoris: Secondary | ICD-10-CM | POA: Diagnosis not present

## 2021-10-15 DIAGNOSIS — Z0000 Encounter for general adult medical examination without abnormal findings: Secondary | ICD-10-CM | POA: Diagnosis not present

## 2021-10-15 DIAGNOSIS — Z8673 Personal history of transient ischemic attack (TIA), and cerebral infarction without residual deficits: Secondary | ICD-10-CM | POA: Diagnosis not present

## 2021-10-15 DIAGNOSIS — K2100 Gastro-esophageal reflux disease with esophagitis, without bleeding: Secondary | ICD-10-CM | POA: Diagnosis not present

## 2021-10-16 ENCOUNTER — Other Ambulatory Visit: Payer: Self-pay

## 2021-10-16 ENCOUNTER — Ambulatory Visit (HOSPITAL_COMMUNITY)
Admission: RE | Admit: 2021-10-16 | Discharge: 2021-10-16 | Disposition: A | Discharge status: Home / Self Care | Payer: Medicare Other | Source: Ambulatory Visit | Attending: Vascular Surgery | Admitting: Vascular Surgery

## 2021-10-16 DIAGNOSIS — I618 Other nontraumatic intracerebral hemorrhage: Secondary | ICD-10-CM | POA: Diagnosis not present

## 2021-10-16 DIAGNOSIS — I639 Cerebral infarction, unspecified: Secondary | ICD-10-CM | POA: Diagnosis not present

## 2021-10-16 DIAGNOSIS — R200 Anesthesia of skin: Secondary | ICD-10-CM | POA: Diagnosis not present

## 2021-10-16 MED ORDER — IOHEXOL 350 MG/ML SOLN
80.0000 mL | Freq: Once | INTRAVENOUS | Status: AC | PRN
  Administered 2021-10-16: 80 mL via INTRAVENOUS

## 2021-10-16 MED ORDER — GADOBUTROL 1 MMOL/ML IV SOLN
10.0000 mL | Freq: Once | INTRAVENOUS | Status: AC | PRN
  Administered 2021-10-16: 10 mL via INTRAVENOUS

## 2021-10-31 ENCOUNTER — Ambulatory Visit: Payer: Medicare Other | Admitting: Neurology

## 2021-10-31 ENCOUNTER — Encounter: Payer: Self-pay | Admitting: Neurology

## 2021-10-31 VITALS — BP 142/68 | HR 63 | Ht 69.0 in | Wt 206.2 lb

## 2021-10-31 DIAGNOSIS — R200 Anesthesia of skin: Secondary | ICD-10-CM | POA: Diagnosis not present

## 2021-10-31 DIAGNOSIS — E7800 Pure hypercholesterolemia, unspecified: Secondary | ICD-10-CM | POA: Diagnosis not present

## 2021-10-31 DIAGNOSIS — I6523 Occlusion and stenosis of bilateral carotid arteries: Secondary | ICD-10-CM

## 2021-10-31 DIAGNOSIS — R202 Paresthesia of skin: Secondary | ICD-10-CM

## 2021-10-31 DIAGNOSIS — R7309 Other abnormal glucose: Secondary | ICD-10-CM | POA: Diagnosis not present

## 2021-10-31 DIAGNOSIS — R799 Abnormal finding of blood chemistry, unspecified: Secondary | ICD-10-CM | POA: Diagnosis not present

## 2021-10-31 NOTE — Patient Instructions (Signed)
I had a long d/w patient and his wife about his recent left facial numbness and decreased hearing likely represent small brainstem infarct not visualized on MRI as it was done 2 weeks later.  He also has moderate right carotid stenosis and an MRI showing what appeared to be silent small parieto-occipital strokes which could be related we also discussed, risk for recurrent stroke/TIAs, personally independently reviewed imaging studies and stroke evaluation results and answered questions.Continue Plavix 75 mg daily alone and stop aspirin now for secondary stroke prevention and maintain strict control of hypertension with blood pressure goal below 130/90, diabetes with hemoglobin A1c goal below 6.5% and lipids with LDL cholesterol goal below 70 mg/dL. I also advised the patient to eat a healthy diet with plenty of whole grains, cereals, fruits and vegetables, exercise regularly and maintain ideal body weight .patient may consider elective right carotid revascularization given the fact that he has had some silent strokes in that distribution.  Check lipid profile and hemoglobin A1c.  Followup in the future with me in 3 months or call earlier if necessary. Stroke Prevention Some medical conditions and behaviors can lead to a higher chance of having a stroke. You can help prevent a stroke by eating healthy, exercising, not smoking, and managing any medical conditions you have. Stroke is a leading cause of functional impairment. Primary prevention is particularly important because a majority of strokes are first-time events. Stroke changes the lives of not only those who experience a stroke but also their family and other caregivers. How can this condition affect me? A stroke is a medical emergency and should be treated right away. A stroke can lead to brain damage and can sometimes be life-threatening. If a person gets medical treatment right away, there is a better chance of surviving and recovering from a  stroke. What can increase my risk? The following medical conditions may increase your risk of a stroke: Cardiovascular disease. High blood pressure (hypertension). Diabetes. High cholesterol. Sickle cell disease. Blood clotting disorders (hypercoagulable state). Obesity. Sleep disorders (obstructive sleep apnea). Other risk factors include: Being older than age 81. Having a history of blood clots, stroke, or mini-stroke (transient ischemic attack, TIA). Genetic factors, such as race, ethnicity, or a family history of stroke. Smoking cigarettes or using other tobacco products. Taking birth control pills, especially if you also use tobacco. Heavy use of alcohol or drugs, especially cocaine and methamphetamine. Physical inactivity. What actions can I take to prevent this? Manage your health conditions High cholesterol levels. Eating a healthy diet is important for preventing high cholesterol. If cholesterol cannot be managed through diet alone, you may need to take medicines. Take any prescribed medicines to control your cholesterol as told by your health care provider. Hypertension. To reduce your risk of stroke, try to keep your blood pressure below 130/80. Eating a healthy diet and exercising regularly are important for controlling blood pressure. If these steps are not enough to manage your blood pressure, you may need to take medicines. Take any prescribed medicines to control hypertension as told by your health care provider. Ask your health care provider if you should monitor your blood pressure at home. Have your blood pressure checked every year, even if your blood pressure is normal. Blood pressure increases with age and some medical conditions. Diabetes. Eating a healthy diet and exercising regularly are important parts of managing your blood sugar (glucose). If your blood sugar cannot be managed through diet and exercise, you may need to take medicines. Take  any prescribed  medicines to control your diabetes as told by your health care provider. Get evaluated for obstructive sleep apnea. Talk to your health care provider about getting a sleep evaluation if you snore a lot or have excessive sleepiness. Make sure that any other medical conditions you have, such as atrial fibrillation or atherosclerosis, are managed. Nutrition Follow instructions from your health care provider about what to eat or drink to help manage your health condition. These instructions may include: Reducing your daily calorie intake. Limiting how much salt (sodium) you use to 1,500 milligrams (mg) each day. Using only healthy fats for cooking, such as olive oil, canola oil, or sunflower oil. Eating healthy foods. You can do this by: Choosing foods that are high in fiber, such as whole grains, and fresh fruits and vegetables. Eating at least 5 servings of fruits and vegetables a day. Try to fill one-half of your plate with fruits and vegetables at each meal. Choosing lean protein foods, such as lean cuts of meat, poultry without skin, fish, tofu, beans, and nuts. Eating low-fat dairy products. Avoiding foods that are high in sodium. This can help lower blood pressure. Avoiding foods that have saturated fat, trans fat, and cholesterol. This can help prevent high cholesterol. Avoiding processed and prepared foods. Counting your daily carbohydrate intake.  Lifestyle If you drink alcohol: Limit how much you have to: 0-1 drink a day for women who are not pregnant. 0-2 drinks a day for men. Know how much alcohol is in your drink. In the [place] one drink equals one 12 oz bottle of beer (343mL), one 5 oz glass of wine (144mL), or one 1 oz glass of hard liquor (12mL). Do not use any products that contain nicotine or tobacco. These products include cigarettes, chewing tobacco, and vaping devices, such as e-cigarettes. If you need help quitting, ask your health care provider. Avoid secondhand  smoke. Do not use drugs. Activity  Try to stay at a healthy weight. Get at least 30 minutes of exercise on most days, such as: Fast walking. Biking. Swimming. Medicines Take over-the-counter and prescription medicines only as told by your health care provider. Aspirin or blood thinners (antiplatelets or anticoagulants) may be recommended to reduce your risk of forming blood clots that can lead to stroke. Avoid taking birth control pills. Talk to your health care provider about the risks of taking birth control pills if: You are over 59 years old. You smoke. You get very bad headaches. You have had a blood clot. Where to find more information American Stroke Association: www.strokeassociation.org Get help right away if: You or a loved one has any symptoms of a stroke. "BE FAST" is an easy way to remember the main warning signs of a stroke: B - Balance. Signs are dizziness, sudden trouble walking, or loss of balance. E - Eyes. Signs are trouble seeing or a sudden change in vision. F - Face. Signs are sudden weakness or numbness of the face, or the face or eyelid drooping on one side. A - Arms. Signs are weakness or numbness in an arm. This happens suddenly and usually on one side of the body. S - Speech. Signs are sudden trouble speaking, slurred speech, or trouble understanding what people say. T - Time. Time to call emergency services. Write down what time symptoms started. You or a loved one has other signs of a stroke, such as: A sudden, severe headache with no known cause. Nausea or vomiting. Seizure. These symptoms may represent a  serious problem that is an emergency. Do not wait to see if the symptoms will go away. Get medical help right away. Call your local emergency services (911 in the [place] Do not drive yourself to the hospital. Summary You can help to prevent a stroke by eating healthy, exercising, not smoking, limiting alcohol intake, and managing any medical conditions  you may have. Do not use any products that contain nicotine or tobacco. These include cigarettes, chewing tobacco, and vaping devices, such as e-cigarettes. If you need help quitting, ask your health care provider. Remember "BE FAST" for warning signs of a stroke. Get help right away if you or a loved one has any of these signs. This information is not intended to replace advice given to you by your health care provider. Make sure you discuss any questions you have with your health care provider. Document Revised: 04/23/2020 Document Reviewed: 04/23/2020 Elsevier Patient Education  [place].

## 2021-10-31 NOTE — Progress Notes (Signed)
[name] [address] [place]. [place] [address] [phone]       OFFICE CONSULT NOTE  [name] Date of Birth:  1951-06-02 Medical Record Number:  [id number]   Referring MD:  Gwenlyn Saran  Reason for Referral:  stroke and carotid stenosis  HPI: Scott Jacobson is a 71 year old Caucasian male seen today for initial office consultation visit.  Is accompanied by his wife.  History is obtained from them and review of electronic medical records and reviewed pertinent available imaging films in PACS.  He has past medical history of hypertension, myocardial infarction, coronary artery disease, prostatic hypertrophy, arthritis and kidney stones.  Patient states on 10/01/2021 he developed sudden onset of numbness and his left face.  He states this involves mostly his cheeks and lips up to the midline.  It also involves his temples to mild degree but is not sure if it involves his forehead or eyelids.  He also noticed some ringing sound in his left ear.  He denied any facial weakness, slurred speech, extremity weakness or numbness.  A few days prior he had noticed some numbness in his left hand which she did not stay long.  He denies any focal extremity weakness, gait or balance problems, headache, loss of vision, double vision or vertigo.  He does have a remote history of TIA about 20 years ago when he had some speech difficulties for a few days.  He was seen in the ER and sent home with placed on aspirin.  He had carotid ultrasound done as an outpatient which showed 79 9% right ICA stenosis with heterogeneous calcified plaque as well as 50% left ICA stenosis.  He was seen by Dr. Unk Lightning who is a vascular surgeon who felt his symptoms of left facial numbness did not go along with right carotid stenosis and sent the patient to me for my opinion.  Patient underwent CT angiogram of the brain and neck on 10/16/2021 which showed 60% right ICA stenosis and 50% left ICA stenosis.  Left P2  posterior cerebral artery was occluded with CT scan showing area of vague hypodensity in the left hippocampus possibly subacute infarct.  Patient denies any symptoms of peripheral vision loss, short-term memory, gait or balance.  He was on aspirin and recently Plavix has been added as tolerating both medications well without bruising or bleeding.  His blood pressure is under good control and today it is 142/68.  He has been started on Lipitor 40 mg which is tolerating well without muscle aches or pains.  He denies any definitive symptoms of right hemispheric ischemia but MRI scan of the brain done on 10/16/2021 shows no acute infarcts but shows subtle FLAIR and T2 parietal and occipital cortical hyperintensities possibly subacute infarcts more on the right than the left.  Patient denies any history of atrial fibrillation, palpitations, syncope or passing out.  ROS:   14 system review of systems is positive for facial numbness, ringing in the ears, decreased hearing and all other systems negative  PMH:  Past Medical History:  Diagnosis Date   Anginal pain ([place])    Arthritis    "probably"   BPH (benign prostatic hyperplasia)    Carotid artery occlusion    Coronary artery disease    Dyspnea    GERD (gastroesophageal reflux disease)    History of kidney stones    Hypertension    Kidney stones    Macular degeneration    Myocardial infarction ([place])    Seasonal  allergies     Social History:  Social History   Socioeconomic History   Marital status: Married    Spouse name: Not on file   Number of children: Not on file   Years of education: 12   Highest education level: Not on file  Occupational History   Not on file  Tobacco Use   Smoking status: Former    Packs/day: 3.00    Years: 16.00    Pack years: 48.00    Types: Cigarettes    Quit date: 02/04/1979    Years since quitting: 42.7   Smokeless tobacco: Never  Vaping Use   Vaping Use: Never used  Substance and Sexual Activity    Alcohol use: Yes    Comment: 02/06/2016 "a beer a couple times/month"   Drug use: No   Sexual activity: Not Currently  Other Topics Concern   Not on file  Social History Narrative   Lives at home with his wife.   Step-children.   Right-handed.   3 cups caffeine per day.   Social Determinants of Health   Financial Resource Strain: Not on file  Food Insecurity: Not on file  Transportation Needs: Not on file  Physical Activity: Not on file  Stress: Not on file  Social Connections: Not on file  Intimate Partner Violence: Not on file    Medications:   Current Outpatient Medications on File Prior to Visit  Medication Sig Dispense Refill   aspirin EC 81 MG tablet Take 1 tablet (81 mg total) by mouth daily. (Patient taking differently: Take 81 mg by mouth in the morning.)     atorvastatin (LIPITOR) 40 MG tablet TAKE 1 TABLET BY MOUTH EVERY DAY (Patient taking differently: Take 40 mg by mouth daily in the afternoon.) 90 tablet 2   clopidogrel (PLAVIX) 75 MG tablet Take 75 mg by mouth daily.     loratadine (CLARITIN) 10 MG tablet Take 10 mg by mouth every evening.     metoprolol tartrate (LOPRESSOR) 25 MG tablet Take 1 tablet (25 mg total) by mouth 2 (two) times daily. 60 tablet 11   Multiple Vitamins-Minerals (ICAPS MV PO) Take 1 tablet by mouth 2 (two) times daily.      pantoprazole (PROTONIX) 40 MG tablet Take 40 mg by mouth daily before breakfast.     Polyvinyl Alcohol-Povidone (REFRESH OP) Place 1 drop into both eyes in the morning.     ramipril (ALTACE) 5 MG capsule Take 5 mg by mouth in the morning.     tamsulosin (FLOMAX) 0.4 MG CAPS capsule Take 0.4 mg by mouth in the morning.     No current facility-administered medications on file prior to visit.    Allergies:  No Known Allergies  Physical Exam General: well developed, well nourished elderly Caucasian male, seated, in no evident distress Head: head normocephalic and atraumatic.   Neck: supple with no carotid or  supraclavicular bruits Cardiovascular: regular rate and rhythm, no murmurs Musculoskeletal: no deformity Skin:  no rash/petichiae Vascular:  Normal pulses all extremities  Neurologic Exam Mental Status: Awake and fully alert. Oriented to place and time. Recent and remote memory intact. Attention span, concentration and fund of knowledge appropriate. Mood and affect appropriate.  Cranial Nerves: Fundoscopic exam reveals sharp disc margins. Pupils equal, briskly reactive to light. Extraocular movements full without nystagmus. Visual fields full to confrontation. Hearing slightly diminished on the left.  Air conduction patient is still greater than bone conduction.. Facial sensation intact. Face, tongue, palate moves normally and  symmetrically.  Subjective left cheek numbness. Motor: Normal bulk and tone. Normal strength in all tested extremity muscles. Sensory.: intact to touch , pinprick , position and vibratory sensation.  Coordination: Rapid alternating movements normal in all extremities. Finger-to-nose and heel-to-shin performed accurately bilaterally. Gait and Station: Arises from chair without difficulty. Stance is normal. Gait demonstrates normal stride length and balance . Able to heel, toe and tandem walk with mild difficulty.  Reflexes: 1+ and symmetric. Toes downgoing.   NIHSS  1 Modified Rankin  2   ASSESSMENT: 71 year old Caucasian male with sudden onset of left facial numbness and some hearing loss possibly from small brainstem infarct not visualized on MRI as it was done 2 weeks later likely small vessel disease but abnormal neurovascular studies suggesting moderate right carotid, mild left carotid and severe intracranial left PCA atherosclerotic disease.  Vascular risk factors of hyperlipidemia and hypertension and intra and extracranial atherosclerosis.  Abnormal MRI scan showing bilateral small cortical parieto-occipital silent infarcts     PLAN: I had a long d/w patient and  his wife about his recent left facial numbness and decreased hearing likely represent small brainstem infarct not visualized on MRI as it was done 2 weeks later.  He also has moderate right carotid stenosis and an MRI showing what appeared to be silent small parieto-occipital strokes which could be related we also discussed, risk for recurrent stroke/TIAs, personally independently reviewed imaging studies and stroke evaluation results and answered questions.Continue Plavix 75 mg daily alone and stop aspirin now for secondary stroke prevention and maintain strict control of hypertension with blood pressure goal below 130/90, diabetes with hemoglobin A1c goal below 6.5% and lipids with LDL cholesterol goal below 70 mg/dL. I also advised the patient to eat a healthy diet with plenty of whole grains, cereals, fruits and vegetables, exercise regularly and maintain ideal body weight .patient may consider elective right carotid revascularization given the fact that he has had some silent strokes in that distribution.  Check lipid profile and hemoglobin A1c.  Followup in the future with me in 3 months or call earlier if necessary.  Check 30-day heart monitor for paroxysmal A. fib.  Greater than 50% time during this 45-minute consultation visit was spent on counseling and coordination of care about his numbness and carotid stenosis and discussion about stroke risk, prevention and treatment and answering questions. Antony Contras, MD  Note: This document was prepared with digital dictation and possible smart phrase technology. Any transcriptional errors that result from this process are unintentional.

## 2021-11-01 LAB — LIPID PANEL
Chol/HDL Ratio: 2.8 ratio (ref 0.0–5.0)
Cholesterol, Total: 137 mg/dL (ref 100–199)
HDL: 49 mg/dL (ref >39)
LDL Chol Calc (NIH): 56 mg/dL (ref 0–99)
Triglycerides: 197 mg/dL — ABNORMAL HIGH (ref 0–149)
VLDL Cholesterol Cal: 32 mg/dL (ref 5–40)

## 2021-11-01 LAB — HEMOGLOBIN A1C
Est. average glucose Bld gHb Est-mCnc: 117 mg/dL
Hgb A1c MFr Bld: 5.7 % — ABNORMAL HIGH (ref 4.8–5.6)

## 2021-11-05 DIAGNOSIS — I25798 Atherosclerosis of other coronary artery bypass graft(s) with other forms of angina pectoris: Secondary | ICD-10-CM | POA: Diagnosis not present

## 2021-11-05 DIAGNOSIS — K2100 Gastro-esophageal reflux disease with esophagitis, without bleeding: Secondary | ICD-10-CM | POA: Diagnosis not present

## 2021-11-07 ENCOUNTER — Ambulatory Visit (INDEPENDENT_AMBULATORY_CARE_PROVIDER_SITE_OTHER): Payer: Medicare Other

## 2021-11-07 DIAGNOSIS — R202 Paresthesia of skin: Secondary | ICD-10-CM | POA: Diagnosis not present

## 2021-11-07 DIAGNOSIS — R200 Anesthesia of skin: Secondary | ICD-10-CM

## 2021-11-07 DIAGNOSIS — I6523 Occlusion and stenosis of bilateral carotid arteries: Secondary | ICD-10-CM

## 2021-11-07 DIAGNOSIS — G459 Transient cerebral ischemic attack, unspecified: Secondary | ICD-10-CM | POA: Diagnosis not present

## 2021-11-07 DIAGNOSIS — I480 Paroxysmal atrial fibrillation: Secondary | ICD-10-CM | POA: Diagnosis not present

## 2021-11-14 DIAGNOSIS — H524 Presbyopia: Secondary | ICD-10-CM | POA: Diagnosis not present

## 2021-11-14 DIAGNOSIS — H40013 Open angle with borderline findings, low risk, bilateral: Secondary | ICD-10-CM | POA: Diagnosis not present

## 2021-11-28 ENCOUNTER — Telehealth: Payer: Self-pay | Admitting: Neurology

## 2021-11-28 ENCOUNTER — Telehealth: Payer: Self-pay | Admitting: *Deleted

## 2021-11-28 NOTE — Telephone Encounter (Signed)
I called patient. I asked him to call [facility] at [phone] and [employer] to discuss his adhesive strips for his heart monitor. Pt verbalized understanding.

## 2021-11-28 NOTE — Telephone Encounter (Signed)
Patient out of strips for his Cardiac event monitor.  He has request 2 more strips to last the duration on his enrollment.  [employer] will be contacted to ship patient 2 strips.

## 2021-11-28 NOTE — Telephone Encounter (Signed)
Pt would like a call from the nurse to discuss adhesive strips for the heart monitor. Do not have enough strips to last 30 days.

## 2021-12-06 ENCOUNTER — Ambulatory Visit: Payer: Medicare Other | Admitting: Vascular Surgery

## 2021-12-06 ENCOUNTER — Other Ambulatory Visit: Payer: Self-pay

## 2021-12-06 ENCOUNTER — Encounter: Payer: Self-pay | Admitting: Vascular Surgery

## 2021-12-06 VITALS — BP 136/69 | HR 60 | Temp 97.9°F | Resp 20 | Ht 69.0 in | Wt 206.0 lb

## 2021-12-06 DIAGNOSIS — I6521 Occlusion and stenosis of right carotid artery: Secondary | ICD-10-CM

## 2021-12-06 DIAGNOSIS — I6523 Occlusion and stenosis of bilateral carotid arteries: Secondary | ICD-10-CM | POA: Diagnosis not present

## 2021-12-06 NOTE — Progress Notes (Signed)
Office Note   HPI: Scott Jacobson is a 71 y.o. (Aug 19, 1951) male presenting in follow-up status post strokelike symptoms.   In short, the patient had an episode of left-sided facial numbness and weakness.  He immediately called his PCP who saw him in the office and prescribed dual antiplatelet therapy.  A carotid duplex ultrasound was ordered demonstrating bilateral carotid stenosis, and therefore the patient was referred to vascular surgery.  In the office I was concerned for stroke, and therefore he was referred to neurology with work-up including MRI and CT angio head and neck.    CT angiogram demonstrated 60% right ICA stenosis, 50% left ICA stenosis.  There was P2 posterior cerebral artery occlusion, subtle FLAIR and T2 parietal and occipital cortical hyperintensities possibly subacute infarcts more on the right than the left.   On exam today, Scott Jacobson was accompanied by his wife.  He continues to have motor difficulties with his left cheek, notably while smiling.  He also has some paresthesias in the left thigh.  He denied any new symptoms concerning for stroke, TIA, amaurosis.  Is currently wearing a cardiac event monitor to assess for paroxysmal atrial fibrillation.  The pt is  on a statin for cholesterol management.  The pt is  on a daily aspirin.   Other AC:  plavix The pt is on medication for hypertension.   The pt is not diabetic.  Tobacco hx:  former  Past Medical History:  Diagnosis Date   Anginal pain ([place])    Arthritis    "probably"   BPH (benign prostatic hyperplasia)    Carotid artery occlusion    Coronary artery disease    Dyspnea    GERD (gastroesophageal reflux disease)    History of kidney stones    Hypertension    Kidney stones    Macular degeneration    Myocardial infarction ([place])    Seasonal allergies     Past Surgical History:  Procedure Laterality Date   APPENDECTOMY     BACK SURGERY     CARDIAC CATHETERIZATION N/A 08/21/2016   Procedure: Left Heart  Cath and Coronary Angiography;  Surgeon: Burnell Blanks, MD;  Location: [facility];  Service: Cardiovascular;  Laterality: N/A;   CARDIAC CATHETERIZATION N/A 08/21/2016   Procedure: Coronary Stent Intervention;  Surgeon: Burnell Blanks, MD;  Location: [facility];  Service: Cardiovascular;  Laterality: N/A;   CARDIAC SURGERY     quadruple bypass   CATARACT EXTRACTION W/ INTRAOCULAR LENS  IMPLANT, BILATERAL Bilateral    COLONOSCOPY     COLONOSCOPY WITH PROPOFOL N/A 03/05/2021   Procedure: COLONOSCOPY WITH PROPOFOL;  Surgeon: Harvel Quale, MD;  [facility];  Service: Gastroenterology;  Laterality: N/A;  11:00   CORONARY ARTERY BYPASS GRAFT N/A 10/03/2016   Procedure: CORONARY ARTERY BYPASS GRAFTING (CABG)x 4 with endoscopic harvesting of right saphenous vein ,LIMA-LAD SVG-DIAG SVG-OM SVG-RCA;  Surgeon: Gaye Pollack, MD;  Location: [facility];  Service: Open Heart Surgery;  Laterality: N/A;   EYE SURGERY     GAS INSERTION Right 02/05/2016   Procedure: INSERTION OF GAS-C3F8;  Surgeon: Hayden Pedro, MD;  Location: [place];  Service: Ophthalmology;  Laterality: Right;   HAND SURGERY Right    thumb; S/P "cut his arm"   LASER PHOTO ABLATION Right 02/05/2016   Procedure: LASER PHOTO ABLATION-HEADSCOPE LASER;  Surgeon: Hayden Pedro, MD;  Location: [place];  Service: Ophthalmology;  Laterality: Right;   LUMBAR DISC SURGERY  ~  2002   POLYPECTOMY  03/05/2021   Procedure: POLYPECTOMY;  Surgeon: Montez Morita, Quillian Quince, MD;  [facility];  Service: Gastroenterology;;  ascending colon, sigmoid colon   RETINAL DETACHMENT REPAIR W/ SCLERAL BUCKLE LE Right 02/05/2016   SCLERAL BUCKLE Right 02/05/2016   Procedure: SCLERAL BUCKLE;  Surgeon: Hayden Pedro, MD;  Location: [place];  Service: Ophthalmology;  Laterality: Right;   TEE WITHOUT CARDIOVERSION N/A 10/03/2016   Procedure: TRANSESOPHAGEAL ECHOCARDIOGRAM (TEE);  Surgeon: Gaye Pollack, MD;  Location:  [place];  Service: Open Heart Surgery;  Laterality: N/A;   TONSILLECTOMY      Social History   Socioeconomic History   Marital status: Married    Spouse name: Not on file   Number of children: Not on file   Years of education: 12   Highest education level: Not on file  Occupational History   Not on file  Tobacco Use   Smoking status: Former    Packs/day: 3.00    Years: 16.00    Pack years: 48.00    Types: Cigarettes    Quit date: 02/04/1979    Years since quitting: 42.8   Smokeless tobacco: Never  Vaping Use   Vaping Use: Never used  Substance and Sexual Activity   Alcohol use: Yes    Comment: 02/06/2016 "a beer a couple times/month"   Drug use: No   Sexual activity: Not Currently  Other Topics Concern   Not on file  Social History Narrative   Lives at home with his wife.   Step-children.   Right-handed.   3 cups caffeine per day.   Social Determinants of Health   Financial Resource Strain: Not on file  Food Insecurity: Not on file  Transportation Needs: Not on file  Physical Activity: Not on file  Stress: Not on file  Social Connections: Not on file  Intimate Partner Violence: Not on file    Family History  Problem Relation Age of Onset   Hypertension Mother    Congestive Heart Failure Father     Current Outpatient Medications  Medication Sig Dispense Refill   atorvastatin (LIPITOR) 40 MG tablet TAKE 1 TABLET BY MOUTH EVERY DAY (Patient taking differently: Take 40 mg by mouth daily in the afternoon.) 90 tablet 2   clopidogrel (PLAVIX) 75 MG tablet Take 75 mg by mouth daily.     loratadine (CLARITIN) 10 MG tablet Take 10 mg by mouth every evening.     metoprolol tartrate (LOPRESSOR) 25 MG tablet Take 1 tablet (25 mg total) by mouth 2 (two) times daily. 60 tablet 11   Multiple Vitamins-Minerals (ICAPS MV PO) Take 1 tablet by mouth 2 (two) times daily.      pantoprazole (PROTONIX) 40 MG tablet Take 40 mg by mouth daily before breakfast.     Polyvinyl  Alcohol-Povidone (REFRESH OP) Place 1 drop into both eyes in the morning.     ramipril (ALTACE) 5 MG capsule Take 5 mg by mouth in the morning.     tamsulosin (FLOMAX) 0.4 MG CAPS capsule Take 0.4 mg by mouth in the morning.     No current facility-administered medications for this visit.    No Known Allergies   REVIEW OF SYSTEMS:   [X]  denotes positive finding, [ ]  denotes negative finding Cardiac  Comments:  Chest pain or chest pressure:    Shortness of breath upon exertion:    Short of breath when lying flat:    Irregular heart rhythm:  Vascular    Pain in calf, thigh, or hip brought on by ambulation:    Pain in feet at night that wakes you up from your sleep:     Blood clot in your veins:    Leg swelling:         Pulmonary    Oxygen at home:    Productive cough:     Wheezing:         Neurologic    Sudden weakness in arms or legs:     Sudden numbness in arms or legs:     Sudden onset of difficulty speaking or slurred speech:    Temporary loss of vision in one eye:     Problems with dizziness:         Gastrointestinal    Blood in stool:     Vomited blood:         Genitourinary    Burning when urinating:     Blood in urine:        Psychiatric    Major depression:         Hematologic    Bleeding problems:    Problems with blood clotting too easily:        Skin    Rashes or ulcers:        Constitutional    Fever or chills:      PHYSICAL EXAMINATION:  Vitals:   12/06/21 1131  BP: 136/69  Pulse: 60  Resp: 20  Temp: 97.9 F (36.6 C)  SpO2: 98%  Weight: 206 lb (93.4 kg)  Height: 5\' 9"  (1.753 m)    General:  WDWN in NAD; vital signs documented above Gait: Not observed HENT: WNL, normocephalic Pulmonary: normal non-labored breathing , without wheezing Cardiac: regular HR, Abdomen: soft, NT, no masses Skin: without rashes Vascular Exam/Pulses:  Right Left  Radial 2+ (normal) 2+ (normal)  Ulnar 2+ (normal) 2+ (normal)  Femoral     Popliteal    DP 2+ (normal) 2+ (normal)  PT 2+ (normal) 2+ (normal)   Extremities: without ischemic changes, without Gangrene , without cellulitis; without open wounds;  Musculoskeletal: no muscle wasting or atrophy  Neurologic: A&O X 3;  No focal weakness or paresthesias are detected Psychiatric:  The pt has Normal affect.   Non-Invasive Vascular Imaging:       ASSESSMENT/PLAN: MADOC HOLQUIN is a 71 y.o. male with history of left-sided facial numbness and weakness, left leg paresthesias.    CT angiogram demonstrated 60% right ICA stenosis, 50% left ICA stenosis.  There was P2 posterior cerebral artery occlusion, subtle FLAIR and T2 parietal and occipital cortical hyperintensities possibly subacute infarcts more on the right than the left.   The above findings confirm that Scott Jacobson has symptomatic right ICA stenosis.  He would benefit from cerebral revascularization.  I had a long discussion with Scott Jacobson and his wife regarding both carotid endarterectomy and transcarotid artery revascularization.  At his age, he has anatomy suitable for either modality.  After discussing the risk and benefits, Scott Jacobson elected to proceed with transcarotid artery revascularization.  I plan to reach out to Dr. Leonie Man to ensure he is okay with this plan moving forward, as well as discuss his loop recorder.  I asked Scott Jacobson to restart dual antiplatelet therapy as he will need to have both medications at steady state prior to stenting.  I plan to call him after discussing his case with Dr. Leonie Man regarding OR timing as well as loop recorder instructions.  Broadus Kaheem, MD [facility] [phone]

## 2021-12-10 ENCOUNTER — Ambulatory Visit (INDEPENDENT_AMBULATORY_CARE_PROVIDER_SITE_OTHER): Payer: Medicare Other | Admitting: Vascular Surgery

## 2021-12-10 ENCOUNTER — Other Ambulatory Visit: Payer: Self-pay

## 2021-12-10 DIAGNOSIS — I6523 Occlusion and stenosis of bilateral carotid arteries: Secondary | ICD-10-CM

## 2021-12-10 NOTE — Progress Notes (Deleted)
? ? ?  NAME: Scott Jacobson    MRN: [id number] ?DOB: 12-11-1950    DATE OF OPERATION: 12/10/2021 ? ?PREOP DIAGNOSIS:   ? ?End-stage renal disease ? ?POSTOP DIAGNOSIS:   ? ?Same ? ?PROCEDURE:  ?  ?*** ? ?SURGEON: Broadus Kyal ? ?ASSIST: *** ? ?ANESTHESIA: ***  ? ?EBL: *** ? ?INDICATIONS:  ? ? [name] is a 71 y.o. male *** ? ?FINDINGS:  ? ? *** ? ?TECHNIQUE:  ? ?*** ? ?***Given the complexity of the case a first assistant was necessary in order to expedient the procedure and safely perform the technical aspects of the operation. ? ?Macie Burows, MD ?[facility] ? ?DATE OF DICTATION:   12/10/2021  ?

## 2021-12-11 ENCOUNTER — Other Ambulatory Visit: Payer: Self-pay | Admitting: Neurology

## 2021-12-11 ENCOUNTER — Other Ambulatory Visit: Payer: Self-pay

## 2021-12-11 DIAGNOSIS — R200 Anesthesia of skin: Secondary | ICD-10-CM

## 2021-12-11 DIAGNOSIS — I6523 Occlusion and stenosis of bilateral carotid arteries: Secondary | ICD-10-CM

## 2021-12-11 DIAGNOSIS — G459 Transient cerebral ischemic attack, unspecified: Secondary | ICD-10-CM

## 2021-12-11 DIAGNOSIS — I480 Paroxysmal atrial fibrillation: Secondary | ICD-10-CM

## 2021-12-11 DIAGNOSIS — R202 Paresthesia of skin: Secondary | ICD-10-CM

## 2021-12-11 DIAGNOSIS — I6521 Occlusion and stenosis of right carotid artery: Secondary | ICD-10-CM

## 2021-12-11 NOTE — Progress Notes (Signed)
I called Scott Jacobson regarding his bilateral carotid artery stenosis, right greater than left. ?Since his last visit, I spoke to Dr. Leonie Man.  We both feel his right-sided carotid stenosis can be defined is symptomatic due to MRI findings.  I had a long conversation with Scott Jacobson and his wife regarding risks and benefits of transcarotid artery revascularization versus carotid endarterectomy.  After discussing these, Scott Jacobson elected to proceed with transcarotid artery revascularization. ? ?I will work to schedule him in the coming weeks.  He was asked to continue dual antiplatelet therapy and statin therapy. ? ?Broadus Bruce MD ? ?

## 2021-12-13 ENCOUNTER — Telehealth: Payer: Self-pay | Admitting: *Deleted

## 2021-12-13 NOTE — Telephone Encounter (Signed)
-----   Message from Burnell Blanks, MD sent at 12/12/2021  8:00 AM EST ----- ?Sinus on monitor ordered for CVA/TIA by Neurology. No atrial fib.  ?

## 2021-12-13 NOTE — Progress Notes (Signed)
Surgical Instructions ? ? ? Your procedure is scheduled on Thursday, March 16th, 2023. ? ? Report to [facility] Main Entrance "A" at 05:30 A.M., then check in with the Admitting office. ? Call this number if you have problems the morning of surgery: ? [phone] ? ? If you have any questions prior to your surgery date call [phone]: Open Monday-Friday 8am-4pm ? ? ? Remember: ? Do not eat or drink after midnight the night before your surgery ?  ? Take these medicines the morning of surgery with A SIP OF WATER:  ? ?atorvastatin (LIPITOR) ?metoprolol tartrate (LOPRESSOR) ?pantoprazole (PROTONIX) ?tamsulosin [facility])  ? ?Follow your surgeon's instructions on when to stop Aspirin and Plavix.  If no instructions were given by your surgeon then you will need to call the office to get those instructions.    ? ?As of today, STOP taking any Aspirin (unless otherwise instructed by your surgeon) Aleve, Naproxen, Ibuprofen, Motrin, Advil, Goody's, BC's, all herbal medications, fish oil, and all vitamins. ? ? ?The day of surgery: ?         ?Do not wear jewelry  ?Do not wear lotions, powders, colognes, or deodorant. ?Men may shave face and neck. ?Do not bring valuables to the hospital. ? ? ?[facility] is not responsible for any belongings or valuables. .  ? ?Do NOT Smoke (Tobacco/Vaping)  24 hours prior to your procedure ? ?If you use a CPAP at night, you may bring your mask for your overnight stay. ?  ?Contacts, glasses, hearing aids, dentures or partials may not be worn into surgery, please bring cases for these belongings ?  ?For patients admitted to the hospital, discharge time will be determined by your treatment team. ?  ?Patients discharged the day of surgery will not be allowed to drive home, and someone needs to stay with them for 24 hours. ? ?NO VISITORS WILL BE ALLOWED IN PRE-OP WHERE PATIENTS ARE PREPPED FOR SURGERY.  ONLY 1 SUPPORT PERSON MAY BE PRESENT IN THE WAITING ROOM WHILE YOU ARE IN SURGERY.  IF YOU ARE  TO BE ADMITTED, ONCE YOU ARE IN YOUR ROOM YOU WILL BE ALLOWED TWO (2) VISITORS. 1 (ONE) VISITOR MAY STAY OVERNIGHT BUT MUST ARRIVE TO THE ROOM BY 8pm.  Minor children may have two parents present. Special consideration for safety and communication needs will be reviewed on a case by case basis. ? ?Special instructions:   ? ?Oral Hygiene is also important to reduce your risk of infection.  Remember - BRUSH YOUR TEETH THE MORNING OF SURGERY WITH YOUR REGULAR TOOTHPASTE ? ? ?- Preparing For Surgery ? ?Before surgery, you can play an important role. Because skin is not sterile, your skin needs to be as free of germs as possible. You can reduce the number of germs on your skin by washing with CHG (chlorahexidine gluconate) Soap before surgery.  CHG is an antiseptic cleaner which kills germs and bonds with the skin to continue killing germs even after washing.   ? ? ?Please do not use if you have an allergy to CHG or antibacterial soaps. If your skin becomes reddened/irritated stop using the CHG.  ?Do not shave (including legs and underarms) for at least 48 hours prior to first CHG shower. It is OK to shave your face. ? ?Please follow these instructions carefully. ?  ? ? Shower the NIGHT BEFORE SURGERY and the MORNING OF SURGERY with CHG Soap.  ? If you chose to wash your hair, wash your hair  first as usual with your normal shampoo. After you shampoo, rinse your hair and body thoroughly to remove the shampoo.  Then [employer] and genitals (private parts) with your normal soap and rinse thoroughly to remove soap. ? ?After that Use CHG Soap as you would any other liquid soap. You can apply CHG directly to the skin and wash gently with a scrungie or a clean washcloth.  ? ?Apply the CHG Soap to your body ONLY FROM THE NECK DOWN.  Do not use on open wounds or open sores. Avoid contact with your eyes, ears, mouth and genitals (private parts). Wash Face and genitals (private parts)  with your normal soap.  ? ?Wash  thoroughly, paying special attention to the area where your surgery will be performed. ? ?Thoroughly rinse your body with warm water from the neck down. ? ?DO NOT shower/wash with your normal soap after using and rinsing off the CHG Soap. ? ?Pat yourself dry with a CLEAN TOWEL. ? ?Wear CLEAN PAJAMAS to bed the night before surgery ? ?Place CLEAN SHEETS on your bed the night before your surgery ? ?DO NOT SLEEP WITH PETS. ? ? ?Day of Surgery: ? ?Take a shower with CHG soap. ?Wear Clean/Comfortable clothing the morning of surgery ?Do not apply any deodorants/lotions.   ?Remember to brush your teeth WITH YOUR REGULAR TOOTHPASTE. ? ? ? ?COVID testing ? ?If you are going to stay overnight or be admitted after your procedure/surgery and require a pre-op COVID test, please follow these instructions after your COVID test  ? ?You are not required to quarantine however you are required to wear a well-fitting mask when you are out and around people not in your household.  If your mask becomes wet or soiled, replace with a new one. ? ?Wash your hands often with soap and water for 20 seconds or clean your hands with an alcohol-based hand sanitizer that contains at least 60% alcohol. ? ?Do not share personal items. ? ?Notify your provider: ?if you are in close contact with someone who has COVID  ?or if you develop a fever of 100.4 or greater, sneezing, cough, sore throat, shortness of breath or body aches. ? ?  ?Please read over the following fact sheets that you were given.   ?

## 2021-12-13 NOTE — Telephone Encounter (Signed)
The patient has been notified of the result and verbalized understanding.  All questions (if any) were answered.  He is having carotid surgery next week. ?Rodman Key, RN 12/13/2021 1:09 PM  ? ?

## 2021-12-16 ENCOUNTER — Other Ambulatory Visit: Payer: Self-pay

## 2021-12-16 ENCOUNTER — Encounter (HOSPITAL_COMMUNITY)
Admission: RE | Admit: 2021-12-16 | Discharge: 2021-12-16 | Disposition: A | Discharge status: Home / Self Care | Payer: Medicare Other | Source: Ambulatory Visit | Attending: Vascular Surgery | Admitting: Vascular Surgery

## 2021-12-16 ENCOUNTER — Encounter (HOSPITAL_COMMUNITY): Payer: Self-pay

## 2021-12-16 ENCOUNTER — Encounter: Payer: Self-pay | Admitting: *Deleted

## 2021-12-16 VITALS — BP 139/61 | HR 60 | Temp 97.7°F | Resp 17 | Ht 67.0 in | Wt 207.6 lb

## 2021-12-16 DIAGNOSIS — I252 Old myocardial infarction: Secondary | ICD-10-CM | POA: Insufficient documentation

## 2021-12-16 DIAGNOSIS — I5032 Chronic diastolic (congestive) heart failure: Secondary | ICD-10-CM | POA: Insufficient documentation

## 2021-12-16 DIAGNOSIS — I6521 Occlusion and stenosis of right carotid artery: Secondary | ICD-10-CM

## 2021-12-16 DIAGNOSIS — Z951 Presence of aortocoronary bypass graft: Secondary | ICD-10-CM | POA: Insufficient documentation

## 2021-12-16 DIAGNOSIS — Z01812 Encounter for preprocedural laboratory examination: Secondary | ICD-10-CM | POA: Insufficient documentation

## 2021-12-16 DIAGNOSIS — Z01818 Encounter for other preprocedural examination: Secondary | ICD-10-CM

## 2021-12-16 DIAGNOSIS — Z8673 Personal history of transient ischemic attack (TIA), and cerebral infarction without residual deficits: Secondary | ICD-10-CM | POA: Insufficient documentation

## 2021-12-16 DIAGNOSIS — I2510 Atherosclerotic heart disease of native coronary artery without angina pectoris: Secondary | ICD-10-CM | POA: Insufficient documentation

## 2021-12-16 DIAGNOSIS — E785 Hyperlipidemia, unspecified: Secondary | ICD-10-CM | POA: Insufficient documentation

## 2021-12-16 DIAGNOSIS — Z20822 Contact with and (suspected) exposure to covid-19: Secondary | ICD-10-CM | POA: Insufficient documentation

## 2021-12-16 DIAGNOSIS — I110 Hypertensive heart disease with heart failure: Secondary | ICD-10-CM | POA: Insufficient documentation

## 2021-12-16 LAB — URINALYSIS, ROUTINE W REFLEX MICROSCOPIC
Bilirubin Urine: NEGATIVE
Glucose, UA: NEGATIVE mg/dL
Ketones, ur: NEGATIVE mg/dL
Leukocytes,Ua: NEGATIVE
Nitrite: NEGATIVE
Protein, ur: NEGATIVE mg/dL
Specific Gravity, Urine: 1.013 (ref 1.005–1.030)
pH: 5 (ref 5.0–8.0)

## 2021-12-16 LAB — COMPREHENSIVE METABOLIC PANEL
ALT: 31 U/L (ref 0–44)
AST: 31 U/L (ref 15–41)
Albumin: 3.9 g/dL (ref 3.5–5.0)
Alkaline Phosphatase: 74 U/L (ref 38–126)
Anion gap: 6 (ref 5–15)
BUN: 13 mg/dL (ref 8–23)
CO2: 28 mmol/L (ref 22–32)
Calcium: 9 mg/dL (ref 8.9–10.3)
Chloride: 106 mmol/L (ref 98–111)
Creatinine, Ser: 0.8 mg/dL (ref 0.61–1.24)
GFR, Estimated: >60 mL/min (ref >60)
Glucose, Bld: 108 mg/dL — ABNORMAL HIGH (ref 70–99)
Potassium: 4.3 mmol/L (ref 3.5–5.1)
Sodium: 140 mmol/L (ref 135–145)
Total Bilirubin: 0.9 mg/dL (ref 0.3–1.2)
Total Protein: 6.7 g/dL (ref 6.5–8.1)

## 2021-12-16 LAB — CBC
HCT: 45.9 % (ref 39.0–52.0)
Hemoglobin: 15.2 g/dL (ref 13.0–17.0)
MCH: 30.9 pg (ref 26.0–34.0)
MCHC: 33.1 g/dL (ref 30.0–36.0)
MCV: 93.3 fL (ref 80.0–100.0)
Platelets: 175 10*3/uL (ref 150–400)
RBC: 4.92 MIL/uL (ref 4.22–5.81)
RDW: 13 % (ref 11.5–15.5)
WBC: 7 10*3/uL (ref 4.0–10.5)
nRBC: 0 % (ref 0.0–0.2)

## 2021-12-16 LAB — PROTIME-INR
INR: 1 (ref 0.8–1.2)
Prothrombin Time: 12.9 seconds (ref 11.4–15.2)

## 2021-12-16 LAB — TYPE AND SCREEN
ABO/RH(D): A NEG
Antibody Screen: NEGATIVE

## 2021-12-16 LAB — SURGICAL PCR SCREEN
MRSA, PCR: NEGATIVE
Staphylococcus aureus: NEGATIVE

## 2021-12-16 LAB — SARS CORONAVIRUS 2 BY RT PCR (HOSPITAL ORDER, PERFORMED IN ~~LOC~~ HOSPITAL LAB): SARS Coronavirus 2: NEGATIVE

## 2021-12-16 LAB — APTT: aPTT: 30 seconds (ref 24–36)

## 2021-12-16 NOTE — Progress Notes (Signed)
PCP - Hasanaj ?Cardiologist - [place] ? ?Chest x-ray - n/a ?EKG - 01/29/21 ?ECHO - 2017 ?Cardiac Cath - 08/21/2016 ? ?Blood Thinner Instructions: Follow your surgeon's instructions on when to stop Plavix & Aspirin ? ? ?COVID TEST- 12/16/21 (surgery admit) ? ? ?Anesthesia review: yes, heart history ? ?Patient denies shortness of breath, fever, cough and chest pain at PAT appointment ? ? ?All instructions explained to the patient, with a verbal understanding of the material. Patient agrees to go over the instructions while at home for a better understanding. Patient also instructed to self quarantine after being tested for COVID-19. The opportunity to ask questions was provided. ? ? ?

## 2021-12-17 NOTE — Progress Notes (Signed)
Anesthesia Chart Review: ? ?Follows with cardiology for history of hypertension, HLD, HFpEF, CAD status post inferior STEMI 08/2016 treated with BMS to the RCA also had severe disease in the left main and circumflex and underwent CABG x 4-12/29/17 with a LIMA to the LAD, SVG to diagonal, SVG to OM, SVG to PDA.  Last seen by Ermalinda Barrios, PA-C 01/29/2021.  At that time noted to be walking 4-5 miles every other day, no anginal symptoms.  No changes made to management.  Advised to follow-up in 1 year. ? ?Patient with recent CVA/TIA December 2022 felt secondary to carotid stenosis.  Seen by neurologist Dr. Leonie Man 10/31/2021.  Event monitor ordered to rule out A-fib.  Monitor showed sinus rhythm with PACs, no evidence of atrial fibrillation or flutter.  Patient was referred to vascular surgery by neurology for consideration of carotid intervention. ? ?Per vascular surgery, patient to stay on aspirin Plavix. ? ?Preop labs reviewed, unremarkable. ? ?EKG 01/29/2021: Sinus bradycardia.  Rate 53.  Nonspecific ST changes. ? ?Cardiac event monitor 12/11/2021: ?Sinus rhythm with premature ventricular contractions ?No evidence of atrial fibrillation or flutter ? ?CTA head neck 10/16/2021: ?IMPRESSION: ?1. Severe stenosis in the left P1 with complete occlusion of the ?left P2. Evaluation for any reconstitution of the distal PCA is ?limited by venous contamination; however, it is suspected that the ?remainder of the PCA is non-opacified. ?2. Small focus of hypodensity in the left posterior hippocampus. ?Given the time course of the patient's symptoms, this may represent ?a subacute infarct. ?3. 60% stenosis of the proximal right ICA, with 50% stenosis of the ?proximal right ECA. ? ?Carotid duplex 10/02/2021 (Care Everywhere): ?Right:  ?Heterogeneous and partially calcified plaque at the right carotid  ?bifurcation contributes to 70%-99% stenosis by established duplex  ?criteria.  ? ?Left:  ?Heterogeneous and partially calcified plaque at  the left carotid  ?bifurcation, with the measured velocity demonstrating less than 50%  ?narrowing. However, note that the flow velocities of the left ICA  ?were obtained from an area distal to the maximum narrowing due to  ?the presence of anterior wall plaque with shadowing and are favored  ?to be underestimating the percentage of ICA stenosis, given the  ?appearance of the parvus tardus waveform. If establishing a more  ?accurate degree of stenosis is required, cerebral angiogram should  ?be considered, or as a second best test, CTA.  ? ?Relatively unremarkable waveform of the bilateral vertebral  ?arteries, with no evidence of reversal.  ? ?TEE (postop CABG) 10/03/2016: ?? Tricuspid valve: Trace regurgitation. The tricuspid valve regurgitation  ?jet is central.  ?? Mitral valve: Dilated mitral annulus. Mild leaflet thickening is  ?present. Predominantly anterior mild mitral annular calcification. Mild  ?regurgitation.  ?? Aorta: The aortic root is mildly dilated at the sinus of Valsalva. The  ?ascending aorta is mildly dilated.  ?? Left ventricle: Normal cavity size and wall thickness. LV systolic  ?function is normal with an EF of 60-65%. There are no obvious wall motion  ?abnormalities.  ?? Aortic valve: The valve is trileaflet. Mild valve thickening present. No  ?stenosis. No regurgitation. No AV vegetation. No evidence of papillary  ?fibroelastoma.  ?? Right ventricle: Normal cavity size, wall thickness and ejection  ?fraction.  ?? Left ventricle: Normal cavity size.  ?  ? ? ? ?Karoline Caldwell, PA-C ?[facility] Short Stay Center/Anesthesiology ?Phone [phone] ?12/17/2021 12:04 PM ? ?

## 2021-12-17 NOTE — Anesthesia Preprocedure Evaluation (Addendum)
Anesthesia Evaluation  ?Patient identified by MRN, date of birth, ID band ?Patient awake ? ? ? ?Reviewed: ?Allergy & Precautions, NPO status , Patient's Chart, lab work & pertinent test results, reviewed documented beta blocker date and time  ? ?Airway ?Mallampati: II ? ?TM Distance: >3 FB ?Neck ROM: Full ? ? ? Dental ? ?(+) Dental Advisory Given, Edentulous Upper ?  ?Pulmonary ?former smoker,  ?  ?Pulmonary exam normal ?breath sounds clear to auscultation ? ? ? ? ? ? Cardiovascular ?hypertension, Pt. on home beta blockers and Pt. on medications ?+ angina + CAD, + Past MI, + Cardiac Stents (BMS to RCA), + CABG and + Peripheral Vascular Disease (right carotid artery stenosis)  ?Normal cardiovascular exam ?Rhythm:Regular Rate:Normal ? ? ?  ?Neuro/Psych ?TIACVA (Left face), Residual Symptoms negative psych ROS  ? GI/Hepatic ?Neg liver ROS, GERD  Medicated,  ?Endo/Other  ?Obesity ? ? Renal/GU ?negative Renal ROS  ? ?  ?Musculoskeletal ? ?(+) Arthritis ,  ? Abdominal ?  ?Peds ? Hematology ? ?(+) Blood dyscrasia (Plavix), ,   ?Anesthesia Other Findings ? ? Reproductive/Obstetrics ? ?  ? ? ? ? ? ? ? ? ? ? ? ? ? ?  ?  ? ? ? ? ? ? ?Anesthesia Physical ?Anesthesia Plan ? ?ASA: 3 ? ?Anesthesia Plan: General  ? ?Post-op Pain Management: Tylenol PO (pre-op)*  ? ?Induction: Intravenous ? ?PONV Risk Score and Plan: 2 and Dexamethasone and Ondansetron ? ?Airway Management Planned: Oral ETT ? ?Additional Equipment: Arterial line ? ?Intra-op Plan:  ? ?Post-operative Plan: Extubation in OR ? ?Informed Consent: I have reviewed the patients History and Physical, chart, labs and discussed the procedure including the risks, benefits and alternatives for the proposed anesthesia with the patient or authorized representative who has indicated his/her understanding and acceptance.  ? ? ? ?Dental advisory given ? ?Plan Discussed with: CRNA ? ?Anesthesia Plan Comments: (PAT note by Karoline Caldwell, PA-C: ?Follows  with cardiology for history of hypertension, HLD, HFpEF, CAD status post inferior STEMI 08/2016 treated with BMS to the RCA also had severe disease in the left main and circumflex and underwent CABG x 4-12/29/17 with a LIMA to the LAD, SVG to diagonal, SVG to OM, SVG to PDA.  Last seen by Ermalinda Barrios, PA-C 01/29/2021.  At that time noted to be walking 4-5 miles every other day, no anginal symptoms.  No changes made to management.  Advised to follow-up in 1 year. ? ?Patient with recent CVA/TIA December 2022 felt secondary to carotid stenosis.  Seen by neurologist Dr. Leonie Man 10/31/2021.  Event monitor ordered to rule out A-fib.  Monitor showed sinus rhythm with PACs, no evidence of atrial fibrillation or flutter.  Patient was referred to vascular surgery by neurology for consideration of carotid intervention. ? ?Per vascular surgery, patient to stay on aspirin Plavix. ? ?Preop labs reviewed, unremarkable. ? ?EKG 01/29/2021: Sinus bradycardia.  Rate 53.  Nonspecific ST changes. ? ?Cardiac event monitor 12/11/2021: ?Sinus rhythm with premature ventricular contractions ?No evidence of atrial fibrillation or flutter ? ?CTA head neck 10/16/2021: ?IMPRESSION: ?1. Severe stenosis in the left P1 with complete occlusion of the ?left P2. Evaluation for any reconstitution of the distal PCA is ?limited by venous contamination; however, it is suspected that the ?remainder of the PCA is non-opacified. ?2. Small focus of hypodensity in the left posterior hippocampus. ?Given the time course of the patient's symptoms, this may represent ?a subacute infarct. ?3. 60% stenosis of the proximal right ICA, with 50%  stenosis of the ?proximal right ECA. ? ?Carotid duplex 10/02/2021 (Care Everywhere): ?Right:  ?Heterogeneous and partially calcified plaque at the right carotid  ?bifurcation contributes to 70%-99% stenosis by established duplex  ?criteria.  ? ?Left:  ?Heterogeneous and partially calcified plaque at the left carotid  ?bifurcation,  with the measured velocity demonstrating less than 50%  ?narrowing. However, note that the flow velocities of the left ICA  ?were obtained from an area distal to the maximum narrowing due to  ?the presence of anterior wall plaque with shadowing and are favored  ?to be underestimating the percentage of ICA stenosis, given the  ?appearance of the parvus tardus waveform. If establishing a more  ?accurate degree of stenosis is required, cerebral angiogram should  ?be considered, or as a second best test, CTA.  ? ?Relatively?unremarkable waveform of the bilateral vertebral  ?arteries, with no evidence of reversal.  ? ?TEE (postop CABG) 10/03/2016: ?? Tricuspid valve: Trace regurgitation. The tricuspid valve regurgitation  ?jet is central.  ?? Mitral valve: Dilated mitral annulus. Mild leaflet thickening is  ?present. Predominantly anterior mild mitral annular calcification. Mild  ?regurgitation.  ?? Aorta: The aortic root is mildly dilated at the sinus of Valsalva. The  ?ascending aorta is mildly dilated.  ?? Left ventricle: Normal cavity size and wall thickness. LV systolic  ?function is normal with an EF of 60-65%. There are no obvious wall motion  ?abnormalities.  ?? Aortic valve: The valve is trileaflet. Mild valve thickening present. No  ?stenosis. No regurgitation. No AV vegetation. No evidence of papillary  ?fibroelastoma.  ?? Right ventricle: Normal cavity size, wall thickness and ejection  ?fraction.  ?? Left ventricle: Normal cavity size.  ?? ?)  ? ? ? ? ?Anesthesia Quick Evaluation ? ?

## 2021-12-19 ENCOUNTER — Inpatient Hospital Stay (HOSPITAL_COMMUNITY)
Admission: RE | Admit: 2021-12-19 | Discharge: 2021-12-20 | DRG: 036 | Disposition: A | Discharge status: Home / Self Care | Payer: Medicare Other | Attending: Vascular Surgery | Admitting: Vascular Surgery

## 2021-12-19 ENCOUNTER — Encounter (HOSPITAL_COMMUNITY)
Admission: RE | Disposition: A | Discharge status: Home / Self Care | Payer: Self-pay | Source: Home / Self Care | Attending: Vascular Surgery

## 2021-12-19 ENCOUNTER — Encounter (HOSPITAL_COMMUNITY): Payer: Self-pay | Admitting: Vascular Surgery

## 2021-12-19 ENCOUNTER — Other Ambulatory Visit: Payer: Self-pay

## 2021-12-19 ENCOUNTER — Inpatient Hospital Stay (HOSPITAL_COMMUNITY): Payer: Medicare Other

## 2021-12-19 ENCOUNTER — Inpatient Hospital Stay (HOSPITAL_COMMUNITY): Payer: Medicare Other | Admitting: Physician Assistant

## 2021-12-19 ENCOUNTER — Inpatient Hospital Stay (HOSPITAL_COMMUNITY): Payer: Medicare Other | Admitting: Certified Registered Nurse Anesthetist

## 2021-12-19 DIAGNOSIS — E785 Hyperlipidemia, unspecified: Secondary | ICD-10-CM | POA: Diagnosis present

## 2021-12-19 DIAGNOSIS — N400 Enlarged prostate without lower urinary tract symptoms: Secondary | ICD-10-CM | POA: Diagnosis present

## 2021-12-19 DIAGNOSIS — I25119 Atherosclerotic heart disease of native coronary artery with unspecified angina pectoris: Secondary | ICD-10-CM | POA: Diagnosis not present

## 2021-12-19 DIAGNOSIS — Z87891 Personal history of nicotine dependence: Secondary | ICD-10-CM

## 2021-12-19 DIAGNOSIS — Z006 Encounter for examination for normal comparison and control in clinical research program: Secondary | ICD-10-CM | POA: Diagnosis not present

## 2021-12-19 DIAGNOSIS — I252 Old myocardial infarction: Secondary | ICD-10-CM | POA: Diagnosis not present

## 2021-12-19 DIAGNOSIS — Z961 Presence of intraocular lens: Secondary | ICD-10-CM | POA: Diagnosis present

## 2021-12-19 DIAGNOSIS — I10 Essential (primary) hypertension: Secondary | ICD-10-CM

## 2021-12-19 DIAGNOSIS — K219 Gastro-esophageal reflux disease without esophagitis: Secondary | ICD-10-CM | POA: Diagnosis present

## 2021-12-19 DIAGNOSIS — Z8249 Family history of ischemic heart disease and other diseases of the circulatory system: Secondary | ICD-10-CM

## 2021-12-19 DIAGNOSIS — Z951 Presence of aortocoronary bypass graft: Secondary | ICD-10-CM

## 2021-12-19 DIAGNOSIS — Z8673 Personal history of transient ischemic attack (TIA), and cerebral infarction without residual deficits: Secondary | ICD-10-CM

## 2021-12-19 DIAGNOSIS — M1990 Unspecified osteoarthritis, unspecified site: Secondary | ICD-10-CM | POA: Diagnosis present

## 2021-12-19 DIAGNOSIS — Z955 Presence of coronary angioplasty implant and graft: Secondary | ICD-10-CM | POA: Diagnosis not present

## 2021-12-19 DIAGNOSIS — J302 Other seasonal allergic rhinitis: Secondary | ICD-10-CM | POA: Diagnosis present

## 2021-12-19 DIAGNOSIS — I6529 Occlusion and stenosis of unspecified carotid artery: Secondary | ICD-10-CM | POA: Diagnosis present

## 2021-12-19 DIAGNOSIS — I6521 Occlusion and stenosis of right carotid artery: Principal | ICD-10-CM | POA: Diagnosis present

## 2021-12-19 DIAGNOSIS — I63231 Cerebral infarction due to unspecified occlusion or stenosis of right carotid arteries: Secondary | ICD-10-CM | POA: Diagnosis not present

## 2021-12-19 DIAGNOSIS — Z20822 Contact with and (suspected) exposure to covid-19: Secondary | ICD-10-CM | POA: Diagnosis present

## 2021-12-19 DIAGNOSIS — I2510 Atherosclerotic heart disease of native coronary artery without angina pectoris: Secondary | ICD-10-CM | POA: Diagnosis present

## 2021-12-19 HISTORY — PX: TRANSCAROTID ARTERY REVASCULARIZATIONÂ: SHX6778

## 2021-12-19 LAB — CBC
HCT: 37.7 % — ABNORMAL LOW (ref 39.0–52.0)
Hemoglobin: 13.2 g/dL (ref 13.0–17.0)
MCH: 31.5 pg (ref 26.0–34.0)
MCHC: 35 g/dL (ref 30.0–36.0)
MCV: 90 fL (ref 80.0–100.0)
Platelets: 184 10*3/uL (ref 150–400)
RBC: 4.19 MIL/uL — ABNORMAL LOW (ref 4.22–5.81)
RDW: 12.9 % (ref 11.5–15.5)
WBC: 12 10*3/uL — ABNORMAL HIGH (ref 4.0–10.5)
nRBC: 0 % (ref 0.0–0.2)

## 2021-12-19 LAB — CREATININE, SERUM
Creatinine, Ser: 1.08 mg/dL (ref 0.61–1.24)
GFR, Estimated: >60 mL/min (ref >60)

## 2021-12-19 SURGERY — TRANSCAROTID ARTERY REVASCULARIZATION (TCAR)
Anesthesia: General | Site: Neck | Laterality: Right

## 2021-12-19 MED ORDER — GUAIFENESIN-DM 100-10 MG/5ML PO SYRP: 15.0000 mL | ORAL_SOLUTION | ORAL | Status: DC | PRN

## 2021-12-19 MED ORDER — CLOPIDOGREL BISULFATE 75 MG PO TABS
75.0000 mg | ORAL_TABLET | Freq: Every evening | ORAL | Status: DC
Start: 2021-12-19 — End: 2021-12-20
  Administered 2021-12-19: 75 mg via ORAL
  Filled 2021-12-19: qty 1

## 2021-12-19 MED ORDER — HEPARIN SODIUM (PORCINE) 5000 UNIT/ML IJ SOLN
5000.0000 [IU] | Freq: Three times a day (TID) | INTRAMUSCULAR | Status: DC
  Administered 2021-12-20: 5000 [IU] via SUBCUTANEOUS
  Filled 2021-12-19: qty 1

## 2021-12-19 MED ORDER — BISACODYL 5 MG PO TBEC: 5.0000 mg | DELAYED_RELEASE_TABLET | Freq: Every day | ORAL | Status: DC | PRN

## 2021-12-19 MED ORDER — CHLORHEXIDINE GLUCONATE CLOTH 2 % EX PADS
6.0000 | MEDICATED_PAD | Freq: Once | CUTANEOUS | Status: DC
  Administered 2021-12-19: 6 via TOPICAL

## 2021-12-19 MED ORDER — 0.9 % SODIUM CHLORIDE (POUR BTL) OPTIME
TOPICAL | Status: DC | PRN
  Administered 2021-12-19: 1000 mL

## 2021-12-19 MED ORDER — OXYCODONE-ACETAMINOPHEN 5-325 MG PO TABS: 1.0000 | ORAL_TABLET | ORAL | Status: DC | PRN

## 2021-12-19 MED ORDER — IODIXANOL 320 MG/ML IV SOLN
INTRAVENOUS | Status: DC | PRN
  Administered 2021-12-19: 10 mL

## 2021-12-19 MED ORDER — CEFAZOLIN SODIUM-DEXTROSE 2-4 GM/100ML-% IV SOLN
2.0000 g | Freq: Three times a day (TID) | INTRAVENOUS | Status: AC
  Administered 2021-12-19 (×2): 2 g via INTRAVENOUS
  Filled 2021-12-19 (×2): qty 100

## 2021-12-19 MED ORDER — MAGNESIUM SULFATE 2 GM/50ML IV SOLN: 2.0000 g | Freq: Every day | INTRAVENOUS | Status: DC | PRN

## 2021-12-19 MED ORDER — ONDANSETRON HCL 4 MG/2ML IJ SOLN: 4.0000 mg | Freq: Once | INTRAMUSCULAR | Status: DC | PRN

## 2021-12-19 MED ORDER — PROPOFOL 10 MG/ML IV BOLUS
INTRAVENOUS | Status: AC
  Filled 2021-12-19: qty 20

## 2021-12-19 MED ORDER — LORATADINE 10 MG PO TABS
10.0000 mg | ORAL_TABLET | Freq: Every evening | ORAL | Status: DC
  Administered 2021-12-19: 10 mg via ORAL
  Filled 2021-12-19: qty 1

## 2021-12-19 MED ORDER — HEPARIN 6000 UNIT IRRIGATION SOLUTION
Status: AC
  Filled 2021-12-19: qty 500

## 2021-12-19 MED ORDER — CHLORHEXIDINE GLUCONATE 0.12 % MT SOLN
OROMUCOSAL | Status: AC
  Administered 2021-12-19: 15 mL via OROMUCOSAL
  Filled 2021-12-19: qty 15

## 2021-12-19 MED ORDER — PROPOFOL 10 MG/ML IV BOLUS
INTRAVENOUS | Status: DC | PRN
Start: 2021-12-19 — End: 2021-12-19
  Administered 2021-12-19: 130 mg via INTRAVENOUS
  Administered 2021-12-19: 40 mg via INTRAVENOUS
  Administered 2021-12-19: 30 mg via INTRAVENOUS

## 2021-12-19 MED ORDER — LABETALOL HCL 5 MG/ML IV SOLN: 10.0000 mg | INTRAVENOUS | Status: DC | PRN

## 2021-12-19 MED ORDER — FENTANYL CITRATE (PF) 250 MCG/5ML IJ SOLN
INTRAMUSCULAR | Status: AC
  Filled 2021-12-19: qty 5

## 2021-12-19 MED ORDER — LIDOCAINE HCL (PF) 1 % IJ SOLN
INTRAMUSCULAR | Status: AC
  Filled 2021-12-19: qty 30

## 2021-12-19 MED ORDER — EPHEDRINE 5 MG/ML INJ
INTRAVENOUS | Status: AC
  Filled 2021-12-19: qty 5

## 2021-12-19 MED ORDER — ROCURONIUM BROMIDE 10 MG/ML (PF) SYRINGE
PREFILLED_SYRINGE | INTRAVENOUS | Status: DC | PRN
  Administered 2021-12-19: 60 mg via INTRAVENOUS
  Administered 2021-12-19: 20 mg via INTRAVENOUS

## 2021-12-19 MED ORDER — CEFAZOLIN SODIUM-DEXTROSE 2-4 GM/100ML-% IV SOLN
2.0000 g | INTRAVENOUS | Status: AC
  Administered 2021-12-19: 2 g via INTRAVENOUS
  Filled 2021-12-19: qty 100

## 2021-12-19 MED ORDER — PANTOPRAZOLE SODIUM 40 MG PO TBEC
40.0000 mg | DELAYED_RELEASE_TABLET | Freq: Every day | ORAL | Status: DC
  Filled 2021-12-19: qty 1

## 2021-12-19 MED ORDER — ACETAMINOPHEN 325 MG PO TABS: 325.0000 mg | ORAL_TABLET | ORAL | Status: DC | PRN

## 2021-12-19 MED ORDER — TAMSULOSIN HCL 0.4 MG PO CAPS
0.4000 mg | ORAL_CAPSULE | Freq: Every morning | ORAL | Status: DC
Start: 2021-12-20 — End: 2021-12-20
  Administered 2021-12-20: 0.4 mg via ORAL
  Filled 2021-12-19: qty 1

## 2021-12-19 MED ORDER — METOPROLOL TARTRATE 25 MG PO TABS
25.0000 mg | ORAL_TABLET | Freq: Two times a day (BID) | ORAL | Status: DC
  Administered 2021-12-19 – 2021-12-20 (×2): 25 mg via ORAL
  Filled 2021-12-19 (×2): qty 1

## 2021-12-19 MED ORDER — PROTAMINE SULFATE 10 MG/ML IV SOLN
INTRAVENOUS | Status: DC | PRN
  Administered 2021-12-19: 50 mg via INTRAVENOUS

## 2021-12-19 MED ORDER — LIDOCAINE 2% (20 MG/ML) 5 ML SYRINGE
INTRAMUSCULAR | Status: DC | PRN
  Administered 2021-12-19: 100 mg via INTRAVENOUS

## 2021-12-19 MED ORDER — ONDANSETRON HCL 4 MG/2ML IJ SOLN: 4.0000 mg | Freq: Four times a day (QID) | INTRAMUSCULAR | Status: DC | PRN

## 2021-12-19 MED ORDER — ORAL CARE MOUTH RINSE: 15.0000 mL | Freq: Once | OROMUCOSAL | Status: AC

## 2021-12-19 MED ORDER — SODIUM CHLORIDE 0.9 % IV SOLN: INTRAVENOUS | Status: DC

## 2021-12-19 MED ORDER — PHENYLEPHRINE 40 MCG/ML (10ML) SYRINGE FOR IV PUSH (FOR BLOOD PRESSURE SUPPORT)
PREFILLED_SYRINGE | INTRAVENOUS | Status: DC | PRN
  Administered 2021-12-19 (×3): 40 ug via INTRAVENOUS
  Administered 2021-12-19: 80 ug via INTRAVENOUS
  Administered 2021-12-19: 120 ug via INTRAVENOUS

## 2021-12-19 MED ORDER — PANTOPRAZOLE SODIUM 40 MG PO TBEC
40.0000 mg | DELAYED_RELEASE_TABLET | Freq: Every day | ORAL | Status: DC
  Administered 2021-12-20: 40 mg via ORAL
  Filled 2021-12-19: qty 1

## 2021-12-19 MED ORDER — DOCUSATE SODIUM 100 MG PO CAPS
100.0000 mg | ORAL_CAPSULE | Freq: Every day | ORAL | Status: DC
  Filled 2021-12-19: qty 1

## 2021-12-19 MED ORDER — CHLORHEXIDINE GLUCONATE CLOTH 2 % EX PADS
6.0000 | MEDICATED_PAD | Freq: Once | CUTANEOUS | Status: DC
Start: 2021-12-19 — End: 2021-12-19
  Administered 2021-12-19: 6 via TOPICAL

## 2021-12-19 MED ORDER — EPHEDRINE SULFATE-NACL 50-0.9 MG/10ML-% IV SOSY
PREFILLED_SYRINGE | INTRAVENOUS | Status: DC | PRN
  Administered 2021-12-19 (×3): 5 mg via INTRAVENOUS

## 2021-12-19 MED ORDER — ONDANSETRON HCL 4 MG/2ML IJ SOLN
INTRAMUSCULAR | Status: AC
  Filled 2021-12-19: qty 2

## 2021-12-19 MED ORDER — RAMIPRIL 5 MG PO CAPS
5.0000 mg | ORAL_CAPSULE | Freq: Every morning | ORAL | Status: DC
  Administered 2021-12-20: 5 mg via ORAL
  Filled 2021-12-19: qty 1

## 2021-12-19 MED ORDER — POTASSIUM CHLORIDE CRYS ER 20 MEQ PO TBCR: 20.0000 meq | EXTENDED_RELEASE_TABLET | Freq: Every day | ORAL | Status: DC | PRN

## 2021-12-19 MED ORDER — ACETAMINOPHEN 325 MG RE SUPP
325.0000 mg | RECTAL | Status: DC | PRN
  Filled 2021-12-19: qty 2

## 2021-12-19 MED ORDER — ACETAMINOPHEN 500 MG PO TABS
1000.0000 mg | ORAL_TABLET | Freq: Once | ORAL | Status: AC
Start: 2021-12-19 — End: 2021-12-19
  Administered 2021-12-19: 1000 mg via ORAL
  Filled 2021-12-19: qty 2

## 2021-12-19 MED ORDER — SENNOSIDES-DOCUSATE SODIUM 8.6-50 MG PO TABS: 1.0000 | ORAL_TABLET | Freq: Every evening | ORAL | Status: DC | PRN

## 2021-12-19 MED ORDER — HYDRALAZINE HCL 20 MG/ML IJ SOLN: 5.0000 mg | INTRAMUSCULAR | Status: DC | PRN

## 2021-12-19 MED ORDER — PHENYLEPHRINE 40 MCG/ML (10ML) SYRINGE FOR IV PUSH (FOR BLOOD PRESSURE SUPPORT)
PREFILLED_SYRINGE | INTRAVENOUS | Status: AC
  Filled 2021-12-19: qty 10

## 2021-12-19 MED ORDER — PHENOL 1.4 % MT LIQD: 1.0000 | OROMUCOSAL | Status: DC | PRN

## 2021-12-19 MED ORDER — FENTANYL CITRATE (PF) 100 MCG/2ML IJ SOLN: 25.0000 ug | INTRAMUSCULAR | Status: DC | PRN

## 2021-12-19 MED ORDER — SUGAMMADEX SODIUM 200 MG/2ML IV SOLN
INTRAVENOUS | Status: DC | PRN
Start: 2021-12-19 — End: 2021-12-19
  Administered 2021-12-19: 200 mg via INTRAVENOUS

## 2021-12-19 MED ORDER — DEXAMETHASONE SODIUM PHOSPHATE 10 MG/ML IJ SOLN
INTRAMUSCULAR | Status: DC | PRN
  Administered 2021-12-19: 10 mg via INTRAVENOUS

## 2021-12-19 MED ORDER — ASPIRIN EC 81 MG PO TBEC
81.0000 mg | DELAYED_RELEASE_TABLET | Freq: Every day | ORAL | Status: DC
  Administered 2021-12-20: 81 mg via ORAL
  Filled 2021-12-19: qty 1

## 2021-12-19 MED ORDER — HEMOSTATIC AGENTS (NO CHARGE) OPTIME
TOPICAL | Status: DC | PRN
  Administered 2021-12-19 (×3): 1 via TOPICAL

## 2021-12-19 MED ORDER — ONDANSETRON HCL 4 MG/2ML IJ SOLN
INTRAMUSCULAR | Status: DC | PRN
  Administered 2021-12-19: 4 mg via INTRAVENOUS

## 2021-12-19 MED ORDER — PHENYLEPHRINE HCL-NACL 20-0.9 MG/250ML-% IV SOLN
INTRAVENOUS | Status: DC | PRN
Start: 2021-12-19 — End: 2021-12-19
  Administered 2021-12-19: 25 ug/min via INTRAVENOUS

## 2021-12-19 MED ORDER — HEPARIN 6000 UNIT IRRIGATION SOLUTION
Status: DC | PRN
  Administered 2021-12-19: 1

## 2021-12-19 MED ORDER — METOPROLOL TARTRATE 5 MG/5ML IV SOLN: 2.0000 mg | INTRAVENOUS | Status: DC | PRN

## 2021-12-19 MED ORDER — LACTATED RINGERS IV SOLN: INTRAVENOUS | Status: DC

## 2021-12-19 MED ORDER — FENTANYL CITRATE (PF) 250 MCG/5ML IJ SOLN
INTRAMUSCULAR | Status: DC | PRN
  Administered 2021-12-19 (×4): 50 ug via INTRAVENOUS

## 2021-12-19 MED ORDER — GLYCOPYRROLATE 0.2 MG/ML IJ SOLN
INTRAMUSCULAR | Status: DC | PRN
  Administered 2021-12-19 (×2): .2 mg via INTRAVENOUS

## 2021-12-19 MED ORDER — ATORVASTATIN CALCIUM 40 MG PO TABS
40.0000 mg | ORAL_TABLET | Freq: Every day | ORAL | Status: DC
  Administered 2021-12-20: 40 mg via ORAL
  Filled 2021-12-19: qty 1

## 2021-12-19 MED ORDER — CHLORHEXIDINE GLUCONATE 0.12 % MT SOLN: 15.0000 mL | Freq: Once | OROMUCOSAL | Status: AC

## 2021-12-19 MED ORDER — SODIUM CHLORIDE 0.9 % IV SOLN: 500.0000 mL | Freq: Once | INTRAVENOUS | Status: DC | PRN

## 2021-12-19 MED ORDER — MORPHINE SULFATE (PF) 2 MG/ML IV SOLN: 2.0000 mg | INTRAVENOUS | Status: DC | PRN

## 2021-12-19 MED ORDER — ALUM & MAG HYDROXIDE-SIMETH 200-200-20 MG/5ML PO SUSP: 15.0000 mL | ORAL | Status: DC | PRN

## 2021-12-19 SURGICAL SUPPLY — 48 items
BAG BANDED W/RUBBER/TAPE 36X54 (MISCELLANEOUS) ×2 IMPLANT
BAG COUNTER SPONGE SURGICOUNT (BAG) ×2 IMPLANT
BALLN STERLING RX 5X30X80 (BALLOONS) ×2
BALLOON STERLING RX 5X30X80 (BALLOONS) IMPLANT
CANISTER SUCT 3000ML PPV (MISCELLANEOUS) ×2 IMPLANT
CATH ROBINSON RED A/P 18FR (CATHETERS) ×1 IMPLANT
CLIP LIGATING EXTRA MED SLVR (CLIP) ×2 IMPLANT
CLIP LIGATING EXTRA SM BLUE (MISCELLANEOUS) ×2 IMPLANT
COVER DOME SNAP 22 D (MISCELLANEOUS) ×2 IMPLANT
COVER PROBE W GEL 5X96 (DRAPES) ×2 IMPLANT
DERMABOND ADVANCED (GAUZE/BANDAGES/DRESSINGS) ×2
DERMABOND ADVANCED .7 DNX12 (GAUZE/BANDAGES/DRESSINGS) ×1 IMPLANT
DRAPE FEMORAL ANGIO 80X135IN (DRAPES) ×2 IMPLANT
ELECT REM PT RETURN 9FT ADLT (ELECTROSURGICAL) ×2
ELECTRODE REM PT RTRN 9FT ADLT (ELECTROSURGICAL) ×1 IMPLANT
GLOVE SRG 8 PF TXTR STRL LF DI (GLOVE) ×1 IMPLANT
GLOVE SURG POLYISO LF SZ8 (GLOVE) IMPLANT
GLOVE SURG UNDER POLY LF SZ8 (GLOVE) ×2
GOWN STRL REUS W/ TWL LRG LVL3 (GOWN DISPOSABLE) ×2 IMPLANT
GOWN STRL REUS W/TWL 2XL LVL3 (GOWN DISPOSABLE) ×2 IMPLANT
GOWN STRL REUS W/TWL LRG LVL3 (GOWN DISPOSABLE) ×4
GUIDEWIRE ENROUTE 0.014 (WIRE) ×2 IMPLANT
HEMOSTAT SNOW SURGICEL 2X4 (HEMOSTASIS) ×3 IMPLANT
INTRODUCER KIT GALT 7CM (INTRODUCER) ×2
KIT BASIN OR (CUSTOM PROCEDURE TRAY) ×2 IMPLANT
KIT ENCORE 26 ADVANTAGE (KITS) ×2 IMPLANT
KIT INTRODUCER GALT 7 (INTRODUCER) ×1 IMPLANT
KIT TURNOVER KIT B (KITS) ×2 IMPLANT
PACK CAROTID (CUSTOM PROCEDURE TRAY) ×2 IMPLANT
POSITIONER HEAD DONUT 9IN (MISCELLANEOUS) ×2 IMPLANT
PROTECTION STATION PRESSURIZED (MISCELLANEOUS) ×2
SET MICROPUNCTURE 5F STIFF (MISCELLANEOUS) ×2 IMPLANT
STATION PROTECTION PRESSURIZED (MISCELLANEOUS) ×1 IMPLANT
STENT TRANSCAROTID SYSTEM 9X30 (Permanent Stent) ×1 IMPLANT
SUT MNCRL AB 4-0 PS2 18 (SUTURE) ×2 IMPLANT
SUT PROLENE 5 0 C 1 24 (SUTURE) ×2 IMPLANT
SUT SILK 2 0 PERMA HAND 18 BK (SUTURE) ×1 IMPLANT
SUT SILK 2 0 SH (SUTURE) ×1 IMPLANT
SUT VIC AB 3-0 SH 27 (SUTURE) ×1
SUT VIC AB 3-0 SH 27X BRD (SUTURE) ×1 IMPLANT
SYR 10ML LL (SYRINGE) ×7 IMPLANT
SYR 20ML LL LF (SYRINGE) ×2 IMPLANT
SYR CONTROL 10ML LL (SYRINGE) IMPLANT
SYSTEM TRANSCAROTID NEUROPRTCT (MISCELLANEOUS) ×1 IMPLANT
TOWEL GREEN STERILE (TOWEL DISPOSABLE) ×2 IMPLANT
TRANSCAROTID NEUROPROTECT SYS (MISCELLANEOUS) ×2
WATER STERILE IRR 1000ML POUR (IV SOLUTION) ×2 IMPLANT
WIRE BENTSON .035X145CM (WIRE) ×2 IMPLANT

## 2021-12-19 NOTE — Progress Notes (Signed)
Patient arrived to 4E from PACU. Telemetry box applied, CCMD notified. VSS. Patient oriented to room and staff. Call bell in reach. ? ?Daymon Larsen, RN  ?

## 2021-12-19 NOTE — H&P (Signed)
?Patient seen and examined in preop holding.  No complaints. ?No changes to medication history or physical exam since last seen in clinic. I called [facility] regarding his bilateral carotid artery stenosis, right greater than left last week. ?Since his last visit, I spoke to Dr. Leonie Man.  We both feel his right-sided carotid stenosis can be defined is symptomatic due to MRI findings.  I had a long conversation with Dontavion and his wife regarding risks and benefits of transcarotid artery revascularization versus carotid endarterectomy.  After discussing these, [place] elected to proceed with transcarotid artery revascularization. ?He has been compliant with his aspirin and Plavix. ? ? ?Broadus Ranbir MD ? ? ? ? ?Office Note  ? ?HPI: BRYNN REZNIK is a 71 y.o. (1951/09/18) male presenting in follow-up status post strokelike symptoms. ? ? ?In short, the patient had an episode of left-sided facial numbness and weakness.  He immediately called his PCP who saw him in the office and prescribed dual antiplatelet therapy.  A carotid duplex ultrasound was ordered demonstrating bilateral carotid stenosis, and therefore the patient was referred to vascular surgery.  In the office I was concerned for stroke, and therefore he was referred to neurology with work-up including MRI and CT angio head and neck.   ? ?CT angiogram demonstrated 60% right ICA stenosis, 50% left ICA stenosis.  There was P2 posterior cerebral artery occlusion, subtle FLAIR and T2 parietal and occipital cortical hyperintensities possibly subacute infarcts more on the right than the left.  ? ?On exam today, Brandon was accompanied by his wife.  He continues to have motor difficulties with his left cheek, notably while smiling.  He also has some paresthesias in the left thigh.  He denied any new symptoms concerning for stroke, TIA, amaurosis. ? ?Is currently wearing a cardiac event monitor to assess for paroxysmal atrial fibrillation. ? ?The pt is  on a statin for cholesterol  management.  ?The pt is  on a daily aspirin.   Other AC:  plavix ?The pt is on medication for hypertension.   ?The pt is not diabetic.  ?Tobacco hx:  former ? ?Past Medical History:  ?Diagnosis Date  ? Anginal pain ([place])   ? Arthritis   ? "probably"  ? BPH (benign prostatic hyperplasia)   ? Carotid artery occlusion   ? Coronary artery disease   ? Dyspnea   ? GERD (gastroesophageal reflux disease)   ? History of kidney stones   ? Hypertension   ? Kidney stones   ? Macular degeneration   ? Myocardial infarction [facility])   ? Seasonal allergies   ? ? ?Past Surgical History:  ?Procedure Laterality Date  ? APPENDECTOMY    ? BACK SURGERY    ? CARDIAC CATHETERIZATION N/A 08/21/2016  ? Procedure: Left Heart Cath and Coronary Angiography;  Surgeon: Burnell Blanks, MD;  Location: [facility];  Service: Cardiovascular;  Laterality: N/A;  ? CARDIAC CATHETERIZATION N/A 08/21/2016  ? Procedure: Coronary Stent Intervention;  Surgeon: Burnell Blanks, MD;  Location: [facility];  Service: Cardiovascular;  Laterality: N/A;  ? CARDIAC SURGERY    ? quadruple bypass  ? CATARACT EXTRACTION W/ INTRAOCULAR LENS  IMPLANT, BILATERAL Bilateral   ? COLONOSCOPY    ? COLONOSCOPY WITH PROPOFOL N/A 03/05/2021  ? Procedure: COLONOSCOPY WITH PROPOFOL;  Surgeon: Harvel Quale, MD;  [facility];  Service: Gastroenterology;  Laterality: N/A;  11:00  ? CORONARY ARTERY BYPASS GRAFT N/A 10/03/2016  ? Procedure: CORONARY  ARTERY BYPASS GRAFTING (CABG)x 4 with endoscopic harvesting of right saphenous vein ,LIMA-LAD SVG-DIAG SVG-OM SVG-RCA;  Surgeon: Gaye Pollack, MD;  Location: [facility];  Service: Open Heart Surgery;  Laterality: N/A;  ? EYE SURGERY    ? GAS INSERTION Right 02/05/2016  ? Procedure: INSERTION OF [id number];  Surgeon: Hayden Pedro, MD;  Location: [facility];  Service: Ophthalmology;  Laterality: Right;  ? HAND SURGERY Right   ? thumb; S/P "cut his arm"  ? LASER PHOTO ABLATION Right 02/05/2016  ?  Procedure: LASER PHOTO ABLATION-HEADSCOPE LASER;  Surgeon: Hayden Pedro, MD;  Location: [facility];  Service: Ophthalmology;  Laterality: Right;  ? LUMBAR DISC SURGERY  ~ 2002  ? POLYPECTOMY  03/05/2021  ? Procedure: POLYPECTOMY;  Surgeon: Montez Morita, Quillian Quince, MD;  [facility];  Service: Gastroenterology;;  ascending colon, sigmoid colon  ? RETINAL DETACHMENT REPAIR W/ SCLERAL BUCKLE LE Right 02/05/2016  ? SCLERAL BUCKLE Right 02/05/2016  ? Procedure: SCLERAL BUCKLE;  Surgeon: Hayden Pedro, MD;  Location: [facility];  Service: Ophthalmology;  Laterality: Right;  ? TEE WITHOUT CARDIOVERSION N/A 10/03/2016  ? Procedure: TRANSESOPHAGEAL ECHOCARDIOGRAM (TEE);  Surgeon: Gaye Pollack, MD;  Location: [facility];  Service: Open Heart Surgery;  Laterality: N/A;  ? TONSILLECTOMY    ? ? ?Social History  ? ?Socioeconomic History  ? Marital status: Married  ?  Spouse name: Not on file  ? Number of children: Not on file  ? Years of education: 79  ? Highest education level: Not on file  ?Occupational History  ? Not on file  ?Tobacco Use  ? Smoking status: Former  ?  Packs/day: 3.00  ?  Years: 16.00  ?  Pack years: 48.00  ?  Types: Cigarettes  ?  Quit date: 02/04/1979  ?  Years since quitting: 42.9  ? Smokeless tobacco: Never  ?Vaping Use  ? Vaping Use: Never used  ?Substance and Sexual Activity  ? Alcohol use: Yes  ?  Comment: occassionally  ? Drug use: No  ? Sexual activity: Not Currently  ?Other Topics Concern  ? Not on file  ?Social History Narrative  ? Lives at home with his wife.  ? Step-children.  ? Right-handed.  ? 3 cups caffeine per day.  ? ?Social Determinants of Health  ? ?Financial Resource Strain: Not on file  ?Food Insecurity: Not on file  ?Transportation Needs: Not on file  ?Physical Activity: Not on file  ?Stress: Not on file  ?Social Connections: Not on file  ?Intimate Partner Violence: Not on file  ? ? ?Family History  ?Problem Relation Age of Onset  ? Hypertension Mother   ? Congestive Heart Failure Father    ? ? ?Current Facility-Administered Medications  ?Medication Dose Route Frequency Provider Last Rate Last Admin  ? 0.9 %  sodium chloride infusion   Intravenous Continuous Broadus Shakir, MD      ? ceFAZolin (ANCEF) IVPB 2g/100 mL premix  2 g Intravenous 30 min Pre-Op Broadus Nathaneil, MD      ? Chlorhexidine Gluconate Cloth 2 % PADS 6 each  6 each Topical Once Broadus Shantel, MD      ? And  ? Chlorhexidine Gluconate Cloth 2 % PADS 6 each  6 each Topical Once Broadus Tecumseh, MD      ? lactated ringers infusion   Intravenous Continuous Santa Lighter, MD      ? ? ?No Known Allergies ? ? ?REVIEW OF SYSTEMS:  ? ?[  X] denotes positive finding, '[ ]'$  denotes negative finding ?Cardiac  Comments:  ?Chest pain or chest pressure:    ?Shortness of breath upon exertion:    ?Short of breath when lying flat:    ?Irregular heart rhythm:    ?    ?Vascular    ?Pain in calf, thigh, or hip brought on by ambulation:    ?Pain in feet at night that wakes you up from your sleep:     ?Blood clot in your veins:    ?Leg swelling:     ?    ?Pulmonary    ?Oxygen at home:    ?Productive cough:     ?Wheezing:     ?    ?Neurologic    ?Sudden weakness in arms or legs:     ?Sudden numbness in arms or legs:     ?Sudden onset of difficulty speaking or slurred speech:    ?Temporary loss of vision in one eye:     ?Problems with dizziness:     ?    ?Gastrointestinal    ?Blood in stool:     ?Vomited blood:     ?    ?Genitourinary    ?Burning when urinating:     ?Blood in urine:    ?    ?Psychiatric    ?Major depression:     ?    ?Hematologic    ?Bleeding problems:    ?Problems with blood clotting too easily:    ?    ?Skin    ?Rashes or ulcers:    ?    ?Constitutional    ?Fever or chills:    ? ? ?PHYSICAL EXAMINATION: ? ?Vitals:  ? 12/19/21 0544  ?BP: (!) 164/72  ?Pulse: 60  ?Resp: 17  ?Temp: (!) 97.5 ?F (36.4 ?C)  ?TempSrc: Oral  ?SpO2: 96%  ?Weight: 92.5 kg  ?Height: '5\' 7"'$  (1.702 m)  ? ? ?General:  WDWN in NAD; vital signs documented above ?Gait:  Not observed ?HENT: WNL, normocephalic ?Pulmonary: normal non-labored breathing , without wheezing ?Cardiac: regular HR, ?Abdomen: soft, NT, no masses ?Skin: without rashes ?Vascular Exam/Pulses: ? Right Left

## 2021-12-19 NOTE — Progress Notes (Signed)
[facility] [place] ? ?Subjective  - No new complaints.  Incision a little sore, tolerates lunch without swallowing difficulties. ? ? ?Objective ?(!) 137/57 ?77 ?97.9 ?F (36.6 ?C) (Oral) ?15 ?94% ? ?Intake/Output Summary (Last 24 hours) at 12/19/2021 1552 ?Last data filed at 12/19/2021 1527 ?Gross per 24 hour  ?Intake 2040 ml  ?Output 70 ml  ?Net 1970 ml  ? ? ?Left carotid incision with ecchymosis.  No frank hematoma noted, soft. ?Palpable radial pulses, grip 5/5 ?Left groin soft with ecchymosis, no active bleeding at venous stick site. ?No tongue deviation or facial droop.  ? ?Assessment/Planning: ?S/p Right TCAR ? ?General no acute distress ?Stable post op disposition. ? ?Roxy Horseman ?12/19/2021 ?3:52 PM ?-- ? ?Laboratory ?Lab Results: ?No results for input(s): WBC, HGB, HCT, PLT in the last 72 hours. ?BMET ?No results for input(s): NA, K, CL, CO2, GLUCOSE, BUN, CREATININE, CALCIUM in the last 72 hours. ? ?COAG ?Lab Results  ?Component Value Date  ? INR 1.0 12/16/2021  ? INR 1.33 10/03/2016  ? INR 0.99 10/01/2016  ? ?No results found for: PTT ? ? ? ?

## 2021-12-19 NOTE — Anesthesia Procedure Notes (Signed)
Arterial Line Insertion ?Start/End3/16/2023 7:00 AM, 12/19/2021 7:05 AM ?Performed by: Griffin Dakin, CRNA, CRNA ? Patient location: Pre-op. ?Preanesthetic checklist: patient identified, IV checked, site marked, risks and benefits discussed, surgical consent, monitors and equipment checked, pre-op evaluation, timeout performed and anesthesia consent ?Lidocaine 1% used for infiltration ?Left, radial was placed ?Catheter size: 20 G ?Hand hygiene performed  and maximum sterile barriers used  ?Allen's test indicative of satisfactory collateral circulation ?Attempts: 1 ?Procedure performed without using ultrasound guided technique. ?Following insertion, dressing applied and Biopatch. ?Post procedure assessment: normal ? ?Patient tolerated the procedure well with no immediate complications. ? ? ?

## 2021-12-19 NOTE — Anesthesia Procedure Notes (Signed)
Procedure Name: Intubation ?Date/Time: 12/19/2021 7:46 AM ?Performed by: Inda Coke, CRNA ?Pre-anesthesia Checklist: Patient identified, Emergency Drugs available, Suction available and Patient being monitored ?Patient Re-evaluated:Patient Re-evaluated prior to induction ?Oxygen Delivery Method: Circle System Utilized ?Preoxygenation: Pre-oxygenation with 100% oxygen ?Induction Type: IV induction ?Ventilation: Mask ventilation without difficulty ?Laryngoscope Size: Mac and 4 ?Grade View: Grade I ?Tube type: Oral ?Tube size: 7.5 mm ?Number of attempts: 1 ?Airway Equipment and Method: Stylet and Oral airway ?Placement Confirmation: ETT inserted through vocal cords under direct vision, positive ETCO2 and breath sounds checked- equal and bilateral ?Secured at: 22 cm ?Tube secured with: Tape ?Dental Injury: Teeth and Oropharynx as per pre-operative assessment  ?Comments: Smooth intubation per paramedic student ? ? ? ? ?

## 2021-12-19 NOTE — Transfer of Care (Signed)
Immediate Anesthesia Transfer of Care Note ? ?Patient: Scott Jacobson ? ?Procedure(s) Performed: Right Transcarotid Artery Revascularization (Right: Neck) ? ?Patient Location: PACU ? ?Anesthesia Type:General ? ?Level of Consciousness: awake, alert  and oriented ? ?Airway & Oxygen Therapy: Patient Spontanous Breathing and Patient connected to nasal cannula oxygen ? ?Post-op Assessment: Report given to RN and Post -op Vital signs reviewed and stable ? ?Post vital signs: Reviewed and stable ? ?Last Vitals:  ?Vitals Value Taken Time  ?BP 122/58 12/19/21 0937  ?Temp    ?Pulse 77 12/19/21 0938  ?Resp 14 12/19/21 0938  ?SpO2 96 % 12/19/21 0938  ?Vitals shown include unvalidated device data. ? ?Last Pain:  ?Vitals:  ? 12/19/21 0605  ?TempSrc:   ?PainSc: 0-No pain  ?   ? ?Patients Stated Pain Goal: 0 (12/19/21 1886) ? ?Complications: No notable events documented. ?

## 2021-12-19 NOTE — TOC Initial Note (Signed)
Transition of Care (TOC) - Initial/Assessment Note  ? ? ?Patient Details  ?Name: Scott Jacobson ?MRN: [id number] ?Date of Birth: [birth year] ? ?Transition of Care (TOC) CM/SW Contact:    ?Verdell Carmine, RN ?Phone Number: ?12/19/2021, 3:15 PM ? ?Clinical Narrative:                 ?The Transition of Care Department [facility]) has reviewed patient and no TOC needs have been identified at this time. We will continue to monitor patient advancement through interdisciplinary progression rounds. If new patient transition needs arise, please place a TOC consult ? ? ?  ?  ? ? ?Patient Goals and CMS Choice ?  ?  ?  ? ?Expected Discharge Plan and Services ?  ?  ?  ?  ?  ?                ?  ?  ?  ?  ?  ?  ?  ?  ?  ?  ? ?Prior Living Arrangements/Services ?  ?  ?  ?       ?  ?  ?  ?  ? ?Activities of Daily Living ?  ?  ? ?Permission Sought/Granted ?  ?  ?   ?   ?   ?   ? ?Emotional Assessment ?  ?  ?  ?  ?  ?  ? ?Admission diagnosis:  Carotid stenosis [I65.29] ?Patient Active Problem List  ? Diagnosis Date Noted  ? Carotid stenosis 12/19/2021  ? S/P CABG x 4 10/03/2016  ? Coronary artery disease involving native heart without angina pectoris 08/23/2016  ? HLD (hyperlipidemia) 08/23/2016  ? Acute ST elevation myocardial infarction (STEMI) of inferior wall ([place]) 08/21/2016  ? Acute ST elevation myocardial infarction (STEMI) involving right coronary artery ([place])   ? Nausea and vomiting 02/06/2016  ? Macular degeneration   ? BPH (benign prostatic hyperplasia)   ? Rhegmatogenous retinal detachment of right eye 02/05/2016  ? ?PCP:  Neale Burly, MD ?Pharmacy:   ?[facility] ?[place] [address] ?Phone: [phone] Fax: [phone] ? ? ? ? ?Social Determinants of Health (SDOH) Interventions ?  ? ?Readmission Risk Interventions ?No flowsheet data found. ? ? ?

## 2021-12-19 NOTE — Op Note (Signed)
? ? ?NAME: Scott Jacobson    MRN: [id number] ?DOB: [birth year]    DATE OF OPERATION: 12/19/2021 ? ?PREOP DIAGNOSIS:   ? ?Symptomatic right ICA stenosis ? ?POSTOP DIAGNOSIS:   ? ?Same ? ?PROCEDURE:  ?  ?Right-sided transcarotid artery stenting-9 x 30 mm stent ? ?SURGEON: Broadus Javed ? ?ASSIST: Roxy Horseman, PA ? ?ANESTHESIA: General ? ?EBL: 25 mL ? ?INDICATIONS:  ? ? Scott Jacobson is a 71 y.o. male who presented to my office with strokelike symptoms and bilateral ICA stenosis, right greater than left.  Neurology work-up demonstrated right-sided stroke.  I spoke with his neurologist, Dr. Leonie Jacobson who felt the lesion was an embolic event from the right carotid artery stenosis.  After discussing the risk and benefits of transcarotid artery revascularization for symptomatic internal carotid artery stenosis, [place] elected to proceed. ? ?FINDINGS:  ? ? 80% Right ICA stenosis ? ?TECHNIQUE:  ? ?The patient was brought to the operating room, where support lines were placed and general anesthesia was secured. The right neck and left groin were prepped and the patient was sterilely draped. A transverse 2-4 cm incision was made between the sternal and clavicular heads of the sternocleidomastoid muscle, below the omohyoid. Following longitudinal division of the carotid sheath the jugular vein was partially dissected and retracted medially. Once 3 cm of common carotid artery (CCA) were isolated, umbilical tape was placed around the proximal 1/3 of the CCA under direct vision. A 5.0 polypropylene suture was pre-placed in the anterior wall of the CCA, in a ?U stitch? configuration, close to the clavicle to facilitate hemostasis upon removal of the arterial sheath at completion of the TCAR procedure. ? ?The contralateral (left) common femoral vein (CFV) was accessed under ultrasound guidance, using standard Seldinger and micropuncture access technique. Permanent recorded image(s) was/were saved in the patient's medical record. The  Venous Return Sheath was advanced into the CFV over the 0.035? wire provided. Blood was aspirated from the flow line followed by flushing of the Venous Sheath with heparinized saline. The Venous Sheath was secured to the patient?s skin with suture to maintain optimal position in the vessel. ? ?Heparin was given to obtain a therapeutic activated clotting time >250 seconds prior to arterial access. A 4-French non-stiffened ENHANCE? Transcarotid / Peripheral Access set was used, puncturing the artery with the 21G needle through the pre-placed ?U? stitch while holding gentle traction on the umbilical tape to stabilize and centralize the CCA within the incision. Careful attention was paid to the change in CCA shape when using the umbilical tape to control or lift the artery. The micropuncture wire was then advanced 3-4 cm into the CCA and, the 21G needle was removed. The micropuncture sheath was advanced 2-3 cm into the CCA and the wire and dilator were removed. Pulsatile backflow indicated correct positioning. The provided 0.035" J-tipped guidewire was inserted as close as possible to the bifurcation without engaging the lesion. After micropuncture sheath removal, the Transcarotid Arterial Sheath was advanced to the 2.5cm marker and the 0.035? wire and dilator were then removed. Arterial Sheath position was assessed under fluoroscopy in two projections to ensure that the sheath tip was oriented coaxially in the CCA. The Arterial Sheath was sutured to the patient with gentle forward tension. Blood was slowly aspirated followed by flushing with heparinized saline. No ingress of air bubbles through the passive hemostatic valve was observed. The stopcocks were closed. Traction applied to the CCA previously to facilitate access was gently released. ? ?The  Flow Controller was connected to the Transcarotid Arterial Sheath, prepared by passively allowing a column of arterial blood to fill the line and connected to the Venous  Return Sheath. CCA inflow was occluded proximal to the arteriotomy with a vascular clamp to achieve active flow reversal. To confirm flow reversal, a saline bolus was delivered into the venous flow line on both ?High? and ?Low? flow settings of the Flow Controller. Angiograms were performed with slow injections of a small amount of contrast filling just past the lesion to minimize antegrade transmission of micro-bubbles. ? ?Prior to lesion manipulation, heart rate (03BCW) and systolic BP ([id number]) were managed upwards to optimize flow reversal and procedural neuroprotection. The lesion was crossed with an 0.014? ENROUTE? guidewire and pre-dilation of the lesion was performed with a 65m x 337mrapid exchange 0.014? compatible balloon catheter to 8 atmospheres for 30 seconds. Stenting was performed with an 34m70m 20m32mROUTE? Transcarotid stent, sized appropriately to the right CCA. AP and lateral angiograms (gentle contrast injections) were performed to confirm stent placement and arterial wall stent apposition. ? ?[facility], antegrade flow was restored by releasing the clamp on the CCA then closing the NPS stopcocks to the flow lines. The Transcarotid Arterial Sheath was removed and the pre-closure suture was tied. Heparin reversal was employed and a drain was placed. ? ?The Venous Return Sheath was removed and hemostasis was achieved with brief manual compression. ? ?The surgical incision was irrigated, hemostasis achieved with use of thrombin product and cautery.  The wound was closed in layers of 2-0 Vicryl suture with Dermabond skin ? ?The patient tolerated the procedure well and was extubated on the table. The patient was moving all four extremities to command prior to transfer to the recovery room. ? ?Given the complexity of the case a first assistant was necessary in order to expedient the procedure and safely perform the technical aspects of the operation.  Role included significant help in  exposure of the right common carotid artery, vital participation in wire handling for balloon angioplasty and stent placement, as well as stasis and wound closure ? ?JoshMacie Burows ?[facility]ATE OF DICTATION:   12/19/2021 ? ?

## 2021-12-19 NOTE — Anesthesia Postprocedure Evaluation (Signed)
Anesthesia Post Note ? ?Patient: Scott Jacobson ? ?Procedure(s) Performed: Right Transcarotid Artery Revascularization (Right: Neck) ? ?  ? ?Patient location during evaluation: PACU ?Anesthesia Type: General ?Level of consciousness: awake and alert ?Pain management: pain level controlled ?Vital Signs Assessment: post-procedure vital signs reviewed and stable ?Respiratory status: spontaneous breathing, nonlabored ventilation, respiratory function stable and patient connected to nasal cannula oxygen ?Cardiovascular status: blood pressure returned to baseline and stable ?Postop Assessment: no apparent nausea or vomiting ?Anesthetic complications: no ? ? ?No notable events documented. ? ?Last Vitals:  ?Vitals:  ? 12/19/21 1513 12/19/21 1638  ?BP: (!) 137/57 129/62  ?Pulse: 77 71  ?Resp: 15 15  ?Temp: 36.6 ?C 37 ?C  ?SpO2: 94% 95%  ?  ?Last Pain:  ?Vitals:  ? 12/19/21 1638  ?TempSrc: Oral  ?PainSc:   ? ? ?  ?  ?  ?  ?  ?  ? ?[place] Lighter ? ? ? ? ?

## 2021-12-20 ENCOUNTER — Encounter (HOSPITAL_COMMUNITY): Payer: Self-pay | Admitting: Vascular Surgery

## 2021-12-20 LAB — LIPID PANEL
Cholesterol: 101 mg/dL (ref 0–200)
HDL: 43 mg/dL (ref >40)
LDL Cholesterol: 46 mg/dL (ref 0–99)
Total CHOL/HDL Ratio: 2.3 RATIO
Triglycerides: 58 mg/dL (ref <150)
VLDL: 12 mg/dL (ref 0–40)

## 2021-12-20 LAB — BASIC METABOLIC PANEL
Anion gap: 6 (ref 5–15)
BUN: 14 mg/dL (ref 8–23)
CO2: 23 mmol/L (ref 22–32)
Calcium: 8 mg/dL — ABNORMAL LOW (ref 8.9–10.3)
Chloride: 111 mmol/L (ref 98–111)
Creatinine, Ser: 0.86 mg/dL (ref 0.61–1.24)
GFR, Estimated: >60 mL/min (ref >60)
Glucose, Bld: 101 mg/dL — ABNORMAL HIGH (ref 70–99)
Potassium: 3.7 mmol/L (ref 3.5–5.1)
Sodium: 140 mmol/L (ref 135–145)

## 2021-12-20 LAB — CBC
HCT: 34.7 % — ABNORMAL LOW (ref 39.0–52.0)
Hemoglobin: 12 g/dL — ABNORMAL LOW (ref 13.0–17.0)
MCH: 30.9 pg (ref 26.0–34.0)
MCHC: 34.6 g/dL (ref 30.0–36.0)
MCV: 89.4 fL (ref 80.0–100.0)
Platelets: 164 10*3/uL (ref 150–400)
RBC: 3.88 MIL/uL — ABNORMAL LOW (ref 4.22–5.81)
RDW: 13.1 % (ref 11.5–15.5)
WBC: 11.6 10*3/uL — ABNORMAL HIGH (ref 4.0–10.5)
nRBC: 0 % (ref 0.0–0.2)

## 2021-12-20 LAB — POCT ACTIVATED CLOTTING TIME
Activated Clotting Time: 137 seconds
Activated Clotting Time: 281 seconds

## 2021-12-20 MED ORDER — OXYCODONE-ACETAMINOPHEN 5-325 MG PO TABS: 1.0000 | ORAL_TABLET | Freq: Four times a day (QID) | ORAL | 0 refills | Status: DC | PRN

## 2021-12-20 NOTE — Progress Notes (Signed)
PHARMACIST LIPID MONITORING ? ? ?Scott Jacobson is a 71 y.o. male admitted on 12/19/2021 with symptomatic right ICA stenosis.  Pharmacy has been consulted to optimize lipid-lowering therapy with the indication of secondary prevention for clinical ASCVD. ? ?Recent Labs: ? ?Lipid Panel (last 6 months):   ?Lab Results  ?Component Value Date  ? CHOL 101 12/20/2021  ? TRIG 58 12/20/2021  ? HDL 43 12/20/2021  ? CHOLHDL 2.3 12/20/2021  ? VLDL 12 12/20/2021  ? LDLCALC 46 12/20/2021  ? ? ?Hepatic function panel (last 6 months):   ?Lab Results  ?Component Value Date  ? AST 31 12/16/2021  ? ALT 31 12/16/2021  ? ALKPHOS 74 12/16/2021  ? BILITOT 0.9 12/16/2021  ? ? ?SCr (since admission):   ?Serum creatinine: 0.86 mg/dL 12/20/21 0646 ?Estimated creatinine clearance: 85.5 mL/min ? ?Current therapy and lipid therapy tolerance ?Current lipid-lowering therapy: atorvastatin '40mg'$  ?Previous lipid-lowering therapies (if applicable): n/a ?Documented or reported allergies or intolerances to lipid-lowering therapies (if applicable): n/a ? ?Assessment:   ?Patient prefers no changes in lipid-lowering therapy at this time. ? ?Plan:   ? ?1.Statin intensity (high intensity recommended for all patients regardless of the LDL):  No statin changes. The patient is already on a high intensity statin. ? ?2.Add ezetimibe (if any one of the following):   Not indicated at this time. ? ?3.Refer to lipid clinic:   No ? ?4.Follow-up with:  Primary care provider - Neale Burly, MD ? ?5.Follow-up labs after discharge:  No changes in lipid therapy, repeat a lipid panel in one year.    ? ?Erin Hearing PharmD., BCPS ?Clinical Pharmacist ?12/20/2021 11:19 AM ? ?

## 2021-12-20 NOTE — Progress Notes (Signed)
Pt discharged to home with family.  Pt's IV's removed/  Pt left with all of their personal belongings.  Pt taken off telemetry and CCMD notified.  AVS documentation reviewed and sent home with PT and all questions answered.   ?

## 2021-12-20 NOTE — Discharge Summary (Signed)
?Carotid Discharge Summary ? ? ? ? ?Scott Jacobson ?[birth year] 71 y.o. male ? ?[id number] ? ?Admission Date: ?12/19/2021 ? ?Discharge Date: ?12/20/2021 ? ?Physician: ?Broadus Korver, MD ? ?Admission Diagnosis: ?Carotid stenosis [I65.29] ? ?Hospital Course:  ?The patient was admitted to the hospital and taken to the operating room on 12/19/2021 and underwent right transcarotid artery revascularization (TCAR). The pt tolerated the procedure well and was transported to the PACU in excellent condition. ? ? By POD 1, the pt neuro status remained intact. Right neck incision clean, dry and intact without swelling or hematoma. Moving extremities without issues. Hemodynamically stable. The remainder of the hospital course consisted of increasing mobilization and increasing intake of solids without difficulty. He remained stable for discharge home. He will continue Aspirin, statin, Plavix. PDMP was reviewed and post operative pain medication sent to patients pharmacy. He will follow up in 3-4 weeks with carotid duplex.  ? ? ?Recent Labs  ?  12/20/21 ?0646  ?NA 140  ?K 3.7  ?CL 111  ?CO2 23  ?GLUCOSE 101*  ?BUN 14  ?CALCIUM 8.0*  ? ?Recent Labs  ?  12/19/21 ?5916 12/20/21 ?3846  ?WBC 12.0* 11.6*  ?HGB 13.2 12.0*  ?HCT 37.7* 34.7*  ?PLT 184 164  ? ?No results for input(s): INR in the last 72 hours. ? ? ? ? ?Discharge Diagnosis:  ?Carotid stenosis [I65.29] ? ?Secondary Diagnosis: ?Patient Active Problem List  ? Diagnosis Date Noted  ? Carotid stenosis 12/19/2021  ? S/P CABG x 4 10/03/2016  ? Coronary artery disease involving native heart without angina pectoris 08/23/2016  ? HLD (hyperlipidemia) 08/23/2016  ? Acute ST elevation myocardial infarction (STEMI) of inferior wall ([place]) 08/21/2016  ? Acute ST elevation myocardial infarction (STEMI) involving right coronary artery ([place])   ? Nausea and vomiting 02/06/2016  ? Macular degeneration   ? BPH (benign prostatic hyperplasia)   ? Rhegmatogenous retinal detachment of right eye  02/05/2016  ? ?Past Medical History:  ?Diagnosis Date  ? Anginal pain ([place])   ? Arthritis   ? "probably"  ? BPH (benign prostatic hyperplasia)   ? Carotid artery occlusion   ? Coronary artery disease   ? Dyspnea   ? GERD (gastroesophageal reflux disease)   ? History of kidney stones   ? Hypertension   ? Kidney stones   ? Macular degeneration   ? Myocardial infarction [facility])   ? Seasonal allergies   ? ? ?Allergies as of 12/20/2021   ?No Known Allergies ?  ? ?  ?Medication List  ?  ? ?TAKE these medications   ? ?aspirin EC 81 MG tablet ?Take 81 mg by mouth daily. Swallow whole. ?  ?atorvastatin 40 MG tablet ?Commonly known as: LIPITOR ?TAKE 1 TABLET BY MOUTH EVERY DAY ?  ?clopidogrel 75 MG tablet ?Commonly known as: PLAVIX ?Take 75 mg by mouth daily. ?  ?Fish Oil 1000 MG Caps ?Take 1,000-2,000 mg by mouth See admin instructions. Take 2000 mg by mouth in the evening and 1000 mg at bedtime ?  ?loratadine 10 MG tablet ?Commonly known as: CLARITIN ?Take 10 mg by mouth every evening. ?  ?metoprolol tartrate 25 MG tablet ?Commonly known as: LOPRESSOR ?Take 1 tablet (25 mg total) by mouth 2 (two) times daily. ?  ?multivitamin with minerals Tabs tablet ?Take 1 tablet by mouth daily. ?  ?oxyCODONE-acetaminophen 5-325 MG tablet ?Commonly known as: PERCOCET/ROXICET ?Take 1 tablet by mouth every 6 (six) hours as needed for moderate pain. ?  ?pantoprazole 40 MG  tablet ?Commonly known as: PROTONIX ?Take 40 mg by mouth daily before breakfast. ?  ?PRESERVISION AREDS 2 PO ?Take 1 capsule by mouth in the morning and at bedtime. ?  ?ramipril 5 MG capsule ?Commonly known as: ALTACE ?Take 5 mg by mouth in the morning. ?  ?REFRESH OP ?Place 1 drop into both eyes in the morning. ?  ?tamsulosin 0.4 MG Caps capsule ?Commonly known as: FLOMAX ?Take 0.4 mg by mouth in the morning. ?  ? ?  ? ? ? ?Discharge Instructions: ? ? ?[facility] [place] ?Discharge Instructions Carotid Endarterectomy (CEA) ? ?Please refer to the  following instructions for your post-procedure care. Your surgeon or physician assistant will discuss any changes with you. ? ?Activity ? ?You are encouraged to walk as much as you can. You can slowly return to normal activities but must avoid strenuous activity and heavy lifting until your doctor tell you it's OK. Avoid activities such as vacuuming or swinging a golf club. You can drive after one week if you are comfortable and you are no longer taking prescription pain medications. It is normal to feel tired for serval weeks after your surgery. It is also normal to have difficulty with sleep habits, eating, and bowel movements after surgery. These will go away with time. ? ?Bathing/Showering ? ?You may shower after you come home. Do not soak in a bathtub, hot tub, or swim until the incision heals completely. ? ?Incision Care ? ?Shower every day. Clean your incision with mild soap and water. Pat the area dry with a clean towel. You do not need a bandage unless otherwise instructed. Do not apply any ointments or creams to your incision. You may have skin glue on your incision. Do not peel it off. It will come off on its own in about one week. Your incision may feel thickened and raised for several weeks after your surgery. This is normal and the skin will soften over time. For Men Only: It's OK to shave around the incision but do not shave the incision itself for 2 weeks. It is common to have numbness under your chin that could last for several months. ? ?Diet ? ?Resume your normal diet. There are no special food restrictions following this procedure. A low fat/low cholesterol diet is recommended for all patients with vascular disease. In order to heal from your surgery, it is CRITICAL to get adequate nutrition. Your body requires vitamins, minerals, and protein. Vegetables are the best source of vitamins and minerals. Vegetables also provide the perfect balance of protein. Processed food has little nutritional  value, so try to avoid this. ? ?Medications ? ?Resume taking all of your medications unless your doctor or physician assistant tells you not to.  If your incision is causing pain, you may take over-the- counter pain relievers such as acetaminophen (Tylenol). If you were prescribed a stronger pain medication, please be aware these medications can cause nausea and constipation.  Prevent nausea by taking the medication with a snack or meal. Avoid constipation by drinking plenty of fluids and eating foods with a high amount of fiber, such as fruits, vegetables, and grains. Do not take Tylenol if you are taking prescription pain medications. ? ?Follow Up ? ?Our office will schedule a follow up appointment 2-3 weeks following discharge. ? ?Please call us immediately for any of the following conditions ? ?Increased pain, redness, drainage (pus) from your incision site. ?Fever of 101 degrees or higher. ?If you should develop stroke (  slurred speech, difficulty swallowing, weakness on one side of your body, loss of vision) you should call 911 and go to the nearest emergency room. ? ?Reduce your risk of vascular disease: ? ?Stop smoking. If you would like help call [facility] at 1-800-QUIT-NOW 989-300-3948) or [place] at ([phone]. ?Manage your cholesterol ?Maintain a desired weight ?Control your diabetes ?Keep your blood pressure down ? ?If you have any questions, please call the office at [phone]. ? ?Prescriptions given: ?Oxycodone- Acetaminophen #20 No Refill ? ?Disposition: Home ? ?Patient's condition: is Excellent ? ?Follow up: ?1. Dr. Virl Cagey in 2 weeks. ? ? ?Sherisa Gilvin PA-C ?[facility] ?[phone] ? ? ?--- For [facility] use --- ?  ?Modified Rankin score at D/C (0-6): 0 ? ?IV medication needed for:  ?1. Hypertension: No ?2. Hypotension: No ? ?Post-op Complications: No ? ?1. Post-op CVA or TIA: No ? ?If yes: Event classification (right eye, left eye, right cortical, left cortical,  verterobasilar, other): n/a ? ?If yes: Timing of event (intra-op, <6 hrs post-op, >=6 hrs post-op, unknown): n/a ? ?2. CN injury: No ? ?If yes: CN not injuried  ? ?3. Myocardial infarction: No ? ?If yes: Dx by (EKG

## 2021-12-20 NOTE — Progress Notes (Addendum)
?  Progress Note ? ? ? ?12/20/2021 ?7:13 AM ?1 Day Post-Op ? ?Subjective:  no complaints ? ? ?Vitals:  ? 12/20/21 0200 12/20/21 0400  ?BP: (!) 142/58 (!) 122/57  ?Pulse: 73 (!) 42  ?Resp: 13 17  ?Temp: 98.5 ?F (36.9 ?C) 98.3 ?F (36.8 ?C)  ?SpO2: 94% 94%  ? ?Physical Exam: ?Cardiac:  regular ?Lungs:  non labored ?Incisions:  right neck incision is c/d/I without swelling or hematoma. Mild ecchymosis present ?Extremities:  moving all extremities without deficits ?Neurologic: alert and oriented ? ?CBC ?   ?Component Value Date/Time  ? WBC 11.6 (H) 12/20/2021 0646  ? RBC 3.88 (L) 12/20/2021 0646  ? HGB 12.0 (L) 12/20/2021 0646  ? HCT 34.7 (L) 12/20/2021 0646  ? PLT 164 12/20/2021 0646  ? MCV 89.4 12/20/2021 0646  ? MCH 30.9 12/20/2021 0646  ? MCHC 34.6 12/20/2021 0646  ? RDW 13.1 12/20/2021 0646  ? ? ?BMET ?   ?Component Value Date/Time  ? NA 140 12/16/2021 1002  ? K 4.3 12/16/2021 1002  ? CL 106 12/16/2021 1002  ? CO2 28 12/16/2021 1002  ? GLUCOSE 108 (H) 12/16/2021 1002  ? BUN 13 12/16/2021 1002  ? BUN 17 10/11/2021 1344  ? CREATININE 1.08 12/19/2021 0517  ? CALCIUM 9.0 12/16/2021 1002  ? GFRNONAA >60 12/19/2021 0517  ? GFRAA >60 10/06/2016 0235  ? ? ?INR ?   ?Component Value Date/Time  ? INR 1.0 12/16/2021 1002  ? ? ? ?Intake/Output Summary (Last 24 hours) at 12/20/2021 0713 ?Last data filed at 12/20/2021 0530 ?Gross per 24 hour  ?Intake 2452.5 ml  ?Output 1820 ml  ?Net 632.5 ml  ? ? ? ?Assessment/Plan:  71 y.o. male is s/p right sided transcarotid artery stenting 1 Day Post-Op  ? ?Neurologically intact ?Right neck incision is clean, dry and intact. Mild ecchymosis present ?Hemodynamically stable ?Stable for discharge home ?He will continue Aspirin, Plavix, Statin ?He will have follow up in 3-4 weeks with Carotid duplex ? ? ?Karoline Caldwell, PA-C ?[facility] ?[phone] ?12/20/2021 ? ? ?VASCULAR STAFF ADDENDUM: ?I have independently interviewed and examined the patient. ?I agree with the above.  ?Doing  well, no issues, home today. ? ?J. Melene Muller, MD ?[facility] ?Office Phone Number: [phone] ?12/20/2021 8:07 AM ? ? ?

## 2021-12-20 NOTE — Discharge Instructions (Signed)
   [facility] [place]  Discharge Instructions   Carotid Surgery  Please refer to the following instructions for your post-procedure care. Your surgeon or physician assistant will discuss any changes with you.  Activity  You are encouraged to walk as much as you can. You can slowly return to normal activities but must avoid strenuous activity and heavy lifting until your doctor tell you it's okay. Avoid activities such as vacuuming or swinging a golf club. You can drive after one week if you are comfortable and you are no longer taking prescription pain medications. It is normal to feel tired for serval weeks after your surgery. It is also normal to have difficulty with sleep habits, eating, and bowel movements after surgery. These will go away with time.  Bathing/Showering  Shower daily after you go home. Do not soak in a bathtub, hot tub, or swim until the incision heals completely.  Incision Care  Shower every day. Clean your incision with mild soap and water. Pat the area dry with a clean towel. You do not need a bandage unless otherwise instructed. Do not apply any ointments or creams to your incision. You may have skin glue on your incision. Do not peel it off. It will come off on its own in about one week. Your incision may feel thickened and raised for several weeks after your surgery. This is normal and the skin will soften over time.   For Men Only: It's okay to shave around the incision but do not shave the incision itself for 2 weeks. It is common to have numbness under your chin that could last for several months.  Diet  Resume your normal diet. There are no special food restrictions following this procedure. A low fat/low cholesterol diet is recommended for all patients with vascular disease. In order to heal from your surgery, it is CRITICAL to get adequate nutrition. Your body requires vitamins, minerals, and protein. Vegetables are the best source of  vitamins and minerals. Vegetables also provide the perfect balance of protein. Processed food has little nutritional value, so try to avoid this.  Medications  Resume taking all of your medications unless your doctor or physician assistant tells you not to. If your incision is causing pain, you may take over-the- counter pain relievers such as acetaminophen (Tylenol). If you were prescribed a stronger pain medication, please be aware these medications can cause nausea and constipation. Prevent nausea by taking the medication with a snack or meal. Avoid constipation by drinking plenty of fluids and eating foods with a high amount of fiber, such as fruits, vegetables, and grains.   Do not take Tylenol if you are taking prescription pain medications.  Follow Up  Our office will schedule a follow up appointment 2-3 weeks following discharge.  Please call us immediately for any of the following conditions  . Increased pain, redness, drainage (pus) from your incision site. . Fever of 101 degrees or higher. . If you should develop stroke (slurred speech, difficulty swallowing, weakness on one side of your body, loss of vision) you should call 911 and go to the nearest emergency room. .  Reduce your risk of vascular disease:  . Stop smoking. If you would like help call [facility] at 1-800-QUIT-NOW (1-800-784-8669) or [facility] at 336-586-4000. . Manage your cholesterol . Maintain a desired weight . Control your diabetes . Keep your blood pressure down .  If you have any questions, please call the office at [phone]. 

## 2021-12-24 ENCOUNTER — Encounter (HOSPITAL_COMMUNITY): Payer: Self-pay | Admitting: *Deleted

## 2021-12-24 ENCOUNTER — Other Ambulatory Visit: Payer: Self-pay

## 2021-12-24 ENCOUNTER — Emergency Department (HOSPITAL_COMMUNITY): Payer: Medicare Other

## 2021-12-24 ENCOUNTER — Emergency Department (HOSPITAL_COMMUNITY)
Admission: EM | Admit: 2021-12-24 | Discharge: 2021-12-24 | Disposition: A | Discharge status: Home / Self Care | Payer: Medicare Other | Attending: Emergency Medicine | Admitting: Emergency Medicine

## 2021-12-24 DIAGNOSIS — R600 Localized edema: Secondary | ICD-10-CM | POA: Insufficient documentation

## 2021-12-24 DIAGNOSIS — Z79899 Other long term (current) drug therapy: Secondary | ICD-10-CM | POA: Insufficient documentation

## 2021-12-24 DIAGNOSIS — M25561 Pain in right knee: Secondary | ICD-10-CM | POA: Insufficient documentation

## 2021-12-24 DIAGNOSIS — M79661 Pain in right lower leg: Secondary | ICD-10-CM | POA: Diagnosis not present

## 2021-12-24 DIAGNOSIS — S838X1A Sprain of other specified parts of right knee, initial encounter: Secondary | ICD-10-CM | POA: Diagnosis not present

## 2021-12-24 DIAGNOSIS — Z7982 Long term (current) use of aspirin: Secondary | ICD-10-CM | POA: Diagnosis not present

## 2021-12-24 DIAGNOSIS — Z7902 Long term (current) use of antithrombotics/antiplatelets: Secondary | ICD-10-CM | POA: Diagnosis not present

## 2021-12-24 NOTE — ED Triage Notes (Signed)
Concerned he may have a blood clot in leg ?

## 2021-12-24 NOTE — ED Triage Notes (Signed)
Right knee pain x 4 days, states he is unable to bend knee ?

## 2021-12-24 NOTE — ED Provider Notes (Signed)
?[facility] ?Provider Note ? ? ?CSN: [id number] ?Arrival date & time: 12/24/21  1456 ? ?  ? ?History ? ?Chief Complaint  ?Patient presents with  ? Knee Pain  ? ? ?Scott Jacobson is a 71 y.o. male. ? ? ?Knee Pain ?Associated symptoms: no back pain, no fever and no neck pain   ? ?  ? ?Scott Jacobson is a 71 y.o. male who presents to the Emergency Department complaining of right knee pain upon waking this morning.  He recently underwent a right transcarotid artery revascularization on 12/19/2021.  He was given injection of a blood thinner at time of his procedure.  And takes dual antiplatelet therapy.  He woke this morning with pain to the anterior right knee that worsens with bending.  He denies any knee pain at rest or when standing.  Pain is only associated with bending.  He denies any instability of the knee.  He states given that his recent hospital procedure that he is concerned he may have a blood clot to his leg.  Denies any swelling or redness of the extremity.  No known injury.  Denies any chest pain or shortness of breath. ? ?Home Medications ?Prior to Admission medications   ?Medication Sig Start Date End Date Taking? Authorizing Provider  ?aspirin EC 81 MG tablet Take 81 mg by mouth daily. Swallow whole.    [provider]  ?atorvastatin (LIPITOR) 40 MG tablet TAKE 1 TABLET BY MOUTH EVERY DAY 05/01/20   Burnell Blanks, MD  ?clopidogrel (PLAVIX) 75 MG tablet Take 75 mg by mouth daily. 10/01/21   [provider]  ?loratadine (CLARITIN) 10 MG tablet Take 10 mg by mouth every evening.    [provider]  ?metoprolol tartrate (LOPRESSOR) 25 MG tablet Take 1 tablet (25 mg total) by mouth 2 (two) times daily. 08/23/16   Richardson Dopp T, PA-C  ?Multiple Vitamin (MULTIVITAMIN WITH MINERALS) TABS tablet Take 1 tablet by mouth daily.    [provider]  ?Multiple Vitamins-Minerals (PRESERVISION AREDS 2 PO) Take 1 capsule by mouth in the morning and at  bedtime.    [provider]  ?Omega-3 Fatty Acids (FISH OIL) 1000 MG CAPS Take 1,000-2,000 mg by mouth See admin instructions. Take 2000 mg by mouth in the evening and 1000 mg at bedtime    [provider]  ?oxyCODONE-acetaminophen (PERCOCET/ROXICET) 5-325 MG tablet Take 1 tablet by mouth every 6 (six) hours as needed for moderate pain. 12/20/21   Baglia, Corrina, PA-C  ?pantoprazole (PROTONIX) 40 MG tablet Take 40 mg by mouth daily before breakfast.    [provider]  ?Polyvinyl Alcohol-Povidone (REFRESH OP) Place 1 drop into both eyes in the morning.    [provider]  ?ramipril (ALTACE) 5 MG capsule Take 5 mg by mouth in the morning. 01/16/17   [provider]  ?tamsulosin (FLOMAX) 0.4 MG CAPS capsule Take 0.4 mg by mouth in the morning.    [provider]  ?   ? ?Allergies    ?Patient has no known allergies.   ? ?Review of Systems   ?Review of Systems  ?Constitutional:  Negative for chills and fever.  ?Respiratory:  Negative for shortness of breath.   ?Cardiovascular:  Negative for chest pain.  ?Musculoskeletal:  Positive for arthralgias (Right knee pain). Negative for back pain and neck pain.  ?Skin:  Negative for color change, rash and wound.  ?Neurological:  Negative for weakness and numbness.  ?All other  systems reviewed and are negative. ? ?Physical Exam ?Updated Vital Signs ?BP (!) 151/67 (BP Location: Right Arm)   Pulse 81   Temp 97.7 ?F (36.5 ?C) (Oral)   Resp 18   Ht '5\' 7"'$  (1.702 m)   Wt 92.5 kg   SpO2 96%   BMI 31.95 kg/m?  ?Physical Exam ?Vitals and nursing note reviewed.  ?Constitutional:   ?   General: He is not in acute distress. ?   Appearance: Normal appearance. He is not ill-appearing.  ?Cardiovascular:  ?   Rate and Rhythm: Normal rate and regular rhythm.  ?   Pulses: Normal pulses.  ?Pulmonary:  ?   Effort: Pulmonary effort is normal.  ?   Breath sounds: Normal breath sounds.  ?Musculoskeletal:     ?   General: Tenderness (Tenderness  on range of motion of the superior to the patella.  No palpable effusion or crepitus on ROM.  Patella tendon appears intact.  There is no tenderness of the posterior lower leg, and no palpable cord.  No erythema or excessive warmth of the knee) present.  ?Skin: ?   General: Skin is warm.  ?   Findings: No bruising, erythema or rash.  ?Neurological:  ?   General: No focal deficit present.  ?   Mental Status: He is alert.  ? ? ?ED Results / Procedures / Treatments   ?Labs ?(all labs ordered are listed, but only abnormal results are displayed) ?Labs Reviewed - No data to display ? ?EKG ?None ? ?Radiology ?US Venous Img Lower Unilateral Right ? ?Result Date: 12/24/2021 ?CLINICAL DATA:  Right lower leg pain and edema, discoloration EXAM: RIGHT LOWER EXTREMITY VENOUS DOPPLER ULTRASOUND TECHNIQUE: Gray-scale sonography with graded compression, as well as color Doppler and duplex ultrasound were performed to evaluate the lower extremity deep venous systems from the level of the common femoral vein and including the common femoral, femoral, profunda femoral, popliteal and calf veins including the posterior tibial, peroneal and gastrocnemius veins when visible. The superficial great saphenous vein was also interrogated. Spectral Doppler was utilized to evaluate flow at rest and with distal augmentation maneuvers in the common femoral, femoral and popliteal veins. COMPARISON:  None. FINDINGS: Contralateral Common Femoral Vein: Respiratory phasicity is normal and symmetric with the symptomatic side. No evidence of thrombus. Normal compressibility. Common Femoral Vein: No evidence of thrombus. Normal compressibility, respiratory phasicity and response to augmentation. Saphenofemoral Junction: No evidence of thrombus. Normal compressibility and flow on color Doppler imaging. Profunda Femoral Vein: No evidence of thrombus. Normal compressibility and flow on color Doppler imaging. Femoral Vein: No evidence of thrombus. Normal  compressibility, respiratory phasicity and response to augmentation. Popliteal Vein: No evidence of thrombus. Normal compressibility, respiratory phasicity and response to augmentation. Calf Veins: No evidence of thrombus. Normal compressibility and flow on color Doppler imaging. IMPRESSION: No evidence of deep venous thrombosis. Electronically Signed   By: Jerilynn Mages.  Shick M.D.   On: 12/24/2021 16:07  ? ?DG Knee Complete 4 Views Right ? ?Result Date: 12/24/2021 ?CLINICAL DATA:  Anteromedial right knee pain for 4 days after having a procedure were stent was placed and right side of neck. EXAM: RIGHT KNEE - COMPLETE 4+ VIEW COMPARISON:  None. FINDINGS: Surgical clips overlie the medial calf. No knee joint effusion. Mild chronic enthesopathic change at the quadriceps insertion on the patella. No acute fracture is seen. No dislocation. IMPRESSION: No acute abnormality. Mild chronic enthesopathic change at the quadriceps insertion on the patella. Electronically Signed   By: Jori Moll  Viola M.D.   On: 12/24/2021 16:47   ? ?Procedures ?Procedures  ? ? ?Medications Ordered in ED ?Medications - No data to display ? ?ED Course/ Medical Decision Making/ A&P ?  ?                        ?Medical Decision Making ?Patient here for evaluation of right knee pain after having a carotid procedure on 12/19/2021.  He is on dual antiplatelet therapy.  No history of DVT or PE.  He denies any chest pain or shortness of breath.  He describes having anterior knee pain with range of motion.  Pain resolves at rest. ? ?On exam, he has tenderness of the knee through range of motion mostly at the anterior portion of the patella.  No palpable effusion or crepitus.  There is no erythema or excessive warmth of the joint.  Clinically, I am doubtful patient's pain is related to DVT as his pain is mostly anterior and over the knee joint.  No concerning symptoms for septic joint.  Patient concerned about DVT given recent surgery, so will proceed with DVT  study. ? ?Amount and/or Complexity of Data Reviewed ?Radiology: ordered. ?   Details: Venous imaging of right lower extremity negative for presence of DVT.  X-ray of the knee shows no acute abnormality.  There is some encephalo

## 2021-12-24 NOTE — Discharge Instructions (Signed)
Wear the knee sleeve as needed for support.  You may take your pain medication as directed.  Please follow-up with the orthopedic provider listed to arrange follow-up appointment in 1 week if not improving. ? ? ?

## 2021-12-30 DIAGNOSIS — M7651 Patellar tendinitis, right knee: Secondary | ICD-10-CM | POA: Diagnosis not present

## 2022-01-06 ENCOUNTER — Other Ambulatory Visit: Payer: Self-pay

## 2022-01-06 DIAGNOSIS — I6523 Occlusion and stenosis of bilateral carotid arteries: Secondary | ICD-10-CM

## 2022-01-16 NOTE — Progress Notes (Signed)
?Office Note  ? ?HPI: Scott Jacobson is a 71 y.o. (04-08-1951) male presenting in follow-up status post 12/19/21 right TCAR for symptomatic ICA stenosis. ? ?Since discharge, has been doing well with no neurologic issues.  The day after discharge, he appreciated right knee pain which is waxed and waned.  X-ray demonstrated no bony abnormalities.  ? ?On exam today, Scott Jacobson was accompanied by his wife.  He continues to have motor difficulties however motor function in the left cheek, notably while smiling, has improved.  He also has some paresthesias in the left thigh.  He denied any new symptoms concerning for stroke, TIA, amaurosis. ? ?The pt is  on a statin for cholesterol management.  ?The pt is  on a daily aspirin.   Other AC:  plavix ?The pt is on medication for hypertension.   ?The pt is not diabetic.  ?Tobacco hx:  former ? ?Past Medical History:  ?Diagnosis Date  ? Anginal pain ([place])   ? Arthritis   ? "probably"  ? BPH (benign prostatic hyperplasia)   ? Carotid artery occlusion   ? Coronary artery disease   ? Dyspnea   ? GERD (gastroesophageal reflux disease)   ? History of kidney stones   ? Hypertension   ? Kidney stones   ? Macular degeneration   ? Myocardial infarction [facility])   ? Seasonal allergies   ? ? ?Past Surgical History:  ?Procedure Laterality Date  ? APPENDECTOMY    ? BACK SURGERY    ? CARDIAC CATHETERIZATION N/A 08/21/2016  ? Procedure: Left Heart Cath and Coronary Angiography;  Surgeon: Scott Blanks, MD;  Location: [place] [facility];  Service: Cardiovascular;  Laterality: N/A;  ? CARDIAC CATHETERIZATION N/A 08/21/2016  ? Procedure: Coronary Stent Intervention;  Surgeon: Scott Blanks, MD;  Location: [place] [facility];  Service: Cardiovascular;  Laterality: N/A;  ? CARDIAC SURGERY    ? quadruple bypass  ? CATARACT EXTRACTION W/ INTRAOCULAR LENS  IMPLANT, BILATERAL Bilateral   ? COLONOSCOPY    ? COLONOSCOPY WITH PROPOFOL N/A 03/05/2021  ? Procedure: COLONOSCOPY WITH PROPOFOL;  Surgeon:  Scott Quale, MD;  [facility];  Service: Gastroenterology;  Laterality: N/A;  11:00  ? CORONARY ARTERY BYPASS GRAFT N/A 10/03/2016  ? Procedure: CORONARY ARTERY BYPASS GRAFTING (CABG)x 4 with endoscopic harvesting of right saphenous vein ,LIMA-LAD SVG-DIAG SVG-OM SVG-RCA;  Surgeon: Scott Pollack, MD;  Location: [facility];  Service: Open Heart Surgery;  Laterality: N/A;  ? EYE SURGERY    ? GAS INSERTION Right 02/05/2016  ? Procedure: INSERTION OF [id number];  Surgeon: Scott Pedro, MD;  Location: [facility];  Service: Ophthalmology;  Laterality: Right;  ? HAND SURGERY Right   ? thumb; S/P "cut his arm"  ? LASER PHOTO ABLATION Right 02/05/2016  ? Procedure: LASER PHOTO ABLATION-HEADSCOPE LASER;  Surgeon: Scott Pedro, MD;  Location: [place];  Service: Ophthalmology;  Laterality: Right;  ? LUMBAR DISC SURGERY  ~ 2002  ? POLYPECTOMY  03/05/2021  ? Procedure: POLYPECTOMY;  Surgeon: Scott Jacobson, Scott Quince, MD;  [facility];  Service: Gastroenterology;;  ascending colon, sigmoid colon  ? RETINAL DETACHMENT REPAIR W/ SCLERAL BUCKLE LE Right 02/05/2016  ? SCLERAL BUCKLE Right 02/05/2016  ? Procedure: SCLERAL BUCKLE;  Surgeon: Scott Pedro, MD;  Location: [facility];  Service: Ophthalmology;  Laterality: Right;  ? TEE WITHOUT CARDIOVERSION N/A 10/03/2016  ? Procedure: TRANSESOPHAGEAL ECHOCARDIOGRAM (TEE);  Surgeon: Scott Pollack, MD;  Location: [facility];  Service: Open Heart Surgery;  Laterality: N/A;  ? TONSILLECTOMY    ? TRANSCAROTID ARTERY REVASCULARIZATION?  Right 12/19/2021  ? Procedure: Right Transcarotid Artery Revascularization;  Surgeon: Scott Jaidan, MD;  Location: [place] [facility] [place];  Service: Vascular;  Laterality: Right;  ? ? ?Social History  ? ?Socioeconomic History  ? Marital status: Married  ?  Spouse name: Not on file  ? Number of children: Not on file  ? Years of education: 23  ? Highest education level: Not on file  ?Occupational History  ? Not on file  ?Tobacco Use  ? Smoking status: Former  ?   Packs/day: 3.00  ?  Years: 16.00  ?  Pack years: 48.00  ?  Types: Cigarettes  ?  Quit date: 02/04/1979  ?  Years since quitting: 42.9  ? Smokeless tobacco: Never  ?Vaping Use  ? Vaping Use: Never used  ?Substance and Sexual Activity  ? Alcohol use: Yes  ?  Comment: occassionally  ? Drug use: No  ? Sexual activity: Not Currently  ?Other Topics Concern  ? Not on file  ?Social History Narrative  ? Lives at home with his wife.  ? Step-children.  ? Right-handed.  ? 3 cups caffeine per day.  ? ?Social Determinants of Health  ? ?Financial Resource Strain: Not on file  ?Food Insecurity: Not on file  ?Transportation Needs: Not on file  ?Physical Activity: Not on file  ?Stress: Not on file  ?Social Connections: Not on file  ?Intimate Partner Violence: Not on file  ? ? ?Family History  ?Problem Relation Age of Onset  ? Hypertension Mother   ? Congestive Heart Failure Father   ? ? ?Current Outpatient Medications  ?Medication Sig Dispense Refill  ? aspirin EC 81 MG tablet Take 81 mg by mouth daily. Swallow whole.    ? atorvastatin (LIPITOR) 40 MG tablet TAKE 1 TABLET BY MOUTH EVERY DAY 90 tablet 2  ? clopidogrel (PLAVIX) 75 MG tablet Take 75 mg by mouth daily.    ? loratadine (CLARITIN) 10 MG tablet Take 10 mg by mouth every evening.    ? metoprolol tartrate (LOPRESSOR) 25 MG tablet Take 1 tablet (25 mg total) by mouth 2 (two) times daily. 60 tablet 11  ? Multiple Vitamin (MULTIVITAMIN WITH MINERALS) TABS tablet Take 1 tablet by mouth daily.    ? Multiple Vitamins-Minerals (PRESERVISION AREDS 2 PO) Take 1 capsule by mouth in the morning and at bedtime.    ? Omega-3 Fatty Acids (FISH OIL) 1000 MG CAPS Take 1,000-2,000 mg by mouth See admin instructions. Take 2000 mg by mouth in the evening and 1000 mg at bedtime    ? oxyCODONE-acetaminophen (PERCOCET/ROXICET) 5-325 MG tablet Take 1 tablet by mouth every 6 (six) hours as needed for moderate pain. 20 tablet 0  ? pantoprazole (PROTONIX) 40 MG tablet Take 40 mg by mouth daily before  breakfast.    ? Polyvinyl Alcohol-Povidone (REFRESH OP) Place 1 drop into both eyes in the morning.    ? ramipril (ALTACE) 5 MG capsule Take 5 mg by mouth in the morning.    ? tamsulosin (FLOMAX) 0.4 MG CAPS capsule Take 0.4 mg by mouth in the morning.    ? ?No current facility-administered medications for this visit.  ? ? ?No Known Allergies ? ? ?REVIEW OF SYSTEMS:  ? ?'[X]'$  denotes positive finding, '[ ]'$  denotes negative finding ?Cardiac  Comments:  ?Chest pain or chest pressure:    ?Shortness of breath upon exertion:    ?  Short of breath when lying flat:    ?Irregular heart rhythm:    ?    ?Vascular    ?Pain in calf, thigh, or hip brought on by ambulation:    ?Pain in feet at night that wakes you up from your sleep:     ?Blood clot in your veins:    ?Leg swelling:     ?    ?Pulmonary    ?Oxygen at home:    ?Productive cough:     ?Wheezing:     ?    ?Neurologic    ?Sudden weakness in arms or legs:     ?Sudden numbness in arms or legs:     ?Sudden onset of difficulty speaking or slurred speech:    ?Temporary loss of vision in one eye:     ?Problems with dizziness:     ?    ?Gastrointestinal    ?Blood in stool:     ?Vomited blood:     ?    ?Genitourinary    ?Burning when urinating:     ?Blood in urine:    ?    ?Psychiatric    ?Major depression:     ?    ?Hematologic    ?Bleeding problems:    ?Problems with blood clotting too easily:    ?    ?Skin    ?Rashes or ulcers:    ?    ?Constitutional    ?Fever or chills:    ? ? ?PHYSICAL EXAMINATION: ? ?There were no vitals filed for this visit. ? ? ?General:  WDWN in NAD; vital signs documented above ?Gait: Not observed ?HENT: WNL, normocephalic ?Pulmonary: normal non-labored breathing , without wheezing ?Cardiac: regular HR, ?Abdomen: soft, NT, no masses ?Skin: without rashes ?Vascular Exam/Pulses: ? Right Left  ?Radial 2+ (normal) 2+ (normal)  ?Ulnar 2+ (normal) 2+ (normal)  ?Femoral    ?Popliteal    ?DP 2+ (normal) 2+ (normal)  ?PT 2+ (normal) 2+ (normal)  ? ?Extremities:  without ischemic changes, without Gangrene , without cellulitis; without open wounds;  ?Musculoskeletal: no muscle wasting or atrophy  ?Neurologic: A&O X 3;  No focal weakness or paresthesias are detecte

## 2022-01-17 ENCOUNTER — Ambulatory Visit (HOSPITAL_COMMUNITY)
Admission: RE | Admit: 2022-01-17 | Discharge: 2022-01-17 | Disposition: A | Discharge status: Home / Self Care | Payer: Medicare Other | Source: Ambulatory Visit | Attending: Vascular Surgery | Admitting: Vascular Surgery

## 2022-01-17 ENCOUNTER — Encounter: Payer: Self-pay | Admitting: Vascular Surgery

## 2022-01-17 ENCOUNTER — Ambulatory Visit (INDEPENDENT_AMBULATORY_CARE_PROVIDER_SITE_OTHER): Payer: Medicare Other | Admitting: Vascular Surgery

## 2022-01-17 VITALS — BP 131/73 | HR 54 | Temp 98.1°F | Resp 20 | Ht 67.0 in | Wt 208.0 lb

## 2022-01-17 DIAGNOSIS — Z95828 Presence of other vascular implants and grafts: Secondary | ICD-10-CM

## 2022-01-17 DIAGNOSIS — I6523 Occlusion and stenosis of bilateral carotid arteries: Secondary | ICD-10-CM

## 2022-01-24 ENCOUNTER — Other Ambulatory Visit: Payer: Self-pay | Admitting: *Deleted

## 2022-01-24 DIAGNOSIS — I6523 Occlusion and stenosis of bilateral carotid arteries: Secondary | ICD-10-CM

## 2022-02-17 NOTE — Progress Notes (Signed)
? ? ?Chief Complaint  ?Patient presents with  ? Follow-up  ?  CAD  ? ?History of Present Illness: 71 yo male with history of HTN, HLD and CAD who is here today for cardiac follow up. He was admitted to [facility] November 2017 with an inferior STEMI. His sub-totally occluded RCA was treated with a bare metal stent. He was also found to have severe disease in the left main artery and Circumflex. He underwent 4V CABG (LIMA to LAD, SVG to Diagonal, SVG to OM, SVG to PDA) on 10/03/16 after he had completed 1 month of dual antiplatelet therapy post PCI/bare metal stent placement. LVEF 60-65% by echo November 2017. He had stroke-like symptoms in January 2023 and was seen by Neurology. Brain MRI with March 2013 and was found to have small cortically based infarcts. He was seen by Vascular surgery following finding of right carotid artery stenosis. He underwent right carotid artery stenting in March 2023. Cardiac monitor March 2023 with sinus with PVCs.  ? ?He is here today for follow up. The patient denies any chest pain, palpitations, lower extremity edema, orthopnea, PND, dizziness, near syncope or syncope. He has recovered from his stroke well. He notes some dyspnea with exertion but he has been inactive post stroke and has gained some weight.  ? ?Primary Care Physician: Neale Burly, MD ? ?Past Medical History:  ?Diagnosis Date  ? Anginal pain ([place])   ? Arthritis   ? "probably"  ? BPH (benign prostatic hyperplasia)   ? Carotid artery occlusion   ? Coronary artery disease   ? Dyspnea   ? GERD (gastroesophageal reflux disease)   ? History of kidney stones   ? Hypertension   ? Kidney stones   ? Macular degeneration   ? Myocardial infarction [facility])   ? Seasonal allergies   ? ? ?Past Surgical History:  ?Procedure Laterality Date  ? APPENDECTOMY    ? BACK SURGERY    ? CARDIAC CATHETERIZATION N/A 08/21/2016  ? Procedure: Left Heart Cath and Coronary Angiography;  Surgeon: Burnell Blanks, MD;  Location: [place] [facility];  Service: Cardiovascular;  Laterality: N/A;  ? CARDIAC CATHETERIZATION N/A 08/21/2016  ? Procedure: Coronary Stent Intervention;  Surgeon: Burnell Blanks, MD;  Location: [place] [facility];  Service: Cardiovascular;  Laterality: N/A;  ? CARDIAC SURGERY    ? quadruple bypass  ? CATARACT EXTRACTION W/ INTRAOCULAR LENS  IMPLANT, BILATERAL Bilateral   ? COLONOSCOPY    ? COLONOSCOPY WITH PROPOFOL N/A 03/05/2021  ? Procedure: COLONOSCOPY WITH PROPOFOL;  Surgeon: Harvel Quale, MD;  [facility];  Service: Gastroenterology;  Laterality: N/A;  11:00  ? CORONARY ARTERY BYPASS GRAFT N/A 10/03/2016  ? Procedure: CORONARY ARTERY BYPASS GRAFTING (CABG)x 4 with endoscopic harvesting of right saphenous vein ,LIMA-LAD SVG-DIAG SVG-OM SVG-RCA;  Surgeon: Gaye Pollack, MD;  Location: [place];  Service: Open Heart Surgery;  Laterality: N/A;  ? EYE SURGERY    ? GAS INSERTION Right 02/05/2016  ? Procedure: INSERTION OF [id number];  Surgeon: [place] Pedro, MD;  Location: [place];  Service: Ophthalmology;  Laterality: Right;  ? HAND SURGERY Right   ? thumb; S/P "cut his arm"  ? LASER PHOTO ABLATION Right 02/05/2016  ? Procedure: LASER PHOTO ABLATION-HEADSCOPE LASER;  Surgeon: [place] Pedro, MD;  Location: [place];  Service: Ophthalmology;  Laterality: Right;  ? LUMBAR DISC SURGERY  ~ 2002  ? POLYPECTOMY  03/05/2021  ? Procedure: POLYPECTOMY;  Surgeon: Jenetta Downer  Roney Marion, MD;  [facility];  Service: Gastroenterology;;  ascending colon, sigmoid colon  ? RETINAL DETACHMENT REPAIR W/ SCLERAL BUCKLE LE Right 02/05/2016  ? SCLERAL BUCKLE Right 02/05/2016  ? Procedure: SCLERAL BUCKLE;  Surgeon: [place] Pedro, MD;  Location: [place];  Service: Ophthalmology;  Laterality: Right;  ? TEE WITHOUT CARDIOVERSION N/A 10/03/2016  ? Procedure: TRANSESOPHAGEAL ECHOCARDIOGRAM (TEE);  Surgeon: Gaye Pollack, MD;  Location: [place];  Service: Open Heart Surgery;  Laterality: N/A;  ? TONSILLECTOMY    ? TRANSCAROTID  ARTERY REVASCULARIZATION?  Right 12/19/2021  ? Procedure: Right Transcarotid Artery Revascularization;  Surgeon: Broadus Cormac, MD;  Location: [place];  Service: Vascular;  Laterality: Right;  ? ? ?Current Outpatient Medications  ?Medication Sig Dispense Refill  ? aspirin EC 81 MG tablet Take 81 mg by mouth daily. Swallow whole.    ? atorvastatin (LIPITOR) 40 MG tablet TAKE 1 TABLET BY MOUTH EVERY DAY 90 tablet 2  ? clopidogrel (PLAVIX) 75 MG tablet Take 75 mg by mouth daily.    ? loratadine (CLARITIN) 10 MG tablet Take 10 mg by mouth every evening.    ? metoprolol tartrate (LOPRESSOR) 25 MG tablet Take 1 tablet (25 mg total) by mouth 2 (two) times daily. 60 tablet 11  ? Multiple Vitamin (MULTIVITAMIN WITH MINERALS) TABS tablet Take 1 tablet by mouth daily.    ? Multiple Vitamins-Minerals (PRESERVISION AREDS 2 PO) Take 1 capsule by mouth in the morning and at bedtime.    ? Omega-3 Fatty Acids (FISH OIL) 1000 MG CAPS Take 1,000-2,000 mg by mouth See admin instructions. Take 2000 mg by mouth in the evening and 1000 mg at bedtime    ? pantoprazole (PROTONIX) 40 MG tablet Take 40 mg by mouth daily before breakfast.    ? Polyvinyl Alcohol-Povidone (REFRESH OP) Place 1 drop into both eyes in the morning.    ? ramipril (ALTACE) 5 MG capsule Take 5 mg by mouth in the morning.    ? tamsulosin (FLOMAX) 0.4 MG CAPS capsule Take 0.4 mg by mouth in the morning.    ? ?No current facility-administered medications for this visit.  ? ? ?No Known Allergies ? ? ?Social History  ? ?Socioeconomic History  ? Marital status: Married  ?  Spouse name: Not on file  ? Number of children: Not on file  ? Years of education: 39  ? Highest education level: Not on file  ?Occupational History  ? Not on file  ?Tobacco Use  ? Smoking status: Former  ?  Packs/day: 3.00  ?  Years: 16.00  ?  Pack years: 48.00  ?  Types: Cigarettes  ?  Quit date: 02/04/1979  ?  Years since quitting: 43.0  ? Smokeless tobacco: Never  ?Vaping Use  ? Vaping Use: Never used   ?Substance and Sexual Activity  ? Alcohol use: Yes  ?  Comment: occassionally  ? Drug use: No  ? Sexual activity: Not Currently  ?Other Topics Concern  ? Not on file  ?Social History Narrative  ? Lives at home with his wife.  ? Step-children.  ? Right-handed.  ? 3 cups caffeine per day.  ? ?Social Determinants of Health  ? ?Financial Resource Strain: Not on file  ?Food Insecurity: Not on file  ?Transportation Needs: Not on file  ?Physical Activity: Not on file  ?Stress: Not on file  ?Social Connections: Not on file  ?Intimate Partner Violence: Not on file  ? ? ?Family History  ?  Problem Relation Age of Onset  ? Hypertension Mother   ? Congestive Heart Failure Father   ? ? ?Review of Systems:  As stated in the HPI and otherwise negative.  ? ?BP 112/66   Pulse (!) 51   Ht '5\' 7"'$  (1.702 m)   Wt 208 lb 3.2 oz (94.4 kg)   SpO2 98%   BMI 32.61 kg/m?  ? ?Physical Examination: ? ?General: Well developed, well nourished, NAD  ?HEENT: OP clear, mucus membranes moist  ?SKIN: warm, dry. No rashes. ?Neuro: No focal deficits  ?Musculoskeletal: Muscle strength 5/5 all ext  ?Psychiatric: Mood and affect normal  ?Neck: No JVD, no carotid bruits, no thyromegaly, no lymphadenopathy.  ?Lungs:Clear bilaterally, no wheezes, rhonci, crackles ?Cardiovascular: Regular rate and rhythm. No murmurs, gallops or rubs. ?Abdomen:Soft. Bowel sounds present. Non-tender.  ?Extremities: No lower extremity edema. Pulses are 2 + in the bilateral DP/PT. ? ?EKG:  EKG is  ordered today. ?The ekg ordered today demonstrates sinus brady, rate 51 bpm.  ? ?Recent Labs: ?12/16/2021: ALT 31 ?12/20/2021: BUN 14; Creatinine, Ser 0.86; Hemoglobin 12.0; Platelets 164; Potassium 3.7; Sodium 140  ? ?Lipid Panel ?Followed in primary care.  ?  ?Wt Readings from Last 3 Encounters:  ?02/18/22 208 lb 3.2 oz (94.4 kg)  ?01/17/22 208 lb (94.3 kg)  ?12/24/21 204 lb (92.5 kg)  ?  ? ?Other studies Reviewed: ?Additional studies/ records that were reviewed today include:  . ?Review of the above records demonstrates:  ? ?Assessment and Plan:  ? ?1. CAD s/p CABG without angina: He had 4V CABG in December 2017. No chest pain. Continue ASA, Plavix (recent carotid artery stenting in March 2023)

## 2022-02-18 ENCOUNTER — Ambulatory Visit: Payer: Medicare Other | Admitting: Cardiovascular Disease

## 2022-02-18 ENCOUNTER — Encounter: Payer: Self-pay | Admitting: Cardiovascular Disease

## 2022-02-18 ENCOUNTER — Telehealth: Payer: Self-pay | Admitting: Neurology

## 2022-02-18 VITALS — BP 112/66 | HR 51 | Ht 67.0 in | Wt 208.2 lb

## 2022-02-18 DIAGNOSIS — I25810 Atherosclerosis of coronary artery bypass graft(s) without angina pectoris: Secondary | ICD-10-CM

## 2022-02-18 DIAGNOSIS — I10 Essential (primary) hypertension: Secondary | ICD-10-CM

## 2022-02-18 DIAGNOSIS — I6523 Occlusion and stenosis of bilateral carotid arteries: Secondary | ICD-10-CM

## 2022-02-18 DIAGNOSIS — E785 Hyperlipidemia, unspecified: Secondary | ICD-10-CM

## 2022-02-18 NOTE — Patient Instructions (Signed)
Medication Instructions:  No changes *If you need a refill on your cardiac medications before your next appointment, please call your pharmacy*   Lab Work: none If you have labs (blood work) drawn today and your tests are completely normal, you will receive your results only by: MyChart Message (if you have MyChart) OR A paper copy in the mail If you have any lab test that is abnormal or we need to change your treatment, we will call you to review the results.   Testing/Procedures: Your physician has requested that you have an echocardiogram. Echocardiography is a painless test that uses sound waves to create images of your heart. It provides your doctor with information about the size and shape of your heart and how well your heart's chambers and valves are working. This procedure takes approximately one hour. There are no restrictions for this procedure.   Follow-Up: At [facility], you and your health needs are our priority.  As part of our continuing mission to provide you with exceptional heart care, we have created designated Provider Care Teams.  These Care Teams include your primary Cardiologist (physician) and Advanced Practice Providers (APPs -  Physician Assistants and Nurse Practitioners) who all work together to provide you with the care you need, when you need it.   Your next appointment:   12 month(s)  The format for your next appointment:   In Person  Provider:   Christopher McAlhany, MD     Important Information About Sugar       

## 2022-02-18 NOTE — Telephone Encounter (Signed)
LVM and sent mychart msg informing pt of r/s needed for 6/27- MD out. ?

## 2022-02-25 ENCOUNTER — Ambulatory Visit (HOSPITAL_COMMUNITY)
Admission: RE | Admit: 2022-02-25 | Discharge: 2022-02-25 | Disposition: A | Discharge status: Home / Self Care | Payer: Medicare Other | Source: Ambulatory Visit | Attending: Cardiovascular Disease | Admitting: Cardiovascular Disease

## 2022-02-25 DIAGNOSIS — I25810 Atherosclerosis of coronary artery bypass graft(s) without angina pectoris: Secondary | ICD-10-CM | POA: Diagnosis not present

## 2022-02-25 LAB — ECHOCARDIOGRAM COMPLETE
Area-P 1/2: 3.42 cm2
S' Lateral: 3.2 cm

## 2022-02-25 NOTE — Progress Notes (Signed)
*  PRELIMINARY RESULTS* Echocardiogram 2D Echocardiogram has been performed.  Scott Jacobson 02/25/2022, 10:09 AM

## 2022-04-01 ENCOUNTER — Ambulatory Visit: Payer: Medicare Other | Admitting: Neurology

## 2022-04-02 DIAGNOSIS — I25798 Atherosclerosis of other coronary artery bypass graft(s) with other forms of angina pectoris: Secondary | ICD-10-CM | POA: Diagnosis not present

## 2022-04-02 DIAGNOSIS — E7849 Other hyperlipidemia: Secondary | ICD-10-CM | POA: Diagnosis not present

## 2022-04-02 DIAGNOSIS — K219 Gastro-esophageal reflux disease without esophagitis: Secondary | ICD-10-CM | POA: Diagnosis not present

## 2022-04-02 DIAGNOSIS — Z8673 Personal history of transient ischemic attack (TIA), and cerebral infarction without residual deficits: Secondary | ICD-10-CM | POA: Diagnosis not present

## 2022-04-02 DIAGNOSIS — I10 Essential (primary) hypertension: Secondary | ICD-10-CM | POA: Diagnosis not present

## 2022-04-02 DIAGNOSIS — Z0000 Encounter for general adult medical examination without abnormal findings: Secondary | ICD-10-CM | POA: Diagnosis not present

## 2022-04-02 DIAGNOSIS — M7651 Patellar tendinitis, right knee: Secondary | ICD-10-CM | POA: Diagnosis not present

## 2022-04-07 ENCOUNTER — Telehealth: Payer: Self-pay | Admitting: Neurology

## 2022-04-07 IMAGING — CT CT ANGIO HEAD
1 of 10 series · 6 of 33 positions shown · non-contrast
Comparison: None.

CLINICAL DATA: Left-sided facial numbness and weakness

EXAM:
CT ANGIOGRAPHY HEAD AND NECK
TECHNIQUE: Multidetector CT imaging of the head and neck was performed using
the standard protocol during bolus administration of intravenous
contrast. Multiplanar CT image reconstructions and MIPs were
obtained to evaluate the vascular anatomy. Carotid stenosis
measurements (when applicable) are obtained utilizing NASCET
criteria, using the distal internal carotid diameter as the
denominator.

[Series 511: ax thin · axial · 0.52mm/px · z∈[-333,-80]mm · 6 of 355 slices shown]
[im 51/355  soft-tissue]
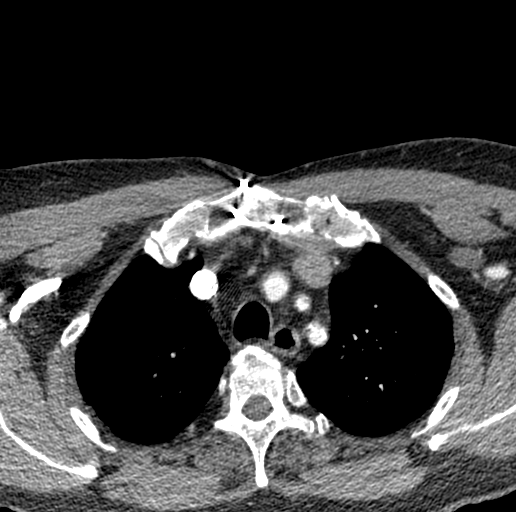
[im 102/355  bone]
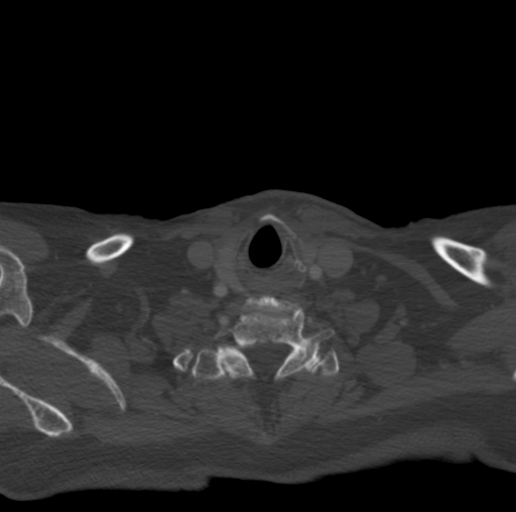
[im 152/355  soft-tissue]
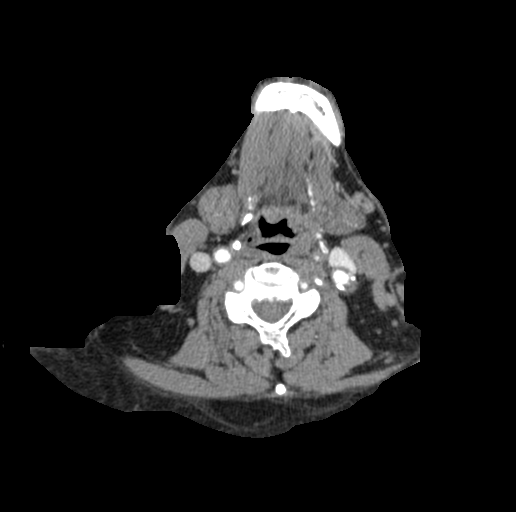
[im 203/355  bone]
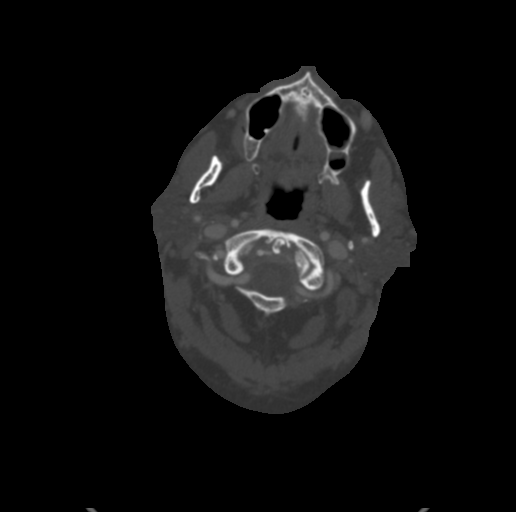
[im 253/355  soft-tissue]
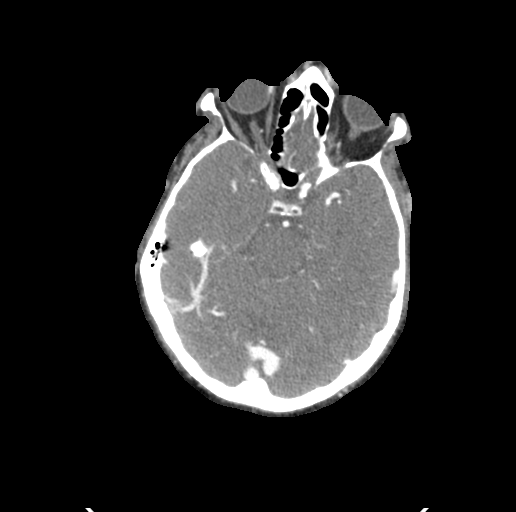
[im 304/355  bone]
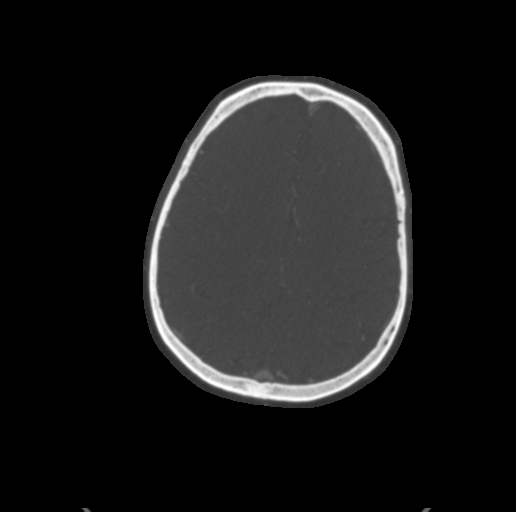

[6 of 33 positions shown; findings below may reference images not displayed]

RADIATION DOSE REDUCTION: This exam was performed according to the
departmental dose-optimization program which includes automated
exposure control, adjustment of the mA and/or kV according to
patient size and/or use of iterative reconstruction technique.

CONTRAST:  80mL OMNIPAQUE IOHEXOL 350 MG/ML SOLN
FINDINGS: CT HEAD FINDINGS

Brain: Possible hypodensity in the left hippocampus (series 5, image
12). No other hypodensity to suggest acute infarct. No acute
hemorrhage, mass, mass effect, or midline shift. No hydrocephalus or
extra-axial collection.

Vascular: Please see CTA findings below.

Skull: No acute osseous abnormality.

Sinuses: Air-fluid level in left maxillary sinus. Otherwise clear.
Status post bilateral lens replacements. Right globe scleral band.

Other: The mastoids are well aerated.

CTA NECK FINDINGS

Aortic arch: Standard branching. Imaged portion shows no evidence of
aneurysm or dissection. No significant stenosis of the major arch
vessel origins, although atherosclerotic calcifications extend into
the arch vessel origins.

Right carotid system: 60% stenosis in the right proximal internal
carotid artery and at the bifurcation, secondary to calcified and
noncalcified plaque. Approximately 50% narrowing at the origin of
the right external carotid artery. No dissection or occlusion.

Left carotid system: Calcified and noncalcified plaque at the
bifurcation and in the proximal left internal carotid artery, which
is not hemodynamically significant (less than 50% stenosis). No
dissection or occlusion.

Vertebral arteries: Codominant. No evidence of dissection, stenosis
(50% or greater) or occlusion.

Skeleton: Degenerative changes in the cervical spine with disc
height loss worst at C5-C6 and C7-T1. No acute osseous abnormality.

Other neck: Negative.

Upper chest: No focal pulmonary opacity or pleural effusion.
Paraseptal and centrilobular emphysema.

Review of the MIP images confirms the above findings

CTA HEAD FINDINGS

Anterior circulation:

Both internal carotid arteries are patent to the termini, without
stenosis or other abnormality.

A1 segments patent. Normal anterior communicating artery. Anterior
cerebral arteries are patent to their distal aspects.

No M1 stenosis or occlusion. Normal MCA bifurcations. Distal MCA
branches perfused and symmetric.

Posterior circulation:

Vertebral arteries patent to the vertebrobasilar junction, with mild
irregularity in the left V4 but no significant stenosis. Posterior
inferior cerebral arteries patent bilaterally.

Basilar patent to its distal aspect. Superior cerebellar arteries
patent bilaterally.

Patent proximal left P1, with severe stenosis in the distal left P1
(series 11, images 262-267), with focal reconstitution in the
proximal left P2, followed by complete occlusion. Possible distal
left P2 reconstitution, although evaluation is somewhat limited by
venous contamination. The right PCA is perfused from its origin
through its distal aspect without significant stenosis. The
bilateral posterior communicating arteries are not visualized.

Venous sinuses: As permitted by contrast timing, patent.

Anatomic variants: None significant

Review of the MIP images confirms the above findings
IMPRESSION: 1. Severe stenosis in the left P1 with complete occlusion of the
left P2. Evaluation for any reconstitution of the distal PCA is
limited by venous contamination; however, it is suspected that the
remainder of the PCA is non-opacified.
2. Small focus of hypodensity in the left posterior hippocampus.
Given the time course of the patient's symptoms, this may represent
a subacute infarct.
3. 60% stenosis of the proximal right ICA, with 50% stenosis of the
proximal right ECA.

These results were called by telephone at the time of interpretation
on 10/16/2021 at [DATE] to provider BRAYAN SOTERO ESCALANTE DUBON , who verbally
acknowledged these results.

## 2022-04-07 IMAGING — MR MR HEAD WO/W CM
15 series · 48 of 48 positions shown · IV contrast (GADAVIST)
Comparison: CTA of the head neck from the same day

CLINICAL DATA: Follow-up hemorrhagic stroke

EXAM:
MRI HEAD WITHOUT AND WITH CONTRAST
TECHNIQUE: Multiplanar, multiecho pulse sequences of the brain and surrounding
structures were obtained without and with intravenous contrast.
CONTRAST:  10mL GADAVIST GADOBUTROL 1 MMOL/ML IV SOLN

[Series 5: DWI · axial · 3.0mm · 1.36mm/px · z∈[-46,+95]mm · 5 of 96 slices shown (1 of 2)]
[im 1/96]
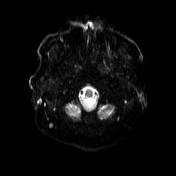
[im 24/96]
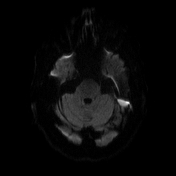
[im 48/96]
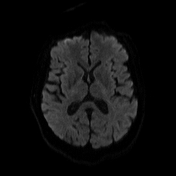
[im 72/96]
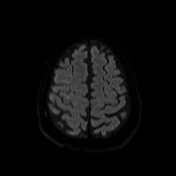
[im 96/96]
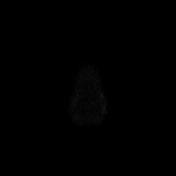

[Series 6: DWI · axial · 3.0mm · 1.36mm/px · z∈[-46,+95]mm · 3 of 48 slices shown (2 of 2)]
[im 1/48]
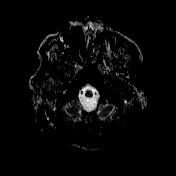
[im 24/48]
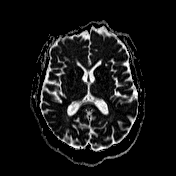
[im 48/48]
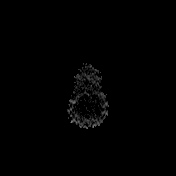

[Series 7: T1 · sagittal · 5.0mm · 0.75mm/px · 1 of 24 slices shown (1 of 4)]
[im 1/24]
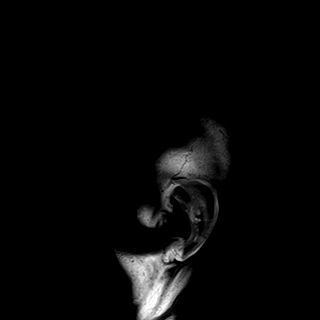

[Series 8: T2 · axial · 5.0mm · 0.62mm/px · 1 of 26 slices shown]
[im 1/26]
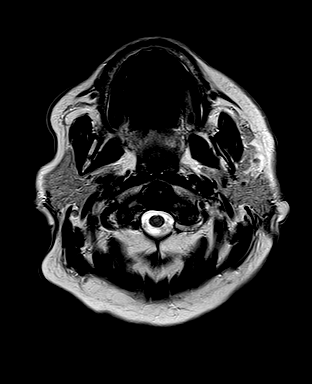

[Series 9: swi_images · axial · 3.0mm · 0.75mm/px · z∈[-61,+103]mm · 3 of 56 slices shown]
[im 1/56]
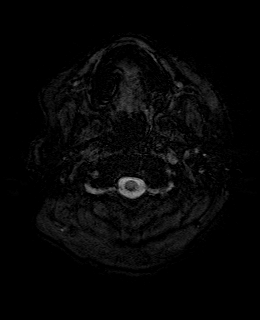
[im 28/56]
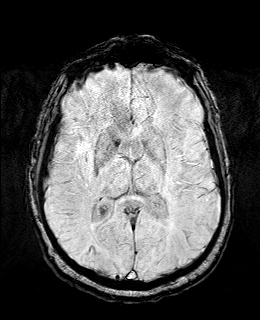
[im 56/56]
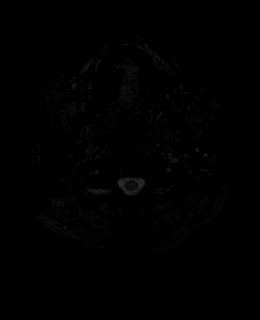

[Series 11: FLAIR · axial · 3.0mm · 0.75mm/px · z∈[-55,+97]mm · 3 of 52 slices shown]
[im 1/52]
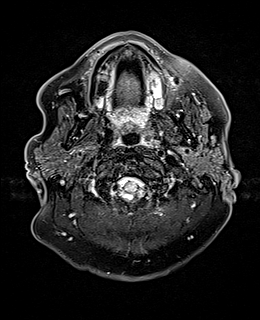
[im 26/52]
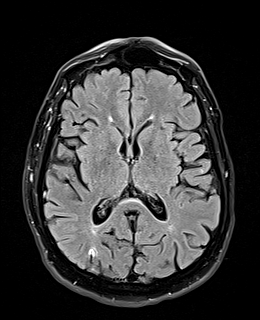
[im 52/52]
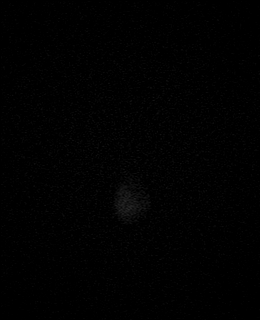

[Series 12: T1 · axial · 1.0mm · 0.94mm/px · z∈[-59,+99]mm · 9 of 159 slices shown (2 of 4)]
[im 1/159]
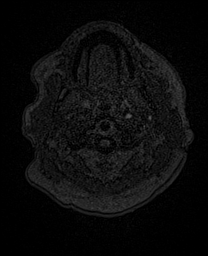
[im 20/159]
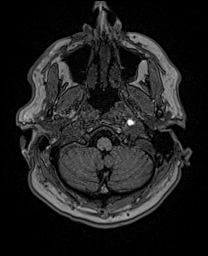
[im 40/159]
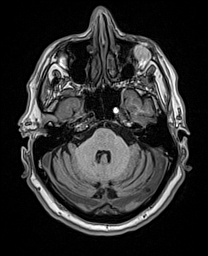
[im 60/159]
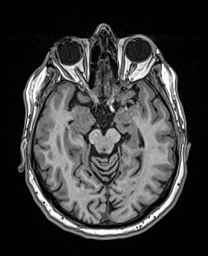
[im 80/159]
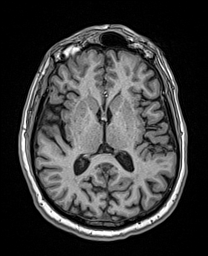
[im 99/159]
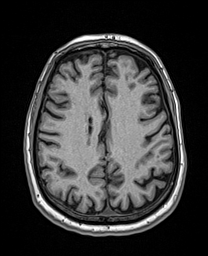
[im 119/159]
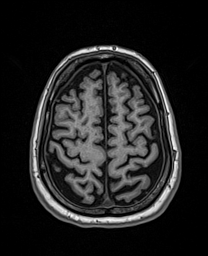
[im 139/159]
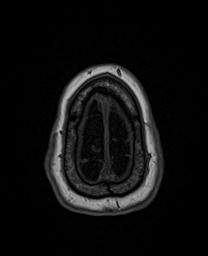
[im 159/159]
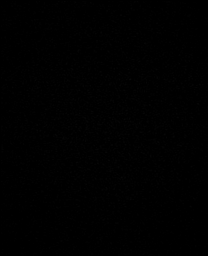

[Series 13: cor dwi_tracew · coronal · 5.0mm · 1.53mm/px · 3 of 56 slices shown]
[im 1/56]
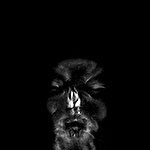
[im 28/56]
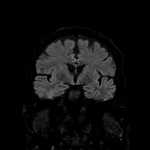
[im 56/56]
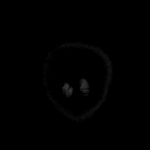

[Series 14: cor dwi_adc · coronal · 5.0mm · 1.53mm/px · 2 of 28 slices shown]
[im 1/28]
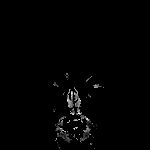
[im 28/28]
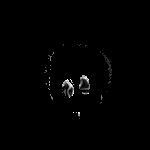

[Series 15: T2 post-contrast · coronal · 5.0mm · 0.57mm/px · 2 of 32 slices shown]
[im 1/32]
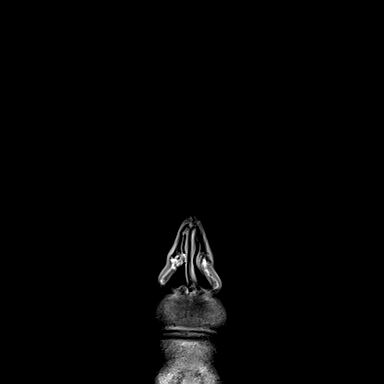
[im 32/32]
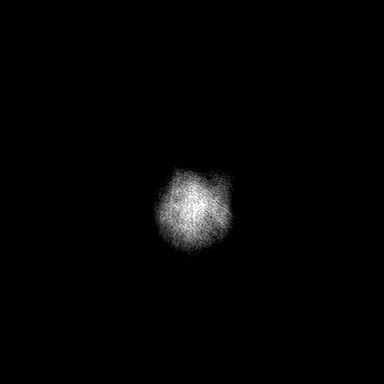

[Series 16: T1 post-contrast · axial · 1.0mm · 0.94mm/px · z∈[-59,+99]mm · 9 of 160 slices shown (1 of 3)]
[im 1/160]
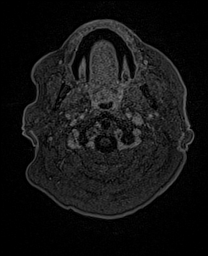
[im 20/160]
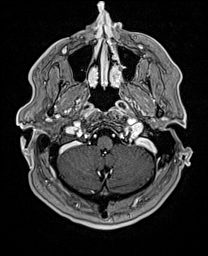
[im 40/160]
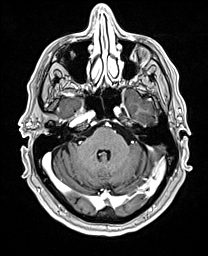
[im 60/160]
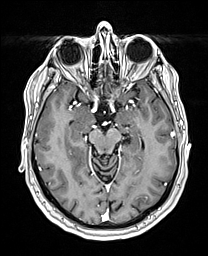
[im 80/160]
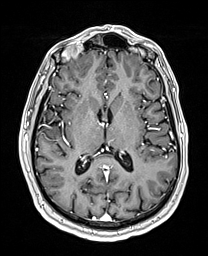
[im 100/160]
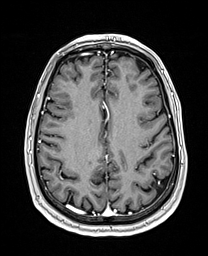
[im 120/160]
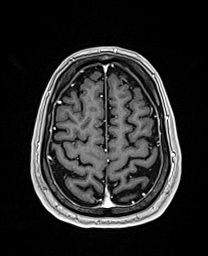
[im 140/160]
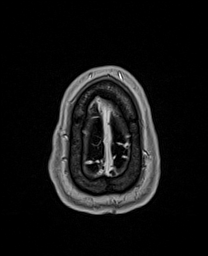
[im 160/160]
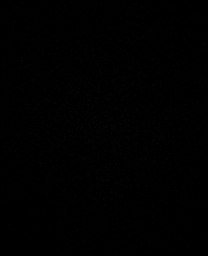

[Series 17: T1 · sagittal · 4.0mm · 0.94mm/px · 2 of 34 slices shown (3 of 4)]
[im 1/34]
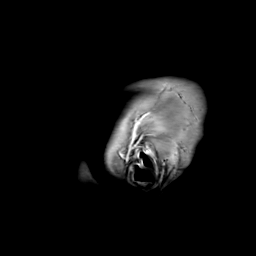
[im 34/34]
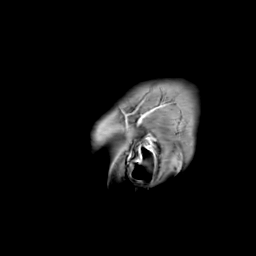

[Series 18: T1 · coronal · 4.0mm · 0.94mm/px · 2 of 41 slices shown (4 of 4)]
[im 1/41]
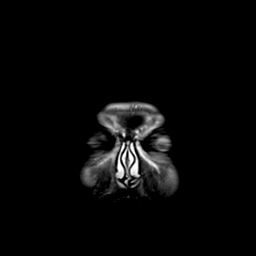
[im 41/41]
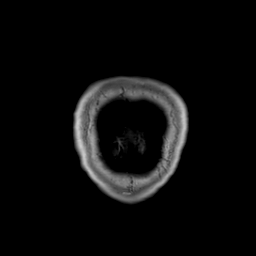

[Series 19: T1 post-contrast · coronal · 5.0mm · 0.43mm/px · 2 of 32 slices shown (2 of 3)]
[im 1/32]
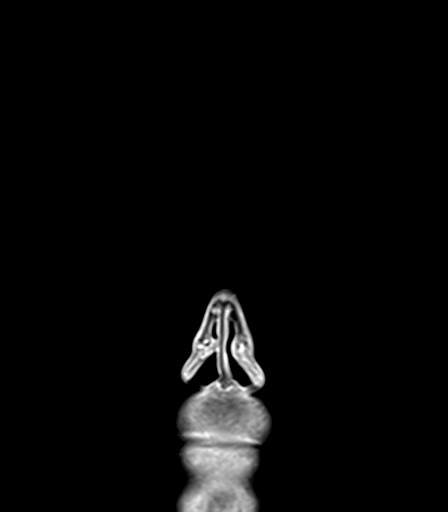
[im 32/32]
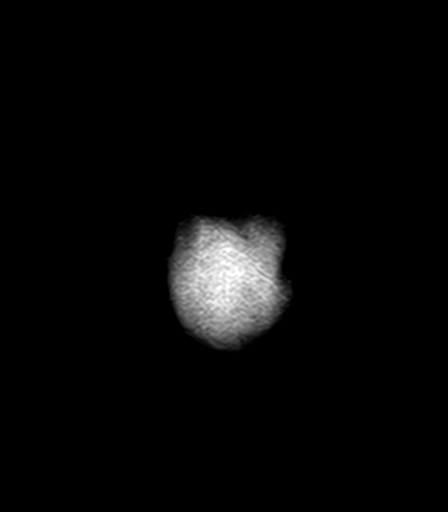

[Series 20: T1 post-contrast · sagittal · 5.0mm · 0.75mm/px · 1 of 24 slices shown (3 of 3)]
[im 1/24]
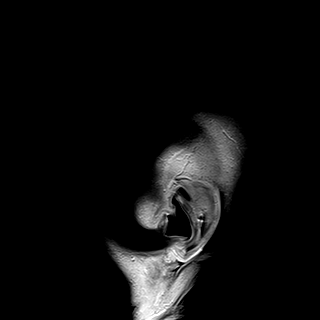

[48 of 48 positions shown; findings below may reference images not displayed]

FINDINGS: Brain: Scattered small remote cortically based infarcts along the
bilateral occipital and parietal convexities. No acute or subacute
infarct. No hemorrhage, hydrocephalus, or masslike finding. Age
normal brain volume. Mild chronic small vessel ischemia in the
hemispheric white matter.

Vascular: Normal flow voids and vascular enhancements.

Skull and upper cervical spine: Normal marrow signal

Sinuses/Orbits: Scleral band at the right eye.
IMPRESSION: 1. No acute or subacute infarct.
2. Scattered, small and remote cortically based infarcts along the
bilateral occipital and parietal convexities.

## 2022-04-07 IMAGING — CT CT ANGIO NECK
1 of 10 series · 6 of 33 positions shown · non-contrast
Comparison: None.

CLINICAL DATA: Left-sided facial numbness and weakness

EXAM:
CT ANGIOGRAPHY HEAD AND NECK
TECHNIQUE: Multidetector CT imaging of the head and neck was performed using
the standard protocol during bolus administration of intravenous
contrast. Multiplanar CT image reconstructions and MIPs were
obtained to evaluate the vascular anatomy. Carotid stenosis
measurements (when applicable) are obtained utilizing NASCET
criteria, using the distal internal carotid diameter as the
denominator.

[Series 11: ax thin · axial · 0.52mm/px · z∈[-333,-80]mm · 6 of 355 slices shown]
[im 51/355  soft-tissue]
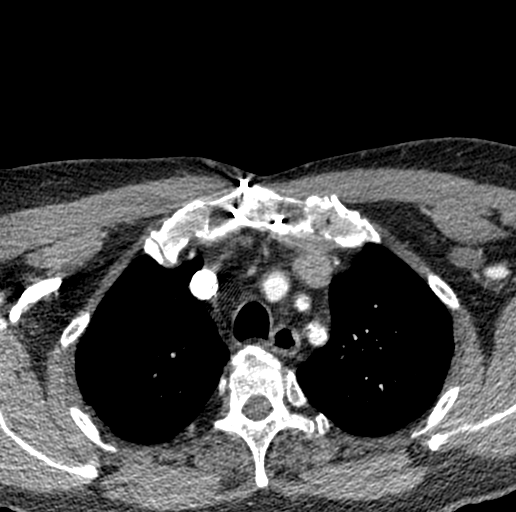
[im 102/355  bone]
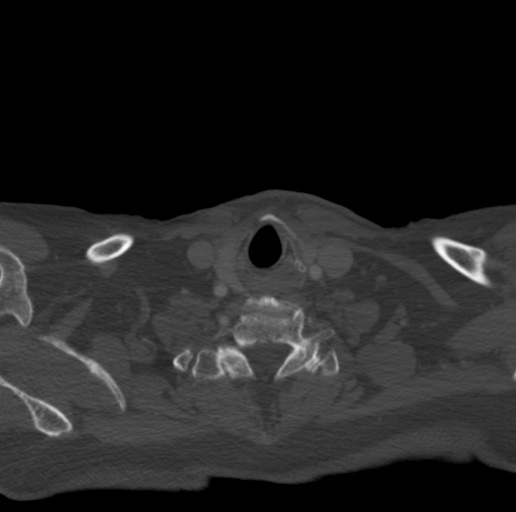
[im 152/355  soft-tissue]
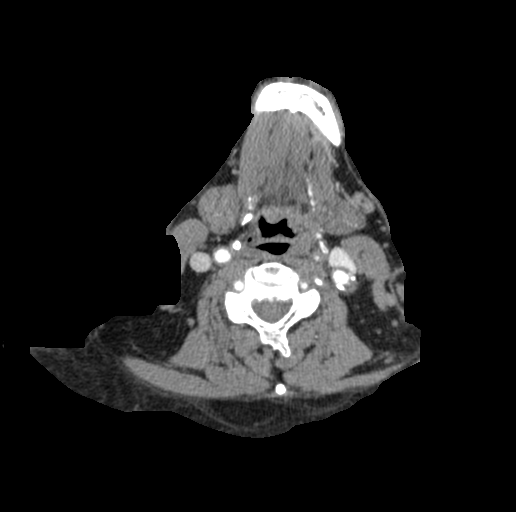
[im 203/355  bone]
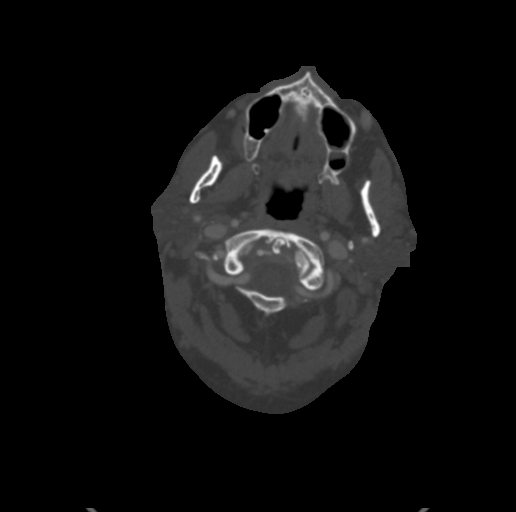
[im 253/355  soft-tissue]
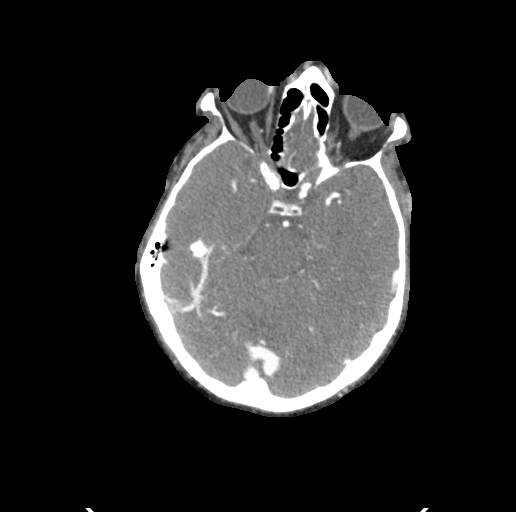
[im 304/355  bone]
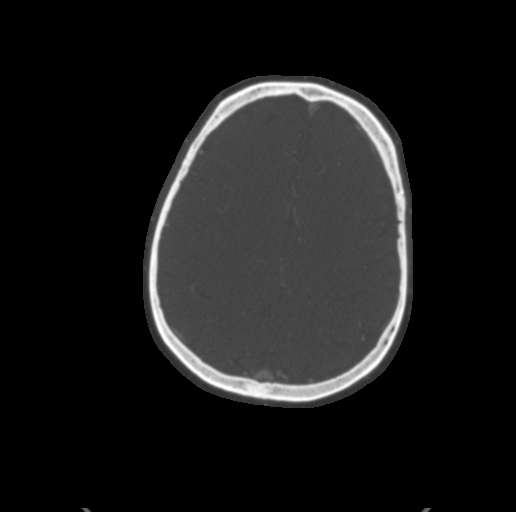

[6 of 33 positions shown; findings below may reference images not displayed]

RADIATION DOSE REDUCTION: This exam was performed according to the
departmental dose-optimization program which includes automated
exposure control, adjustment of the mA and/or kV according to
patient size and/or use of iterative reconstruction technique.

CONTRAST:  80mL OMNIPAQUE IOHEXOL 350 MG/ML SOLN
FINDINGS: CT HEAD FINDINGS

Brain: Possible hypodensity in the left hippocampus (series 5, image
12). No other hypodensity to suggest acute infarct. No acute
hemorrhage, mass, mass effect, or midline shift. No hydrocephalus or
extra-axial collection.

Vascular: Please see CTA findings below.

Skull: No acute osseous abnormality.

Sinuses: Air-fluid level in left maxillary sinus. Otherwise clear.
Status post bilateral lens replacements. Right globe scleral band.

Other: The mastoids are well aerated.

CTA NECK FINDINGS

Aortic arch: Standard branching. Imaged portion shows no evidence of
aneurysm or dissection. No significant stenosis of the major arch
vessel origins, although atherosclerotic calcifications extend into
the arch vessel origins.

Right carotid system: 60% stenosis in the right proximal internal
carotid artery and at the bifurcation, secondary to calcified and
noncalcified plaque. Approximately 50% narrowing at the origin of
the right external carotid artery. No dissection or occlusion.

Left carotid system: Calcified and noncalcified plaque at the
bifurcation and in the proximal left internal carotid artery, which
is not hemodynamically significant (less than 50% stenosis). No
dissection or occlusion.

Vertebral arteries: Codominant. No evidence of dissection, stenosis
(50% or greater) or occlusion.

Skeleton: Degenerative changes in the cervical spine with disc
height loss worst at C5-C6 and C7-T1. No acute osseous abnormality.

Other neck: Negative.

Upper chest: No focal pulmonary opacity or pleural effusion.
Paraseptal and centrilobular emphysema.

Review of the MIP images confirms the above findings

CTA HEAD FINDINGS

Anterior circulation:

Both internal carotid arteries are patent to the termini, without
stenosis or other abnormality.

A1 segments patent. Normal anterior communicating artery. Anterior
cerebral arteries are patent to their distal aspects.

No M1 stenosis or occlusion. Normal MCA bifurcations. Distal MCA
branches perfused and symmetric.

Posterior circulation:

Vertebral arteries patent to the vertebrobasilar junction, with mild
irregularity in the left V4 but no significant stenosis. Posterior
inferior cerebral arteries patent bilaterally.

Basilar patent to its distal aspect. Superior cerebellar arteries
patent bilaterally.

Patent proximal left P1, with severe stenosis in the distal left P1
(series 11, images 262-267), with focal reconstitution in the
proximal left P2, followed by complete occlusion. Possible distal
left P2 reconstitution, although evaluation is somewhat limited by
venous contamination. The right PCA is perfused from its origin
through its distal aspect without significant stenosis. The
bilateral posterior communicating arteries are not visualized.

Venous sinuses: As permitted by contrast timing, patent.

Anatomic variants: None significant

Review of the MIP images confirms the above findings
IMPRESSION: 1. Severe stenosis in the left P1 with complete occlusion of the
left P2. Evaluation for any reconstitution of the distal PCA is
limited by venous contamination; however, it is suspected that the
remainder of the PCA is non-opacified.
2. Small focus of hypodensity in the left posterior hippocampus.
Given the time course of the patient's symptoms, this may represent
a subacute infarct.
3. 60% stenosis of the proximal right ICA, with 50% stenosis of the
proximal right ECA.

These results were called by telephone at the time of interpretation
on 10/16/2021 at [DATE] to provider BRAYAN SOTERO ESCALANTE DUBON , who verbally
acknowledged these results.

## 2022-04-07 NOTE — Telephone Encounter (Signed)
Unable to reach by phone, sent mychart msg informing him of r/s needed for 9/19 appt- MD out.

## 2022-06-15 IMAGING — DX DG KNEE COMPLETE 4+V*R*
4 series · 4 of 4 positions shown · non-contrast
Comparison: None.

CLINICAL DATA: Anteromedial right knee pain for 4 days after having
a procedure were stent was placed and right side of neck.

EXAM:
RIGHT KNEE - COMPLETE 4+ VIEW

[knee ap]
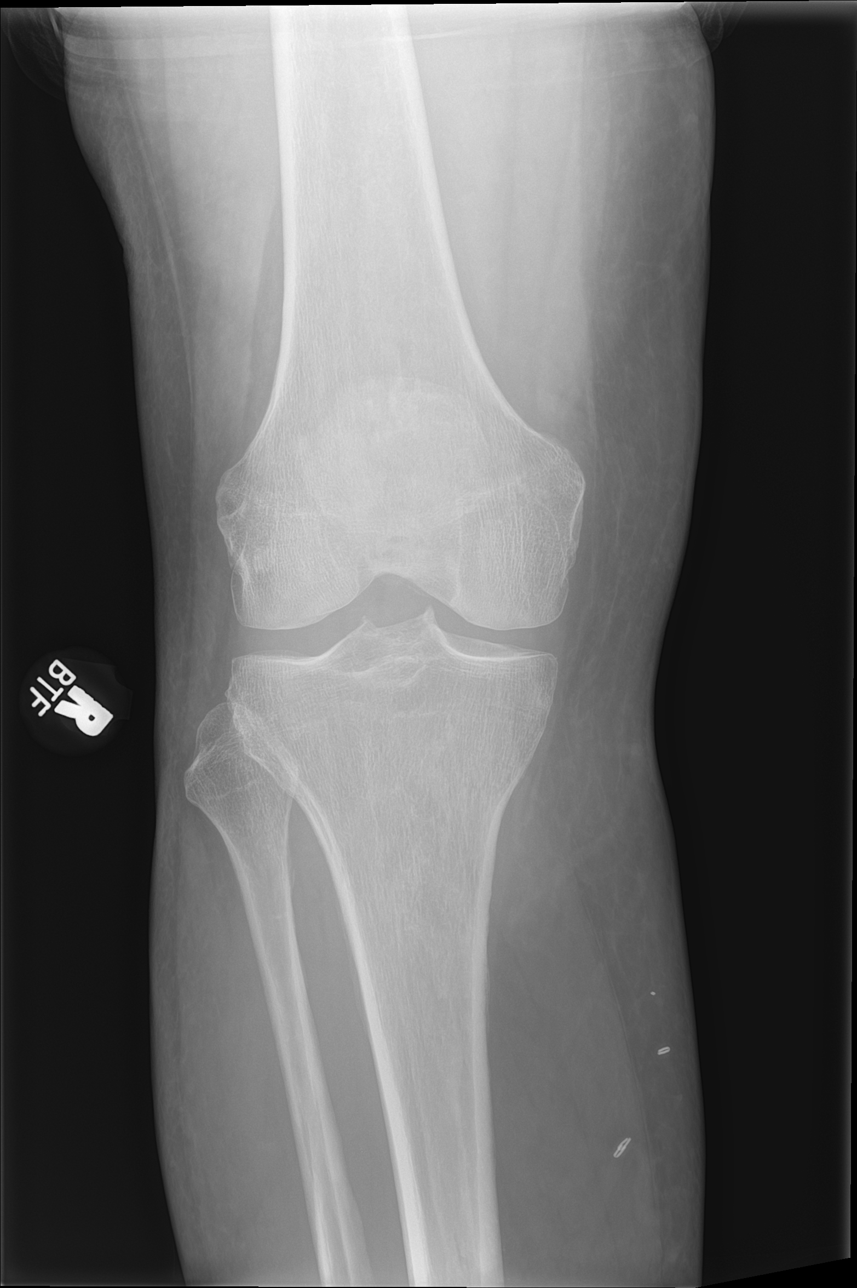

[knee obl (1 of 2)]
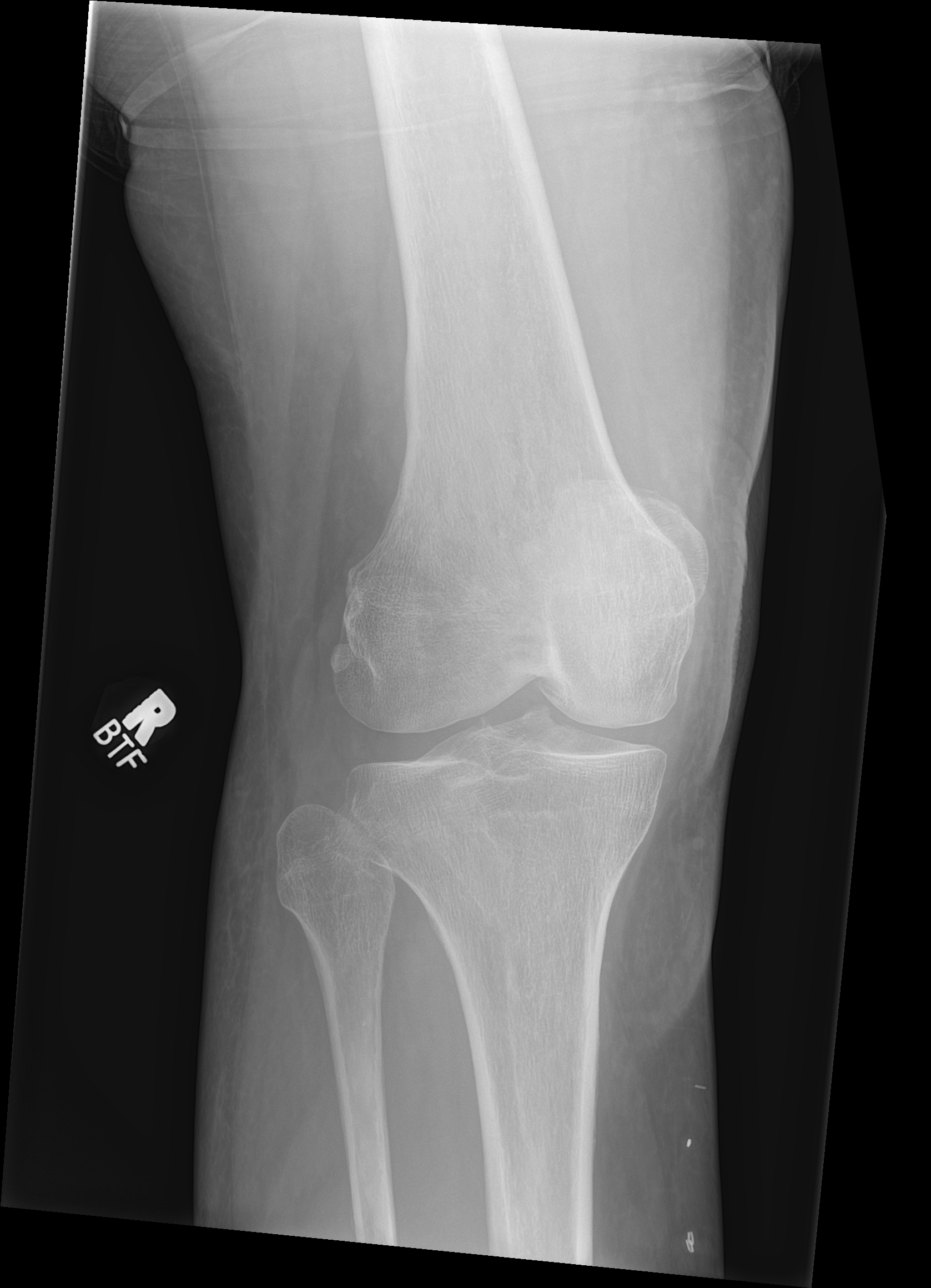

[knee obl (2 of 2)]
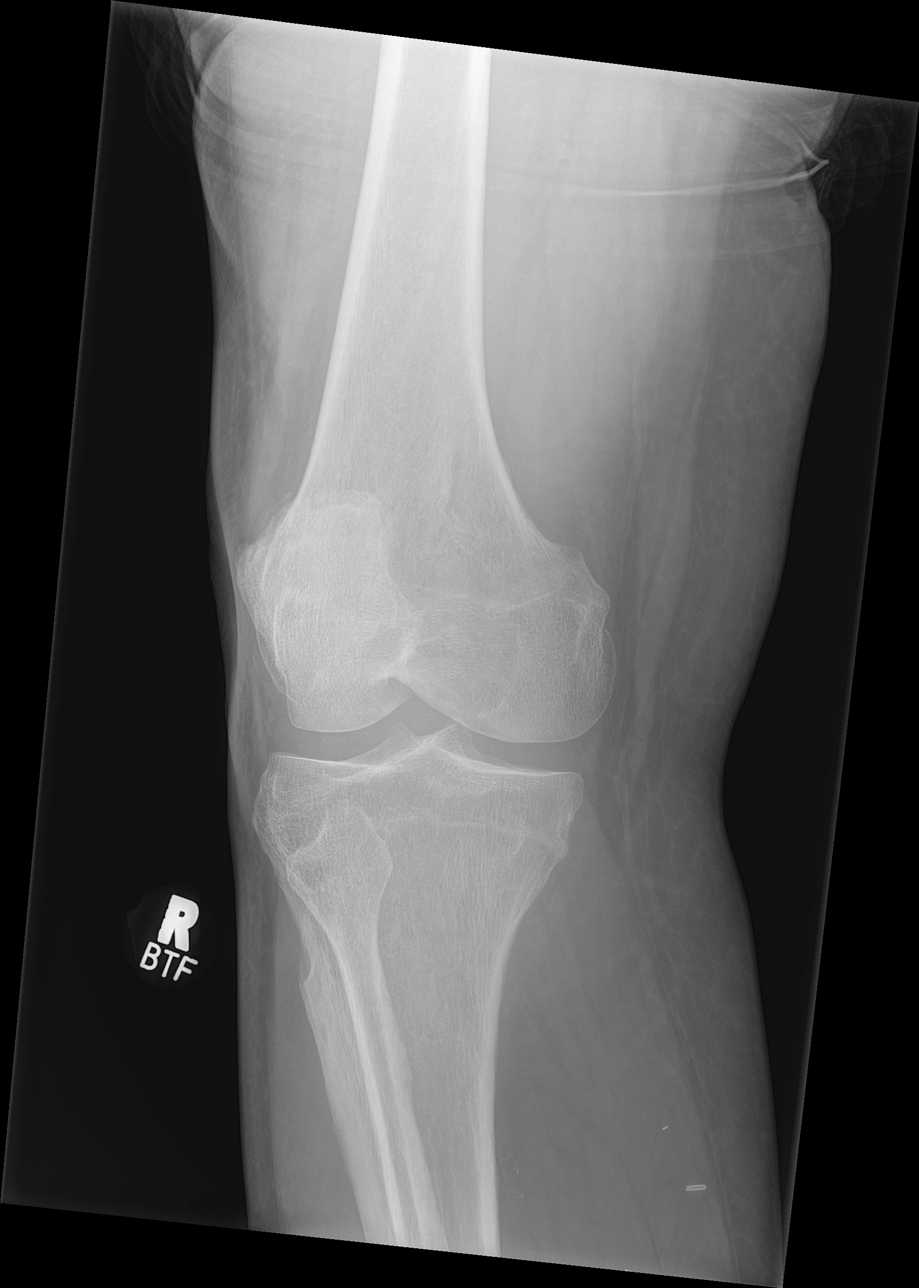

[knee lat]
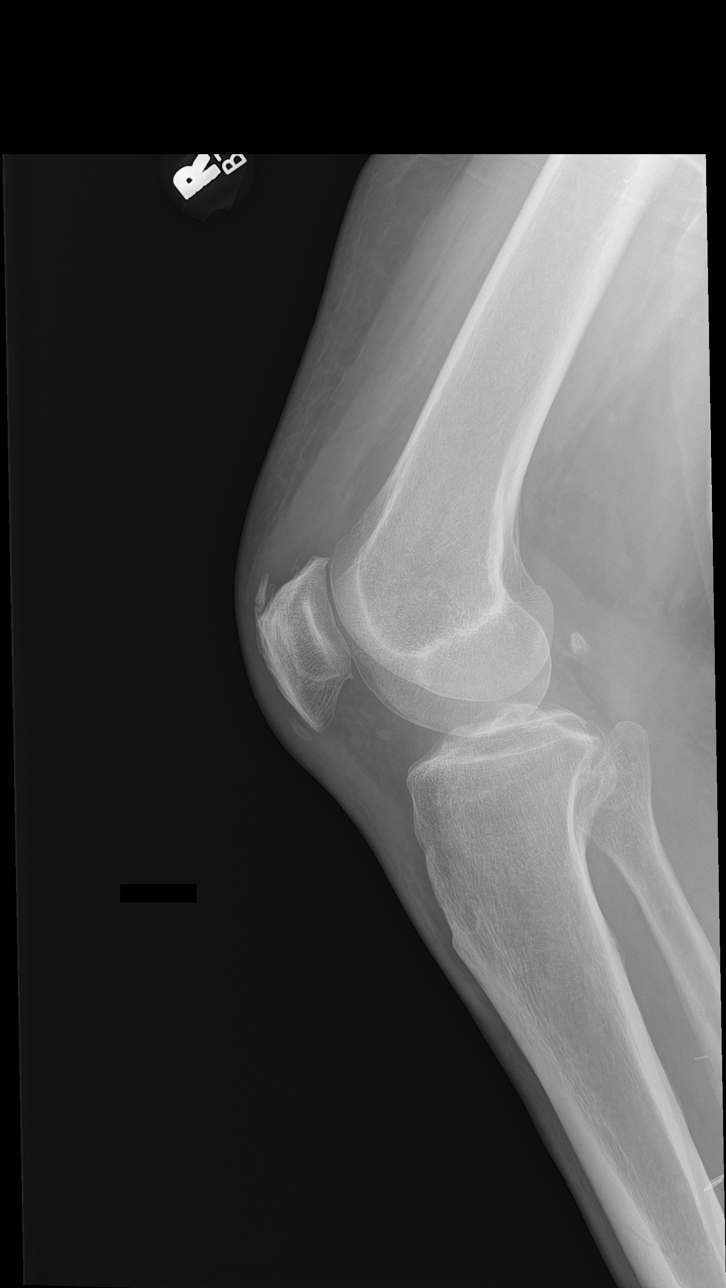

[4 of 4 positions shown; findings below may reference images not displayed]

FINDINGS: Surgical clips overlie the medial calf. No knee joint effusion. Mild
chronic enthesopathic change at the quadriceps insertion on the
patella. No acute fracture is seen. No dislocation.
IMPRESSION: No acute abnormality. Mild chronic enthesopathic change at the
quadriceps insertion on the patella.

## 2022-06-15 IMAGING — US US EXTREM LOW VENOUS*R*
1 series · 13 of 24 positions shown · non-contrast
Comparison: None.

CLINICAL DATA: Right lower leg pain and edema, discoloration



[Series 1: us venous img lower uni right (dvt) · portal-venous · 13 of 42 slices shown]
[im 1/42]
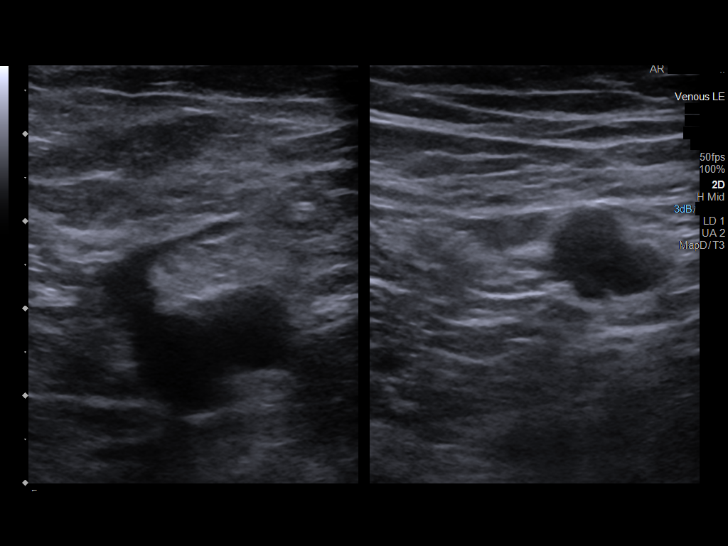
[im 4/42]
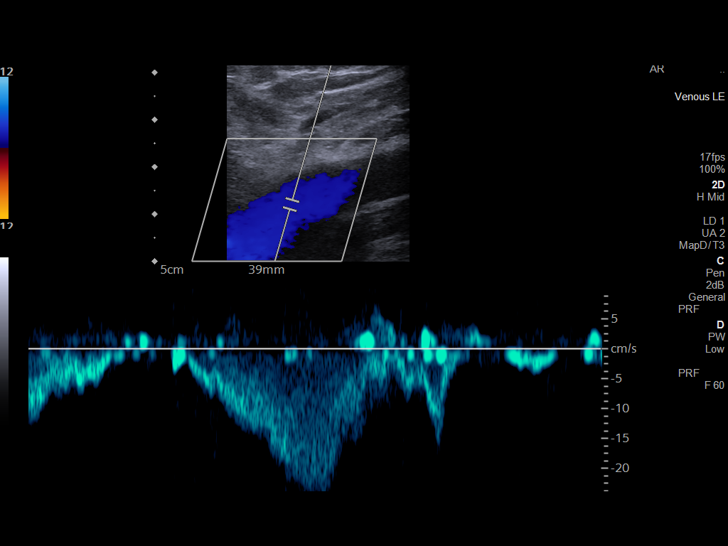
[im 8/42]
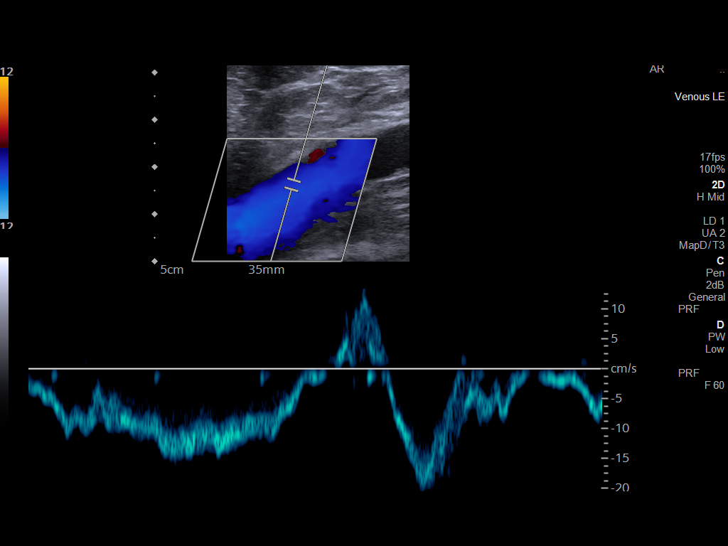
[im 11/42]
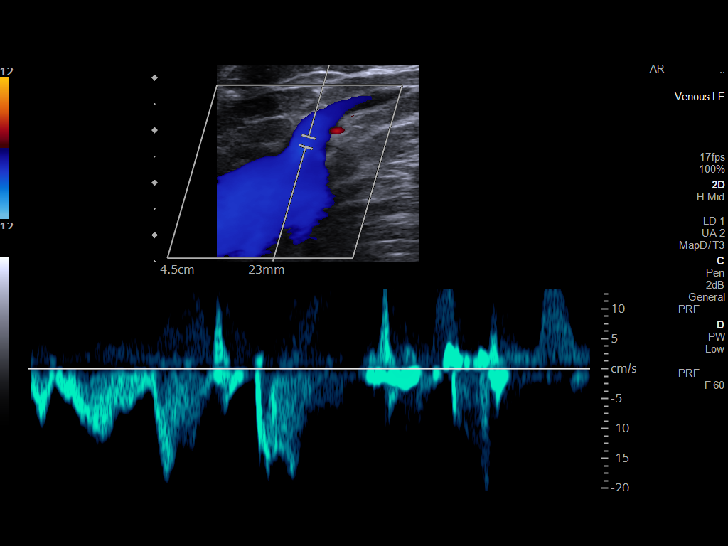
[im 15/42]
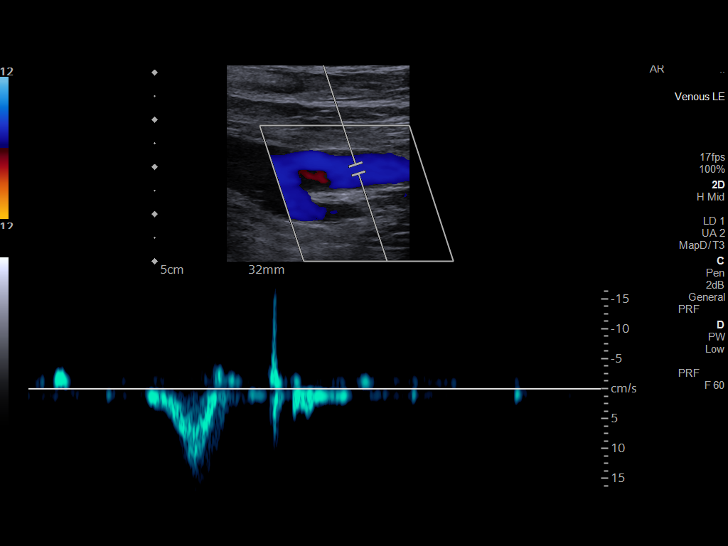
[im 18/42]
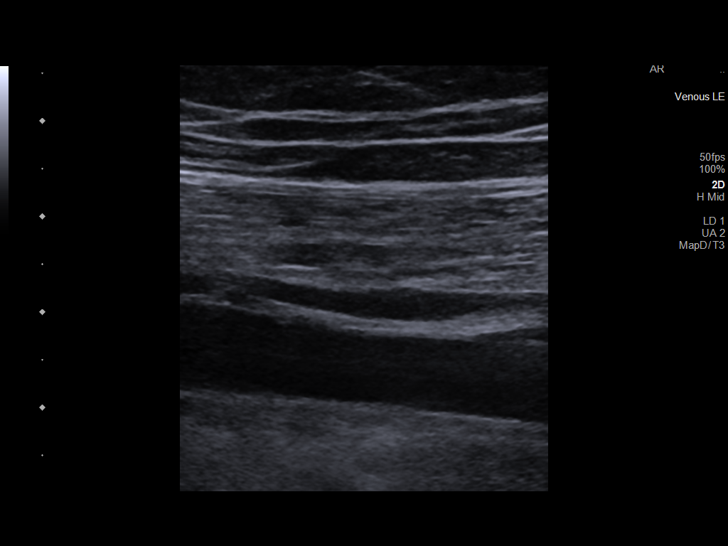
[im 22/42]
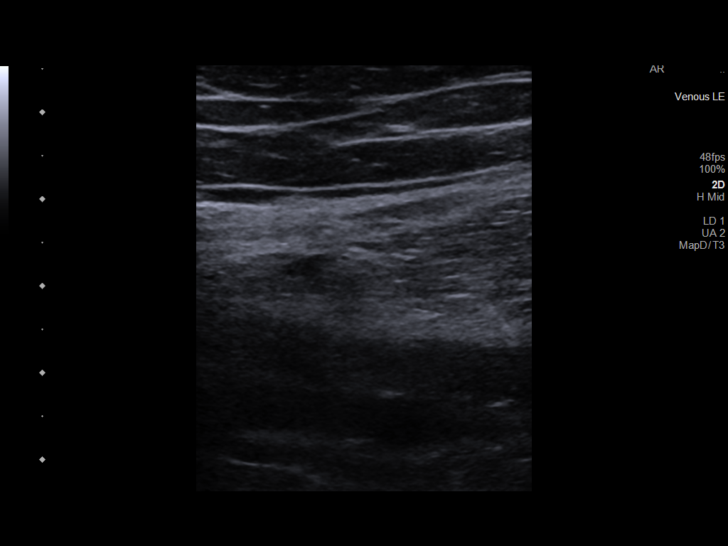
[im 24/42]
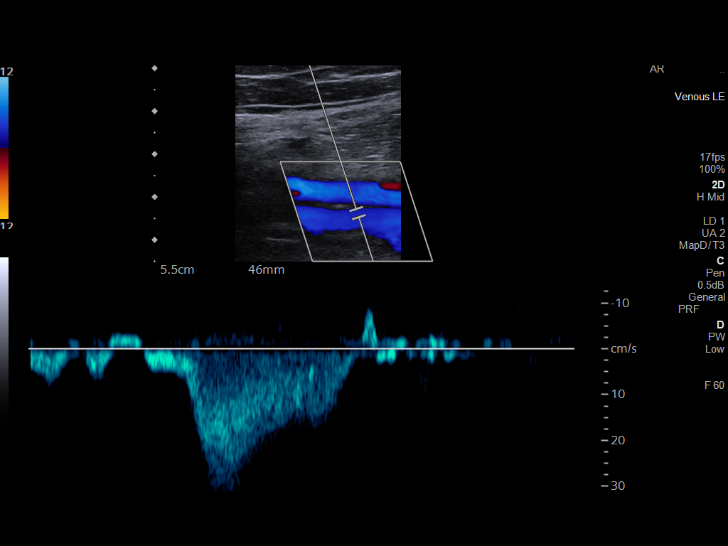
[im 27/42]
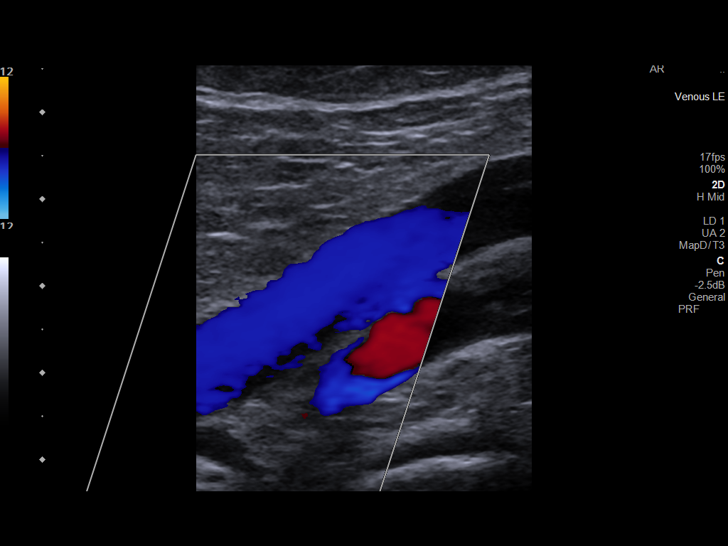
[im 31/42]
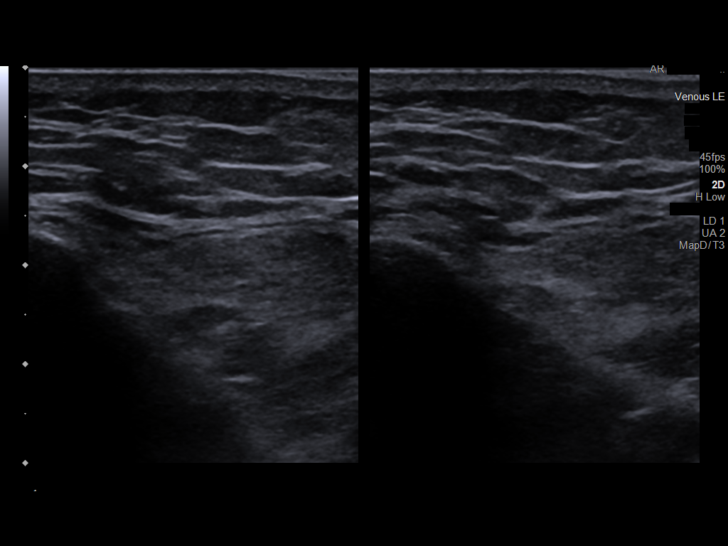
[im 34/42]
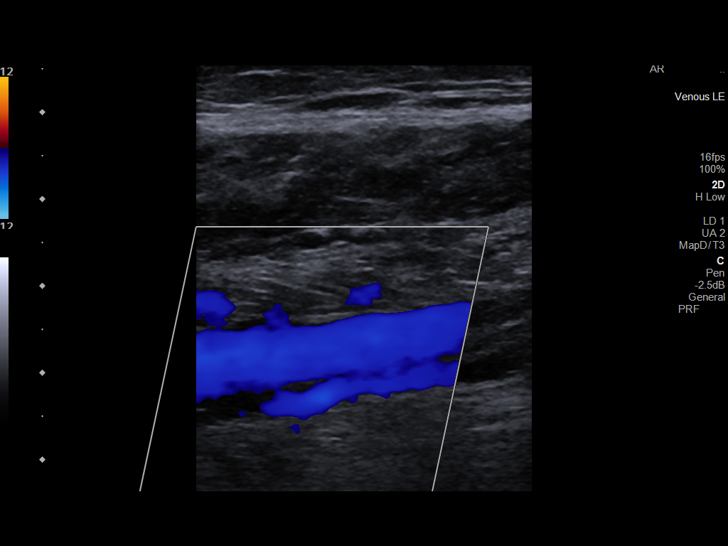
[im 38/42]
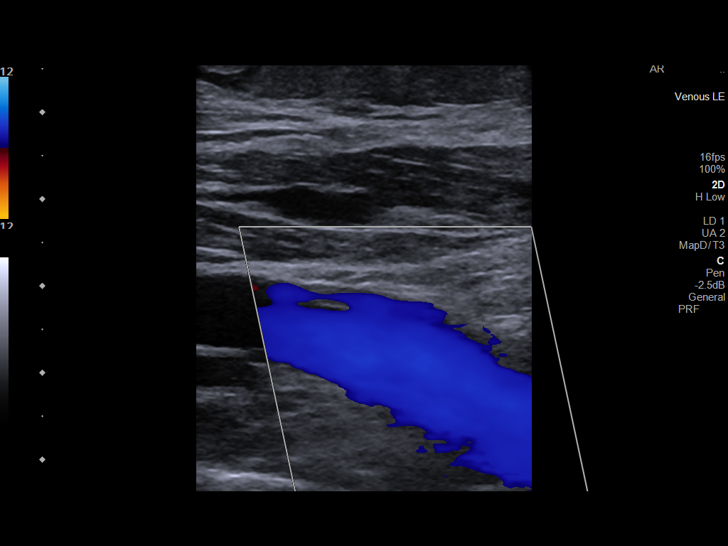
[im 42/42]
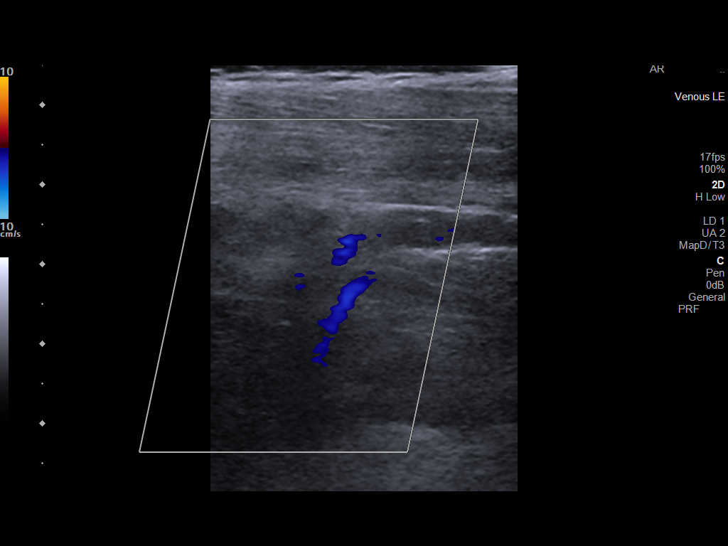

[13 of 24 positions shown; findings below may reference images not displayed]

FINDINGS: Contralateral Common Femoral Vein: Respiratory phasicity is normal
and symmetric with the symptomatic side. No evidence of thrombus.
Normal compressibility.

Common Femoral Vein: No evidence of thrombus. Normal
compressibility, respiratory phasicity and response to augmentation.

Saphenofemoral Junction: No evidence of thrombus. Normal
compressibility and flow on color Doppler imaging.

Profunda Femoral Vein: No evidence of thrombus. Normal
compressibility and flow on color Doppler imaging.

Femoral Vein: No evidence of thrombus. Normal compressibility,
respiratory phasicity and response to augmentation.

Popliteal Vein: No evidence of thrombus. Normal compressibility,
respiratory phasicity and response to augmentation.

Calf Veins: No evidence of thrombus. Normal compressibility and flow
on color Doppler imaging.
IMPRESSION: No evidence of deep venous thrombosis.

## 2022-06-24 ENCOUNTER — Ambulatory Visit: Payer: Medicare Other | Admitting: Neurology

## 2022-06-30 ENCOUNTER — Encounter (INDEPENDENT_AMBULATORY_CARE_PROVIDER_SITE_OTHER): Payer: Medicare Other | Admitting: Ophthalmology

## 2022-06-30 DIAGNOSIS — H35372 Puckering of macula, left eye: Secondary | ICD-10-CM | POA: Diagnosis not present

## 2022-06-30 DIAGNOSIS — H338 Other retinal detachments: Secondary | ICD-10-CM | POA: Diagnosis not present

## 2022-06-30 DIAGNOSIS — H353132 Nonexudative age-related macular degeneration, bilateral, intermediate dry stage: Secondary | ICD-10-CM

## 2022-06-30 DIAGNOSIS — H35033 Hypertensive retinopathy, bilateral: Secondary | ICD-10-CM | POA: Diagnosis not present

## 2022-06-30 DIAGNOSIS — H43813 Vitreous degeneration, bilateral: Secondary | ICD-10-CM

## 2022-06-30 DIAGNOSIS — I10 Essential (primary) hypertension: Secondary | ICD-10-CM | POA: Diagnosis not present

## 2022-07-16 DIAGNOSIS — Z8673 Personal history of transient ischemic attack (TIA), and cerebral infarction without residual deficits: Secondary | ICD-10-CM | POA: Diagnosis not present

## 2022-07-16 DIAGNOSIS — I10 Essential (primary) hypertension: Secondary | ICD-10-CM | POA: Diagnosis not present

## 2022-07-16 DIAGNOSIS — E7849 Other hyperlipidemia: Secondary | ICD-10-CM | POA: Diagnosis not present

## 2022-07-16 DIAGNOSIS — I25798 Atherosclerosis of other coronary artery bypass graft(s) with other forms of angina pectoris: Secondary | ICD-10-CM | POA: Diagnosis not present

## 2022-07-16 DIAGNOSIS — K219 Gastro-esophageal reflux disease without esophagitis: Secondary | ICD-10-CM | POA: Diagnosis not present

## 2022-08-05 ENCOUNTER — Ambulatory Visit: Payer: Medicare Other | Admitting: Neurology

## 2022-08-05 ENCOUNTER — Encounter: Payer: Self-pay | Admitting: Neurology

## 2022-08-05 VITALS — BP 152/76 | HR 54 | Ht 67.0 in | Wt 206.0 lb

## 2022-08-05 DIAGNOSIS — R200 Anesthesia of skin: Secondary | ICD-10-CM

## 2022-08-05 DIAGNOSIS — Z8673 Personal history of transient ischemic attack (TIA), and cerebral infarction without residual deficits: Secondary | ICD-10-CM | POA: Diagnosis not present

## 2022-08-05 DIAGNOSIS — G514 Facial myokymia: Secondary | ICD-10-CM | POA: Diagnosis not present

## 2022-08-05 NOTE — Progress Notes (Signed)
[name] [address] [place]. [place] [address] [phone]       OFFICE FOLLOW-UP VISIT NOTE  Scott Jacobson Date of Birth:  06/23/1951 Medical Record Number:  [id number]   Referring MD:  Gwenlyn Saran  Reason for Referral:  stroke and carotid stenosis  HPI: Initial visit 10/31/2021 Scott Jacobson is a 70 year old Caucasian male seen today for initial office consultation visit.  Is accompanied by his wife.  History is obtained from them and review of electronic medical records and reviewed pertinent available imaging films in PACS.  He has past medical history of hypertension, myocardial infarction, coronary artery disease, prostatic hypertrophy, arthritis and kidney stones.  Patient states on 10/01/2021 he developed sudden onset of numbness and his left face.  He states this involves mostly his cheeks and lips up to the midline.  It also involves his temples to mild degree but is not sure if it involves his forehead or eyelids.  He also noticed some ringing sound in his left ear.  He denied any facial weakness, slurred speech, extremity weakness or numbness.  A few days prior he had noticed some numbness in his left hand which she did not stay long.  He denies any focal extremity weakness, gait or balance problems, headache, loss of vision, double vision or vertigo.  He does have a remote history of TIA about 20 years ago when he had some speech difficulties for a few days.  He was seen in the ER and sent home with placed on aspirin.  He had carotid ultrasound done as an outpatient which showed 79 9% right ICA stenosis with heterogeneous calcified plaque as well as 50% left ICA stenosis.  He was seen by Dr. Unk Lightning who is a vascular surgeon who felt his symptoms of left facial numbness did not go along with right carotid stenosis and sent the patient to me for my opinion.  Patient underwent CT angiogram of the brain and neck on 10/16/2021 which showed 60% right ICA stenosis and  50% left ICA stenosis.  Left P2 posterior cerebral artery was occluded with CT scan showing area of vague hypodensity in the left hippocampus possibly subacute infarct.  Patient denies any symptoms of peripheral vision loss, short-term memory, gait or balance.  He was on aspirin and recently Plavix has been added as tolerating both medications well without bruising or bleeding.  His blood pressure is under good control and today it is 142/68.  He has been started on Lipitor 40 mg which is tolerating well without muscle aches or pains.  He denies any definitive symptoms of right hemispheric ischemia but MRI scan of the brain done on 10/16/2021 shows no acute infarcts but shows subtle FLAIR and T2 parietal and occipital cortical hyperintensities possibly subacute infarcts more on the right than the left.  Patient denies any history of atrial fibrillation, palpitations, syncope or passing out. Update 08/05/2022 : He returns for follow-up after last visit 9 months ago.  Patient states he is doing well and has not had any stroke or TIA symptoms.  He remains on aspirin and Plavix which is tolerating well but does have easy bruising though no bleeding.  His blood pressure is well controlled at home but it is slightly elevated in my office today at 152/76.  He is very active and independent in activities of daily living and plays regularly.  He had carotid ultrasound done on 01/17/2022 and vascular surgery office which showed no significant restenosis in the right  carotid stent.  30-day heart monitoring from 11/07/2021 through 12/11/2021 showed no evidence of atrial fibrillation.  LDL cholesterol on 12/20/2021 was 46 mg percent.  2D echo on 02/25/2022 shows ejection fraction of 55 to 60% and mild mitral regurg.  Patient still has some residual numbness on his left face that is occasional facial twitching's which he is aware of do not seem to bother anybody else.. ROS:   14 system review of systems is positive for facial  numbness, ringing in the ears, decreased hearing and all other systems negative  PMH:  Past Medical History:  Diagnosis Date   Anginal pain ([place])    Arthritis    "probably"   BPH (benign prostatic hyperplasia)    Carotid artery occlusion    Coronary artery disease    Dyspnea    GERD (gastroesophageal reflux disease)    History of kidney stones    Hypertension    Kidney stones    Macular degeneration    Myocardial infarction ([place])    Seasonal allergies     Social History:  Social History   Socioeconomic History   Marital status: Married    Spouse name: Not on file   Number of children: Not on file   Years of education: 12   Highest education level: Not on file  Occupational History   Not on file  Tobacco Use   Smoking status: Former    Packs/day: 3.00    Years: 16.00    Total pack years: 48.00    Types: Cigarettes    Quit date: 02/04/1979    Years since quitting: 43.5   Smokeless tobacco: Never  Vaping Use   Vaping Use: Never used  Substance and Sexual Activity   Alcohol use: Yes    Comment: occassionally   Drug use: No   Sexual activity: Not Currently  Other Topics Concern   Not on file  Social History Narrative   Lives at home with his wife.   Step-children.   Right-handed.   3 cups caffeine per day.   Social Determinants of Health   Financial Resource Strain: Not on file  Food Insecurity: Not on file  Transportation Needs: Not on file  Physical Activity: Not on file  Stress: Not on file  Social Connections: Not on file  Intimate Partner Violence: Not on file    Medications:   Current Outpatient Medications on File Prior to Visit  Medication Sig Dispense Refill   atorvastatin (LIPITOR) 40 MG tablet TAKE 1 TABLET BY MOUTH EVERY DAY 90 tablet 2   clopidogrel (PLAVIX) 75 MG tablet Take 75 mg by mouth daily.     loratadine (CLARITIN) 10 MG tablet Take 10 mg by mouth every evening.     metoprolol tartrate (LOPRESSOR) 25 MG tablet Take 1 tablet (25 mg  total) by mouth 2 (two) times daily. 60 tablet 11   Multiple Vitamin (MULTIVITAMIN WITH MINERALS) TABS tablet Take 1 tablet by mouth daily.     Multiple Vitamins-Minerals (PRESERVISION AREDS 2 PO) Take 1 capsule by mouth in the morning and at bedtime.     Omega-3 Fatty Acids (FISH OIL) 1000 MG CAPS Take 1,000-2,000 mg by mouth See admin instructions. Take 2000 mg by mouth in the evening and 1000 mg at bedtime     pantoprazole (PROTONIX) 40 MG tablet Take 40 mg by mouth daily before breakfast.     Polyvinyl Alcohol-Povidone (REFRESH OP) Place 1 drop into both eyes in the morning.     ramipril (ALTACE)  5 MG capsule Take 5 mg by mouth in the morning.     tamsulosin (FLOMAX) 0.4 MG CAPS capsule Take 0.4 mg by mouth in the morning.     No current facility-administered medications on file prior to visit.    Allergies:  No Known Allergies  Physical Exam General: well developed, well nourished elderly Caucasian male, seated, in no evident distress Head: head normocephalic and atraumatic.   Neck: supple with no carotid or supraclavicular bruits Cardiovascular: regular rate and rhythm, no murmurs Musculoskeletal: no deformity Skin:  no rash/petichiae Vascular:  Normal pulses all extremities  Neurologic Exam Mental Status: Awake and fully alert. Oriented to place and time. Recent and remote memory intact. Attention span, concentration and fund of knowledge appropriate. Mood and affect appropriate.  Cranial Nerves: Fundoscopic exam not done. Pupils equal, briskly reactive to light. Extraocular movements full without nystagmus. Visual fields full to confrontation. Hearing slightly diminished on the left.  Air conduction patient is still greater than bone conduction.. Facial sensation Left lower face t. Face, tongue, palate moves normally and symmetrically.  Subjective left cheek numbness. Motor: Normal bulk and tone. Normal strength in all tested extremity muscles.  Occasional minor bruising from the  left lower half of the face. Sensory.: intact to touch , pinprick , position and vibratory sensation.  Coordination: Rapid alternating movements normal in all extremities. Finger-to-nose and heel-to-shin performed accurately bilaterally. Gait and Station: Arises from chair without difficulty. Stance is normal. Gait demonstrates normal stride length and balance . Able to heel, toe and tandem walk with mild difficulty.  Reflexes: 1+ and symmetric. Toes downgoing.   NIHSS  1 Modified Rankin  2   ASSESSMENT: 71 year old Caucasian male with sudden onset of left facial numbness and some hearing loss possibly from small brainstem infarct not visualized on MRI as it was done 2 weeks later likely small vessel disease but abnormal neurovascular studies suggesting moderate right carotid, mild left carotid and severe intracranial left PCA atherosclerotic disease.  Vascular risk factors of hyperlipidemia and hypertension and intra and extracranial atherosclerosis.  Abnormal MRI scan showing bilateral small cortical parieto-occipital silent infarcts     PLAN: I had a long d/w patient about his recent stroke, risk for recurrent stroke/TIAs, personally independently reviewed imaging studies and stroke evaluation results and answered questions.Continue Plavix 75 mg daily alone as it has been more than 6 months since his TCAR procedure for secondary stroke prevention and maintain strict control of hypertension with blood pressure goal below 130/90, diabetes with hemoglobin A1c goal below 6.5% and lipids with LDL cholesterol goal below 70 mg/dL. I also advised the patient to eat a healthy diet with plenty of whole grains, cereals, fruits and vegetables, exercise regularly and maintain ideal body weight Followup in the future with me  only if needed and no scheduled follow-up appointment was made.Greater than 50% time during this 35-minute  visit was spent on counseling and coordination of care about his numbness and  carotid stenosis and discussion about stroke risk, prevention and treatment and answering questions. Antony Contras, MD  Note: This document was prepared with digital dictation and possible smart phrase technology. Any transcriptional errors that result from this process are unintentional.

## 2022-08-05 NOTE — Patient Instructions (Signed)
I had a long d/w patient about his recent stroke, risk for recurrent stroke/TIAs, personally independently reviewed imaging studies and stroke evaluation results and answered questions.Continue Plavix 75 mg daily alone as it has been more than 6 months since his TCAR procedure for secondary stroke prevention and maintain strict control of hypertension with blood pressure goal below 130/90, diabetes with hemoglobin A1c goal below 6.5% and lipids with LDL cholesterol goal below 70 mg/dL. I also advised the patient to eat a healthy diet with plenty of whole grains, cereals, fruits and vegetables, exercise regularly and maintain ideal body weight Followup in the future with me i only if needed and no scheduled follow-up appointment was made.

## 2022-10-01 DIAGNOSIS — L814 Other melanin hyperpigmentation: Secondary | ICD-10-CM | POA: Diagnosis not present

## 2022-10-01 DIAGNOSIS — D3701 Neoplasm of uncertain behavior of lip: Secondary | ICD-10-CM | POA: Diagnosis not present

## 2022-10-01 DIAGNOSIS — D225 Melanocytic nevi of trunk: Secondary | ICD-10-CM | POA: Diagnosis not present

## 2022-10-01 DIAGNOSIS — L570 Actinic keratosis: Secondary | ICD-10-CM | POA: Diagnosis not present

## 2022-10-01 DIAGNOSIS — L821 Other seborrheic keratosis: Secondary | ICD-10-CM | POA: Diagnosis not present

## 2022-11-17 ENCOUNTER — Telehealth: Payer: Self-pay

## 2022-11-17 DIAGNOSIS — I10 Essential (primary) hypertension: Secondary | ICD-10-CM | POA: Diagnosis not present

## 2022-11-17 DIAGNOSIS — Z0000 Encounter for general adult medical examination without abnormal findings: Secondary | ICD-10-CM | POA: Diagnosis not present

## 2022-11-17 DIAGNOSIS — Z8673 Personal history of transient ischemic attack (TIA), and cerebral infarction without residual deficits: Secondary | ICD-10-CM | POA: Diagnosis not present

## 2022-11-17 DIAGNOSIS — Z95828 Presence of other vascular implants and grafts: Secondary | ICD-10-CM

## 2022-11-17 DIAGNOSIS — E7849 Other hyperlipidemia: Secondary | ICD-10-CM | POA: Diagnosis not present

## 2022-11-17 DIAGNOSIS — I25798 Atherosclerosis of other coronary artery bypass graft(s) with other forms of angina pectoris: Secondary | ICD-10-CM | POA: Diagnosis not present

## 2022-11-17 DIAGNOSIS — K219 Gastro-esophageal reflux disease without esophagitis: Secondary | ICD-10-CM | POA: Diagnosis not present

## 2022-11-17 DIAGNOSIS — I6523 Occlusion and stenosis of bilateral carotid arteries: Secondary | ICD-10-CM

## 2022-11-17 NOTE — Telephone Encounter (Signed)
Pt called stating that his PCP recommended that he f/u with Dr. Virl Cagey again d/t his facial numbness to ensure that his stents were still open.  Reviewed pt's chart, returned call for clarification, two identifiers used. Pt states that his facial numbness is intermittent and he has not had any other stroke-like symptoms. Recall for 9 mos due in 1/24, so pt scheduled for appts from recall. Pt lives in [place], so the [place] appt was scheduled at that office with pt approval to keep driving and wait time to a minimum. PA appt scheduled in [place] for 2/16. Confirmed understanding.

## 2022-11-18 DIAGNOSIS — H524 Presbyopia: Secondary | ICD-10-CM | POA: Diagnosis not present

## 2022-11-18 DIAGNOSIS — M25561 Pain in right knee: Secondary | ICD-10-CM | POA: Diagnosis not present

## 2022-11-18 DIAGNOSIS — M25461 Effusion, right knee: Secondary | ICD-10-CM | POA: Diagnosis not present

## 2022-11-18 DIAGNOSIS — M1711 Unilateral primary osteoarthritis, right knee: Secondary | ICD-10-CM | POA: Diagnosis not present

## 2022-11-18 DIAGNOSIS — H40013 Open angle with borderline findings, low risk, bilateral: Secondary | ICD-10-CM | POA: Diagnosis not present

## 2022-11-19 ENCOUNTER — Ambulatory Visit (INDEPENDENT_AMBULATORY_CARE_PROVIDER_SITE_OTHER): Payer: Medicare Other

## 2022-11-19 DIAGNOSIS — I6523 Occlusion and stenosis of bilateral carotid arteries: Secondary | ICD-10-CM

## 2022-11-19 DIAGNOSIS — Z95828 Presence of other vascular implants and grafts: Secondary | ICD-10-CM

## 2022-11-20 NOTE — Progress Notes (Unsigned)
Office Note   HPI: Scott Jacobson is a 72 y.o. (Feb 02, 1951) male presenting in follow-up status post 12/19/21 right TCAR for symptomatic ICA stenosis.  Last visit, Scott Jacobson was doing well, with no issues.  Last Sunday he appreciated numbness on the left side of his face with which radiated over to the right side of his face.  He has had a few episodes of this.  He denies other sensorimotor deficits.  When smiling, Scott Jacobson has always had a lip quiver involving the left nasolabial fold.  This is still present.  The pt is  on a statin for cholesterol management.  The pt is  on a daily aspirin.   Other AC:  plavix The pt is on medication for hypertension.   The pt is not diabetic.  Tobacco hx:  former  Past Medical History:  Diagnosis Date   Anginal pain (Scott Jacobson)    Arthritis    "probably"   BPH (benign prostatic hyperplasia)    Carotid artery occlusion    Coronary artery disease    Dyspnea    GERD (gastroesophageal reflux disease)    History of kidney stones    Hypertension    Kidney stones    Macular degeneration    Myocardial infarction (Scott Jacobson)    Seasonal allergies     Past Surgical History:  Procedure Laterality Date   APPENDECTOMY     BACK SURGERY     CARDIAC CATHETERIZATION N/A 08/21/2016   Procedure: Left Heart Cath and Coronary Angiography;  Surgeon: Burnell Blanks, MD;  Location: [facility];  Service: Cardiovascular;  Laterality: N/A;   CARDIAC CATHETERIZATION N/A 08/21/2016   Procedure: Coronary Stent Intervention;  Surgeon: Burnell Blanks, MD;  Location: [facility];  Service: Cardiovascular;  Laterality: N/A;   CARDIAC SURGERY     quadruple bypass   CATARACT EXTRACTION W/ INTRAOCULAR LENS  IMPLANT, BILATERAL Bilateral    COLONOSCOPY     COLONOSCOPY WITH PROPOFOL N/A 03/05/2021   Procedure: COLONOSCOPY WITH PROPOFOL;  Surgeon: Harvel Quale, MD;  [facility];  Service: Gastroenterology;  Laterality: N/A;  11:00   CORONARY  ARTERY BYPASS GRAFT N/A 10/03/2016   Procedure: CORONARY ARTERY BYPASS GRAFTING (CABG)x 4 with endoscopic harvesting of right saphenous vein ,LIMA-LAD SVG-DIAG SVG-OM SVG-RCA;  Surgeon: Gaye Pollack, MD;  Location: [facility];  Service: Open Heart Surgery;  Laterality: N/A;   EYE SURGERY     GAS INSERTION Right 02/05/2016   Procedure: INSERTION OF GAS-C3F8;  Surgeon: Hayden Pedro, MD;  Location: [facility];  Service: Ophthalmology;  Laterality: Right;   HAND SURGERY Right    thumb; S/P "cut his arm"   LASER PHOTO ABLATION Right 02/05/2016   Procedure: LASER PHOTO ABLATION-HEADSCOPE LASER;  Surgeon: Hayden Pedro, MD;  Location: [facility];  Service: Ophthalmology;  Laterality: Right;   LUMBAR DISC SURGERY  ~ 2002   POLYPECTOMY  03/05/2021   Procedure: POLYPECTOMY;  Surgeon: Montez Morita, Quillian Quince, MD;  [facility];  Service: Gastroenterology;;  ascending colon, sigmoid colon   RETINAL DETACHMENT REPAIR W/ SCLERAL BUCKLE LE Right 02/05/2016   SCLERAL BUCKLE Right 02/05/2016   Procedure: SCLERAL BUCKLE;  Surgeon: Hayden Pedro, MD;  Location: [facility];  Service: Ophthalmology;  Laterality: Right;   TEE WITHOUT CARDIOVERSION N/A 10/03/2016   Procedure: TRANSESOPHAGEAL ECHOCARDIOGRAM (TEE);  Surgeon: Gaye Pollack, MD;  Location: [facility];  Service: Open Heart Surgery;  Laterality: N/A;   TONSILLECTOMY  TRANSCAROTID ARTERY REVASCULARIZATION  Right 12/19/2021   Procedure: Right Transcarotid Artery Revascularization;  Surgeon: Broadus Santana, MD;  Location: [facility];  Service: Vascular;  Laterality: Right;    Social History   Socioeconomic History   Marital status: Married    Spouse name: Not on file   Number of children: Not on file   Years of education: 12   Highest education level: Not on file  Occupational History   Not on file  Tobacco Use   Smoking status: Former    Packs/day: 3.00    Years: 16.00    Total pack years: 48.00    Types: Cigarettes    Quit date: 02/04/1979    Years  since quitting: 43.8   Smokeless tobacco: Never  Vaping Use   Vaping Use: Never used  Substance and Sexual Activity   Alcohol use: Yes    Comment: occassionally   Drug use: No   Sexual activity: Not Currently  Other Topics Concern   Not on file  Social History Narrative   Lives at home with his wife.   Step-children.   Right-handed.   3 cups caffeine per day.   Social Determinants of Health   Financial Resource Strain: Not on file  Food Insecurity: Not on file  Transportation Needs: Not on file  Physical Activity: Not on file  Stress: Not on file  Social Connections: Not on file  Intimate Partner Violence: Not on file    Family History  Problem Relation Age of Onset   Hypertension Mother    Congestive Heart Failure Father     Current Outpatient Medications  Medication Sig Dispense Refill   atorvastatin (LIPITOR) 40 MG tablet TAKE 1 TABLET BY MOUTH EVERY DAY 90 tablet 2   clopidogrel (PLAVIX) 75 MG tablet Take 75 mg by mouth daily.     loratadine (CLARITIN) 10 MG tablet Take 10 mg by mouth every evening.     metoprolol tartrate (LOPRESSOR) 25 MG tablet Take 1 tablet (25 mg total) by mouth 2 (two) times daily. 60 tablet 11   Multiple Vitamin (MULTIVITAMIN WITH MINERALS) TABS tablet Take 1 tablet by mouth daily.     Multiple Vitamins-Minerals (PRESERVISION AREDS 2 PO) Take 1 capsule by mouth in the morning and at bedtime.     Omega-3 Fatty Acids (FISH OIL) 1000 MG CAPS Take 1,000-2,000 mg by mouth See admin instructions. Take 2000 mg by mouth in the evening and 1000 mg at bedtime     pantoprazole (PROTONIX) 40 MG tablet Take 40 mg by mouth daily before breakfast.     Polyvinyl Alcohol-Povidone (REFRESH OP) Place 1 drop into both eyes in the morning.     ramipril (ALTACE) 5 MG capsule Take 5 mg by mouth in the morning.     tamsulosin (FLOMAX) 0.4 MG CAPS capsule Take 0.4 mg by mouth in the morning.     No current facility-administered medications for this visit.    No  Known Allergies   REVIEW OF SYSTEMS:   [X]$  denotes positive finding, [ ]$  denotes negative finding Cardiac  Comments:  Chest pain or chest pressure:    Shortness of breath upon exertion:    Short of breath when lying flat:    Irregular heart rhythm:        Vascular    Pain in calf, thigh, or hip brought on by ambulation:    Pain in feet at night that wakes you up from your sleep:     Blood clot  in your veins:    Leg swelling:         Pulmonary    Oxygen at home:    Productive cough:     Wheezing:         Neurologic    Sudden weakness in arms or legs:     Sudden numbness in arms or legs:     Sudden onset of difficulty speaking or slurred speech:    Temporary loss of vision in one eye:     Problems with dizziness:         Gastrointestinal    Blood in stool:     Vomited blood:         Genitourinary    Burning when urinating:     Blood in urine:        Psychiatric    Major depression:         Hematologic    Bleeding problems:    Problems with blood clotting too easily:        Skin    Rashes or ulcers:        Constitutional    Fever or chills:      PHYSICAL EXAMINATION:  There were no vitals filed for this visit.   General:  WDWN in NAD; vital signs documented above Gait: Not observed HENT: WNL, normocephalic, some deficits at the nasolabial fold on the left Pulmonary: normal non-labored breathing , without wheezing Cardiac: regular HR, Abdomen: soft, NT, no masses Skin: without rashes Vascular Exam/Pulses:  Right Left  Radial 2+ (normal) 2+ (normal)  Ulnar 2+ (normal) 2+ (normal)  Femoral    Popliteal    DP 2+ (normal) 2+ (normal)  PT 2+ (normal) 2+ (normal)   Extremities: without ischemic changes, without Gangrene , without cellulitis; without open wounds;  Musculoskeletal: no muscle wasting or atrophy  Neurologic: A&O X 3;  No focal weakness or paresthesias are detected Psychiatric:  The pt has Normal affect.   Non-Invasive Vascular Imaging:     Right Carotid Findings:  +----------+--------+--------+--------+-----------------+------------------  ----+           PSV cm/sEDV cm/sStenosisPlaque           Comments                                                   Description                               +----------+--------+--------+--------+-----------------+------------------  ----+  CCA Prox  79      15                                                        +----------+--------+--------+--------+-----------------+------------------  ----+  CCA Mid   62      15                                                        +----------+--------+--------+--------+-----------------+------------------  ----+  CCA  Distal62      20              heterogenous     stent inflow             +----------+--------+--------+--------+-----------------+------------------  ----+  ICA Prox                                           stent                    +----------+--------+--------+--------+-----------------+------------------  ----+  ICA Mid   82      29                               stent outflow            +----------+--------+--------+--------+-----------------+------------------  ----+  ICA Distal61      19                                                        +----------+--------+--------+--------+-----------------+------------------  ----+  ECA      284     47                               narrowing due to  stent  +----------+--------+--------+--------+-----------------+------------------  ----+   +----------+--------+-------+----------------+-------------------+           PSV cm/sEDV cmsDescribe        Arm Pressure (mmHG)  +----------+--------+-------+----------------+-------------------+  Subclavian110    0      Multiphasic, WNL130                  +----------+--------+-------+----------------+-------------------+    +---------+--------+--+--------+--+---------+  VertebralPSV cm/s74EDV cm/s14Antegrade  +---------+--------+--+--------+--+---------+      Right Stent(s):  +---------------+--------+--------+--------+--------+--------+  ICA           PSV cm/sEDV cm/sStenosisWaveformComments  +---------------+--------+--------+--------+--------+--------+  Prox to Stent  62      20                                +---------------+--------+--------+--------+--------+--------+  Proximal Stent 64      14                                +---------------+--------+--------+--------+--------+--------+  Mid Stent      76      22                                +---------------+--------+--------+--------+--------+--------+  Distal Stent   71      24                                +---------------+--------+--------+--------+--------+--------+  Distal to Stent82      29                                +---------------+--------+--------+--------+--------+--------+  Left Carotid Findings:  +----------+--------+--------+--------+-------------------------+--------+           PSV cm/sEDV cm/sStenosisPlaque Description       Comments  +----------+--------+--------+--------+-------------------------+--------+  CCA Prox  104     26                                                 +----------+--------+--------+--------+-------------------------+--------+  CCA Mid   88      24              heterogenous                       +----------+--------+--------+--------+-------------------------+--------+  CCA Distal96      25              heterogenous                       +----------+--------+--------+--------+-------------------------+--------+  ICA Prox  99      30      1-39%   heterogenous and calcific          +----------+--------+--------+--------+-------------------------+--------+  ICA Mid   72      23                                                  +----------+--------+--------+--------+-------------------------+--------+  ICA Distal77      32                                                 +----------+--------+--------+--------+-------------------------+--------+  ECA      50      8               calcific                           +----------+--------+--------+--------+-------------------------+--------+   +----------+--------+--------+--------+-------------------+           PSV cm/sEDV cm/sDescribeArm Pressure (mmHG)  +----------+--------+--------+--------+-------------------+  Subclavian129    0               133                  +----------+--------+--------+--------+-------------------+   +---------+--------+--+--------+-+  VertebralPSV cm/s31EDV cm/s7  +---------+--------+--+--------+-+      Summary:  Summary:  Right Carotid: The ECA appears >50% stenosed. Patent stent with no  stenosis.   Left Carotid: Velocities in the left ICA are consistent with a 1-39%  stenosis.   Vertebrals: Bilateral vertebral arteries demonstrate antegrade flow.  Subclavians: Normal flow hemodynamics were seen in bilateral subclavian               arteries.      ASSESSMENT/PLAN: SEBATIAN GRAMBO is a 72 y.o. male status post 12/19/2021 right-sided TCAR for symptomatic ICA stenosis with deficits being  left-sided facial numbness and weakness, left leg paresthesias.   At last visit, he was doing well with improvement in numbness appreciated at the nasolabial fold.  On exam today, it appears there is more strength, however the muscle tends to quiver more.  I do not have an etiology for numbness that radiates across his face.  The neurology in the past, specifically Dr. Leonie Man.  My plan is to send him back in an effort to elucidate an etiology of the facial numbness.  It does not appear to be TIA or stroke related.  Carotid duplex ultrasound of the carotid stent and contralateral ICA  demonstrate widely patent vessels.  At this time, I plan to see [place] on a yearly basis with repeat carotid duplex ultrasound studies.   Broadus Tien, MD [facility] [phone]

## 2022-11-21 ENCOUNTER — Encounter: Payer: Self-pay | Admitting: Vascular Surgery

## 2022-11-21 ENCOUNTER — Ambulatory Visit: Payer: Medicare Other | Admitting: Vascular Surgery

## 2022-11-21 VITALS — BP 127/79 | HR 61 | Temp 97.9°F | Resp 20 | Ht 67.0 in | Wt 210.0 lb

## 2022-11-21 DIAGNOSIS — Z95828 Presence of other vascular implants and grafts: Secondary | ICD-10-CM | POA: Diagnosis not present

## 2022-11-21 DIAGNOSIS — I6521 Occlusion and stenosis of right carotid artery: Secondary | ICD-10-CM

## 2022-12-31 DIAGNOSIS — L570 Actinic keratosis: Secondary | ICD-10-CM | POA: Diagnosis not present

## 2022-12-31 DIAGNOSIS — L814 Other melanin hyperpigmentation: Secondary | ICD-10-CM | POA: Diagnosis not present

## 2022-12-31 DIAGNOSIS — L821 Other seborrheic keratosis: Secondary | ICD-10-CM | POA: Diagnosis not present

## 2022-12-31 DIAGNOSIS — L905 Scar conditions and fibrosis of skin: Secondary | ICD-10-CM | POA: Diagnosis not present

## 2022-12-31 DIAGNOSIS — L578 Other skin changes due to chronic exposure to nonionizing radiation: Secondary | ICD-10-CM | POA: Diagnosis not present

## 2023-01-12 DIAGNOSIS — I87321 Chronic venous hypertension (idiopathic) with inflammation of right lower extremity: Secondary | ICD-10-CM | POA: Diagnosis not present

## 2023-01-12 DIAGNOSIS — M7051 Other bursitis of knee, right knee: Secondary | ICD-10-CM | POA: Diagnosis not present

## 2023-01-13 ENCOUNTER — Other Ambulatory Visit (HOSPITAL_COMMUNITY): Payer: Self-pay | Admitting: Internal Medicine

## 2023-01-13 DIAGNOSIS — M7989 Other specified soft tissue disorders: Secondary | ICD-10-CM

## 2023-01-23 ENCOUNTER — Ambulatory Visit (HOSPITAL_COMMUNITY)
Admission: RE | Admit: 2023-01-23 | Discharge: 2023-01-23 | Disposition: A | Discharge status: Home / Self Care | Payer: Medicare Other | Source: Ambulatory Visit | Attending: Internal Medicine | Admitting: Internal Medicine

## 2023-01-23 DIAGNOSIS — M7989 Other specified soft tissue disorders: Secondary | ICD-10-CM | POA: Insufficient documentation

## 2023-01-23 DIAGNOSIS — R600 Localized edema: Secondary | ICD-10-CM | POA: Diagnosis not present

## 2023-02-04 DIAGNOSIS — H40013 Open angle with borderline findings, low risk, bilateral: Secondary | ICD-10-CM | POA: Diagnosis not present

## 2023-02-16 DIAGNOSIS — E7849 Other hyperlipidemia: Secondary | ICD-10-CM | POA: Diagnosis not present

## 2023-02-16 DIAGNOSIS — I10 Essential (primary) hypertension: Secondary | ICD-10-CM | POA: Diagnosis not present

## 2023-02-16 DIAGNOSIS — Z0000 Encounter for general adult medical examination without abnormal findings: Secondary | ICD-10-CM | POA: Diagnosis not present

## 2023-02-16 DIAGNOSIS — Z8673 Personal history of transient ischemic attack (TIA), and cerebral infarction without residual deficits: Secondary | ICD-10-CM | POA: Diagnosis not present

## 2023-02-16 DIAGNOSIS — I87321 Chronic venous hypertension (idiopathic) with inflammation of right lower extremity: Secondary | ICD-10-CM | POA: Diagnosis not present

## 2023-02-16 DIAGNOSIS — I25798 Atherosclerosis of other coronary artery bypass graft(s) with other forms of angina pectoris: Secondary | ICD-10-CM | POA: Diagnosis not present

## 2023-02-16 DIAGNOSIS — K219 Gastro-esophageal reflux disease without esophagitis: Secondary | ICD-10-CM | POA: Diagnosis not present

## 2023-02-16 DIAGNOSIS — M25561 Pain in right knee: Secondary | ICD-10-CM | POA: Diagnosis not present

## 2023-02-25 NOTE — Progress Notes (Signed)
Cardiology Office Note:    Date:  03/04/2023   ID:  Scott Jacobson, DOB [birth year], MRN [id number]  PCP:  [name], MD  [facility] Providers Cardiologist:  [name], MD     Referring MD: [name], MD   Chief Complaint:  Follow-up     History of Present Illness:   Scott Jacobson is a 72 y.o. male with   history of HTN, HLD and CAD who is here today for cardiac follow up. He was admitted to [facility] November 2017 with an inferior STEMI. His sub-totally occluded RCA was treated with a bare metal stent. He was also found to have severe disease in the left main artery and Circumflex. He underwent 4V CABG (LIMA to LAD, SVG to Diagonal, SVG to OM, SVG to PDA) on 10/03/16 after he had completed 1 month of dual antiplatelet therapy post PCI/bare metal stent placement. LVEF 60-65% by echo November 2017. He had stroke-like symptoms in January 2023 and was seen by Neurology. Brain MRI with March 2023 and was found to have small cortically based infarcts. He was seen by Vascular surgery following finding of right carotid artery stenosis. He underwent right carotid artery stenting in March 2023. Cardiac monitor March 2023 with sinus with PVCs.   Patient last saw Dr. [name] 02/2022 and doing well.   Patient comes in for f/u. Denies chest pain, dyspnea, palpitations, dizziness, edema. Walks 1 mile every other day, no more b/c of knee problems. Continues to have left facial numbness that comes and goes. Followed by neurology and vascular. Has lost 9 lbs in 3 months by watching his diet.            Past Medical History:  Diagnosis Date   Anginal pain ([facility])    Arthritis    "probably"   BPH (benign prostatic hyperplasia)    Carotid artery occlusion    Coronary artery disease    Dyspnea    GERD (gastroesophageal reflux disease)    History of kidney stones    Hypertension    Kidney stones    Macular degeneration    Myocardial infarction [facility])    Nausea and vomiting  02/06/2016   Seasonal allergies    Current Medications: Current Meds  Medication Sig   aspirin EC 81 MG tablet Take 81 mg by mouth daily. Swallow whole.   atorvastatin (LIPITOR) 40 MG tablet TAKE 1 TABLET BY MOUTH EVERY DAY   clopidogrel (PLAVIX) 75 MG tablet Take 75 mg by mouth daily.   loratadine (CLARITIN) 10 MG tablet Take 10 mg by mouth every evening.   metoprolol tartrate (LOPRESSOR) 25 MG tablet Take 1 tablet (25 mg total) by mouth 2 (two) times daily.   Multiple Vitamin (MULTIVITAMIN WITH MINERALS) TABS tablet Take 1 tablet by mouth daily.   Multiple Vitamins-Minerals (PRESERVISION AREDS 2 PO) Take 1 capsule by mouth in the morning and at bedtime.   Omega-3 Fatty Acids (FISH OIL) 1000 MG CAPS Take 1,000-2,000 mg by mouth See admin instructions. Take 2000 mg by mouth in the evening and 1000 mg at bedtime   pantoprazole (PROTONIX) 40 MG tablet Take 40 mg by mouth daily before breakfast.   Polyvinyl Alcohol-Povidone (REFRESH OP) Place 1 drop into both eyes in the morning.   ramipril (ALTACE) 5 MG capsule Take 5 mg by mouth in the morning.   tamsulosin (FLOMAX) 0.4 MG CAPS capsule Take 0.4 mg by mouth in the morning.    Allergies:   Patient  has no known allergies.   Social History   Tobacco Use   Smoking status: Former    Packs/day: 3.00    Years: 16.00    Additional pack years: 0.00    Total pack years: 48.00    Types: Cigarettes    Quit date: 02/04/1979    Years since quitting: 44.1   Smokeless tobacco: Never  Vaping Use   Vaping Use: Never used  Substance Use Topics   Alcohol use: Yes    Comment: occassionally   Drug use: No    Family Hx: The patient's family history includes Congestive Heart Failure in his father; Hypertension in his mother.  ROS     Physical Exam:    VS:  BP 130/72   Pulse (!) 55   Ht 5\' 8"  (1.727 m)   Wt 203 lb 3.2 oz (92.2 kg)   SpO2 98%   BMI 30.90 kg/m     Wt Readings from Last 3 Encounters:  03/04/23 203 lb 3.2 oz (92.2 kg)   11/21/22 210 lb (95.3 kg)  08/05/22 206 lb (93.4 kg)    Physical Exam  GEN: Obese, in no acute distress  Neck: no JVD, carotid bruits, or masses Cardiac:RRR; no murmurs, rubs, or gallops  Respiratory:  clear to auscultation bilaterally, normal work of breathing GI: soft, nontender, nondistended, + BS Ext: without cyanosis, clubbing, or edema, Good distal pulses bilaterally Neuro:  Alert and Oriented x 3,  Psych: euthymic mood, full affect        EKGs/Labs/Other Test Reviewed:    EKG:  EKG is   ordered today.  The ekg ordered today demonstrates sinus bradycardia nonspecific ST changes  Recent Labs: No results found for requested labs within last 365 days.   Recent Lipid Panel No results for input(s): "CHOL", "TRIG", "HDL", "VLDL", "LDLCALC", "LDLDIRECT" in the last 8760 hours.   Prior CV Studies:   Echo  02/2022 IMPRESSIONS     1. Left ventricular ejection fraction, by estimation, is 55 to 60%. The  left ventricle has normal function. The left ventricle demonstrates  regional wall motion abnormalities (see scoring diagram/findings for  description). There is mild left ventricular   hypertrophy. Left ventricular diastolic parameters were normal.   2. Right ventricular systolic function is normal. The right ventricular  size is normal. There is normal pulmonary artery systolic pressure. The  estimated right ventricular systolic pressure is 26.6 mmHg.   3. Left atrial size was mildly dilated.   4. The mitral valve is grossly normal. Mild mitral valve regurgitation.   5. The aortic valve is tricuspid. Aortic valve regurgitation is not  visualized.   6. The inferior vena cava is normal in size with greater than 50%  respiratory variability, suggesting right atrial pressure of 3 mmHg.   Comparison(s): Prior images unable to be directly viewed.     Risk Assessment/Calculations/Metrics:              ASSESSMENT & PLAN:   No problem-specific Assessment & Plan notes  found for this encounter.   CAD s/p CABG without angina: He had 4V CABG in December 2017. No chest pain. Continue ASA, Plavix (recent carotid artery stenting in March 2023). Continue statin and beta blocker.    Echo 02/2022 normal LV function. No angina. Increase exercise 150 min/day.     HTN: BP is well controlled. No changes   Hyperlipidemia: LDL 46 in March 2023. Continue statin    Carotid artery disease: s/p right carotid artery stenting in  March 2023. Continue DAPT per Vascular surgery recommendations.               Dispo:  No follow-ups on file.   Medication Adjustments/Labs and Tests Ordered: Current medicines are reviewed at length with the patient today.  Concerns regarding medicines are outlined above.  Tests Ordered: Orders Placed This Encounter  Procedures   EKG 12-Lead   Medication Changes: No orders of the defined types were placed in this encounter.  Elson Clan, PA-C  03/04/2023 10:00 AM    [facility] [address] [place], [place], [place]  [address] Phone: [phone]; Fax: [phone]

## 2023-03-04 ENCOUNTER — Encounter: Payer: Self-pay | Admitting: Physician Assistant

## 2023-03-04 ENCOUNTER — Ambulatory Visit: Payer: Medicare Other | Attending: Physician Assistant | Admitting: Physician Assistant

## 2023-03-04 VITALS — BP 130/72 | HR 55 | Ht 68.0 in | Wt 203.2 lb

## 2023-03-04 DIAGNOSIS — I6523 Occlusion and stenosis of bilateral carotid arteries: Secondary | ICD-10-CM | POA: Diagnosis not present

## 2023-03-04 DIAGNOSIS — I25810 Atherosclerosis of coronary artery bypass graft(s) without angina pectoris: Secondary | ICD-10-CM | POA: Diagnosis not present

## 2023-03-04 DIAGNOSIS — I10 Essential (primary) hypertension: Secondary | ICD-10-CM | POA: Diagnosis not present

## 2023-03-04 DIAGNOSIS — E785 Hyperlipidemia, unspecified: Secondary | ICD-10-CM | POA: Diagnosis not present

## 2023-03-04 NOTE — Patient Instructions (Signed)
Medication Instructions:  Your physician has requested that you have an abdominal aorta duplex. During this test, an ultrasound is used to evaluate the aorta. Allow 30 minutes for this exam. Do not eat after midnight the day before and avoid carbonated beverages  *If you need a refill on your cardiac medications before your next appointment, please call your pharmacy*   Follow-Up: At [facility], you and your health needs are our priority.  As part of our continuing mission to provide you with exceptional heart care, we have created designated Provider Care Teams.  These Care Teams include your primary Cardiologist (physician) and Advanced Practice Providers (APPs -  Physician Assistants and Nurse Practitioners) who all work together to provide you with the care you need, when you need it.   Your next appointment:   1 year(s)  Provider:   [name], MD

## 2023-04-13 ENCOUNTER — Ambulatory Visit (INDEPENDENT_AMBULATORY_CARE_PROVIDER_SITE_OTHER): Payer: Medicare Other | Admitting: Orthopaedic Surgery

## 2023-04-13 DIAGNOSIS — M25562 Pain in left knee: Secondary | ICD-10-CM | POA: Diagnosis not present

## 2023-04-13 DIAGNOSIS — G8929 Other chronic pain: Secondary | ICD-10-CM

## 2023-04-13 MED ORDER — LIDOCAINE HCL 1 % IJ SOLN
3.00 mL | INTRAMUSCULAR | Status: AC | PRN
Start: 2023-04-13 — End: 2023-04-13
  Administered 2023-04-13: 3 mL

## 2023-04-13 MED ORDER — METHYLPREDNISOLONE ACETATE 40 MG/ML IJ SUSP
40.00 mg | INTRAMUSCULAR | Status: AC | PRN
Start: 2023-04-13 — End: 2023-04-13
  Administered 2023-04-13: 40 mg via INTRA_ARTICULAR

## 2023-04-13 NOTE — Progress Notes (Signed)
The patient is a very pleasant and active 72 year old gentleman sent to me from Dr. Olena Leatherwood to evaluate and treat right knee pain.  The patient actually had x-rays of his knee in March of last year that did not show any acute findings and were pretty normal.  I was able to review this on the canopy system and showed these to the patient.  His medial lateral compartment spaces are well-maintained with only slight varus malalignment.  There is patellofemoral arthritic changes.  He is not a diabetic.  He does wear knee sleeve.  He did have a steroid injection around the medial aspect of his knee last year on that right side and he said that did not really help at all.  He has had carotid artery surgery and has a stent in place.  He is on Plavix so he cannot take anti-inflammatories.  I was able to review all of his medications and past medical history in epic.  Examination of his right knee shows just a slight effusion.  His range of motion is full and the knee feels ligamentously stable.  There is slight patellofemoral crepitation.  There is not a significant amount of pain.  He does not walk with a limp.  I did feel it was reasonable to try an intra-articular steroid injection in his right knee today and described the rationale behind that as well as the risk and benefits involved.  He agreed to the injection and tolerated well.  Will see him back in about 3 weeks to see if this is helped.  If it is not, I would recommend a MRI of his right knee given that his plain films show well-maintained joint space.     Procedure Note  Patient: Scott Jacobson             Date of Birth: 1951-01-12           MRN: [id number]             Visit Date: 04/13/2023  Procedures: Visit Diagnoses:  1. Chronic pain of left knee     Large Joint Inj: R knee on 04/13/2023 2:07 PM Indications: diagnostic evaluation and pain Details: 22 G 1.5 in needle, superolateral approach  Arthrogram: No  Medications: 3 mL lidocaine 1  %; 40 mg methylPREDNISolone acetate 40 MG/ML Outcome: tolerated well, no immediate complications Procedure, treatment alternatives, risks and benefits explained, specific risks discussed. Consent was given by the patient. Immediately prior to procedure a time out was called to verify the correct patient, procedure, equipment, support staff and site/side marked as required. Patient was prepped and draped in the usual sterile fashion.

## 2023-05-05 DIAGNOSIS — L821 Other seborrheic keratosis: Secondary | ICD-10-CM | POA: Diagnosis not present

## 2023-05-05 DIAGNOSIS — D225 Melanocytic nevi of trunk: Secondary | ICD-10-CM | POA: Diagnosis not present

## 2023-05-05 DIAGNOSIS — L814 Other melanin hyperpigmentation: Secondary | ICD-10-CM | POA: Diagnosis not present

## 2023-05-05 DIAGNOSIS — L570 Actinic keratosis: Secondary | ICD-10-CM | POA: Diagnosis not present

## 2023-05-05 DIAGNOSIS — Z09 Encounter for follow-up examination after completed treatment for conditions other than malignant neoplasm: Secondary | ICD-10-CM | POA: Diagnosis not present

## 2023-05-06 ENCOUNTER — Encounter: Payer: Self-pay | Admitting: Orthopaedic Surgery

## 2023-05-06 ENCOUNTER — Ambulatory Visit: Payer: Medicare Other | Admitting: Orthopaedic Surgery

## 2023-05-06 DIAGNOSIS — G8929 Other chronic pain: Secondary | ICD-10-CM

## 2023-05-06 DIAGNOSIS — M25562 Pain in left knee: Secondary | ICD-10-CM | POA: Diagnosis not present

## 2023-05-06 NOTE — Progress Notes (Signed)
The patient is a 72 year old gentleman worsening in follow-up.  This is for his right knee.  His x-rays show a normal-appearing hip joint space.  He has never had a steroid injection but had conservative treatment with activity modification and quad strengthening exercises.  He cannot take anti-inflammatories due to being on Plavix.  He said the steroid injection did help some but he still having occasional locking and tenderness he points to the medial aspect of his knee as a source of his pain.  Examination of his right knee does show medial joint line tenderness.  There is varus malalignment that is correctable.  He has good range of motion of the knee and most of the pain is medial.  It is also posterior medial and there is a positive McMurray's sign to the posterior medial compartment of the knee.  At this point a MRI is warranted of his right knee to assess the meniscus on the medial side and the cartilage based on normal-appearing plain films combined with his clinical exam findings and the failure conservative treatment.  He agrees with this treatment plan.  Will see him back in follow-up as we have the MRI of the right knee.  All questions and concerns were addressed and answered.

## 2023-05-19 ENCOUNTER — Other Ambulatory Visit: Payer: Self-pay | Admitting: Orthopaedic Surgery

## 2023-05-19 DIAGNOSIS — G8929 Other chronic pain: Secondary | ICD-10-CM

## 2023-05-22 ENCOUNTER — Telehealth: Payer: Self-pay | Admitting: Orthopaedic Surgery

## 2023-05-22 NOTE — Telephone Encounter (Signed)
Patient called and wanted you to know that he has his MRI done tomorrow. Dr.Blackman told patient to let him know.[phone]

## 2023-05-23 ENCOUNTER — Ambulatory Visit
Admission: RE | Admit: 2023-05-23 | Discharge: 2023-05-23 | Disposition: A | Discharge status: Home / Self Care | Payer: Medicare Other | Source: Ambulatory Visit | Attending: Orthopaedic Surgery | Admitting: Orthopaedic Surgery

## 2023-05-23 DIAGNOSIS — M23321 Other meniscus derangements, posterior horn of medial meniscus, right knee: Secondary | ICD-10-CM | POA: Diagnosis not present

## 2023-05-23 DIAGNOSIS — M25461 Effusion, right knee: Secondary | ICD-10-CM | POA: Diagnosis not present

## 2023-05-23 DIAGNOSIS — M1711 Unilateral primary osteoarthritis, right knee: Secondary | ICD-10-CM | POA: Diagnosis not present

## 2023-05-23 DIAGNOSIS — G8929 Other chronic pain: Secondary | ICD-10-CM

## 2023-05-23 DIAGNOSIS — M25561 Pain in right knee: Secondary | ICD-10-CM | POA: Diagnosis not present

## 2023-05-25 NOTE — Telephone Encounter (Signed)
Called and scheduled MRI review

## 2023-05-26 DIAGNOSIS — Z8673 Personal history of transient ischemic attack (TIA), and cerebral infarction without residual deficits: Secondary | ICD-10-CM | POA: Diagnosis not present

## 2023-05-26 DIAGNOSIS — K219 Gastro-esophageal reflux disease without esophagitis: Secondary | ICD-10-CM | POA: Diagnosis not present

## 2023-05-26 DIAGNOSIS — I87321 Chronic venous hypertension (idiopathic) with inflammation of right lower extremity: Secondary | ICD-10-CM | POA: Diagnosis not present

## 2023-05-26 DIAGNOSIS — E7849 Other hyperlipidemia: Secondary | ICD-10-CM | POA: Diagnosis not present

## 2023-05-26 DIAGNOSIS — I25798 Atherosclerosis of other coronary artery bypass graft(s) with other forms of angina pectoris: Secondary | ICD-10-CM | POA: Diagnosis not present

## 2023-05-26 DIAGNOSIS — I10 Essential (primary) hypertension: Secondary | ICD-10-CM | POA: Diagnosis not present

## 2023-05-26 DIAGNOSIS — Z0000 Encounter for general adult medical examination without abnormal findings: Secondary | ICD-10-CM | POA: Diagnosis not present

## 2023-05-26 DIAGNOSIS — M25561 Pain in right knee: Secondary | ICD-10-CM | POA: Diagnosis not present

## 2023-05-28 ENCOUNTER — Encounter: Payer: Self-pay | Admitting: Orthopaedic Surgery

## 2023-05-28 ENCOUNTER — Ambulatory Visit: Payer: Medicare Other | Admitting: Orthopaedic Surgery

## 2023-05-28 DIAGNOSIS — M25561 Pain in right knee: Secondary | ICD-10-CM | POA: Diagnosis not present

## 2023-05-28 DIAGNOSIS — G8929 Other chronic pain: Secondary | ICD-10-CM

## 2023-05-28 DIAGNOSIS — M1711 Unilateral primary osteoarthritis, right knee: Secondary | ICD-10-CM | POA: Diagnosis not present

## 2023-05-28 NOTE — Progress Notes (Signed)
The patient is a 72 year old gentleman who comes in today to go over a MRI of his right knee.  He had been having locking and catching of that knee and pain since early 2023.  There is been no known injury.  His plain films actually showed a well-maintained joint space within the knee and a MRI was warranted after the failure conservative treatment including steroid injections.  Examination of his right knee continues show medial joint line tenderness and patellofemoral tenderness.  There is pain throughout the arc of motion of his knee.  The MRI of his right knee shows extensive full-thickness cartilage loss throughout the medial compartment of his knee.  This involves the femoral condyle and the tibial plateau.  There is extensive edema throughout the knee on that medial side as well and a large medial meniscal tear that is complex with a flap component.  There is thinning of the cartilage of the patellofemoral joint and the lateral compartment of his knee.  We had a long and thorough discussion about his knee.  The recommendation surgical would be a knee replacement given the extensive cartilage loss in his knee.  I went over knee replacement surgery in detail and showed him the x-rays and the MRI findings.  I went over a knee replacement model.  We discussed the risks and benefits of surgery and what to expect from an intraoperative and postoperative standpoint.  He is on Plavix so he will need to stop Plavix for a week prior to surgery.  He can continue his baby aspirin.  He sees a cardiologist yearly and I was able to review all her notes and there is no acute findings.  All questions and concerns were answered and addressed.  Will work on getting him scheduled for a right knee replacement in the near future.

## 2023-06-09 ENCOUNTER — Other Ambulatory Visit: Payer: Self-pay

## 2023-06-09 ENCOUNTER — Emergency Department (HOSPITAL_COMMUNITY)
Admission: EM | Admit: 2023-06-09 | Discharge: 2023-06-09 | Disposition: A | Discharge status: Home / Self Care | Payer: Medicare Other | Attending: Emergency Medicine | Admitting: Emergency Medicine

## 2023-06-09 ENCOUNTER — Emergency Department (HOSPITAL_COMMUNITY): Payer: Medicare Other

## 2023-06-09 ENCOUNTER — Encounter (HOSPITAL_COMMUNITY): Payer: Self-pay

## 2023-06-09 DIAGNOSIS — Z7982 Long term (current) use of aspirin: Secondary | ICD-10-CM | POA: Insufficient documentation

## 2023-06-09 DIAGNOSIS — N132 Hydronephrosis with renal and ureteral calculous obstruction: Secondary | ICD-10-CM | POA: Insufficient documentation

## 2023-06-09 DIAGNOSIS — K5730 Diverticulosis of large intestine without perforation or abscess without bleeding: Secondary | ICD-10-CM | POA: Diagnosis not present

## 2023-06-09 DIAGNOSIS — I2510 Atherosclerotic heart disease of native coronary artery without angina pectoris: Secondary | ICD-10-CM | POA: Insufficient documentation

## 2023-06-09 DIAGNOSIS — D3502 Benign neoplasm of left adrenal gland: Secondary | ICD-10-CM | POA: Diagnosis not present

## 2023-06-09 DIAGNOSIS — K4090 Unilateral inguinal hernia, without obstruction or gangrene, not specified as recurrent: Secondary | ICD-10-CM | POA: Diagnosis not present

## 2023-06-09 DIAGNOSIS — Z7902 Long term (current) use of antithrombotics/antiplatelets: Secondary | ICD-10-CM | POA: Insufficient documentation

## 2023-06-09 DIAGNOSIS — N201 Calculus of ureter: Secondary | ICD-10-CM | POA: Diagnosis not present

## 2023-06-09 DIAGNOSIS — R109 Unspecified abdominal pain: Secondary | ICD-10-CM | POA: Diagnosis present

## 2023-06-09 DIAGNOSIS — K429 Umbilical hernia without obstruction or gangrene: Secondary | ICD-10-CM | POA: Diagnosis not present

## 2023-06-09 LAB — URINALYSIS, ROUTINE W REFLEX MICROSCOPIC
Bilirubin Urine: NEGATIVE
Glucose, UA: NEGATIVE mg/dL
Ketones, ur: NEGATIVE mg/dL
Leukocytes,Ua: NEGATIVE
Nitrite: NEGATIVE
Protein, ur: 30 mg/dL — AB
RBC / HPF: >50 RBC/hpf (ref 0–5)
Specific Gravity, Urine: 1.015 (ref 1.005–1.030)
pH: 5 (ref 5.0–8.0)

## 2023-06-09 LAB — HEPATIC FUNCTION PANEL
ALT: 33 U/L (ref 0–44)
AST: 32 U/L (ref 15–41)
Albumin: 4 g/dL (ref 3.5–5.0)
Alkaline Phosphatase: 102 U/L (ref 38–126)
Bilirubin, Direct: 0.1 mg/dL (ref 0.0–0.2)
Indirect Bilirubin: 0.6 mg/dL (ref 0.3–0.9)
Total Bilirubin: 0.7 mg/dL (ref 0.3–1.2)
Total Protein: 7.1 g/dL (ref 6.5–8.1)

## 2023-06-09 LAB — BASIC METABOLIC PANEL
Anion gap: 9 (ref 5–15)
BUN: 18 mg/dL (ref 8–23)
CO2: 25 mmol/L (ref 22–32)
Calcium: 8.7 mg/dL — ABNORMAL LOW (ref 8.9–10.3)
Chloride: 105 mmol/L (ref 98–111)
Creatinine, Ser: 0.9 mg/dL (ref 0.61–1.24)
GFR, Estimated: >60 mL/min (ref >60)
Glucose, Bld: 116 mg/dL — ABNORMAL HIGH (ref 70–99)
Potassium: 3.8 mmol/L (ref 3.5–5.1)
Sodium: 139 mmol/L (ref 135–145)

## 2023-06-09 LAB — CBC
HCT: 45.4 % (ref 39.0–52.0)
Hemoglobin: 14.7 g/dL (ref 13.0–17.0)
MCH: 30.2 pg (ref 26.0–34.0)
MCHC: 32.4 g/dL (ref 30.0–36.0)
MCV: 93.2 fL (ref 80.0–100.0)
Platelets: 207 10*3/uL (ref 150–400)
RBC: 4.87 MIL/uL (ref 4.22–5.81)
RDW: 13.2 % (ref 11.5–15.5)
WBC: 9.8 10*3/uL (ref 4.0–10.5)
nRBC: 0 % (ref 0.0–0.2)

## 2023-06-09 LAB — MAGNESIUM: Magnesium: 2.2 mg/dL (ref 1.7–2.4)

## 2023-06-09 MED ORDER — TAMSULOSIN HCL 0.4 MG PO CAPS
0.4000 mg | ORAL_CAPSULE | Freq: Once | ORAL | Status: AC
  Administered 2023-06-09: 0.4 mg via ORAL
  Filled 2023-06-09: qty 1

## 2023-06-09 MED ORDER — HYDROMORPHONE HCL 1 MG/ML IJ SOLN
0.5000 mg | Freq: Once | INTRAMUSCULAR | Status: AC
  Administered 2023-06-09: 0.5 mg via INTRAVENOUS
  Filled 2023-06-09: qty 0.5

## 2023-06-09 MED ORDER — KETOROLAC TROMETHAMINE 15 MG/ML IJ SOLN
15.0000 mg | Freq: Once | INTRAMUSCULAR | Status: AC
  Administered 2023-06-09: 15 mg via INTRAVENOUS
  Filled 2023-06-09: qty 1

## 2023-06-09 MED ORDER — ONDANSETRON HCL 4 MG/2ML IJ SOLN
4.0000 mg | Freq: Once | INTRAMUSCULAR | Status: AC
  Administered 2023-06-09: 4 mg via INTRAVENOUS
  Filled 2023-06-09: qty 2

## 2023-06-09 MED ORDER — TAMSULOSIN HCL 0.4 MG PO CAPS: 0.4000 mg | ORAL_CAPSULE | Freq: Every day | ORAL | 0 refills | Status: AC

## 2023-06-09 MED ORDER — ONDANSETRON 4 MG PO TBDP: 4.0000 mg | ORAL_TABLET | Freq: Three times a day (TID) | ORAL | 0 refills | Status: DC | PRN

## 2023-06-09 MED ORDER — LACTATED RINGERS IV BOLUS
1000.0000 mL | Freq: Once | INTRAVENOUS | Status: AC
  Administered 2023-06-09: 1000 mL via INTRAVENOUS

## 2023-06-09 NOTE — Discharge Instructions (Addendum)
You have a kidney stone.  Ensure that you drink plenty of fluids to help pass it.  A prescription for medication called tamsulosin was sent to your pharmacy.  Take this daily.  You did receive today's dose this morning in the ER.  A prescription for medication called ondansetron was also sent.  This is to be taken as needed for nausea.  Take ibuprofen and Tylenol as needed for pain.  Call the telephone number below to set up an appointment with urology.  Return to the emergency department for any worsening symptoms.

## 2023-06-09 NOTE — ED Triage Notes (Signed)
Pt c/o right sided flank pain and N/V. Hx of kidney stones.

## 2023-06-09 NOTE — ED Provider Notes (Signed)
EMERGENCY DEPARTMENT AT [facility] Provider Note   CSN: [id number] Arrival date & time: 06/09/23  0340     History  Chief Complaint  Patient presents with   Flank Pain    Scott Jacobson is a 72 y.o. male.   Flank Pain  Patient presents for flank pain.  Medical history includes nephrolithiasis, GERD, CAD, BPH, arthritis, CAD.  Flank pain is right-sided.  Onset was midday yesterday.  He has not taken anything for pain.  He did have nausea and vomiting.  He denies any fevers or chills.     Home Medications Prior to Admission medications   Medication Sig Start Date End Date Taking? Authorizing Provider  ondansetron (ZOFRAN-ODT) 4 MG disintegrating tablet Take 1 tablet (4 mg total) by mouth every 8 (eight) hours as needed for nausea or vomiting. 06/09/23  Yes Gloris Manchester, MD  tamsulosin (FLOMAX) 0.4 MG CAPS capsule Take 1 capsule (0.4 mg total) by mouth daily for 5 days. 06/09/23 06/14/23 Yes Gloris Manchester, MD  aspirin EC 81 MG tablet Take 81 mg by mouth daily. Swallow whole.    [provider]  atorvastatin (LIPITOR) 40 MG tablet TAKE 1 TABLET BY MOUTH EVERY DAY 05/01/20   Kathleene Hazel, MD  clopidogrel (PLAVIX) 75 MG tablet Take 75 mg by mouth daily. 10/01/21   [provider]  loratadine (CLARITIN) 10 MG tablet Take 10 mg by mouth every evening.    [provider]  metoprolol tartrate (LOPRESSOR) 25 MG tablet Take 1 tablet (25 mg total) by mouth 2 (two) times daily. 08/23/16   Tereso Newcomer T, PA-C  Multiple Vitamin (MULTIVITAMIN WITH MINERALS) TABS tablet Take 1 tablet by mouth daily.    [provider]  Multiple Vitamins-Minerals (PRESERVISION AREDS 2 PO) Take 1 capsule by mouth in the morning and at bedtime.    [provider]  Omega-3 Fatty Acids (FISH OIL) 1000 MG CAPS Take 1,000-2,000 mg by mouth See admin instructions. Take 2000 mg by mouth in the evening and 1000 mg at bedtime    [provider]   pantoprazole (PROTONIX) 40 MG tablet Take 40 mg by mouth daily before breakfast.    [provider]  Polyvinyl Alcohol-Povidone (REFRESH OP) Place 1 drop into both eyes in the morning.    [provider]  ramipril (ALTACE) 5 MG capsule Take 5 mg by mouth in the morning. 01/16/17   [provider]      Allergies    Patient has no known allergies.    Review of Systems   Review of Systems  Gastrointestinal:  Positive for nausea and vomiting.  Genitourinary:  Positive for flank pain.  All other systems reviewed and are negative.   Physical Exam Updated Vital Signs BP (!) 167/85 (BP Location: Right Arm)   Pulse 66   Temp 97.6 F (36.4 C) (Oral)   Resp 17   Ht 5\' 8"  (1.727 m)   Wt 93 kg   SpO2 92%   BMI 31.17 kg/m  Physical Exam Vitals and nursing note reviewed.  Constitutional:      General: He is not in acute distress.    Appearance: Normal appearance. He is well-developed. He is not ill-appearing, toxic-appearing or diaphoretic.  HENT:     Head: Normocephalic and atraumatic.     Right Ear: External ear normal.     Left Ear: External ear normal.     Nose: Nose normal.     Mouth/Throat:  Mouth: Mucous membranes are moist.  Eyes:     Extraocular Movements: Extraocular movements intact.     Conjunctiva/sclera: Conjunctivae normal.  Cardiovascular:     Rate and Rhythm: Normal rate and regular rhythm.  Pulmonary:     Effort: Pulmonary effort is normal. No respiratory distress.  Abdominal:     General: There is no distension.     Palpations: Abdomen is soft.     Tenderness: There is no abdominal tenderness.  Musculoskeletal:        General: No swelling. Normal range of motion.     Cervical back: Normal range of motion and neck supple.  Skin:    General: Skin is warm and dry.     Coloration: Skin is not jaundiced or pale.  Neurological:     General: No focal deficit present.     Mental Status: He is alert and oriented to person, place, and  time.  Psychiatric:        Mood and Affect: Mood normal.        Behavior: Behavior normal.     ED Results / Procedures / Treatments   Labs (all labs ordered are listed, but only abnormal results are displayed) Labs Reviewed  URINALYSIS, ROUTINE W REFLEX MICROSCOPIC - Abnormal; Notable for the following components:      Result Value   APPearance CLOUDY (*)    Hgb urine dipstick LARGE (*)    Protein, ur 30 (*)    Bacteria, UA RARE (*)    All other components within normal limits  BASIC METABOLIC PANEL - Abnormal; Notable for the following components:   Glucose, Bld 116 (*)    Calcium 8.7 (*)    All other components within normal limits  CBC  HEPATIC FUNCTION PANEL  MAGNESIUM    EKG None  Radiology CT Renal Stone Study  Result Date: 06/09/2023 CLINICAL DATA:  History of kidney stones with abdominal/flank pain right side and nausea and vomiting. EXAM: CT ABDOMEN AND PELVIS WITHOUT CONTRAST TECHNIQUE: Multidetector CT imaging of the abdomen and pelvis was performed following the standard protocol without IV contrast. RADIATION DOSE REDUCTION: This exam was performed according to the departmental dose-optimization program which includes automated exposure control, adjustment of the mA and/or kV according to patient size and/or use of iterative reconstruction technique. COMPARISON:  Report of prior CT without contrast 04/04/2007, with images unavailable in PACS at time of reading. FINDINGS: Lower chest: Lung bases are clear. The cardiac size is normal. There is a moderate-sized hiatal hernia. There are calcifications in the right coronary artery. Hepatobiliary: The liver is mildly steatotic. There is an indeterminate hypodense lesion measuring 2.6 cm in segment 4B on 2:21. MRI without and with contrast is recommended. There are a few scattered hypodensities in the hepatic substance consistent with cysts and additional subcentimeter too small to characterize hypodensities. Largest cyst is 1.7  cm and 3 Hounsfield units in segment 7 on 2:15. Rest of the unenhanced liver is unremarkable. The gallbladder bile ducts unremarkable. Pancreas: Unremarkable without contrast. Spleen: Unremarkable without contrast. Adrenals/Urinary Tract: There is a 2 cm adenoma of the left adrenal gland, Hounsfield density is 9.2. No follow-up imaging is needed. The right adrenal gland is unremarkable. Posteriorly in the right kidney there is a Bosniak 1 cyst measuring 5.1 cm with a Hounsfield density of -1.3. No follow-up imaging is recommended. No other contour deforming abnormality of the kidneys is seen. There are a few punctate nonobstructive caliceal stones in the right kidney and a 3  mm solitary nonobstructing caliceal stone inferiorly in the left kidney. There is a 4 mm stone in the proximal right ureter, superimposing at the level of the right L4 pedicle, causing mild upstream hydroureteronephrosis. Both ureters are otherwise clear. The bladder is contracted and not well demonstrated but could be thickened. Correlate clinically for cystitis. There are no intravesical stones. Stomach/Bowel: Unremarkable stomach and small bowel. Normal appendix. Mild fecal stasis ascending colon. Advanced left colonic diverticulosis is seen without diverticulitis. Vascular/Lymphatic: There is heavy aortoiliac calcific plaque. 2.7 cm fusiform infrarenal AAA. There is a rim calcified 1 cm left renal artery aneurysm alongside the renal hilum. No lymphadenopathy is seen. Reproductive: No prostatomegaly. There are calcifications in the left lobe. Other: Small umbilical and left inguinal fat hernias. No incarcerated hernia. No free fluid, free hemorrhage or free air, or focal inflammatory process is seen. Musculoskeletal: Advanced degenerative changes at L4-5 and L5-S1 with osteopenia. Spondylosis thoracic and upper lumbar spine. There is mild hip DJD. No acute or other significant osseous findings. IMPRESSION: 1. 4 mm stone in the proximal right  ureter with mild upstream hydroureteronephrosis. 2. Bilateral nonobstructive nephrolithiasis. 3. Bladder is contracted and not well demonstrated but could be thickened. Correlate clinically for cystitis. 4. Indeterminate 2.6 cm hypodense lesion in segment 4B of the liver. MRI without and with contrast is recommended. Scattered cysts. 5. Constipation and diverticulosis. 6. Aortic and coronary artery atherosclerosis. 7. 2.7 cm fusiform infrarenal AAA. Recommend imaging follow-up every 5 years. 8. 2 cm left adrenal adenoma. No follow-up imaging is needed. 9. Umbilical and left inguinal fat hernias. 10. Osteopenia and degenerative change. Aortic Atherosclerosis (ICD10-I70.0). Electronically Signed   By: Almira Bar M.D.   On: 06/09/2023 05:11    Procedures Procedures    Medications Ordered in ED Medications  tamsulosin (FLOMAX) capsule 0.4 mg (has no administration in time range)  lactated ringers bolus 1,000 mL (1,000 mLs Intravenous New Bag/Given 06/09/23 0452)  HYDROmorphone (DILAUDID) injection 0.5 mg (0.5 mg Intravenous Given 06/09/23 0450)  ondansetron (ZOFRAN) injection 4 mg (4 mg Intravenous Given 06/09/23 0449)  ketorolac (TORADOL) 15 MG/ML injection 15 mg (15 mg Intravenous Given 06/09/23 0525)    ED Course/ Medical Decision Making/ A&P                                 Medical Decision Making Amount and/or Complexity of Data Reviewed Labs: ordered. Radiology: ordered.  Risk Prescription drug management.   This patient presents to the ED for concern of flank pain, this involves an extensive number of treatment options, and is a complaint that carries with it a high risk of complications and morbidity.  The differential diagnosis includes nephrolithiasis, pyelonephritis, MSK injury, constipation, colitis, neoplasm   Co morbidities that complicate the patient evaluation  nephrolithiasis, GERD, CAD, BPH, arthritis, CAD   Additional history obtained:  Additional history obtained from  N/A External records from outside source obtained and reviewed including EMR   Lab Tests:  I Ordered, and personally interpreted labs.  The pertinent results include: Normal kidney function, normal electrolytes, normal hemoglobin, no leukocytosis, normal hepatobiliary enzymes.  Urinalysis shows hematuria without clear evidence of infection.   Imaging Studies ordered:  I ordered imaging studies including CT stone study I independently visualized and interpreted imaging which showed right-sided 4 mm proximal ureteral stone with mild upstream hydronephrosis.  Incidental finding of hypodense liver lesion. I agree with the radiologist interpretation   Cardiac Monitoring: /  EKG:  The patient was maintained on a cardiac monitor.  I personally viewed and interpreted the cardiac monitored which showed an underlying rhythm of: Sinus rhythm   Problem List / ED Course / Critical interventions / Medication management  Patient presents for right sided flank pain, nausea, and vomiting.  On arrival in the ED, vital signs are notable for hypertension.  Patient appears moderately uncomfortable on exam.  He describes area of pain as posterior aspect of his right flank.  He has no associated tenderness.  Abdomen is soft and nontender.  Dilaudid was ordered for analgesia.  Zofran was ordered for nausea.  IV fluids were ordered for hydration, given his fluid losses.  Lab work and CT imaging studies were ordered.  Patient's lab work is unremarkable.  Urinalysis does show hematuria without clear evidence of infection.  On CT stone study, he does have a 4 mm proximal ureteral stone on the right side.  This does explain his symptoms.  Although he has been prescribed Flomax in the past, he states that he no longer takes this.  Dose was given in the ED.  Toradol was ordered for ongoing analgesia.  On reassessment, patient's pain is now 4/10 in severity.  Nausea has resolved.  Patient is stable for discharge with  outpatient urology follow-up.  Prescriptions were sent for Zofran and Flomax.  Patient declined prescription for narcotic pain medication.  He was advised to take ibuprofen and Tylenol as needed for pain.  He was advised to return for worsening symptoms.  He was discharged in stable condition. I ordered medication including Dilaudid and Toradol for analgesia; IV fluids for hydration; Zofran for nausea; Flomax for nephrolithiasis   Reevaluation of the patient after these medicines showed that the patient improved I have reviewed the patients home medicines and have made adjustments as needed   Social Determinants of Health:  Has PCP         Final Clinical Impression(s) / ED Diagnoses Final diagnoses:  Ureterolithiasis    Rx / DC Orders ED Discharge Orders          Ordered    tamsulosin (FLOMAX) 0.4 MG CAPS capsule  Daily        06/09/23 0555    ondansetron (ZOFRAN-ODT) 4 MG disintegrating tablet  Every 8 hours PRN        06/09/23 0555              Gloris Manchester, MD 06/09/23 [phone]

## 2023-06-09 NOTE — ED Notes (Signed)
Patient transported to CT 

## 2023-06-12 DIAGNOSIS — K219 Gastro-esophageal reflux disease without esophagitis: Secondary | ICD-10-CM | POA: Insufficient documentation

## 2023-06-12 DIAGNOSIS — I252 Old myocardial infarction: Secondary | ICD-10-CM | POA: Insufficient documentation

## 2023-06-12 DIAGNOSIS — N200 Calculus of kidney: Secondary | ICD-10-CM | POA: Insufficient documentation

## 2023-06-12 NOTE — Progress Notes (Unsigned)
Name: Scott Jacobson DOB: 10-27-1950 MRN: [id number]  History of Present Illness: Mr. Lannen is a 72 y.o. male who presents today as a new patient at [facility] . All available relevant medical records have been reviewed.  - GU History: 1. BPH. 2. Kidney stones. - He {Actions; denies-reports:120008} prior history of kidney stone procedure(s) ***including ***ESWL ***ureteroscopic stone manipulation ***PCNL. 3. Left adrenal adenoma.  - Per CT on 06/09/2023: "2 cm adenoma of the left adrenal gland, Hounsfield density is 9.2. No follow-up imaging is needed." 4. Right renal cyst. - Per CT on 06/09/2023: Bosniak 1; No follow-up imaging is recommended.  He reports concern of kidney stone(s).   Recent history: He was seen in the ER on 06/09/2023 for right flank pain. CMP with normal renal function (GFR >60; creatinine 0.90). CBC with no leukocytosis (WBC 9.8). UA showed 11-20 WBC/hpf, >50 RBC/hpf, bacteria (rare). CT showed a 4 mm proximal right ureteral stone with mild upstream hydroureteronephrosis. Also has a few punctate nonobstructive caliceal stones in the right kidney and a 3 mm solitary nonobstructing caliceal stone inferiorly in the left kidney.  He was discharged with prescriptions for Flomax and Zofran. He declined prescription for narcotic pain medication; was advised to take ibuprofen and Tylenol PRN.  Today: He {Actions; denies-reports:120008} stone passage. He {Actions; denies-reports:120008} flank pain / abdominal pain. He states symptoms were first noticed *** and have been ***constant / intermittent / progressively worsening / improving***.  He {Actions; denies-reports:120008} increased urinary urgency, frequency, nocturia, dysuria, gross hematuria, hesitancy, straining to void, or sensations of incomplete emptying.   Fall Screening: Do you usually have a device to assist in your mobility? {yes/no:20286} ***cane / ***walker /  ***wheelchair  Medications: Current Outpatient Medications  Medication Sig Dispense Refill   aspirin EC 81 MG tablet Take 81 mg by mouth daily. Swallow whole.     atorvastatin (LIPITOR) 40 MG tablet TAKE 1 TABLET BY MOUTH EVERY DAY 90 tablet 2   clopidogrel (PLAVIX) 75 MG tablet Take 75 mg by mouth daily.     loratadine (CLARITIN) 10 MG tablet Take 10 mg by mouth every evening.     metoprolol tartrate (LOPRESSOR) 25 MG tablet Take 1 tablet (25 mg total) by mouth 2 (two) times daily. 60 tablet 11   Multiple Vitamin (MULTIVITAMIN WITH MINERALS) TABS tablet Take 1 tablet by mouth daily.     Multiple Vitamins-Minerals (PRESERVISION AREDS 2 PO) Take 1 capsule by mouth in the morning and at bedtime.     Omega-3 Fatty Acids (FISH OIL) 1000 MG CAPS Take 1,000-2,000 mg by mouth See admin instructions. Take 2000 mg by mouth in the evening and 1000 mg at bedtime     ondansetron (ZOFRAN-ODT) 4 MG disintegrating tablet Take 1 tablet (4 mg total) by mouth every 8 (eight) hours as needed for nausea or vomiting. 20 tablet 0   pantoprazole (PROTONIX) 40 MG tablet Take 40 mg by mouth daily before breakfast.     Polyvinyl Alcohol-Povidone (REFRESH OP) Place 1 drop into both eyes in the morning.     ramipril (ALTACE) 5 MG capsule Take 5 mg by mouth in the morning.     tamsulosin (FLOMAX) 0.4 MG CAPS capsule Take 1 capsule (0.4 mg total) by mouth daily for 5 days. 5 capsule 0   No current facility-administered medications for this visit.    Allergies: No Known Allergies  Past Medical History:  Diagnosis Date   Anginal pain (HCC)    Arthritis    "  probably"   BPH (benign prostatic hyperplasia)    Carotid artery occlusion    Coronary artery disease    Dyspnea    GERD (gastroesophageal reflux disease)    History of kidney stones    Hypertension    Kidney stones    Macular degeneration    Myocardial infarction [facility])    Nausea and vomiting 02/06/2016   Seasonal allergies    Past Surgical History:   Procedure Laterality Date   APPENDECTOMY     BACK SURGERY     CARDIAC CATHETERIZATION N/A 08/21/2016   Procedure: Left Heart Cath and Coronary Angiography;  Surgeon: Kathleene Hazel, MD;  Location: [facility] INVASIVE CV LAB;  Service: Cardiovascular;  Laterality: N/A;   CARDIAC CATHETERIZATION N/A 08/21/2016   Procedure: Coronary Stent Intervention;  Surgeon: Kathleene Hazel, MD;  Location: [facility] INVASIVE CV LAB;  Service: Cardiovascular;  Laterality: N/A;   CARDIAC SURGERY     quadruple bypass   CATARACT EXTRACTION W/ INTRAOCULAR LENS  IMPLANT, BILATERAL Bilateral    COLONOSCOPY     COLONOSCOPY WITH PROPOFOL N/A 03/05/2021   Procedure: COLONOSCOPY WITH PROPOFOL;  Surgeon: Dolores Frame, MD;  [facility];  Service: Gastroenterology;  Laterality: N/A;  11:00   CORONARY ARTERY BYPASS GRAFT N/A 10/03/2016   Procedure: CORONARY ARTERY BYPASS GRAFTING (CABG)x 4 with endoscopic harvesting of right saphenous vein ,LIMA-LAD SVG-DIAG SVG-OM SVG-RCA;  Surgeon: Alleen Borne, MD;  Location: [facility] OR;  Service: Open Heart Surgery;  Laterality: N/A;   EYE SURGERY     GAS INSERTION Right 02/05/2016   Procedure: INSERTION OF GAS-C3F8;  Surgeon: Sherrie George, MD;  Location: [facility] OR;  Service: Ophthalmology;  Laterality: Right;   HAND SURGERY Right    thumb; S/P "cut his arm"   LASER PHOTO ABLATION Right 02/05/2016   Procedure: LASER PHOTO ABLATION-HEADSCOPE LASER;  Surgeon: Sherrie George, MD;  Location: [facility] OR;  Service: Ophthalmology;  Laterality: Right;   LUMBAR DISC SURGERY  ~ 2002   POLYPECTOMY  03/05/2021   Procedure: POLYPECTOMY;  Surgeon: Marguerita Merles, Reuel Boom, MD;  [facility]: [facility];  Service: Gastroenterology;;  ascending colon, sigmoid colon   RETINAL DETACHMENT REPAIR W/ SCLERAL BUCKLE LE Right 02/05/2016   SCLERAL BUCKLE Right 02/05/2016   Procedure: SCLERAL BUCKLE;  Surgeon: Sherrie George, MD;  Location: [facility] OR;  Service: Ophthalmology;  Laterality: Right;    TEE WITHOUT CARDIOVERSION N/A 10/03/2016   Procedure: TRANSESOPHAGEAL ECHOCARDIOGRAM (TEE);  Surgeon: Alleen Borne, MD;  Location[facility] OR;  Service: Open Heart Surgery;  Laterality: N/A;   TONSILLECTOMY     TRANSCAROTID ARTERY REVASCULARIZATION  Right 12/19/2021   Procedure: Right Transcarotid Artery Revascularization;  Surgeon: Victorino Sparrow, MD;  Location: [facility] OR;  Service: Vascular;  Laterality: Right;   Family History  Problem Relation Age of Onset   Hypertension Mother    Congestive Heart Failure Father    Social History   Socioeconomic History   Marital status: Married    Spouse name: Not on file   Number of children: Not on file   Years of education: 12   Highest education level: Not on file  Occupational History   Not on file  Tobacco Use   Smoking status: Former    Current packs/day: 0.00    Average packs/day: 3.0 packs/day for 16.0 years (48.0 ttl pk-yrs)    Types: Cigarettes    Start date: 02/04/1963    Quit date: 02/04/1979    Years since quitting:  44.3   Smokeless tobacco: Never  Vaping Use   Vaping status: Never Used  Substance and Sexual Activity   Alcohol use: Yes    Comment: occassionally   Drug use: No   Sexual activity: Not Currently  Other Topics Concern   Not on file  Social History Narrative   Lives at home with his wife.   Step-children.   Right-handed.   3 cups caffeine per day.   Social Determinants of Health   Financial Resource Strain: Not on file  Food Insecurity: Not on file  Transportation Needs: Not on file  Physical Activity: Not on file  Stress: Not on file  Social Connections: Not on file  Intimate Partner Violence: Not on file    SUBJECTIVE  Review of Systems Constitutional: Patient ***denies any unintentional weight loss or change in strength lntegumentary: Patient ***denies any rashes or pruritus Eyes: Patient denies ***dry eyes ENT: Patient ***denies dry mouth Cardiovascular: Patient ***denies chest pain or  syncope Respiratory: Patient ***denies shortness of breath Gastrointestinal: Patient ***denies nausea, vomiting, constipation, or diarrhea Musculoskeletal: Patient ***denies muscle cramps or weakness Neurologic: Patient ***denies convulsions or seizures Psychiatric: Patient ***denies memory problems Allergic/Immunologic: Patient ***denies recent allergic reaction(s) Hematologic/Lymphatic: Patient denies bleeding tendencies Endocrine: Patient ***denies heat/cold intolerance  GU: As per HPI.  OBJECTIVE There were no vitals filed for this visit. There is no height or weight on file to calculate BMI.  Physical Examination Constitutional: ***No obvious distress; patient is ***non-toxic appearing  Cardiovascular: ***No visible lower extremity edema.  Respiratory: The patient does ***not have audible wheezing/stridor; respirations do ***not appear labored  Gastrointestinal: Abdomen ***non-distended Musculoskeletal: ***Normal ROM of UEs  Skin: ***No obvious rashes/open sores  Neurologic: CN 2-12 grossly ***intact Psychiatric: Answered questions ***appropriately with ***normal affect  Hematologic/Lymphatic/Immunologic: ***No obvious bruises or sites of spontaneous bleeding  UA: ***negative / *** WBC/hpf, *** RBC/hpf, bacteria (***) PVR: *** ml  ASSESSMENT No diagnosis found. *** ***We reviewed recent imaging results; ***  Advised adequate hydration and we discussed option to consider low oxalate diet given that calcium oxalate is the most common type of stone. Handout provided about stone prevention diet.  Will plan to follow up in ***6 months with ***KUB ***RUS for stone surveillance or sooner if needed.  Pt verbalized understanding and agreement. All questions were answered.   PLAN Advised the following: Adequate fluid intake (2-2.5 liters per day). Low oxalate diet. 3. ***No follow-ups on file.  No orders of the defined types were placed in this encounter.   It has been  explained that the patient is to follow regularly with their PCP in addition to all other providers involved in their care and to follow instructions provided by these respective offices. Patient advised to contact urology clinic if any urologic-pertaining questions, concerns, new symptoms or problems arise in the interim period.  There are no Patient Instructions on file for this visit.  Electronically signed by: Donnita Falls, MSN, FNP-C, CUNP 06/12/2023 12:17 PM

## 2023-06-15 ENCOUNTER — Ambulatory Visit (HOSPITAL_COMMUNITY)
Admission: RE | Admit: 2023-06-15 | Discharge: 2023-06-15 | Disposition: A | Discharge status: Home / Self Care | Payer: Medicare Other | Source: Ambulatory Visit | Attending: Urology | Admitting: Urology

## 2023-06-15 ENCOUNTER — Encounter: Payer: Self-pay | Admitting: Urology

## 2023-06-15 ENCOUNTER — Ambulatory Visit: Payer: Medicare Other | Admitting: Urology

## 2023-06-15 VITALS — BP 160/88 | HR 77 | Temp 97.0°F

## 2023-06-15 DIAGNOSIS — N200 Calculus of kidney: Secondary | ICD-10-CM | POA: Diagnosis not present

## 2023-06-15 DIAGNOSIS — N400 Enlarged prostate without lower urinary tract symptoms: Secondary | ICD-10-CM

## 2023-06-15 DIAGNOSIS — N201 Calculus of ureter: Secondary | ICD-10-CM | POA: Diagnosis not present

## 2023-06-15 LAB — MICROSCOPIC EXAMINATION
Bacteria, UA: NONE SEEN
RBC, Urine: NONE SEEN /HPF (ref 0–2)

## 2023-06-15 LAB — URINALYSIS, ROUTINE W REFLEX MICROSCOPIC
Bilirubin, UA: NEGATIVE
Ketones, UA: NEGATIVE
Nitrite, UA: NEGATIVE
Protein,UA: NEGATIVE
RBC, UA: NEGATIVE
Specific Gravity, UA: 1.02 (ref 1.005–1.030)
Urobilinogen, Ur: 0.2 mg/dL (ref 0.2–1.0)
pH, UA: 6 (ref 5.0–7.5)

## 2023-06-15 LAB — BLADDER SCAN AMB NON-IMAGING: Scan Result: 1

## 2023-06-15 NOTE — Patient Instructions (Signed)

## 2023-06-16 DIAGNOSIS — N23 Unspecified renal colic: Secondary | ICD-10-CM | POA: Diagnosis not present

## 2023-06-16 DIAGNOSIS — I10 Essential (primary) hypertension: Secondary | ICD-10-CM | POA: Diagnosis not present

## 2023-06-18 ENCOUNTER — Other Ambulatory Visit (HOSPITAL_COMMUNITY): Payer: Self-pay | Admitting: Internal Medicine

## 2023-06-18 DIAGNOSIS — K769 Liver disease, unspecified: Secondary | ICD-10-CM

## 2023-06-23 ENCOUNTER — Ambulatory Visit: Payer: Medicare Other | Admitting: Urology

## 2023-06-25 ENCOUNTER — Ambulatory Visit (HOSPITAL_COMMUNITY)
Admission: RE | Admit: 2023-06-25 | Discharge: 2023-06-25 | Disposition: A | Discharge status: Home / Self Care | Payer: Medicare Other | Source: Ambulatory Visit | Attending: Internal Medicine | Admitting: Internal Medicine

## 2023-06-25 DIAGNOSIS — K769 Liver disease, unspecified: Secondary | ICD-10-CM | POA: Diagnosis not present

## 2023-06-25 DIAGNOSIS — K449 Diaphragmatic hernia without obstruction or gangrene: Secondary | ICD-10-CM | POA: Diagnosis not present

## 2023-06-25 DIAGNOSIS — D1803 Hemangioma of intra-abdominal structures: Secondary | ICD-10-CM | POA: Diagnosis not present

## 2023-06-25 DIAGNOSIS — N200 Calculus of kidney: Secondary | ICD-10-CM | POA: Diagnosis not present

## 2023-06-25 DIAGNOSIS — K7689 Other specified diseases of liver: Secondary | ICD-10-CM | POA: Diagnosis not present

## 2023-06-25 MED ORDER — GADOBUTROL 1 MMOL/ML IV SOLN
7.0000 mL | Freq: Once | INTRAVENOUS | Status: AC | PRN
  Administered 2023-06-25: 7 mL via INTRAVENOUS

## 2023-06-26 ENCOUNTER — Telehealth: Payer: Self-pay

## 2023-06-26 NOTE — Telephone Encounter (Signed)
Patient is made aware and voiced understanding. 

## 2023-06-26 NOTE — Telephone Encounter (Signed)
-----   Message from Donnita Falls sent at 06/26/2023  8:30 AM EDT ----- Please let pt know KUB was normal - no stones visualized. Looks like he passed the right ureteral stone as we suspected. Thanks.

## 2023-06-29 ENCOUNTER — Encounter (INDEPENDENT_AMBULATORY_CARE_PROVIDER_SITE_OTHER): Payer: Medicare Other | Admitting: Ophthalmology

## 2023-06-29 DIAGNOSIS — H35033 Hypertensive retinopathy, bilateral: Secondary | ICD-10-CM

## 2023-06-29 DIAGNOSIS — H43813 Vitreous degeneration, bilateral: Secondary | ICD-10-CM | POA: Diagnosis not present

## 2023-06-29 DIAGNOSIS — H35372 Puckering of macula, left eye: Secondary | ICD-10-CM

## 2023-06-29 DIAGNOSIS — I10 Essential (primary) hypertension: Secondary | ICD-10-CM

## 2023-06-29 DIAGNOSIS — H338 Other retinal detachments: Secondary | ICD-10-CM | POA: Diagnosis not present

## 2023-06-29 DIAGNOSIS — H353132 Nonexudative age-related macular degeneration, bilateral, intermediate dry stage: Secondary | ICD-10-CM | POA: Diagnosis not present

## 2023-06-30 ENCOUNTER — Other Ambulatory Visit: Payer: Self-pay

## 2023-07-01 NOTE — Progress Notes (Signed)
Surgical Instructions   Your procedure is scheduled on Tuesday July 14, 2023. Report to [facility] Main Entrance "A" at [facility], then check in with the Admitting office. Any questions or running late day of surgery: call [phone]  Questions prior to your surgery date: call [phone], Monday-Friday, 8am-4pm. If you experience any cold or flu symptoms such as cough, fever, chills, shortness of breath, etc. between now and your scheduled surgery, please notify us at the above number.     Remember:  Do not eat after midnight the night before your surgery  You may drink clear liquids until 11:00 the morning of your surgery.   Clear liquids allowed are: Water, Non-Citrus Juices (without pulp), Carbonated Beverages, Clear Tea, Black Coffee Only (NO MILK, CREAM OR POWDERED CREAMER of any kind), and Gatorade.   Patient Instructions  The night before surgery:  No food after midnight. ONLY clear liquids after midnight  The day of surgery (if you do NOT have diabetes):  Drink ONE (1) Pre-Surgery Clear Ensure by 11:00 the morning of surgery. Drink in one sitting. Do not sip.  This drink was given to you during your hospital  pre-op appointment visit.  Nothing else to drink after completing the  Pre-Surgery Clear Ensure.          If you have questions, please contact your surgeon's office.    Take these medicines the morning of surgery with A SIP OF WATER  atorvastatin (LIPITOR)  metoprolol tartrate (LOPRESSOR)  pantoprazole (PROTONIX)  Polyvinyl Alcohol-Povidone (REFRESH OP)    May take these medicines IF NEEDED: ondansetron (ZOFRAN-ODT)    Follow your surgeon's instructions on when to stop Asprin and your clopidogrel (PLAVIX) .  If no instructions were given by your surgeon then you will need to call the office to get those instructions.     One week prior to surgery, STOP taking any Aleve, Naproxen, Ibuprofen, Motrin, Advil, Goody's, BC's, all herbal medications, fish oil,  and non-prescription vitamins.                     Do NOT Smoke (Tobacco/Vaping) for 24 hours prior to your procedure.  If you use a CPAP at night, you may bring your mask/headgear for your overnight stay.   You will be asked to remove any contacts, glasses, piercing's, hearing aid's, dentures/partials prior to surgery. Please bring cases for these items if needed.    Patients discharged the day of surgery will not be allowed to drive home, and someone needs to stay with them for 24 hours.  SURGICAL WAITING ROOM VISITATION Patients may have no more than 2 support people in the waiting area - these visitors may rotate.   Pre-op nurse will coordinate an appropriate time for 1 ADULT support person, who may not rotate, to accompany patient in pre-op.  Children under the age of 62 must have an adult with them who is not the patient and must remain in the main waiting area with an adult.  If the patient needs to stay at the hospital during part of their recovery, the visitor guidelines for inpatient rooms apply.  Please refer to the [facility] website for the visitor guidelines for any additional information.   If you received a COVID test during your pre-op visit  it is requested that you wear a mask when out in public, stay away from anyone that may not be feeling well and notify your surgeon if you develop symptoms. If you have been in  contact with anyone that has tested positive in the last 10 days please notify you surgeon.      Pre-operative 5 CHG Bathing Instructions   You can play a key role in reducing the risk of infection after surgery. Your skin needs to be as free of germs as possible. You can reduce the number of germs on your skin by washing with CHG (chlorhexidine gluconate) soap before surgery. CHG is an antiseptic soap that kills germs and continues to kill germs even after washing.   DO NOT use if you have an allergy to chlorhexidine/CHG or antibacterial soaps. If your  skin becomes reddened or irritated, stop using the CHG and notify one of our RNs at [phone].   Please shower with the CHG soap starting 4 days before surgery using the following schedule:     Please keep in mind the following:  DO NOT shave, including legs and underarms, starting the day of your first shower.   You may shave your face at any point before/day of surgery.  Place clean sheets on your bed the day you start using CHG soap. Use a clean washcloth (not used since being washed) for each shower. DO NOT sleep with pets once you start using the CHG.   CHG Shower Instructions:  Wash your face and private area with normal soap. If you choose to wash your hair, wash first with your normal shampoo.  After you use shampoo/soap, rinse your hair and body thoroughly to remove shampoo/soap residue.  Turn the water OFF and apply about 3 tablespoons (45 ml) of CHG soap to a CLEAN washcloth.  Apply CHG soap ONLY FROM YOUR NECK DOWN TO YOUR TOES (washing for 3-5 minutes)  DO NOT use CHG soap on face, private areas, open wounds, or sores.  Pay special attention to the area where your surgery is being performed.  If you are having back surgery, having someone wash your back for you may be helpful. Wait 2 minutes after CHG soap is applied, then you may rinse off the CHG soap.  Pat dry with a clean towel  Put on clean clothes/pajamas   If you choose to wear lotion, please use ONLY the CHG-compatible lotions on the back of this paper.   Additional instructions for the day of surgery: DO NOT APPLY any lotions, deodorants or cologne.   Do not bring valuables to the hospital. [facility] is not responsible for any belongings/valuables. Put on clean/comfortable clothes.  Please brush your teeth.  Ask your nurse before applying any prescription medications to the skin.     CHG Compatible Lotions   Aveeno Moisturizing lotion  Cetaphil Moisturizing Cream  Cetaphil Moisturizing Lotion   Clairol Herbal Essence Moisturizing Lotion, Dry Skin  Clairol Herbal Essence Moisturizing Lotion, Extra Dry Skin  Clairol Herbal Essence Moisturizing Lotion, Normal Skin  Curel Age Defying Therapeutic Moisturizing Lotion with Alpha Hydroxy  Curel Extreme Care Body Lotion  Curel Soothing Hands Moisturizing Hand Lotion  Curel Therapeutic Moisturizing Cream, Fragrance-Free  Curel Therapeutic Moisturizing Lotion, Fragrance-Free  Curel Therapeutic Moisturizing Lotion, Original Formula  Eucerin Daily Replenishing Lotion  Eucerin Dry Skin Therapy Plus Alpha Hydroxy Crme  Eucerin Dry Skin Therapy Plus Alpha Hydroxy Lotion  Eucerin Original Crme  Eucerin Original Lotion  Eucerin Plus Crme Eucerin Plus Lotion  Eucerin TriLipid Replenishing Lotion  Keri Anti-Bacterial Hand Lotion  Keri Deep Conditioning Original Lotion Dry Skin Formula Softly Scented  Keri Deep Conditioning Original Lotion, Fragrance Free Sensitive Skin Formula  Keri  Lotion Fast Absorbing Fragrance Free Sensitive Skin Formula  Keri Lotion Fast Absorbing Softly Scented Dry Skin Formula  Keri Original Lotion  Keri Skin Renewal Lotion Keri Silky Smooth Lotion  Keri Silky Smooth Sensitive Skin Lotion  Nivea Body Creamy Conditioning Oil  Nivea Body Extra Enriched Lotion  Nivea Body Original Lotion  Nivea Body Sheer Moisturizing Lotion Nivea Crme  Nivea Skin Firming Lotion  NutraDerm 30 Skin Lotion  NutraDerm Skin Lotion  NutraDerm Therapeutic Skin Cream  NutraDerm Therapeutic Skin Lotion  ProShield Protective Hand Cream  Provon moisturizing lotion  Please read over the following fact sheets that you were given.

## 2023-07-02 ENCOUNTER — Other Ambulatory Visit: Payer: Self-pay

## 2023-07-02 ENCOUNTER — Encounter (HOSPITAL_COMMUNITY): Payer: Self-pay

## 2023-07-02 ENCOUNTER — Encounter (HOSPITAL_COMMUNITY)
Admission: RE | Admit: 2023-07-02 | Discharge: 2023-07-02 | Disposition: A | Discharge status: Home / Self Care | Payer: Medicare Other | Source: Ambulatory Visit | Attending: Orthopaedic Surgery | Admitting: Orthopaedic Surgery

## 2023-07-02 VITALS — BP 143/75 | HR 65 | Temp 98.1°F | Resp 18 | Ht 67.5 in | Wt 209.9 lb

## 2023-07-02 DIAGNOSIS — Z01812 Encounter for preprocedural laboratory examination: Secondary | ICD-10-CM | POA: Insufficient documentation

## 2023-07-02 DIAGNOSIS — M1711 Unilateral primary osteoarthritis, right knee: Secondary | ICD-10-CM | POA: Insufficient documentation

## 2023-07-02 DIAGNOSIS — Z01818 Encounter for other preprocedural examination: Secondary | ICD-10-CM

## 2023-07-02 LAB — COMPREHENSIVE METABOLIC PANEL
ALT: 33 U/L (ref 0–44)
AST: 38 U/L (ref 15–41)
Albumin: 3.6 g/dL (ref 3.5–5.0)
Alkaline Phosphatase: 106 U/L (ref 38–126)
Anion gap: 6 (ref 5–15)
BUN: 11 mg/dL (ref 8–23)
CO2: 26 mmol/L (ref 22–32)
Calcium: 8.8 mg/dL — ABNORMAL LOW (ref 8.9–10.3)
Chloride: 107 mmol/L (ref 98–111)
Creatinine, Ser: 0.85 mg/dL (ref 0.61–1.24)
GFR, Estimated: >60 mL/min (ref >60)
Glucose, Bld: 99 mg/dL (ref 70–99)
Potassium: 4.2 mmol/L (ref 3.5–5.1)
Sodium: 139 mmol/L (ref 135–145)
Total Bilirubin: 1 mg/dL (ref 0.3–1.2)
Total Protein: 6.7 g/dL (ref 6.5–8.1)

## 2023-07-02 LAB — CBC
HCT: 41.7 % (ref 39.0–52.0)
Hemoglobin: 13.2 g/dL (ref 13.0–17.0)
MCH: 28.8 pg (ref 26.0–34.0)
MCHC: 31.7 g/dL (ref 30.0–36.0)
MCV: 91 fL (ref 80.0–100.0)
Platelets: 239 10*3/uL (ref 150–400)
RBC: 4.58 MIL/uL (ref 4.22–5.81)
RDW: 13.4 % (ref 11.5–15.5)
WBC: 7.8 10*3/uL (ref 4.0–10.5)
nRBC: 0 % (ref 0.0–0.2)

## 2023-07-02 LAB — SURGICAL PCR SCREEN
MRSA, PCR: NEGATIVE
Staphylococcus aureus: NEGATIVE

## 2023-07-02 NOTE — Progress Notes (Signed)
PCP - Dr. Lia Hopping Cardiologist - Dr. [name]  PPM/ICD - n/a  Chest x-ray - n/a EKG - 03/04/23 Stress Test - 08/21/16 ECHO - 02/25/22 Cardiac Cath - 08/21/16  Sleep Study - denies CPAP - denies  Blood Thinner Instructions: Plavix, pt reported that he was told to hold 7 days prior to surgery. Aspirin Instructions: Hold 7 days prior to surgery.  ERAS Protcol -Clear liquids until 1100 DOS PRE-SURGERY Ensure or G2- Ensure provided.  COVID TEST- n/a  Anesthesia review: Yes, cardiac hx.   Patient denies shortness of breath, fever, cough and chest pain at PAT appointment   All instructions explained to the patient, with a verbal understanding of the material. Patient agrees to go over the instructions while at home for a better understanding. Patient also instructed to self quarantine after being tested for COVID-19. The opportunity to ask questions was provided.

## 2023-07-03 NOTE — Anesthesia Preprocedure Evaluation (Addendum)
Anesthesia Evaluation  Patient identified by MRN, date of birth, ID band Patient awake    Reviewed: Allergy & Precautions, NPO status , Patient's Chart, lab work & pertinent test results, reviewed documented beta blocker date and time   Airway Mallampati: II  TM Distance: >3 FB Neck ROM: Full    Dental  (+) Dental Advisory Given, Upper Dentures   Pulmonary former smoker   Pulmonary exam normal breath sounds clear to auscultation       Cardiovascular hypertension, Pt. on home beta blockers and Pt. on medications + angina  + CAD, + Past MI, + Cardiac Stents, + CABG and + Peripheral Vascular Disease (right carotid artery stenting in March 2023)  Normal cardiovascular exam Rhythm:Regular Rate:Normal     Neuro/Psych CVA, Residual Symptoms  negative psych ROS   GI/Hepatic Neg liver ROS,GERD  Medicated,,  Endo/Other  Obesity   Renal/GU negative Renal ROS     Musculoskeletal  (+) Arthritis ,    Abdominal   Peds  Hematology  (+) Blood dyscrasia (Plavix)   Anesthesia Other Findings Day of surgery medications reviewed with the patient.  Reproductive/Obstetrics                             Anesthesia Physical Anesthesia Plan  ASA: 3  Anesthesia Plan: Spinal   Post-op Pain Management: Tylenol PO (pre-op)* and Regional block*   Induction: Intravenous  PONV Risk Score and Plan: 1 and TIVA  Airway Management Planned: Natural Airway and Simple Face Mask  Additional Equipment:   Intra-op Plan:   Post-operative Plan:   Informed Consent: I have reviewed the patients History and Physical, chart, labs and discussed the procedure including the risks, benefits and alternatives for the proposed anesthesia with the patient or authorized representative who has indicated his/her understanding and acceptance.     Dental advisory given  Plan Discussed with: CRNA, Anesthesiologist and  Surgeon  Anesthesia Plan Comments: (PAT note by Antionette Poles, PA-C:  72 y.o. male with   history of HTN, HLD and CAD. He was [facility] 2017 with an inferior STEMI. His sub-totally occluded RCA was treated with a bare metal stent. He was also found to have severe disease in the left main artery and Circumflex. He underwent 4V CABG (LIMA to LAD, SVG to Diagonal, SVG to OM, SVG to PDA) on 10/03/16 after he had completed 1 month of dual antiplatelet therapy post PCI/bare metal stent placement. LVEF 60-65% by echo November 2017. He had stroke-like symptoms in January 2023 and was seen by Neurology. Brain MRI with March 2023 and was found to have small cortically based infarcts. He was seen by Vascular surgery following finding of right carotid artery stenosis. He underwent right carotid artery stenting in March 2023. Cardiac monitor March 2023 with sinus with PVCs.   Last seen by cardiology APP Jacolyn Reedy, PA-C 03/04/23 and noted to be doing well at that time. Was walking 1 mile/day, limited by knee pain. No changes to management, 1 year followup recommended.  Last seen by vascular surgeon Dr. Karin Lieu 11/21/22. Duplex at that time showed widely patent stent and contralateral vessels. 1 year followup recommended.   Former smoker, 48 pack years, quit 1980.  GERD, on pantoprazole.   Pt reports he was instructed to hold Plavix 7 days.  Preop labs reviewed, unremarkable.  EKG 03/04/23: Sinus bradycardia. Rate 55. Nonspecific t wave abnormality.   Carotid duplex 11/19/22: Summary:  Right Carotid:  The ECA appears >50% stenosed. Patent stent with no stenosis.   Left Carotid: Velocities in the left ICA are consistent with a 1-39% stenosis.   Vertebrals:Bilateral vertebral arteries demonstrate antegrade flow.   Subclavians: Normal flow hemodynamics were seen in bilateral subclavian arteries.   TTE 02/25/22: 1. Left ventricular ejection fraction, by estimation, is 55 to 60%. The  left  ventricle has normal function. The left ventricle demonstrates  regional wall motion abnormalities (see scoring diagram/findings for  description). There is mild left ventricular  hypertrophy. Left ventricular diastolic parameters were normal.  2. Right ventricular systolic function is normal. The right ventricular  size is normal. There is normal pulmonary artery systolic pressure. The  estimated right ventricular systolic pressure is 26.6 mmHg.  3. Left atrial size was mildly dilated.  4. The mitral valve is grossly normal. Mild mitral valve regurgitation.  5. The aortic valve is tricuspid. Aortic valve regurgitation is not  visualized.  6. The inferior vena cava is normal in size with greater than 50%  respiratory variability, suggesting right atrial pressure of 3 mmHg.    )        Anesthesia Quick Evaluation

## 2023-07-03 NOTE — Progress Notes (Signed)
Anesthesia Chart Review:  72 y.o. male with   history of HTN, HLD and CAD. He was admitted to [facility] November 2017 with an inferior STEMI. His sub-totally occluded RCA was treated with a bare metal stent. He was also found to have severe disease in the left main artery and Circumflex. He underwent 4V CABG (LIMA to LAD, SVG to Diagonal, SVG to OM, SVG to PDA) on 10/03/16 after he had completed 1 month of dual antiplatelet therapy post PCI/bare metal stent placement. LVEF 60-65% by echo November 2017. He had stroke-like symptoms in January 2023 and was seen by Neurology. Brain MRI with March 2023 and was found to have small cortically based infarcts. He was seen by Vascular surgery following finding of right carotid artery stenosis. He underwent right carotid artery stenting in March 2023. Cardiac monitor March 2023 with sinus with PVCs.   Last seen by cardiology APP Jacolyn Reedy, PA-C 03/04/23 and noted to be doing well at that time. Was walking 1 mile/day, limited by knee pain. No changes to management, 1 year followup recommended.  Last seen by vascular surgeon Dr. Karin Lieu 11/21/22. Duplex at that time showed widely patent stent and contralateral vessels. 1 year followup recommended.   Former smoker, 48 pack years, quit 1980.  GERD, on pantoprazole.   Pt reports he was instructed to hold Plavix 7 days.  Preop labs reviewed, unremarkable.  EKG 03/04/23: Sinus bradycardia. Rate 55. Nonspecific t wave abnormality.   Carotid duplex 11/19/22: Summary:  Right Carotid: The ECA appears >50% stenosed. Patent stent with no stenosis.   Left Carotid: Velocities in the left ICA are consistent with a 1-39% stenosis.   Vertebrals: Bilateral vertebral arteries demonstrate antegrade flow.   Subclavians: Normal flow hemodynamics were seen in bilateral subclavian arteries.   TTE 02/25/22:  1. Left ventricular ejection fraction, by estimation, is 55 to 60%. The  left ventricle has normal function. The left  ventricle demonstrates  regional wall motion abnormalities (see scoring diagram/findings for  description). There is mild left ventricular   hypertrophy. Left ventricular diastolic parameters were normal.   2. Right ventricular systolic function is normal. The right ventricular  size is normal. There is normal pulmonary artery systolic pressure. The  estimated right ventricular systolic pressure is 26.6 mmHg.   3. Left atrial size was mildly dilated.   4. The mitral valve is grossly normal. Mild mitral valve regurgitation.   5. The aortic valve is tricuspid. Aortic valve regurgitation is not  visualized.   6. The inferior vena cava is normal in size with greater than 50%  respiratory variability, suggesting right atrial pressure of 3 mmHg.    Zannie C[facility] Short Stay Center/Anesthesiology Phone [phone] 07/03/2023 1:41 PM

## 2023-07-13 ENCOUNTER — Telehealth: Payer: Self-pay | Admitting: *Deleted

## 2023-07-13 NOTE — Telephone Encounter (Signed)
Ortho bundle pre-op call completed. 

## 2023-07-13 NOTE — Care Plan (Signed)
[facility] RNCM call to patient to discuss his upcoming Right total knee arthroplasty with Dr. Magnus Ivan. He is an Ortho bundle patient and agreeable to case management. He lives with his wife and will be returning home and she can assist with care. He will need a RW prior to discharge home. Anticipate HHPT will be needed after a short hospital stay. Referral made to [facility] after choice provided. Reviewed all post op care instructions. Will continue to follow for needs.

## 2023-07-13 NOTE — Telephone Encounter (Signed)
Attempted pre-op call to patient; no answer and left VM requesting call back.

## 2023-07-13 NOTE — H&P (Signed)
TOTAL KNEE ADMISSION H&P  Patient is being admitted for right total knee arthroplasty.  Subjective:  Chief Complaint:right knee pain.  HPI: Scott Jacobson, 72 y.o. male, has a history of pain and functional disability in the right knee due to arthritis and has failed non-surgical conservative treatments for greater than 12 weeks to includeNSAID's and/or analgesics, corticosteriod injections, and activity modification.  Onset of symptoms was abrupt, starting 1 years ago with gradually worsening course since that time. The patient noted no past surgery on the right knee(s).  Patient currently rates pain in the right knee(s) at 10 out of 10 with activity. Patient has night pain, worsening of pain with activity and weight bearing, pain that interferes with activities of daily living, pain with passive range of motion, and joint swelling.  Patient has evidence of subchondral sclerosis, periarticular osteophytes, joint space narrowing, and subchondral edema  by imaging studies. There is no active infection.  Patient Active Problem List   Diagnosis Date Noted   Kidney stones 06/12/2023   History of non-ST elevation myocardial infarction (NSTEMI) 06/12/2023   GERD (gastroesophageal reflux disease) 06/12/2023   Unilateral primary osteoarthritis, right knee 05/28/2023   Carotid stenosis 12/19/2021   Stroke (HCC) 10/01/2021   S/P CABG x 4 10/03/2016   Coronary artery disease involving native heart without angina pectoris 08/23/2016   HLD (hyperlipidemia) 08/23/2016   Macular degeneration    BPH (benign prostatic hyperplasia)    Rhegmatogenous retinal detachment of right eye 02/05/2016   Past Medical History:  Diagnosis Date   Anginal pain (HCC)    Arthritis    "probably"   BPH (benign prostatic hyperplasia)    Carotid artery occlusion    Coronary artery disease    Dyspnea    GERD (gastroesophageal reflux disease)    History of kidney stones    Hypertension    Kidney stones    Macular  degeneration    Myocardial infarction (HCC)    Nausea and vomiting 02/06/2016   Seasonal allergies     Past Surgical History:  Procedure Laterality Date   APPENDECTOMY     BACK SURGERY     CARDIAC CATHETERIZATION N/A 08/21/2016   Procedure: Left Heart Cath and Coronary Angiography;  Surgeon: Kathleene Hazel, MD;  Location: [facility] INVASIVE CV LAB;  Service: Cardiovascular;  Laterality: N/A;   CARDIAC CATHETERIZATION N/A 08/21/2016   Procedure: Coronary Stent Intervention;  Surgeon: Kathleene Hazel, MD;  Location: [facility] INVASIVE CV LAB;  Service: Cardiovascular;  Laterality: N/A;   CARDIAC SURGERY     quadruple bypass   CATARACT EXTRACTION W/ INTRAOCULAR LENS  IMPLANT, BILATERAL Bilateral    COLONOSCOPY     COLONOSCOPY WITH PROPOFOL N/A 03/05/2021   Procedure: COLONOSCOPY WITH PROPOFOL;  Surgeon: Dolores Frame, MD;  Location: [facility] ENDO SUITE;  Service: Gastroenterology;  Laterality: N/A;  11:00   CORONARY ARTERY BYPASS GRAFT N/A 10/03/2016   Procedure: CORONARY ARTERY BYPASS GRAFTING (CABG)x 4 with endoscopic harvesting of right saphenous vein ,LIMA-LAD SVG-DIAG SVG-OM SVG-RCA;  Surgeon: Alleen Borne, MD;  Location[facility] OR;  Service: Open Heart Surgery;  Laterality: N/A;   EYE SURGERY     GAS INSERTION Right 02/05/2016   Procedure: INSERTION OF GAS-C3F8;  Surgeon: Sherrie George, MD;  Location: [facility] OR;  Service: Ophthalmology;  Laterality: Right;   HAND SURGERY Right    thumb; S/P "cut his arm"   LASER PHOTO ABLATION Right 02/05/2016   Procedure: LASER PHOTO ABLATION-HEADSCOPE LASER;  Surgeon: Sherrie George,  MD;  Location: [facility] OR;  Service: Ophthalmology;  Laterality: Right;   LUMBAR DISC SURGERY  ~ 2002   POLYPECTOMY  03/05/2021   Procedure: POLYPECTOMY;  Surgeon: Marguerita Merles, Reuel Boom, MD;  Location: [facility] ENDO SUITE;  Service: Gastroenterology;;  ascending colon, sigmoid colon   RETINAL DETACHMENT REPAIR W/ SCLERAL BUCKLE LE Right 02/05/2016   SCLERAL BUCKLE Right  02/05/2016   Procedure: SCLERAL BUCKLE;  Surgeon: Sherrie George, MD;  Location: [facility] OR;  Service: Ophthalmology;  Laterality: Right;   TEE WITHOUT CARDIOVERSION N/A 10/03/2016   Procedure: TRANSESOPHAGEAL ECHOCARDIOGRAM (TEE);  Surgeon: Alleen Borne, MD;  Location: [facility] OR;  Service: Open Heart Surgery;  Laterality: N/A;   TONSILLECTOMY     TRANSCAROTID ARTERY REVASCULARIZATION  Right 12/19/2021   Procedure: Right Transcarotid Artery Revascularization;  Surgeon: Victorino Sparrow, MD;  Location: [facility] OR;  Service: Vascular;  Laterality: Right;    No current facility-administered medications for this encounter.   Current Outpatient Medications  Medication Sig Dispense Refill Last Dose   aspirin EC 81 MG tablet Take 81 mg by mouth daily. Swallow whole.      atorvastatin (LIPITOR) 40 MG tablet TAKE 1 TABLET BY MOUTH EVERY DAY 90 tablet 2    clopidogrel (PLAVIX) 75 MG tablet Take 75 mg by mouth daily.      loratadine (CLARITIN) 10 MG tablet Take 10 mg by mouth every evening.      metoprolol tartrate (LOPRESSOR) 25 MG tablet Take 1 tablet (25 mg total) by mouth 2 (two) times daily. 60 tablet 11    Multiple Vitamin (MULTIVITAMIN WITH MINERALS) TABS tablet Take 1 tablet by mouth daily.      Multiple Vitamins-Minerals (PRESERVISION AREDS 2 PO) Take 1 capsule by mouth in the morning and at bedtime.      Omega-3 Fatty Acids (FISH OIL) 1000 MG CAPS Take 1,000-2,000 mg by mouth See admin instructions. Take 2000 mg by mouth in the evening and 1000 mg at bedtime      pantoprazole (PROTONIX) 40 MG tablet Take 40 mg by mouth daily before breakfast.      Polyvinyl Alcohol-Povidone (REFRESH OP) Place 1 drop into both eyes in the morning.      ramipril (ALTACE) 5 MG capsule Take 5 mg by mouth in the morning.      ondansetron (ZOFRAN-ODT) 4 MG disintegrating tablet Take 1 tablet (4 mg total) by mouth every 8 (eight) hours as needed for nausea or vomiting. (Patient not taking: Reported on 07/01/2023) 20 tablet 0 Not  Taking   No Known Allergies  Social History   Tobacco Use   Smoking status: Former    Current packs/day: 0.00    Average packs/day: 3.0 packs/day for 16.0 years (48.0 ttl pk-yrs)    Types: Cigarettes    Start date: 02/04/1963    Quit date: 02/04/1979    Years since quitting: 44.4   Smokeless tobacco: Never  Substance Use Topics   Alcohol use: Yes    Comment: occassionally    Family History  Problem Relation Age of Onset   Hypertension Mother    Congestive Heart Failure Father      Review of Systems  Objective:  Physical Exam Vitals reviewed.  Constitutional:      Appearance: Normal appearance.  HENT:     Head: Normocephalic and atraumatic.  Eyes:     Extraocular Movements: Extraocular movements intact.     Pupils: Pupils are equal, round, and reactive to light.  Cardiovascular:  Rate and Rhythm: Normal rate.     Pulses: Normal pulses.  Pulmonary:     Effort: Pulmonary effort is normal.     Breath sounds: Normal breath sounds.  Abdominal:     Palpations: Abdomen is soft.  Musculoskeletal:     Cervical back: Normal range of motion and neck supple.     Right knee: Effusion and bony tenderness present. Decreased range of motion. Tenderness present over the medial joint line and lateral joint line. Abnormal meniscus.  Neurological:     Mental Status: He is alert and oriented to person, place, and time.  Psychiatric:        Behavior: Behavior normal.     Vital signs in last 24 hours:    Labs:   Estimated body mass index is 32.39 kg/m as calculated from the following:   Height as of 07/02/23: 5' 7.5" (1.715 m).   Weight as of 07/02/23: 95.2 kg.   Imaging Review Plain radiographs demonstrate severe degenerative joint disease of the right knee(s). The overall alignment isneutral. The bone quality appears to be good for age and reported activity level.      Assessment/Plan:  End stage arthritis, right knee   The patient history, physical examination,  clinical judgment of the provider and imaging studies are consistent with end stage degenerative joint disease of the right knee(s) and total knee arthroplasty is deemed medically necessary. The treatment options including medical management, injection therapy arthroscopy and arthroplasty were discussed at length. The risks and benefits of total knee arthroplasty were presented and reviewed. The risks due to aseptic loosening, infection, stiffness, patella tracking problems, thromboembolic complications and other imponderables were discussed. The patient acknowledged the explanation, agreed to proceed with the plan and consent was signed. Patient is being admitted for inpatient treatment for surgery, pain control, PT, OT, prophylactic antibiotics, VTE prophylaxis, progressive ambulation and ADL's and discharge planning. The patient is planning to be discharged home with home health services

## 2023-07-14 ENCOUNTER — Other Ambulatory Visit: Payer: Self-pay

## 2023-07-14 ENCOUNTER — Ambulatory Visit (HOSPITAL_COMMUNITY): Payer: Medicare Other | Admitting: Physician Assistant

## 2023-07-14 ENCOUNTER — Ambulatory Visit (HOSPITAL_BASED_OUTPATIENT_CLINIC_OR_DEPARTMENT_OTHER): Payer: Medicare Other | Admitting: Anesthesiology

## 2023-07-14 ENCOUNTER — Encounter (HOSPITAL_COMMUNITY): Payer: Self-pay | Admitting: Orthopaedic Surgery

## 2023-07-14 ENCOUNTER — Inpatient Hospital Stay (HOSPITAL_COMMUNITY)
Admission: RE | Admit: 2023-07-14 | Discharge: 2023-07-18 | DRG: 470 | Disposition: A | Discharge status: Home Health | Payer: Medicare Other | Attending: Orthopaedic Surgery | Admitting: Orthopaedic Surgery

## 2023-07-14 ENCOUNTER — Encounter (HOSPITAL_COMMUNITY)
Admission: RE | Disposition: A | Discharge status: Home Health | Payer: Self-pay | Source: Home / Self Care | Attending: Orthopaedic Surgery

## 2023-07-14 ENCOUNTER — Observation Stay (HOSPITAL_COMMUNITY): Payer: Medicare Other

## 2023-07-14 DIAGNOSIS — Z8673 Personal history of transient ischemic attack (TIA), and cerebral infarction without residual deficits: Secondary | ICD-10-CM

## 2023-07-14 DIAGNOSIS — Z471 Aftercare following joint replacement surgery: Secondary | ICD-10-CM | POA: Diagnosis not present

## 2023-07-14 DIAGNOSIS — I252 Old myocardial infarction: Secondary | ICD-10-CM

## 2023-07-14 DIAGNOSIS — M1711 Unilateral primary osteoarthritis, right knee: Secondary | ICD-10-CM | POA: Diagnosis not present

## 2023-07-14 DIAGNOSIS — Z7902 Long term (current) use of antithrombotics/antiplatelets: Secondary | ICD-10-CM

## 2023-07-14 DIAGNOSIS — I2510 Atherosclerotic heart disease of native coronary artery without angina pectoris: Secondary | ICD-10-CM

## 2023-07-14 DIAGNOSIS — Z8249 Family history of ischemic heart disease and other diseases of the circulatory system: Secondary | ICD-10-CM | POA: Diagnosis not present

## 2023-07-14 DIAGNOSIS — Z96651 Presence of right artificial knee joint: Secondary | ICD-10-CM | POA: Diagnosis not present

## 2023-07-14 DIAGNOSIS — Z79899 Other long term (current) drug therapy: Secondary | ICD-10-CM | POA: Diagnosis not present

## 2023-07-14 DIAGNOSIS — Z951 Presence of aortocoronary bypass graft: Secondary | ICD-10-CM

## 2023-07-14 DIAGNOSIS — G8918 Other acute postprocedural pain: Secondary | ICD-10-CM | POA: Diagnosis not present

## 2023-07-14 DIAGNOSIS — Z7982 Long term (current) use of aspirin: Secondary | ICD-10-CM

## 2023-07-14 DIAGNOSIS — Z9842 Cataract extraction status, left eye: Secondary | ICD-10-CM

## 2023-07-14 DIAGNOSIS — Z87891 Personal history of nicotine dependence: Secondary | ICD-10-CM

## 2023-07-14 DIAGNOSIS — I10 Essential (primary) hypertension: Secondary | ICD-10-CM | POA: Diagnosis not present

## 2023-07-14 DIAGNOSIS — M25562 Pain in left knee: Secondary | ICD-10-CM | POA: Diagnosis not present

## 2023-07-14 DIAGNOSIS — Z955 Presence of coronary angioplasty implant and graft: Secondary | ICD-10-CM

## 2023-07-14 DIAGNOSIS — K219 Gastro-esophageal reflux disease without esophagitis: Secondary | ICD-10-CM | POA: Diagnosis present

## 2023-07-14 DIAGNOSIS — Z87442 Personal history of urinary calculi: Secondary | ICD-10-CM | POA: Diagnosis not present

## 2023-07-14 DIAGNOSIS — M179 Osteoarthritis of knee, unspecified: Secondary | ICD-10-CM | POA: Diagnosis present

## 2023-07-14 DIAGNOSIS — Z9841 Cataract extraction status, right eye: Secondary | ICD-10-CM | POA: Diagnosis not present

## 2023-07-14 DIAGNOSIS — R609 Edema, unspecified: Secondary | ICD-10-CM | POA: Diagnosis not present

## 2023-07-14 DIAGNOSIS — E785 Hyperlipidemia, unspecified: Secondary | ICD-10-CM | POA: Diagnosis present

## 2023-07-14 DIAGNOSIS — Z961 Presence of intraocular lens: Secondary | ICD-10-CM | POA: Diagnosis present

## 2023-07-14 DIAGNOSIS — H3530 Unspecified macular degeneration: Secondary | ICD-10-CM | POA: Diagnosis not present

## 2023-07-14 DIAGNOSIS — N400 Enlarged prostate without lower urinary tract symptoms: Secondary | ICD-10-CM | POA: Diagnosis present

## 2023-07-14 HISTORY — PX: TOTAL KNEE ARTHROPLASTY: SHX125

## 2023-07-14 SURGERY — ARTHROPLASTY, KNEE, TOTAL
Anesthesia: Spinal | Site: Knee | Laterality: Right

## 2023-07-14 MED ORDER — METOCLOPRAMIDE HCL 5 MG PO TABS: 5.0000 mg | ORAL_TABLET | Freq: Three times a day (TID) | ORAL | Status: DC | PRN

## 2023-07-14 MED ORDER — CEFAZOLIN SODIUM-DEXTROSE 2-4 GM/100ML-% IV SOLN
2.0000 g | INTRAVENOUS | Status: AC
  Administered 2023-07-14: 2 g via INTRAVENOUS
  Filled 2023-07-14: qty 100

## 2023-07-14 MED ORDER — LORATADINE 10 MG PO TABS
10.0000 mg | ORAL_TABLET | Freq: Every evening | ORAL | Status: DC
  Administered 2023-07-14 – 2023-07-17 (×3): 10 mg via ORAL
  Filled 2023-07-14 (×3): qty 1

## 2023-07-14 MED ORDER — ATORVASTATIN CALCIUM 40 MG PO TABS
40.0000 mg | ORAL_TABLET | Freq: Every day | ORAL | Status: DC
  Administered 2023-07-15 – 2023-07-18 (×4): 40 mg via ORAL
  Filled 2023-07-14 (×4): qty 1

## 2023-07-14 MED ORDER — TRANEXAMIC ACID-NACL 1000-0.7 MG/100ML-% IV SOLN
1000.0000 mg | INTRAVENOUS | Status: AC
  Administered 2023-07-14: 1000 mg via INTRAVENOUS
  Filled 2023-07-14: qty 100

## 2023-07-14 MED ORDER — PHENYLEPHRINE 80 MCG/ML (10ML) SYRINGE FOR IV PUSH (FOR BLOOD PRESSURE SUPPORT)
PREFILLED_SYRINGE | INTRAVENOUS | Status: DC | PRN
  Administered 2023-07-14 (×4): 80 ug via INTRAVENOUS
  Administered 2023-07-14: 160 ug via INTRAVENOUS
  Administered 2023-07-14: 80 ug via INTRAVENOUS
  Administered 2023-07-14: 160 ug via INTRAVENOUS
  Administered 2023-07-14: 80 ug via INTRAVENOUS
  Administered 2023-07-14: 160 ug via INTRAVENOUS

## 2023-07-14 MED ORDER — MIDAZOLAM HCL 2 MG/2ML IJ SOLN
INTRAMUSCULAR | Status: DC | PRN
  Administered 2023-07-14: 2 mg via INTRAVENOUS

## 2023-07-14 MED ORDER — FENTANYL CITRATE (PF) 100 MCG/2ML IJ SOLN: 25.0000 ug | INTRAMUSCULAR | Status: DC | PRN

## 2023-07-14 MED ORDER — METOCLOPRAMIDE HCL 5 MG/ML IJ SOLN
5.0000 mg | Freq: Three times a day (TID) | INTRAMUSCULAR | Status: DC | PRN
  Administered 2023-07-15 – 2023-07-17 (×4): 10 mg via INTRAVENOUS
  Filled 2023-07-14 (×4): qty 2

## 2023-07-14 MED ORDER — ASPIRIN 81 MG PO TBEC
81.0000 mg | DELAYED_RELEASE_TABLET | Freq: Every day | ORAL | Status: DC
  Administered 2023-07-15 – 2023-07-18 (×4): 81 mg via ORAL
  Filled 2023-07-14 (×4): qty 1

## 2023-07-14 MED ORDER — FENTANYL CITRATE (PF) 250 MCG/5ML IJ SOLN
INTRAMUSCULAR | Status: AC
  Filled 2023-07-14: qty 5

## 2023-07-14 MED ORDER — DIPHENHYDRAMINE HCL 12.5 MG/5ML PO ELIX: 12.5000 mg | ORAL_SOLUTION | ORAL | Status: DC | PRN

## 2023-07-14 MED ORDER — CEFAZOLIN SODIUM-DEXTROSE 1-4 GM/50ML-% IV SOLN
1.0000 g | Freq: Four times a day (QID) | INTRAVENOUS | Status: AC
  Administered 2023-07-14 – 2023-07-15 (×2): 1 g via INTRAVENOUS
  Filled 2023-07-14 (×2): qty 50

## 2023-07-14 MED ORDER — FENTANYL CITRATE (PF) 100 MCG/2ML IJ SOLN
INTRAMUSCULAR | Status: AC
  Administered 2023-07-14: 50 ug via INTRAVENOUS
  Filled 2023-07-14: qty 2

## 2023-07-14 MED ORDER — ALUM & MAG HYDROXIDE-SIMETH 200-200-20 MG/5ML PO SUSP
30.0000 mL | ORAL | Status: DC | PRN
  Administered 2023-07-17: 30 mL via ORAL
  Filled 2023-07-14: qty 30

## 2023-07-14 MED ORDER — PHENYLEPHRINE 80 MCG/ML (10ML) SYRINGE FOR IV PUSH (FOR BLOOD PRESSURE SUPPORT)
PREFILLED_SYRINGE | INTRAVENOUS | Status: AC
  Filled 2023-07-14: qty 10

## 2023-07-14 MED ORDER — SODIUM CHLORIDE 0.9 % IV SOLN: INTRAVENOUS | Status: AC

## 2023-07-14 MED ORDER — PROPOFOL 1000 MG/100ML IV EMUL
INTRAVENOUS | Status: AC
  Filled 2023-07-14: qty 100

## 2023-07-14 MED ORDER — GLYCOPYRROLATE PF 0.2 MG/ML IJ SOSY
PREFILLED_SYRINGE | INTRAMUSCULAR | Status: DC | PRN
Start: 2023-07-14 — End: 2023-07-14
  Administered 2023-07-14: .1 mg via INTRAVENOUS

## 2023-07-14 MED ORDER — LACTATED RINGERS IV SOLN
INTRAVENOUS | Status: DC | PRN
Start: 2023-07-14 — End: 2023-07-14

## 2023-07-14 MED ORDER — MIDAZOLAM HCL 2 MG/2ML IJ SOLN
INTRAMUSCULAR | Status: AC
  Filled 2023-07-14: qty 2

## 2023-07-14 MED ORDER — HYDROMORPHONE HCL 1 MG/ML IJ SOLN
0.5000 mg | INTRAMUSCULAR | Status: DC | PRN
  Administered 2023-07-15: 1 mg via INTRAVENOUS
  Filled 2023-07-14: qty 1

## 2023-07-14 MED ORDER — FENTANYL CITRATE (PF) 100 MCG/2ML IJ SOLN: 50.0000 ug | Freq: Once | INTRAMUSCULAR | Status: AC

## 2023-07-14 MED ORDER — ACETAMINOPHEN 500 MG PO TABS
1000.0000 mg | ORAL_TABLET | Freq: Once | ORAL | Status: AC
  Administered 2023-07-14: 1000 mg via ORAL
  Filled 2023-07-14: qty 2

## 2023-07-14 MED ORDER — KETAMINE HCL 50 MG/5ML IJ SOSY
PREFILLED_SYRINGE | INTRAMUSCULAR | Status: AC
  Filled 2023-07-14: qty 5

## 2023-07-14 MED ORDER — ROPIVACAINE HCL 5 MG/ML IJ SOLN
INTRAMUSCULAR | Status: DC | PRN
  Administered 2023-07-14: 30 mL via PERINEURAL

## 2023-07-14 MED ORDER — PHENOL 1.4 % MT LIQD: 1.0000 | OROMUCOSAL | Status: DC | PRN

## 2023-07-14 MED ORDER — CHLORHEXIDINE GLUCONATE 0.12 % MT SOLN
15.0000 mL | OROMUCOSAL | Status: AC
  Administered 2023-07-14: 15 mL via OROMUCOSAL
  Filled 2023-07-14: qty 15

## 2023-07-14 MED ORDER — METHOCARBAMOL 1000 MG/10ML IJ SOLN: 500.0000 mg | Freq: Four times a day (QID) | INTRAVENOUS | Status: DC | PRN

## 2023-07-14 MED ORDER — ONDANSETRON HCL 4 MG PO TABS: 4.0000 mg | ORAL_TABLET | Freq: Four times a day (QID) | ORAL | Status: DC | PRN

## 2023-07-14 MED ORDER — FENTANYL CITRATE (PF) 250 MCG/5ML IJ SOLN
INTRAMUSCULAR | Status: DC | PRN
  Administered 2023-07-14: 50 ug via INTRAVENOUS

## 2023-07-14 MED ORDER — ONDANSETRON HCL 4 MG/2ML IJ SOLN
INTRAMUSCULAR | Status: AC
  Filled 2023-07-14: qty 2

## 2023-07-14 MED ORDER — DEXAMETHASONE SODIUM PHOSPHATE 10 MG/ML IJ SOLN
INTRAMUSCULAR | Status: DC | PRN
Start: 2023-07-14 — End: 2023-07-14
  Administered 2023-07-14: 10 mg

## 2023-07-14 MED ORDER — OXYCODONE HCL 5 MG PO TABS
10.0000 mg | ORAL_TABLET | ORAL | Status: DC | PRN
  Administered 2023-07-14: 15 mg via ORAL
  Filled 2023-07-14 (×2): qty 3

## 2023-07-14 MED ORDER — 0.9 % SODIUM CHLORIDE (POUR BTL) OPTIME
TOPICAL | Status: DC | PRN
  Administered 2023-07-14: 1000 mL

## 2023-07-14 MED ORDER — MENTHOL 3 MG MT LOZG: 1.0000 | LOZENGE | OROMUCOSAL | Status: DC | PRN

## 2023-07-14 MED ORDER — LACTATED RINGERS IV SOLN: Freq: Once | INTRAVENOUS | Status: AC

## 2023-07-14 MED ORDER — POVIDONE-IODINE 10 % EX SWAB: 2.0000 | Freq: Once | CUTANEOUS | Status: DC

## 2023-07-14 MED ORDER — BUPIVACAINE IN DEXTROSE 0.75-8.25 % IT SOLN
INTRATHECAL | Status: DC | PRN
Start: 2023-07-14 — End: 2023-07-14
  Administered 2023-07-14: 1.6 mL via INTRATHECAL

## 2023-07-14 MED ORDER — RAMIPRIL 5 MG PO CAPS
5.0000 mg | ORAL_CAPSULE | Freq: Every morning | ORAL | Status: DC
  Administered 2023-07-15 – 2023-07-18 (×4): 5 mg via ORAL
  Filled 2023-07-14 (×5): qty 1

## 2023-07-14 MED ORDER — ACETAMINOPHEN 325 MG PO TABS
325.0000 mg | ORAL_TABLET | Freq: Four times a day (QID) | ORAL | Status: DC | PRN
  Administered 2023-07-18: 650 mg via ORAL
  Filled 2023-07-14: qty 2

## 2023-07-14 MED ORDER — BUPIVACAINE-EPINEPHRINE (PF) 0.25% -1:200000 IJ SOLN
INTRAMUSCULAR | Status: DC | PRN
Start: 2023-07-14 — End: 2023-07-14
  Administered 2023-07-14: 30 mL

## 2023-07-14 MED ORDER — SODIUM CHLORIDE 0.9 % IR SOLN
Status: DC | PRN
Start: 2023-07-14 — End: 2023-07-14
  Administered 2023-07-14: 1000 mL

## 2023-07-14 MED ORDER — POLYVINYL ALCOHOL-POVIDONE PF 1.4-0.6 % OP SOLN: Freq: Every day | OPHTHALMIC | Status: DC

## 2023-07-14 MED ORDER — METOPROLOL TARTRATE 25 MG PO TABS
25.0000 mg | ORAL_TABLET | Freq: Two times a day (BID) | ORAL | Status: DC
  Administered 2023-07-14 – 2023-07-18 (×8): 25 mg via ORAL
  Filled 2023-07-14 (×2): qty 1
  Filled 2023-07-14: qty 2
  Filled 2023-07-14 (×5): qty 1

## 2023-07-14 MED ORDER — METHOCARBAMOL 500 MG PO TABS
500.0000 mg | ORAL_TABLET | Freq: Four times a day (QID) | ORAL | Status: DC | PRN
  Administered 2023-07-14 – 2023-07-15 (×2): 500 mg via ORAL
  Filled 2023-07-14 (×2): qty 1

## 2023-07-14 MED ORDER — CLOPIDOGREL BISULFATE 75 MG PO TABS
75.0000 mg | ORAL_TABLET | Freq: Every day | ORAL | Status: DC
  Administered 2023-07-15 – 2023-07-18 (×4): 75 mg via ORAL
  Filled 2023-07-14 (×4): qty 1

## 2023-07-14 MED ORDER — ONDANSETRON HCL 4 MG/2ML IJ SOLN
4.0000 mg | Freq: Four times a day (QID) | INTRAMUSCULAR | Status: DC | PRN
  Administered 2023-07-15 – 2023-07-16 (×3): 4 mg via INTRAVENOUS
  Filled 2023-07-14 (×3): qty 2

## 2023-07-14 MED ORDER — PANTOPRAZOLE SODIUM 40 MG PO TBEC
40.0000 mg | DELAYED_RELEASE_TABLET | Freq: Every day | ORAL | Status: DC
  Administered 2023-07-15 – 2023-07-18 (×4): 40 mg via ORAL
  Filled 2023-07-14 (×4): qty 1

## 2023-07-14 MED ORDER — ONDANSETRON HCL 4 MG/2ML IJ SOLN: 4.0000 mg | Freq: Once | INTRAMUSCULAR | Status: DC | PRN

## 2023-07-14 MED ORDER — POLYVINYL ALCOHOL 1.4 % OP SOLN: 1.0000 [drp] | OPHTHALMIC | Status: DC | PRN

## 2023-07-14 MED ORDER — DOCUSATE SODIUM 100 MG PO CAPS
100.0000 mg | ORAL_CAPSULE | Freq: Two times a day (BID) | ORAL | Status: DC
  Administered 2023-07-14 – 2023-07-18 (×8): 100 mg via ORAL
  Filled 2023-07-14 (×8): qty 1

## 2023-07-14 MED ORDER — OXYCODONE HCL 5 MG PO TABS
5.0000 mg | ORAL_TABLET | ORAL | Status: DC | PRN
  Administered 2023-07-15: 5 mg via ORAL

## 2023-07-14 MED ORDER — BUPIVACAINE-EPINEPHRINE (PF) 0.25% -1:200000 IJ SOLN
INTRAMUSCULAR | Status: AC
  Filled 2023-07-14: qty 30

## 2023-07-14 MED ORDER — ONDANSETRON HCL 4 MG/2ML IJ SOLN
INTRAMUSCULAR | Status: DC | PRN
  Administered 2023-07-14: 4 mg via INTRAVENOUS

## 2023-07-14 MED ORDER — GLYCOPYRROLATE PF 0.2 MG/ML IJ SOSY
PREFILLED_SYRINGE | INTRAMUSCULAR | Status: AC
  Filled 2023-07-14: qty 1

## 2023-07-14 MED ORDER — PROPOFOL 500 MG/50ML IV EMUL
INTRAVENOUS | Status: DC | PRN
  Administered 2023-07-14: 60 ug/kg/min via INTRAVENOUS

## 2023-07-14 SURGICAL SUPPLY — 73 items
BAG COUNTER SPONGE SURGICOUNT (BAG) ×1 IMPLANT
BAG SPNG CNTER NS LX DISP (BAG) ×3
BANDAGE ESMARK 6X9 LF (GAUZE/BANDAGES/DRESSINGS) ×1 IMPLANT
BLADE SAG 18X100X1.27 (BLADE) ×1 IMPLANT
BNDG CMPR 9X6 STRL LF SNTH (GAUZE/BANDAGES/DRESSINGS) ×1
BNDG CMPR MED 10X6 ELC LF (GAUZE/BANDAGES/DRESSINGS) ×1
BNDG ELASTIC 6X10 VLCR STRL LF (GAUZE/BANDAGES/DRESSINGS) IMPLANT
BNDG ELASTIC 6X5.8 VLCR STR LF (GAUZE/BANDAGES/DRESSINGS) ×2 IMPLANT
BNDG ESMARK 6X9 LF (GAUZE/BANDAGES/DRESSINGS) ×1
BOWL SMART MIX CTS (DISPOSABLE) IMPLANT
CEMENT BONE R 1X40 (Cement) IMPLANT
COMP PATELLA PEG 3 32 (Joint) ×1 IMPLANT
COMPONENT PATELLA PEG 3 32 (Joint) IMPLANT
COOLER ICEMAN CLASSIC (MISCELLANEOUS) ×1 IMPLANT
COVER SURGICAL LIGHT HANDLE (MISCELLANEOUS) ×1 IMPLANT
CUFF TOURN SGL QUICK 34 (TOURNIQUET CUFF) ×1
CUFF TOURN SGL QUICK 42 (TOURNIQUET CUFF) IMPLANT
CUFF TRNQT CYL 34X4.125X (TOURNIQUET CUFF) ×1 IMPLANT
DRAPE EXTREMITY T 121X128X90 (DISPOSABLE) ×1 IMPLANT
DRAPE HALF SHEET 40X57 (DRAPES) ×1 IMPLANT
DRAPE U-SHAPE 47X51 STRL (DRAPES) ×1 IMPLANT
DURAPREP 26ML APPLICATOR (WOUND CARE) ×1 IMPLANT
ELECT CAUTERY BLADE 6.4 (BLADE) ×1 IMPLANT
ELECT REM PT RETURN 9FT ADLT (ELECTROSURGICAL) ×1
ELECTRODE REM PT RTRN 9FT ADLT (ELECTROSURGICAL) ×1 IMPLANT
FACESHIELD WRAPAROUND (MASK) ×2 IMPLANT
FACESHIELD WRAPAROUND OR TEAM (MASK) ×2 IMPLANT
GAUZE PAD ABD 8X10 STRL (GAUZE/BANDAGES/DRESSINGS) ×1 IMPLANT
GAUZE SPONGE 4X4 12PLY STRL (GAUZE/BANDAGES/DRESSINGS) ×1 IMPLANT
GAUZE XEROFORM 1X8 LF (GAUZE/BANDAGES/DRESSINGS) ×1 IMPLANT
GAUZE XEROFORM 5X9 LF (GAUZE/BANDAGES/DRESSINGS) IMPLANT
GLOVE BIOGEL PI IND STRL 8 (GLOVE) ×2 IMPLANT
GLOVE ORTHO TXT STRL SZ7.5 (GLOVE) ×1 IMPLANT
GLOVE SURG ORTHO 8.0 STRL STRW (GLOVE) ×1 IMPLANT
GOWN STRL REUS W/ TWL LRG LVL3 (GOWN DISPOSABLE) IMPLANT
GOWN STRL REUS W/ TWL XL LVL3 (GOWN DISPOSABLE) ×2 IMPLANT
GOWN STRL REUS W/TWL LRG LVL3 (GOWN DISPOSABLE)
GOWN STRL REUS W/TWL XL LVL3 (GOWN DISPOSABLE) ×2
HANDPIECE INTERPULSE COAX TIP (DISPOSABLE) ×1
IMMOBILIZER KNEE 22 UNIV (SOFTGOODS) ×1 IMPLANT
INSERT TIB ARTISURF SZ8-11X12 (Insert) IMPLANT
IV NS 1000ML (IV SOLUTION) ×1
IV NS 1000ML BAXH (IV SOLUTION) ×1 IMPLANT
KIT BASIN OR (CUSTOM PROCEDURE TRAY) ×1 IMPLANT
KIT TURNOVER KIT B (KITS) ×1 IMPLANT
MANIFOLD NEPTUNE II (INSTRUMENTS) ×1 IMPLANT
NDL 18GX1X1/2 (RX/OR ONLY) (NEEDLE) IMPLANT
NEEDLE 18GX1X1/2 (RX/OR ONLY) (NEEDLE) IMPLANT
NS IRRIG 1000ML POUR BTL (IV SOLUTION) ×1 IMPLANT
PACK TOTAL JOINT (CUSTOM PROCEDURE TRAY) ×1 IMPLANT
PAD ARMBOARD 7.5X6 YLW CONV (MISCELLANEOUS) ×1 IMPLANT
PAD COLD SHLDR WRAP-ON (PAD) ×1 IMPLANT
PADDING CAST COTTON 6X4 STRL (CAST SUPPLIES) ×1 IMPLANT
PIN DRILL HDLS TROCAR 75 4PK (PIN) IMPLANT
PROS FEM KNEE PS STD 9 RT (Joint) ×1 IMPLANT
PROS TIB KNEE PS 0D F RT (Joint) ×1 IMPLANT
PROSTHESIS FEM KNEE PS STD9 RT (Joint) IMPLANT
PROSTHESIS TIB KNEE PS 0D F RT (Joint) IMPLANT
SCREW FEMALE HEX FIX 25X2.5 (ORTHOPEDIC DISPOSABLE SUPPLIES) IMPLANT
SET HNDPC FAN SPRY TIP SCT (DISPOSABLE) ×1 IMPLANT
SET PAD KNEE POSITIONER (MISCELLANEOUS) ×1 IMPLANT
STAPLER VISISTAT 35W (STAPLE) ×1 IMPLANT
SUCTION TUBE FRAZIER 10FR DISP (SUCTIONS) ×1 IMPLANT
SUT VIC AB 0 CT1 27 (SUTURE) ×1
SUT VIC AB 0 CT1 27XBRD ANBCTR (SUTURE) ×1 IMPLANT
SUT VIC AB 1 CT1 27 (SUTURE) ×2
SUT VIC AB 1 CT1 27XBRD ANBCTR (SUTURE) ×2 IMPLANT
SUT VIC AB 2-0 CT1 27 (SUTURE) ×2
SUT VIC AB 2-0 CT1 TAPERPNT 27 (SUTURE) ×2 IMPLANT
SYR 50ML LL SCALE MARK (SYRINGE) IMPLANT
TOWEL GREEN STERILE (TOWEL DISPOSABLE) ×1 IMPLANT
TOWEL GREEN STERILE FF (TOWEL DISPOSABLE) ×1 IMPLANT
TRAY CATH INTERMITTENT SS 16FR (CATHETERS) IMPLANT

## 2023-07-14 NOTE — Interval H&P Note (Signed)
History and Physical Interval Note: The patient understands that he is here today for a right total knee replacement to treat his severe right knee arthritis.  There has been no acute or interval change in his medical status.  The risks and benefits of surgery been discussed in detail and informed consent has been obtained.  The right operative knee has been marked.  07/14/2023 1:01 PM  Scott Jacobson  has presented today for surgery, with the diagnosis of osteoarthritis right knee.  The various methods of treatment have been discussed with the patient and family. After consideration of risks, benefits and other options for treatment, the patient has consented to  Procedure(s): RIGHT TOTAL KNEE ARTHROPLASTY (Right) as a surgical intervention.  The patient's history has been reviewed, patient examined, no change in status, stable for surgery.  I have reviewed the patient's chart and labs.  Questions were answered to the patient's satisfaction.     Kathryne Hitch

## 2023-07-14 NOTE — Op Note (Signed)
Operative Note  Date of operation: 07/14/2023 Preoperative diagnosis: Right knee primary osteoarthritis Postoperative diagnosis: Same  Procedure: Right press-fit total knee arthroplasty  Implants: Biomet/Zimmer press-fit persona knee system Implant Name Type Inv. Item Serial No. Manufacturer Lot No. LRB No. Used Action  INSERT TIB ARTISURF [id number] - [id number] Insert INSERT TIB ARTISURF [id number]  ZIMMER RECON(ORTH,TRAU,BIO,SG) [id number] Right 1 Implanted  COMP PATELLA PEG 3 32 - [id number] Joint COMP PATELLA PEG 3 32  ZIMMER RECON(ORTH,TRAU,BIO,SG) [id number] Right 1 Implanted  PROS TIB KNEE PS 0D F RT - [id number] Joint PROS TIB KNEE PS 0D F RT  ZIMMER RECON(ORTH,TRAU,BIO,SG) [id number] Right 1 Implanted  PROS FEM KNEE PS STD 9 RT - [id number] Joint PROS FEM KNEE PS STD 9 RT  ZIMMER RECON(ORTH,TRAU,BIO,SG) [id number] Right 1 Implanted   Surgeon: Vanita Panda. Magnus Ivan, MD Assistant: Rexene Edison, PA-C  Anesthesia: #1 right lower extremity adductor canal block, #2 spinal, #3 local Tourniquet time: Under 1 hour EBL: Less than 50 cc Antibiotics: 2 g IV Ancef Complications: None  Indications: The patient is a 72 year old gentleman with debilitating arthritis involving his right knee.  He has been hurting for over a year now.  The MRI of his right knee did show significant subchondral edema throughout the weightbearing surface of the medial femoral condyle as well as areas of full-thickness cartilage loss.  It also showed cartilage thinning throughout the knee so he recommended a total knee arthroplasty after the failure of conservative treatment and his continued pain.  At this point his right knee pain is daily and is detrimentally affecting his mobility, his quality of life and his activities of daily living.  We did discuss in length in detail the risk of acute blood loss anemia, nerve vessel injury, fracture, infection, DVT, implant failure and wound healing issues.  He understands that our  goals are hopefully decrease pain, improve mobility, and improve quality of life.  Procedure description: After informed consent was obtained and the appropriate right knee was marked, anesthesia obtained a right lower extremity adductor canal block in the holding room.  The patient was then brought to the operating room and set up on the operating table where spinal anesthesia was obtained.  He was then laid in supine position on the operating table and osteoarthritis placed around his upper right thigh.  His right thigh, knee, leg and ankle were prepped and draped with DuraPrep and sterile drapes in good a sterile stockinette.  A timeout was called and is identified as the correct patient the correct right knee.  We then used an Esmarch wrap out the leg and the tourniquet was plated to 300 mm of pressure.  With the knee extended a direct midline incision was made over the patella and carried this proximally distally.  Dissection was carried down to the knee joint and a medial parapatellar arthrotomy is made finding a very large joint effusion.  With the knee flexed we remove remnants of the ACL as well as medial and lateral meniscus.  We removed osteophytes from all 3 compartments.  We then used extramedullary cutting guide for making her proximal tibia cut correction for varus and valgus and a 7 degree slope.  We made this cut to take 2 mm off the low side and we backed this down to more millimeters.  We then moved to the femur with the knee in the flexed position with an intramedullary guide for distal femoral cutting guide setting this for right knee at 5 degrees externally rotated and a  10 mm distal femoral cut.  Made this cut without difficulty and brought the knee back down to full extension and achieved full extension with a 10 mm block.  We then impacted femur and put a femoral sizing guide based off the epicondylar axis.  Based off of this we chose a size 9 femur.  We put a 4-in-1 cutting block for a  size 9 femur and made our anterior and posterior cuts followed our chamfer cuts.  We then backed the tibia and chose a size F right tibial tray for coverage over the tibial plateau setting the rotation of the tibial tubercle and the femur.  We set its rotation appropriately and then made our drill hole and keel punch off of this finding good quality bone to proceed with press-fit implants.  We then trialed our size F right tibia with our size 9 right CR standard femur.  We placed a 10 mm medial congruent right fixed-bearing polythene insert 1 up to a 12 mm insert we are pleased with range of motion and stability without 12 mm insert.  We then made a patella cut and drilled 3 holes for a size 32 press-fit patella button.  Again with all instrumentation the knee we put his knee through several cycles of motion and we are pleased with range of motion and stability.  We then removed all traction mentation with the knee and irrigate the knee with normal saline solution.  We dried the knee roll well and placed our Marcaine with epinephrine around the arthrotomy.  With the knee in a flexed position we then press-fit our Biomet Zimmer persona tibial tray for right knee size F followed by press fitting our size 9 right CR standard femur.  We placed our 12 mm medial congruent right fixed-bearing polythene insert and press-fit our size 32 patella button.  We are pleased with range of motion and stability with those inserts.  We then let the tourniquet down and hemostasis was obtained with electrocautery.  The arthrotomy is closed with interrupted #1 Vicryl suture followed by 0 Vicryl close the deep tissue and 2-0 Vicryl close subcutaneous tissue.  The skin was closed staples.  Well-padded sterile dressing was applied.  The patient was taken the recovery room in stable condition.  Rexene Edison, PA-C did assist during entire case and beginning to end his assistance was medically necessary and crucial for soft tissue management and  retraction, helping guide implant placement and a layered closure of the wound.

## 2023-07-14 NOTE — Anesthesia Procedure Notes (Signed)
Spinal  Patient location during procedure: OR Start time: 07/14/2023 3:44 PM End time: 07/14/2023 3:47 PM Reason for block: surgical anesthesia Staffing Performed: anesthesiologist  Anesthesiologist: Collene Schlichter, MD Performed by: Collene Schlichter, MD Authorized by: Collene Schlichter, MD   Preanesthetic Checklist Completed: patient identified, IV checked, risks and benefits discussed, surgical consent, monitors and equipment checked, pre-op evaluation and timeout performed Spinal Block Patient position: sitting Prep: DuraPrep and site prepped and draped Patient monitoring: continuous pulse ox and blood pressure Approach: midline Location: L3-4 Injection technique: single-shot Needle Needle type: Pencan  Needle gauge: 24 G Assessment Events: CSF return Additional Notes Functioning IV was confirmed and monitors were applied. Sterile prep and drape, including hand hygiene, mask and sterile gloves were used. The patient was positioned and the spine was prepped. The skin was anesthetized with lidocaine.  Free flow of clear CSF was obtained prior to injecting local anesthetic into the CSF.  The spinal needle aspirated freely following injection.  The needle was carefully withdrawn.  The patient tolerated the procedure well. Consent was obtained prior to procedure with all questions answered and concerns addressed. Risks including but not limited to bleeding, infection, nerve damage, paralysis, failed block, inadequate analgesia, allergic reaction, high spinal, itching and headache were discussed and the patient wished to proceed.   Scott Aran, MD

## 2023-07-14 NOTE — Anesthesia Procedure Notes (Signed)
Anesthesia Regional Block: Adductor canal block   Pre-Anesthetic Checklist: , timeout performed,  Correct Patient, Correct Site, Correct Laterality,  Correct Procedure, Correct Position, site marked,  Risks and benefits discussed,  Surgical consent,  Pre-op evaluation,  At surgeon's request and post-op pain management  Laterality: Right  Prep: chloraprep       Needles:  Injection technique: Single-shot  Needle Type: Echogenic Needle     Needle Length: 9cm  Needle Gauge: 21     Additional Needles:   Procedures:,,,, ultrasound used (permanent image in chart),,    Narrative:  Start time: 07/14/2023 1:58 PM End time: 07/14/2023 2:05 PM Injection made incrementally with aspirations every 5 mL.  Performed by: Personally  Anesthesiologist: Collene Schlichter, MD  Additional Notes: No pain on injection. No increased resistance to injection. Injection made in 5cc increments.  Good needle visualization.  Patient tolerated procedure well.

## 2023-07-14 NOTE — Transfer of Care (Signed)
Immediate Anesthesia Transfer of Care Note  Patient: LOGYN KENDRICK  Procedure(s) Performed: RIGHT TOTAL KNEE ARTHROPLASTY (Right: Knee)  Patient Location: PACU  Anesthesia Type:MAC and Spinal  Level of Consciousness: awake, alert , and patient cooperative  Airway & Oxygen Therapy: Patient Spontanous Breathing  Post-op Assessment: Report given to RN and Post -op Vital signs reviewed and stable  Post vital signs: Reviewed  Last Vitals:  Vitals Value Taken Time  BP 101/66 07/14/23 1730  Temp    Pulse 71 07/14/23 1733  Resp 14 07/14/23 1733  SpO2 92 % 07/14/23 1733  Vitals shown include unfiled device data.  Last Pain:  Vitals:   07/14/23 1408  TempSrc:   PainSc: 0-No pain      Patients Stated Pain Goal: 0 (07/14/23 1229)  Complications: No notable events documented.

## 2023-07-15 ENCOUNTER — Encounter (HOSPITAL_COMMUNITY): Payer: Self-pay | Admitting: Orthopaedic Surgery

## 2023-07-15 LAB — CBC
HCT: 40.8 % (ref 39.0–52.0)
Hemoglobin: 13.7 g/dL (ref 13.0–17.0)
MCH: 30.4 pg (ref 26.0–34.0)
MCHC: 33.6 g/dL (ref 30.0–36.0)
MCV: 90.7 fL (ref 80.0–100.0)
Platelets: 242 10*3/uL (ref 150–400)
RBC: 4.5 MIL/uL (ref 4.22–5.81)
RDW: 13.6 % (ref 11.5–15.5)
WBC: 18.5 10*3/uL — ABNORMAL HIGH (ref 4.0–10.5)
nRBC: 0 % (ref 0.0–0.2)

## 2023-07-15 LAB — BASIC METABOLIC PANEL
Anion gap: 11 (ref 5–15)
BUN: 12 mg/dL (ref 8–23)
CO2: 23 mmol/L (ref 22–32)
Calcium: 8.8 mg/dL — ABNORMAL LOW (ref 8.9–10.3)
Chloride: 103 mmol/L (ref 98–111)
Creatinine, Ser: 0.82 mg/dL (ref 0.61–1.24)
GFR, Estimated: >60 mL/min (ref >60)
Glucose, Bld: 147 mg/dL — ABNORMAL HIGH (ref 70–99)
Potassium: 4 mmol/L (ref 3.5–5.1)
Sodium: 137 mmol/L (ref 135–145)

## 2023-07-15 MED ORDER — KETOROLAC TROMETHAMINE 15 MG/ML IJ SOLN
7.5000 mg | Freq: Four times a day (QID) | INTRAMUSCULAR | Status: AC
  Administered 2023-07-15 (×2): 7.5 mg via INTRAVENOUS
  Filled 2023-07-15 (×3): qty 1

## 2023-07-15 MED ORDER — METOCLOPRAMIDE HCL 5 MG/ML IJ SOLN
10.0000 mg | Freq: Once | INTRAMUSCULAR | Status: DC
  Filled 2023-07-15: qty 2

## 2023-07-15 NOTE — Anesthesia Postprocedure Evaluation (Signed)
Anesthesia Post Note  Patient: Scott Jacobson  Procedure(s) Performed: RIGHT TOTAL KNEE ARTHROPLASTY (Right: Knee)     Patient location during evaluation: PACU Anesthesia Type: Spinal and Regional Level of consciousness: awake Pain management: pain level controlled Vital Signs Assessment: post-procedure vital signs reviewed and stable Respiratory status: spontaneous breathing, nonlabored ventilation and respiratory function stable Cardiovascular status: blood pressure returned to baseline and stable Postop Assessment: no apparent nausea or vomiting Anesthetic complications: no   No notable events documented.  Last Vitals:  Vitals:   07/14/23 2050 07/15/23 0454  BP: (!) 158/82 (!) 143/71  Pulse: (!) 41 75  Resp: 16 16  Temp:  36.6 C  SpO2: 96% 96%    Last Pain:  Vitals:   07/15/23 0542  TempSrc:   PainSc: 1                  Vivan Vanderveer P Christ Fullenwider

## 2023-07-15 NOTE — Progress Notes (Addendum)
Subjective: 1 Day Post-Op Procedure(s) (LRB): RIGHT TOTAL KNEE ARTHROPLASTY (Right) Patient reports pain as 8 on 0-10 scale.  Nausea this AM. Ate [place] sandwich last night.   Objective: Vital signs in last 24 hours: Temp:  [97.8 F (36.6 C)-98.1 F (36.7 C)] 97.8 F (36.6 C) (10/09 0750) Pulse Rate:  [41-80] 80 (10/09 0750) Resp:  [11-19] 18 (10/09 0750) BP: (101-159)/(65-92) 150/83 (10/09 0750) SpO2:  [92 %-98 %] 96 % (10/09 0750) Weight:  [93 kg] 93 kg (10/08 1213)  Intake/Output from previous day: 10/08 0701 - 10/09 0700 In: 940 [P.O.:240; I.V.:700] Out: 1300 [Urine:1250; Blood:50] Intake/Output this shift: No intake/output data recorded.  Recent Labs    07/15/23 0616  HGB 13.7   Recent Labs    07/15/23 0616  WBC 18.5*  RBC 4.50  HCT 40.8  PLT 242   Recent Labs    07/15/23 0616  NA 137  K 4.0  CL 103  CO2 23  BUN 12  CREATININE 0.82  GLUCOSE 147*  CALCIUM 8.8*   No results for input(s): "LABPT", "INR" in the last 72 hours.  Dorsiflexion/Plantar flexion intact Incision: scant drainage Compartment soft Slightly Distended abdomen soft positive bowel sounds  Assessment/Plan: 1 Day Post-Op Procedure(s) (LRB): RIGHT TOTAL KNEE ARTHROPLASTY (Right) Up with therapy Resume Plavix and ASA Dressing changed Incentive spirometry each hour awake.    Berlie Hatchel 07/15/2023, 8:23 AM

## 2023-07-15 NOTE — Discharge Instructions (Signed)

## 2023-07-15 NOTE — Progress Notes (Signed)
    Durable Medical Equipment  (From admission, onward)           Start     Ordered   07/14/23 2054  DME 3 n 1  Once        07/14/23 2053   07/14/23 2054  DME Walker rolling  Once       Question Answer Comment  Walker: With 5 Inch Wheels   Patient needs a walker to treat with the following condition Status post total right knee replacement      07/14/23 2053

## 2023-07-15 NOTE — Care Management Obs Status (Signed)
MEDICARE OBSERVATION STATUS NOTIFICATION   Patient Details  Name: GROVE DEFINA MRN: [id number] Date of Birth: 1951-05-12   Medicare Observation Status Notification Given:  Yes    Lawerance Sabal, RN 07/15/2023, 8:33 AM

## 2023-07-15 NOTE — Progress Notes (Signed)
07/15/23 1300  PT Visit Information  Last PT Received On 07/15/23  Assistance Needed +1  History of Present Illness Pt admitted 10/8 for right TKA.PMH:  NSTEMI, CVA, CABG x 4,GERD, HLD  Subjective Data  Patient Stated Goal to go home  Precautions  Precautions Knee  Precaution Booklet Issued Yes (comment)  Required Braces or Orthoses Knee Immobilizer - Right  Knee Immobilizer - Right Discontinue once straight leg raise with < 10 degree lag  Restrictions  Weight Bearing Restrictions Yes  RLE Weight Bearing WBAT  Pain Assessment  Pain Assessment Faces  Faces Pain Scale 4  Pain Location right knee  Pain Descriptors / Indicators Aching;Discomfort;Grimacing;Guarding  Pain Intervention(s) Limited activity within patient's tolerance;Monitored during session;Repositioned;Ice applied  Cognition  Arousal Alert  Behavior During Therapy WFL for tasks assessed/performed  Overall Cognitive Status Within Functional Limits for tasks assessed  Bed Mobility  Overal bed mobility Needs Assistance  Bed Mobility Sit to Supine  Supine to sit Supervision  General bed mobility comments No assist needed just cues to use left LE under right LE to help back into bed.  Transfers  Overall transfer level Needs assistance  Equipment used Rolling walker (2 wheels)  Transfers Sit to/from Stand;Bed to chair/wheelchair/BSC  Sit to Stand Contact guard assist  General transfer comment Pt was able to come to standing with CGA assist to RW.  Cues for hand and foot placement.  Ambulation/Gait  Ambulation/Gait assistance Min assist  Gait Distance (Feet) 125 Feet  Assistive device Rolling walker (2 wheels)  Gait Pattern/deviations Step-to pattern;Decreased step length - right;Decreased stance time - right;Decreased weight shift to right;Knee flexed in stance - right;Antalgic  General Gait Details Pt was able to incr ambulation this pm with use of RW. Needed cues to use RW for sequencing steps and RW. Pt progressing  well and learning to use RW to unweight right LE.Pt did get a bit fatigued however was able to ambulate with RW with overall good safety awareness and good technique.  Gait velocity interpretation <1.31 ft/sec, indicative of household ambulator  Balance  Overall balance assessment Needs assistance  Sitting-balance support No upper extremity supported;Feet supported  Sitting balance-Leahy Scale Fair  Standing balance support Bilateral upper extremity supported;During functional activity  Standing balance-Leahy Scale Poor  Standing balance comment relies on UE support for balance and min assist for balance  Exercises  Exercises Total Joint;Other exercises  Total Joint Exercises  Ankle Circles/Pumps AROM;Both;10 reps;Supine  Quad Sets AROM;Both;10 reps;Supine  Other Exercises  Other Exercises Worked on increasing knee extension with towel roll under pts heel  PT - End of Session  Equipment Utilized During Treatment Gait belt;Right knee immobilizer  Activity Tolerance Patient tolerated treatment well  Patient left with call bell/phone within reach;in bed;with family/visitor present  Nurse Communication Mobility status   PT - Assessment/Plan  PT Visit Diagnosis Unsteadiness on feet (R26.81);Muscle weakness (generalized) (M62.81);Pain  Pain - Right/Left Right  Pain - part of body Knee  PT Frequency (ACUTE ONLY) 7X/week  Follow Up Recommendations Outpatient PT  Patient can return home with the following A little help with walking and/or transfers;A little help with bathing/dressing/bathroom;Assistance with cooking/housework;Assist for transportation;Help with stairs or ramp for entrance  PT equipment Rolling walker (2 wheels)  AM-PAC PT "6 Clicks" Mobility Outcome Measure (Version 2)  Help needed turning from your back to your side while in a flat bed without using bedrails? 3  Help needed moving from lying on your back to sitting on the side  of a flat bed without using bedrails? 3  Help  needed moving to and from a bed to a chair (including a wheelchair)? 3  Help needed standing up from a chair using your arms (e.g., wheelchair or bedside chair)? 3  Help needed to walk in hospital room? 3  Help needed climbing 3-5 steps with a railing?  3  6 Click Score 18  Consider Recommendation of Discharge To: Home with [facility]  Progressive Mobility  What is the highest level of mobility based on the progressive mobility assessment? Level 5 (Walks with assist in room/hall) - Balance while stepping forward/back and can walk in room with assist - Complete  Mobility Referral Yes  Activity Ambulated with assistance in hallway  PT Goal Progression  Progress towards PT goals Progressing toward goals  PT Time Calculation  PT Start Time (ACUTE ONLY) 1207  PT Stop Time (ACUTE ONLY) 1233  PT Time Calculation (min) (ACUTE ONLY) 26 min  PT General Charges  $$ ACUTE PT VISIT 1 Visit  PT Treatments  $Gait Training 8-22 mins  $Therapeutic Exercise 8-22 mins  Pt progressing well. Wife present and assisted pt with ambulation.  Pt working on knee extension on right.  Continue to progress pt.  Scott Jacobson M,PT Acute Rehab Services [phone]

## 2023-07-15 NOTE — Evaluation (Signed)
Physical Therapy Evaluation Patient Details Name: Scott STAGGERS MRN: [id number] DOB: September 27, 1951 Today's Date: 07/15/2023  History of Present Illness  Pt admitted 10/8 for right TKA.PMH:  NSTEMI, CVA, CABG x 4,GERD, HLD  Clinical Impression  Pt admitted with above diagnosis. Pt was limited a little by nausea.  Was able to participate in TKA exercises as well as OOB to chair with min assist.  Should progress well.  Pt currently with functional limitations due to the deficits listed below (see PT Problem List). Pt will benefit from acute skilled PT to increase their independence and safety with mobility to allow discharge.          If plan is discharge home, recommend the following: A little help with walking and/or transfers;A little help with bathing/dressing/bathroom;Assistance with cooking/housework;Assist for transportation;Help with stairs or ramp for entrance   Can travel by private vehicle        Equipment Recommendations Rolling walker (2 wheels)  Recommendations for Other Services       Functional Status Assessment Patient has had a recent decline in their functional status and demonstrates the ability to make significant improvements in function in a reasonable and predictable amount of time.     Precautions / Restrictions Precautions Precautions: Knee Precaution Booklet Issued: Yes (comment) Required Braces or Orthoses: Knee Immobilizer - Right Knee Immobilizer - Right: Discontinue once straight leg raise with < 10 degree lag Restrictions Weight Bearing Restrictions: Yes RLE Weight Bearing: Weight bearing as tolerated      Mobility  Bed Mobility Overal bed mobility: Needs Assistance Bed Mobility: Supine to Sit     Supine to sit: Min assist     General bed mobility comments: A little assist for LE to EOB.    Transfers Overall transfer level: Needs assistance Equipment used: Rolling walker (2 wheels) Transfers: Sit to/from Stand, Bed to  chair/wheelchair/BSC Sit to Stand: Min assist Stand pivot transfers: Min assist         General transfer comment: Pt was able to come to standing with min assist to RW.  Cues for hand and foot placement.  Pt nauseated therefore chose to only get from bed to chair this am.  Needed min assist and cues to step around to chair.    Ambulation/Gait                  Stairs            Wheelchair Mobility     Tilt Bed    Modified Rankin (Stroke Patients Only)       Balance Overall balance assessment: Needs assistance Sitting-balance support: No upper extremity supported, Feet supported Sitting balance-Leahy Scale: Fair     Standing balance support: Bilateral upper extremity supported, During functional activity Standing balance-Leahy Scale: Poor Standing balance comment: relies on UE support for balance and min assist for balance                             Pertinent Vitals/Pain Pain Assessment Pain Assessment: Faces Faces Pain Scale: Hurts whole lot Pain Location: right knee Pain Descriptors / Indicators: Aching, Discomfort, Grimacing, Guarding Pain Intervention(s): Limited activity within patient's tolerance, Monitored during session, Premedicated before session, Repositioned, Ice applied    Home Living Family/patient expects to be discharged to:: Private residence Living Arrangements: Spouse/significant other Available Help at Discharge: Family;Available 24 hours/day Type of Home: House Home Access: Stairs to enter Entrance Stairs-Rails: Right;Left;Can reach both Entrance Progress Energy  of Steps: 1   Home Layout: One level Home Equipment: Cane - single point;BSC/3in1;Shower seat      Prior Function Prior Level of Function : Driving;Independent/Modified Independent             Mobility Comments: used cane at times PTA per pt       Extremity/Trunk Assessment   Upper Extremity Assessment Upper Extremity Assessment: Defer to OT  evaluation    Lower Extremity Assessment Lower Extremity Assessment: RLE deficits/detail RLE Deficits / Details: grossly 2+/5 RLE: Unable to fully assess due to pain RLE Sensation: WNL    Cervical / Trunk Assessment Cervical / Trunk Assessment: Normal  Communication   Communication Communication: No apparent difficulties  Cognition Arousal: Alert Behavior During Therapy: WFL for tasks assessed/performed Overall Cognitive Status: Within Functional Limits for tasks assessed                                          General Comments      Exercises Total Joint Exercises Ankle Circles/Pumps: AROM, Both, 10 reps, Supine Quad Sets: AROM, Both, 10 reps, Supine Heel Slides: AAROM, Both, 10 reps, Supine Straight Leg Raises: AAROM, Right, 10 reps, Supine Long Arc Quad: AROM, Right, 10 reps, Supine Knee Flexion: AAROM, Right, 5 reps, Seated Goniometric ROM: 5-86 degrees   Assessment/Plan    PT Assessment Patient needs continued PT services  PT Problem List Decreased activity tolerance;Decreased range of motion;Decreased strength;Decreased balance;Decreased mobility;Decreased knowledge of use of DME;Decreased safety awareness;Decreased knowledge of precautions;Pain       PT Treatment Interventions DME instruction;Gait training;Functional mobility training;Therapeutic activities;Therapeutic exercise;Balance training;Patient/family education;Stair training    PT Goals (Current goals can be found in the Care Plan section)  Acute Rehab PT Goals Patient Stated Goal: to go home PT Goal Formulation: With patient Time For Goal Achievement: 07/29/23 Potential to Achieve Goals: Good    Frequency 7X/week     Co-evaluation               AM-PAC PT "6 Clicks" Mobility  Outcome Measure Help needed turning from your back to your side while in a flat bed without using bedrails?: A Little Help needed moving from lying on your back to sitting on the side of a flat bed  without using bedrails?: A Little Help needed moving to and from a bed to a chair (including a wheelchair)?: A Little Help needed standing up from a chair using your arms (e.g., wheelchair or bedside chair)?: A Little Help needed to walk in hospital room?: A Little Help needed climbing 3-5 steps with a railing? : A Little 6 Click Score: 18    End of Session Equipment Utilized During Treatment: Gait belt;Right knee immobilizer Activity Tolerance: Patient tolerated treatment well Patient left: in chair;with call bell/phone within reach;with chair alarm set Nurse Communication: Mobility status (nurse brought nausea meds) PT Visit Diagnosis: Unsteadiness on feet (R26.81);Muscle weakness (generalized) (M62.81);Pain Pain - Right/Left: Right Pain - part of body: Knee    Time: 1610-9604 PT Time Calculation (min) (ACUTE ONLY): 29 min   Charges:   PT Evaluation $PT Eval Moderate Complexity: 1 Mod PT Treatments $Therapeutic Exercise: 8-22 mins PT General Charges $$ ACUTE PT VISIT: 1 Visit         Debbi Strandberg M,PT Acute Rehab Services [phone]   Bevelyn Buckles 07/15/2023, 1:05 PM

## 2023-07-16 ENCOUNTER — Telehealth: Payer: Self-pay

## 2023-07-16 ENCOUNTER — Inpatient Hospital Stay (HOSPITAL_COMMUNITY): Payer: Medicare Other

## 2023-07-16 DIAGNOSIS — Z87442 Personal history of urinary calculi: Secondary | ICD-10-CM | POA: Diagnosis not present

## 2023-07-16 DIAGNOSIS — I2510 Atherosclerotic heart disease of native coronary artery without angina pectoris: Secondary | ICD-10-CM | POA: Diagnosis present

## 2023-07-16 DIAGNOSIS — N400 Enlarged prostate without lower urinary tract symptoms: Secondary | ICD-10-CM | POA: Diagnosis present

## 2023-07-16 DIAGNOSIS — Z955 Presence of coronary angioplasty implant and graft: Secondary | ICD-10-CM | POA: Diagnosis not present

## 2023-07-16 DIAGNOSIS — Z79899 Other long term (current) drug therapy: Secondary | ICD-10-CM | POA: Diagnosis not present

## 2023-07-16 DIAGNOSIS — I10 Essential (primary) hypertension: Secondary | ICD-10-CM | POA: Diagnosis present

## 2023-07-16 DIAGNOSIS — E785 Hyperlipidemia, unspecified: Secondary | ICD-10-CM | POA: Diagnosis present

## 2023-07-16 DIAGNOSIS — I517 Cardiomegaly: Secondary | ICD-10-CM | POA: Diagnosis not present

## 2023-07-16 DIAGNOSIS — Z8249 Family history of ischemic heart disease and other diseases of the circulatory system: Secondary | ICD-10-CM | POA: Diagnosis not present

## 2023-07-16 DIAGNOSIS — Z951 Presence of aortocoronary bypass graft: Secondary | ICD-10-CM | POA: Diagnosis not present

## 2023-07-16 DIAGNOSIS — Z961 Presence of intraocular lens: Secondary | ICD-10-CM | POA: Diagnosis present

## 2023-07-16 DIAGNOSIS — Z7982 Long term (current) use of aspirin: Secondary | ICD-10-CM | POA: Diagnosis not present

## 2023-07-16 DIAGNOSIS — Z7902 Long term (current) use of antithrombotics/antiplatelets: Secondary | ICD-10-CM | POA: Diagnosis not present

## 2023-07-16 DIAGNOSIS — M25562 Pain in left knee: Secondary | ICD-10-CM | POA: Diagnosis present

## 2023-07-16 DIAGNOSIS — Z87891 Personal history of nicotine dependence: Secondary | ICD-10-CM | POA: Diagnosis not present

## 2023-07-16 DIAGNOSIS — M1711 Unilateral primary osteoarthritis, right knee: Secondary | ICD-10-CM | POA: Diagnosis present

## 2023-07-16 DIAGNOSIS — Z9842 Cataract extraction status, left eye: Secondary | ICD-10-CM | POA: Diagnosis not present

## 2023-07-16 DIAGNOSIS — K219 Gastro-esophageal reflux disease without esophagitis: Secondary | ICD-10-CM | POA: Diagnosis present

## 2023-07-16 DIAGNOSIS — Z96651 Presence of right artificial knee joint: Secondary | ICD-10-CM

## 2023-07-16 DIAGNOSIS — I252 Old myocardial infarction: Secondary | ICD-10-CM | POA: Diagnosis not present

## 2023-07-16 DIAGNOSIS — Z8673 Personal history of transient ischemic attack (TIA), and cerebral infarction without residual deficits: Secondary | ICD-10-CM | POA: Diagnosis not present

## 2023-07-16 DIAGNOSIS — R112 Nausea with vomiting, unspecified: Secondary | ICD-10-CM | POA: Diagnosis not present

## 2023-07-16 DIAGNOSIS — Z9841 Cataract extraction status, right eye: Secondary | ICD-10-CM | POA: Diagnosis not present

## 2023-07-16 DIAGNOSIS — H3530 Unspecified macular degeneration: Secondary | ICD-10-CM | POA: Diagnosis present

## 2023-07-16 LAB — CBC
HCT: 40.6 % (ref 39.0–52.0)
Hemoglobin: 13.3 g/dL (ref 13.0–17.0)
MCH: 30 pg (ref 26.0–34.0)
MCHC: 32.8 g/dL (ref 30.0–36.0)
MCV: 91.6 fL (ref 80.0–100.0)
Platelets: 211 10*3/uL (ref 150–400)
RBC: 4.43 MIL/uL (ref 4.22–5.81)
RDW: 13.7 % (ref 11.5–15.5)
WBC: 16.7 10*3/uL — ABNORMAL HIGH (ref 4.0–10.5)
nRBC: 0 % (ref 0.0–0.2)

## 2023-07-16 LAB — BASIC METABOLIC PANEL
Anion gap: 9 (ref 5–15)
BUN: 14 mg/dL (ref 8–23)
CO2: 24 mmol/L (ref 22–32)
Calcium: 8.2 mg/dL — ABNORMAL LOW (ref 8.9–10.3)
Chloride: 103 mmol/L (ref 98–111)
Creatinine, Ser: 0.82 mg/dL (ref 0.61–1.24)
GFR, Estimated: >60 mL/min (ref >60)
Glucose, Bld: 177 mg/dL — ABNORMAL HIGH (ref 70–99)
Potassium: 3.8 mmol/L (ref 3.5–5.1)
Sodium: 136 mmol/L (ref 135–145)

## 2023-07-16 MED ORDER — PROMETHAZINE HCL 12.5 MG PO TABS: 12.5000 mg | ORAL_TABLET | Freq: Four times a day (QID) | ORAL | Status: DC | PRN

## 2023-07-16 MED ORDER — SODIUM CHLORIDE 0.9 % IV BOLUS
500.0000 mL | Freq: Once | INTRAVENOUS | Status: AC
  Administered 2023-07-16: 500 mL via INTRAVENOUS

## 2023-07-16 MED ORDER — SODIUM CHLORIDE 0.9 % IV SOLN
INTRAVENOUS | Status: DC
  Administered 2023-07-17: 250 mL via INTRAVENOUS

## 2023-07-16 MED ORDER — HYDROCODONE-ACETAMINOPHEN 5-325 MG PO TABS
1.0000 | ORAL_TABLET | ORAL | Status: DC | PRN
  Administered 2023-07-16 – 2023-07-17 (×3): 1 via ORAL
  Filled 2023-07-16 (×3): qty 1

## 2023-07-16 MED ORDER — METOCLOPRAMIDE HCL 5 MG/ML IJ SOLN
20.0000 mg | Freq: Once | INTRAVENOUS | Status: AC
  Administered 2023-07-16: 20 mg via INTRAVENOUS
  Filled 2023-07-16: qty 4

## 2023-07-16 NOTE — Plan of Care (Signed)
  Problem: Pain Management: Goal: Pain level will decrease with appropriate interventions Outcome: Progressing   Problem: Skin Integrity: Goal: Will show signs of wound healing Outcome: Progressing   

## 2023-07-16 NOTE — Progress Notes (Signed)
Patient ID: Scott Jacobson, male   DOB: 1950-11-21, 72 y.o.   MRN: [id number] The patient may be developing a postoperative ileus.  It is clinically prudent and medically necessary to give him a normal saline IV bolus and put him on a normal saline rate of 100 cc an hour given his nausea and vomiting.  A KUB has been ordered.  He may end up needing an NG tube.

## 2023-07-16 NOTE — Progress Notes (Signed)
Patient ID: Scott Jacobson, male   DOB: Feb 12, 1951, 72 y.o.   MRN: [id number] The patient is still having a hard time with nausea and vomiting as well as his mobility.  Therapy has not been able to work with him as much due to this situation.  His abdomen is distended but he said his Normal state in terms of his abdomen.  I will switch him to an inpatient admission since he cannot go home today and will try combination of Reglan and Phenergan for his nausea.  I am also going to change from oxycodone to Phenergan.  Hopefully he will do better over the next 24 hours for potential discharge to home tomorrow versus Saturday.

## 2023-07-16 NOTE — Progress Notes (Signed)
Physical Therapy Treatment Patient Details Name: Scott Jacobson MRN: [id number] DOB: 1951/06/16 Today's Date: 07/16/2023   History of Present Illness Pt admitted 10/8 for right TKA.PMH:  NSTEMI, CVA, CABG x 4,GERD, HLD    PT Comments  Pt in bed upon arrival with wife present. Pt reported intermittent n/v this afternoon, however, was agreeable to PT session. Pt was able to transfer supine/sit with MinA to take pain meds sitting EOB. Pt then had episode of emesis with RN notified. Pt was able to clean self and sit EOB with supervision to recover. After nausea subsided, pt was agreeable to ambulate in the room with CGA for safety. Pt was able to take steps without knee immobilizer with no episodes of knee buckling or instability. Limited session today to allow pt to recover from frequent n/v episodes. Acute PT to follow.      If plan is discharge home, recommend the following: A little help with walking and/or transfers;A little help with bathing/dressing/bathroom;Assistance with cooking/housework;Assist for transportation;Help with stairs or ramp for entrance     Equipment Recommendations  Rolling walker (2 wheels)       Precautions / Restrictions Precautions Precautions: Knee Precaution Booklet Issued: Yes (comment) Required Braces or Orthoses: Knee Immobilizer - Right Knee Immobilizer - Right: Discontinue once straight leg raise with < 10 degree lag Restrictions Weight Bearing Restrictions: Yes RLE Weight Bearing: Weight bearing as tolerated     Mobility  Bed Mobility Overal bed mobility: Needs Assistance Bed Mobility: Supine to Sit     Supine to sit: Min assist     General bed mobility comments: MinA with 1 HH support to assist with elevation of trunk, light assist to move R LE towards EOB    Transfers Overall transfer level: Needs assistance Equipment used: Rolling walker (2 wheels) Transfers: Sit to/from Stand (X2) Sit to Stand: Min assist, Contact guard assist (MinA for  initial STS, CGA for 2nd STS)           General transfer comment: MinA for initial sit to stand for initial rise, subsequent stand required CGA for safety. Cues for hand and foot placement.    Ambulation/Gait Ambulation/Gait assistance: Contact guard assist Gait Distance (Feet): 25 Feet (x20, x5) Assistive device: Rolling walker (2 wheels) Gait Pattern/deviations: Step-to pattern, Decreased step length - right, Decreased stance time - right, Decreased weight shift to right, Knee flexed in stance - right, Antalgic Gait velocity: dec     General Gait Details: distance limited in session due to n/v. Good follow through from prior sessions to sequence steps. Did not ambulate with knee immobilizer, pt with no episodes of knee buckling or instability         Balance Overall balance assessment: Needs assistance Sitting-balance support: No upper extremity supported, Feet supported Sitting balance-Leahy Scale: Fair     Standing balance support: Bilateral upper extremity supported, During functional activity Standing balance-Leahy Scale: Poor Standing balance comment: reliant on RW for stability         Cognition Arousal: Alert Behavior During Therapy: WFL for tasks assessed/performed Overall Cognitive Status: Within Functional Limits for tasks assessed           Exercises  Did not take knee ROM measurements due to n/v episodes    General Comments General comments (skin integrity, edema, etc.): session limited to due to n/v, pt able to tolerate short ambulation distance      Pertinent Vitals/Pain Pain Assessment Pain Assessment: Faces Faces Pain Scale: Hurts a little bit Pain  Location: right knee Pain Descriptors / Indicators: Aching, Discomfort, Grimacing, Guarding Pain Intervention(s): Limited activity within patient's tolerance, Premedicated before session, Monitored during session, Repositioned    Home Living Family/patient expects to be discharged to:: Private  residence Living Arrangements: Spouse/significant other Available Help at Discharge: Family;Available 24 hours/day Type of Home: House Home Access: Stairs to enter Entrance Stairs-Rails: Right;Left;Can reach both Entrance Stairs-Number of Steps: 1   Home Layout: One level Home Equipment: Cane - single point;BSC/3in1;Shower seat      Prior Function            PT Goals (current goals can now be found in the care plan section) Acute Rehab PT Goals Patient Stated Goal: to go home PT Goal Formulation: With patient Time For Goal Achievement: 07/29/23 Potential to Achieve Goals: Good Progress towards PT goals: Progressing toward goals    Frequency    7X/week       AM-PAC PT "6 Clicks" Mobility   Outcome Measure  Help needed turning from your back to your side while in a flat bed without using bedrails?: A Little Help needed moving from lying on your back to sitting on the side of a flat bed without using bedrails?: A Little Help needed moving to and from a bed to a chair (including a wheelchair)?: A Little Help needed standing up from a chair using your arms (e.g., wheelchair or bedside chair)?: A Little Help needed to walk in hospital room?: A Little Help needed climbing 3-5 steps with a railing? : A Little 6 Click Score: 18    End of Session Equipment Utilized During Treatment: Gait belt Activity Tolerance: Other (comment) (limited by n/v) Patient left: in chair;with call bell/phone within reach;with family/visitor present Nurse Communication: Mobility status (RN present during session) PT Visit Diagnosis: Unsteadiness on feet (R26.81);Muscle weakness (generalized) (M62.81);Pain Pain - Right/Left: Right Pain - part of body: Knee     Time: 1355-1427 PT Time Calculation (min) (ACUTE ONLY): 32 min  Charges:    $Gait Training: 8-22 mins $Therapeutic Exercise: 23-37 mins $Therapeutic Activity: 8-22 mins PT General Charges $$ ACUTE PT VISIT: 1 Visit                      Hilton Cork, PT, DPT Secure Chat Preferred  Rehab Office [phone]    Arturo Morton Brion Aliment 07/16/2023, 3:23 PM

## 2023-07-16 NOTE — Telephone Encounter (Signed)
Scott Jacobson at [facility] called stating that patient is nauseous and had vomited when sitting up to work with physical therapy.  She stated that it was black liquid and coffee ground.  Patient had RT TKA on 07/14/2023.  Please advise.  CB# [phone].

## 2023-07-16 NOTE — Progress Notes (Signed)
Physical Therapy Treatment Patient Details Name: Scott Jacobson MRN: [id number] DOB: 1951/09/06 Today's Date: 07/16/2023   History of Present Illness Pt admitted 10/8 for right TKA.PMH:  NSTEMI, CVA, CABG x 4,GERD, HLD    PT Comments  The pt was received in supine in bed with both pt and his wife voicing concerns about pt's position in bed given he has been vomiting this morning. RN aware and bringing nausea meds which were delivered mid-session with minimal change in pt's nausea. The pt was assisted to chair position in bed, but unable to tolerate OOB mobility this morning. Used session to go over bed-level exercises to increase activation of R quad and increase ROM in RLE with good tolerance. Will return later this afternoon to attempt to progress OOB mobility as pt is able to tolerate.     If plan is discharge home, recommend the following: A little help with walking and/or transfers;A little help with bathing/dressing/bathroom;Assistance with cooking/housework;Assist for transportation;Help with stairs or ramp for entrance   Can travel by private vehicle        Equipment Recommendations  Rolling walker (2 wheels)    Recommendations for Other Services       Precautions / Restrictions Precautions Precautions: Knee Precaution Booklet Issued: Yes (comment) Required Braces or Orthoses: Knee Immobilizer - Right Knee Immobilizer - Right: Discontinue once straight leg raise with < 10 degree lag Restrictions Weight Bearing Restrictions: Yes RLE Weight Bearing: Weight bearing as tolerated     Mobility  Bed Mobility Overal bed mobility: Needs Assistance             General bed mobility comments: assist of 2 to safely boost pt up in bed, pt decliend OOB mobility due to nausea. able to pull forwards on bed rails to reposition trunk from long-sitting                   Balance Overall balance assessment: Needs assistance Sitting-balance support: No upper extremity supported,  Feet supported Sitting balance-Leahy Scale: Fair                                      Cognition Arousal: Alert Behavior During Therapy: WFL for tasks assessed/performed Overall Cognitive Status: Within Functional Limits for tasks assessed                                          Exercises Total Joint Exercises Ankle Circles/Pumps: AROM, Both, Supine, 20 reps Quad Sets: AROM, Both, 10 reps, Supine, Limitations (2 x 10) Quad Sets Limitations: limited activation in R quad despite tactile cues and various verbal cues Short Arc Quad: AAROM, Right, 10 reps, Supine Heel Slides: AAROM, Both, 10 reps, Supine Hip ABduction/ADduction: AAROM, Right, 10 reps, Supine Straight Leg Raises: AAROM, Right, 10 reps, Supine (2 x 10)    General Comments General comments (skin integrity, edema, etc.): limited due to nausea, RN present and medicating during session, pt left with emesis bag and clean gown      Pertinent Vitals/Pain Pain Assessment Pain Assessment: Faces Faces Pain Scale: Hurts even more Pain Location: right knee Pain Descriptors / Indicators: Aching, Discomfort, Grimacing, Guarding Pain Intervention(s): Limited activity within patient's tolerance, Monitored during session, Repositioned, Ice applied, RN gave pain meds during session    Home Living Family/patient expects to  be discharged to:: Private residence Living Arrangements: Spouse/significant other Available Help at Discharge: Family;Available 24 hours/day Type of Home: House Home Access: Stairs to enter Entrance Stairs-Rails: Right;Left;Can reach both Entrance Stairs-Number of Steps: 1   Home Layout: One level Home Equipment: Cane - single point;BSC/3in1;Shower seat      Prior Function            PT Goals (current goals can now be found in the care plan section) Acute Rehab PT Goals Patient Stated Goal: to go home PT Goal Formulation: With patient Time For Goal Achievement:  07/29/23 Potential to Achieve Goals: Good Progress towards PT goals: Not progressing toward goals - comment (nausea)    Frequency    7X/week       AM-PAC PT "6 Clicks" Mobility   Outcome Measure  Help needed turning from your back to your side while in a flat bed without using bedrails?: A Little Help needed moving from lying on your back to sitting on the side of a flat bed without using bedrails?: A Little Help needed moving to and from a bed to a chair (including a wheelchair)?: A Little Help needed standing up from a chair using your arms (e.g., wheelchair or bedside chair)?: A Little Help needed to walk in hospital room?: A Little Help needed climbing 3-5 steps with a railing? : A Little 6 Click Score: 18    End of Session   Activity Tolerance: Other (comment) (nausea and vomiting) Patient left: in bed;with bed alarm set;with family/visitor present Nurse Communication: Mobility status (nurse brought nausea meds) PT Visit Diagnosis: Unsteadiness on feet (R26.81);Muscle weakness (generalized) (M62.81);Pain Pain - Right/Left: Right Pain - part of body: Knee     Time: 6213-0865 PT Time Calculation (min) (ACUTE ONLY): 37 min  Charges:    $Therapeutic Exercise: 23-37 mins PT General Charges $$ ACUTE PT VISIT: 1 Visit                     Vickki Muff, PT, DPT   Acute Rehabilitation Department Office [phone] Secure Chat Communication Preferred   Ronnie Derby 07/16/2023, 12:27 PM

## 2023-07-16 NOTE — Plan of Care (Signed)

## 2023-07-16 NOTE — Progress Notes (Signed)
Patient vomited ( black liquid, with coffee ground consistency) while working with PT. PRN antiemetics and pain med given prior to Therapy, MD made aware.

## 2023-07-17 MED ORDER — TRAMADOL HCL 50 MG PO TABS: 50.0000 mg | ORAL_TABLET | Freq: Four times a day (QID) | ORAL | Status: DC | PRN

## 2023-07-17 NOTE — Progress Notes (Signed)
Physical Therapy Treatment Patient Details Name: Scott Jacobson MRN: [id number] DOB: 1951-05-30 Today's Date: 07/17/2023   History of Present Illness Pt admitted 10/8 for right TKA.PMH:  NSTEMI, CVA, CABG x 4,GERD, HLD    PT Comments  Pt in bed upon arrival with wife present. Pt was agreeable to PT session and eager for mobility. Focused on LE ROM and strength as well as increasing functional mobility. Pt improved in ability to perform bed mobility with less assistance needed. Pt continues to need MinA for initial sit to stand, progressing to CGA after ambulating. Pt was able to walk in the hallway ~120 feet with CGA and step to gait pattern. Pt is progressing well towards goals. Acute PT to follow.    If plan is discharge home, recommend the following: A little help with walking and/or transfers;A little help with bathing/dressing/bathroom;Assistance with cooking/housework;Assist for transportation;Help with stairs or ramp for entrance     Equipment Recommendations  Rolling walker (2 wheels)       Precautions / Restrictions Precautions Precautions: Knee Precaution Booklet Issued: Yes (comment) Required Braces or Orthoses: Knee Immobilizer - Right Knee Immobilizer - Right: Discontinue once straight leg raise with < 10 degree lag Restrictions Weight Bearing Restrictions: Yes RLE Weight Bearing: Weight bearing as tolerated     Mobility  Bed Mobility Overal bed mobility: Needs Assistance Bed Mobility: Supine to Sit     Supine to sit: Contact guard, HOB elevated, Used rails     General bed mobility comments: Increased time to move R LE towards EOB, pt able to perform transfer with HOB elevated and rail    Transfers Overall transfer level: Needs assistance Equipment used: Rolling walker (2 wheels) Transfers: Sit to/from Stand (x2) Sit to Stand: Min assist, Contact guard assist (MinA for initial STS, CGA for 2nd STS)    General transfer comment: MinA from bed for initial rise,  multiple rocks to [employer]. CGA for 2nd transfer after ambulating    Ambulation/Gait Ambulation/Gait assistance: Contact guard assist Gait Distance (Feet): 120 Feet Assistive device: Rolling walker (2 wheels) Gait Pattern/deviations: Step-to pattern, Decreased step length - right, Decreased stance time - right, Decreased weight shift to right, Antalgic Gait velocity: dec     General Gait Details: step to gait pattern with RW, increased step length with increased distance         Balance Overall balance assessment: Needs assistance Sitting-balance support: No upper extremity supported, Feet supported Sitting balance-Leahy Scale: Fair     Standing balance support: Bilateral upper extremity supported, During functional activity Standing balance-Leahy Scale: Poor Standing balance comment: reliant on RW for stability       Cognition Arousal: Alert Behavior During Therapy: WFL for tasks assessed/performed Overall Cognitive Status: Within Functional Limits for tasks assessed         Exercises Total Joint Exercises Quad Sets: AROM, Both, 10 reps, Supine Heel Slides: AAROM, Right, 10 reps, Supine, Seated, AROM (AROM in sitting, AAROM in supine)    General Comments General comments (skin integrity, edema, etc.): VSS on RA, no n/v in today's session      Pertinent Vitals/Pain Pain Assessment Pain Assessment: Faces Faces Pain Scale: Hurts a little bit Pain Location: right knee Pain Descriptors / Indicators: Aching, Discomfort, Grimacing, Guarding     PT Goals (current goals can now be found in the care plan section) Acute Rehab PT Goals Patient Stated Goal: to go home PT Goal Formulation: With patient Time For Goal Achievement: 07/29/23 Potential to Achieve Goals:  Good    Frequency    7X/week          AM-PAC PT "6 Clicks" Mobility   Outcome Measure  Help needed turning from your back to your side while in a flat bed without using bedrails?: A  Little Help needed moving from lying on your back to sitting on the side of a flat bed without using bedrails?: A Little Help needed moving to and from a bed to a chair (including a wheelchair)?: A Little Help needed standing up from a chair using your arms (e.g., wheelchair or bedside chair)?: A Little Help needed to walk in hospital room?: A Little Help needed climbing 3-5 steps with a railing? : A Little 6 Click Score: 18    End of Session Equipment Utilized During Treatment: Gait belt Activity Tolerance: Patient tolerated treatment well Patient left: in chair;with call bell/phone within reach;with family/visitor present Nurse Communication: Mobility status PT Visit Diagnosis: Unsteadiness on feet (R26.81);Muscle weakness (generalized) (M62.81);Pain Pain - Right/Left: Right Pain - part of body: Knee     Time: 1610-9604 PT Time Calculation (min) (ACUTE ONLY): 32 min  Charges:    $Gait Training: 8-22 mins $Therapeutic Exercise: 8-22 mins PT General Charges $$ ACUTE PT VISIT: 1 Visit                     Hilton Cork, PT, DPT Secure Chat Preferred  Rehab Office [phone]    Arturo Morton [facility]ion [facility] 07/17/2023, 2:30 PM

## 2023-07-17 NOTE — Progress Notes (Signed)
Physical Therapy Treatment Patient Details Name: Scott Jacobson MRN: [id number] DOB: Jul 09, 1951 Today's Date: 07/17/2023   History of Present Illness Pt admitted 10/8 for right TKA.PMH:  NSTEMI, CVA, CABG x 4,GERD, HLD    PT Comments  Pt in recliner upon arrival with wife present. Pt agreeable to PT session. Focused on negotiating steps and LE strength in today's session. Pt continues to require MinA for the initial rise when standing. Pt was able to ascend/descend one step with the RW for three reps with CGA. Pt improved in ability to perform quad contraction and AROM exercises. Continues to be limited with knee flexion ROM. Pt is progressing well towards goals. Acute PT to follow.     If plan is discharge home, recommend the following: A little help with walking and/or transfers;A little help with bathing/dressing/bathroom;Assistance with cooking/housework;Assist for transportation;Help with stairs or ramp for entrance     Equipment Recommendations  Rolling walker (2 wheels)       Precautions / Restrictions Precautions Precautions: Knee Precaution Booklet Issued: Yes (comment) Required Braces or Orthoses: Knee Immobilizer - Right Knee Immobilizer - Right: Discontinue once straight leg raise with < 10 degree lag Restrictions Weight Bearing Restrictions: Yes RLE Weight Bearing: Weight bearing as tolerated     Mobility  Bed Mobility Overal bed mobility: Needs Assistance Bed Mobility: Supine to Sit     Supine to sit: Contact guard, HOB elevated, Used rails     General bed mobility comments: nt, in recliner    Transfers Overall transfer level: Needs assistance Equipment used: Rolling walker (2 wheels) Transfers: Sit to/from Stand (x1) Sit to Stand: Min assist           General transfer comment: cues for pt to scoot hips towards edge of recliner, MinA for initial rise with rocks to build momentum    Ambulation/Gait Ambulation/Gait assistance: [employer] fast streamer (Feet): 30 Feet Assistive device: Rolling walker (2 wheels) Gait Pattern/deviations: Step-to pattern, Decreased step length - right, Decreased stance time - right, Decreased weight shift to right, Antalgic Gait velocity: dec     General Gait Details: step to gait pattern with RW   Stairs Stairs: Yes Stairs assistance: Contact guard assist Stair Management: With walker Number of Stairs: 1 (x3 reps) General stair comments: CGA for safety, cues for proper gait pattern         Balance Overall balance assessment: Needs assistance Sitting-balance support: No upper extremity supported, Feet supported Sitting balance-Leahy Scale: Fair     Standing balance support: Bilateral upper extremity supported, During functional activity Standing balance-Leahy Scale: Poor Standing balance comment: reliant on RW for stability       Cognition Arousal: Alert Behavior During Therapy: WFL for tasks assessed/performed Overall Cognitive Status: Within Functional Limits for tasks assessed           Exercises Total Joint Exercises Quad Sets: AROM, Both, 10 reps, Supine Heel Slides: AAROM, Right, 10 reps, Supine, Seated, AROM (AROM in sitting, AAROM in supine) Hip ABduction/ADduction: AROM, 10 reps, Supine, Right Straight Leg Raises: AROM, Right, Both, 10 reps Long Arc Quad: AROM, Right, 10 reps, Seated Other Exercises Other Exercises: heel raises in sitting, x10 B Other Exercises: marches in sitting, x10 B    General Comments General comments (skin integrity, edema, etc.): VSS on RA      Pertinent Vitals/Pain Pain Assessment Pain Assessment: Faces Faces Pain Scale: Hurts a little bit Pain Location: right knee Pain Descriptors / Indicators: Aching, Discomfort, Grimacing, Guarding  PT Goals (current goals can now be found in the care plan section) Acute Rehab PT Goals Patient Stated Goal: to go home PT Goal Formulation: With patient Time For Goal Achievement:  07/29/23 Potential to Achieve Goals: Good    Frequency    7X/week       AM-PAC PT "6 Clicks" Mobility   Outcome Measure  Help needed turning from your back to your side while in a flat bed without using bedrails?: A Little Help needed moving from lying on your back to sitting on the side of a flat bed without using bedrails?: A Little Help needed moving to and from a bed to a chair (including a wheelchair)?: A Little Help needed standing up from a chair using your arms (e.g., wheelchair or bedside chair)?: A Little Help needed to walk in hospital room?: A Little Help needed climbing 3-5 steps with a railing? : A Little 6 Click Score: 18    End of Session Equipment Utilized During Treatment: Gait belt Activity Tolerance: Patient tolerated treatment well Patient left: with call bell/phone within reach;in bed Nurse Communication: Mobility status PT Visit Diagnosis: Unsteadiness on feet (R26.81);Muscle weakness (generalized) (M62.81);Pain Pain - Right/Left: Right Pain - part of body: Knee     Time: 1610-9604 PT Time Calculation (min) (ACUTE ONLY): 19 min  Charges:    $Gait Training: 8-22 mins $Therapeutic Exercise: 8-22 mins PT General Charges $$ ACUTE PT VISIT: 1 Visit                     Hilton Cork, PT, DPT Secure Chat Preferred  Rehab Office [phone]   Arturo Morton [facility]ion [facility] 07/17/2023, 4:06 PM

## 2023-07-17 NOTE — Progress Notes (Signed)
Patient ID: Scott Jacobson, male   DOB: [birth year], 72 y.o.   MRN: [id number] The patient appears comfortable this morning.  He has not had nausea and vomiting since yesterday.  He understands the importance of getting up as much as possible today with mobility.  His labs showed no significant abnormality and his KUB did not show any obstructive gas pattern.  His abdomen is at his baseline and is not rigid.  His vital signs are stable today as well.  Hopefully he will mobilize better today with a goal of discharge in the next 1 to 2 days.

## 2023-07-17 NOTE — Progress Notes (Signed)
Patient ID: Scott Jacobson, male   DOB: 08/06/1951, 72 y.o.   MRN: [id number] The patient did have 1 episode of nausea and vomiting this morning after given pain medicine by nursing.  It was appropriate for them to give him pain medication in anticipation he was will be up with therapy.  He is only had Tylenol now.  He is not having a lot of pain.  He is still not been up with therapy yet today.  However, I still need to keep him here at least 1 more day in order for him to be comfortable and safe with mobility and to make sure that he is continue to feel better overall.  I am going to stop hydrocodone and try tramadol instead for him.

## 2023-07-18 MED ORDER — METHOCARBAMOL 500 MG PO TABS: 500.0000 mg | ORAL_TABLET | Freq: Four times a day (QID) | ORAL | 1 refills | Status: DC | PRN

## 2023-07-18 MED ORDER — PROMETHAZINE HCL 12.5 MG PO TABS: 12.5000 mg | ORAL_TABLET | Freq: Three times a day (TID) | ORAL | 0 refills | Status: DC | PRN

## 2023-07-18 MED ORDER — TRAMADOL HCL 50 MG PO TABS: 50.0000 mg | ORAL_TABLET | Freq: Four times a day (QID) | ORAL | 0 refills | Status: DC | PRN

## 2023-07-18 NOTE — Progress Notes (Addendum)
Physical Therapy Treatment Patient Details Name: Scott Jacobson MRN: [id number] DOB: 12/10/1950 Today's Date: 07/18/2023   History of Present Illness Pt admitted 10/8 for right TKA. PMH: NSTEMI, CVA, CABG x 4, GERD, HLD.    PT Comments  Pt received in supine, agreeable to therapy session and with good participation and tolerance for transfer, gait and exercise instruction. Verbal review for curb step sequencing and handout to reinforce stairs/HEP as instructed, pt/spouse receptive. Spouse able to demo back guarding with gait belt for transfers/gait PRN and pt without acute s/sx distress throughout. Pt assisted to don RLE knee-high TED hose and instructed on benefits of compression and frequent mobility throughout the day. Pt R knee ROM 0-90* seated and able to perform LAQ/partial SLR without significant quad lag so kept KI off, no buckling during gait trial for household distance. Pt continues to benefit from PT services to progress toward functional mobility goals, anticipate pt safe to DC home from a functional mobility standpoint once medically cleared.     If plan is discharge home, recommend the following: A little help with walking and/or transfers;A little help with bathing/dressing/bathroom;Assistance with cooking/housework;Assist for transportation;Help with stairs or ramp for entrance   Can travel by private vehicle        Equipment Recommendations  Rolling walker (2 wheels)    Recommendations for Other Services       Precautions / Restrictions Precautions Precautions: Knee Precaution Booklet Issued: Yes (comment) Required Braces or Orthoses: Knee Immobilizer - Right Knee Immobilizer - Right: Discontinue once straight leg raise with < 10 degree lag;Other (comment) (pt with fair quad set today did not don) Restrictions Weight Bearing Restrictions: Yes RLE Weight Bearing: Weight bearing as tolerated     Mobility  Bed Mobility Overal bed mobility: Needs Assistance Bed  Mobility: Supine to Sit     Supine to sit: Modified independent (Device/Increase time)     General bed mobility comments: no physical assist needed    Transfers Overall transfer level: Needs assistance Equipment used: Rolling walker (2 wheels) Transfers: Sit to/from Stand Sit to Stand: Contact guard assist           General transfer comment: cues for pt to scoot hips towards edge of bed, spouse instructed on guarding position with gait belt and able to demo back.    Ambulation/Gait Ambulation/Gait assistance: Supervision Gait Distance (Feet): 150 Feet Assistive device: Rolling walker (2 wheels) Gait Pattern/deviations: Step-to pattern, Decreased step length - right, Decreased stance time - right, Decreased weight shift to right, Decreased step length - left, Antalgic, Wide base of support Gait velocity: dec     General Gait Details: step to gait pattern with RW, PTA provided cues for slightly narrower stance as pt foot almost contacting side of RW, and improved R step length for energy conservation. VSS on RA, no acute s/sx distress throughout. Good carryover of instructions given.   Stairs         General stair comments: He was initially unable to recall proper step sequence, so PTA heavily reinforced step sequencing/safety, pt defers to perform but PTA showed pt/spouse where on HEP handout the proper sequencing instruction is written in case they forget; pt receptive.   Wheelchair Mobility     Tilt Bed    Modified Rankin (Stroke Patients Only)       Balance Overall balance assessment: Needs assistance Sitting-balance support: No upper extremity supported, Feet supported Sitting balance-Leahy Scale: Fair     Standing balance support: Bilateral upper extremity  supported, During functional activity Standing balance-Leahy Scale: Poor Standing balance comment: Reliant on RW for stability                            Cognition Arousal:  Alert Behavior During Therapy: WFL for tasks assessed/performed Overall Cognitive Status: Within Functional Limits for tasks assessed                                          Exercises Total Joint Exercises Ankle Circles/Pumps: AROM, Both, Supine, 20 reps Heel Slides: AAROM, Right, 10 reps, Supine, AROM (AA for slightly improved ROM, pt able to perform AROM) Hip ABduction/ADduction: AROM, Supine, Right, 5 reps, AAROM Long Arc Quad: AROM, Right, Seated, 5 reps Goniometric ROM: grossly 0 deg to 90 deg sitting EOB    General Comments General comments (skin integrity, edema, etc.): VSS on RA, PTA checked seated pre/post exertion; no acute s/sx distress.      Pertinent Vitals/Pain Pain Assessment Pain Assessment: Faces Faces Pain Scale: Hurts little more Pain Location: right knee Pain Descriptors / Indicators: Aching, Discomfort, Grimacing, Guarding Pain Intervention(s): Monitored during session, Repositioned, Limited activity within patient's tolerance    Home Living                          Prior Function            PT Goals (current goals can now be found in the care plan section) Acute Rehab PT Goals Patient Stated Goal: to go home PT Goal Formulation: With patient Time For Goal Achievement: 07/29/23 Progress towards PT goals: Progressing toward goals    Frequency    7X/week      PT Plan      Co-evaluation              AM-PAC PT "6 Clicks" Mobility   Outcome Measure  Help needed turning from your back to your side while in a flat bed without using bedrails?: None Help needed moving from lying on your back to sitting on the side of a flat bed without using bedrails?: None Help needed moving to and from a bed to a chair (including a wheelchair)?: A Little Help needed standing up from a chair using your arms (e.g., wheelchair or bedside chair)?: A Little Help needed to walk in hospital room?: A Little Help needed climbing 3-5  steps with a railing? : A Little 6 Click Score: 20    End of Session Equipment Utilized During Treatment: Gait belt Activity Tolerance: Patient tolerated treatment well Patient left: with call bell/phone within reach;with family/visitor present;in bed (spouse in room to assist pt with dressing while seated at EOB) Nurse Communication: Mobility status PT Visit Diagnosis: Unsteadiness on feet (R26.81);Muscle weakness (generalized) (M62.81);Pain Pain - Right/Left: Right Pain - part of body: Knee     Time: 4098-1191 PT Time Calculation (min) (ACUTE ONLY): 28 min  Charges:    $Gait Training: 8-22 mins $Therapeutic Exercise: 8-22 mins PT General Charges $$ ACUTE PT VISIT: 1 Visit                     Baelynn Schmuhl P., PTA Acute Rehabilitation Services Secure Chat Preferred 9a-5:30pm Office: [phone]    Dorathy Kinsman [facility] 07/18/2023, 1:49 PM

## 2023-07-18 NOTE — Discharge Summary (Signed)
Patient ID: Scott Jacobson MRN: [id number] DOB/AGE: 1951/08/02 72 y.o.  Admit date: 07/14/2023 Discharge date: 07/18/2023  Admission Diagnoses:  Principal Problem:   Unilateral primary osteoarthritis, right knee Active Problems:   OA (osteoarthritis) of knee   Status post total right knee replacement   Status post total knee replacement, right   Discharge Diagnoses:  Same  Past Medical History:  Diagnosis Date   Anginal pain (HCC)    Arthritis    "probably"   BPH (benign prostatic hyperplasia)    Carotid artery occlusion    Coronary artery disease    Dyspnea    GERD (gastroesophageal reflux disease)    History of kidney stones    Hypertension    Kidney stones    Macular degeneration    Myocardial infarction (HCC)    Nausea and vomiting 02/06/2016   Seasonal allergies     Surgeries: Procedure(s): RIGHT TOTAL KNEE ARTHROPLASTY on 07/14/2023   Consultants:   Discharged Condition: Improved  Hospital Course: Scott Jacobson is an 72 y.o. male who was admitted 07/14/2023 for operative treatment ofUnilateral primary osteoarthritis, right knee. Patient has severe unremitting pain that affects sleep, daily activities, and work/hobbies. After pre-op clearance the patient was taken to the operating room on 07/14/2023 and underwent  Procedure(s): RIGHT TOTAL KNEE ARTHROPLASTY.    Patient was given perioperative antibiotics:  Anti-infectives (From admission, onward)    Start     Dose/Rate Route Frequency Ordered Stop   07/15/23 0600  ceFAZolin (ANCEF) IVPB 2g/100 mL premix        2 g 200 mL/hr over 30 Minutes Intravenous On call to O.R. 07/14/23 1203 07/14/23 2049   07/14/23 2145  ceFAZolin (ANCEF) IVPB 1 g/50 mL premix        1 g 100 mL/hr over 30 Minutes Intravenous Every 6 hours 07/14/23 2053 07/15/23 0529        Patient was given sequential compression devices, early ambulation, and chemoprophylaxis to prevent DVT.  Patient benefited maximally from hospital stay and there  were no complications.    Recent vital signs: Patient Vitals for the past 24 hrs:  BP Temp Pulse Resp SpO2  07/18/23 1229 123/67 -- 92 -- 98 %  07/18/23 1200 129/62 -- 93 -- --  07/18/23 0557 (!) 146/78 98.7 F (37.1 C) 67 18 95 %  07/17/23 2034 139/71 98.6 F (37 C) 96 18 96 %     Recent laboratory studies:  Recent Labs    07/16/23 1813  WBC 16.7*  HGB 13.3  HCT 40.6  PLT 211  NA 136  K 3.8  CL 103  CO2 24  BUN 14  CREATININE 0.82  GLUCOSE 177*  CALCIUM 8.2*     Discharge Medications:   Allergies as of 07/18/2023   No Known Allergies      Medication List     TAKE these medications    aspirin EC 81 MG tablet Take 81 mg by mouth daily. Swallow whole.   atorvastatin 40 MG tablet Commonly known as: LIPITOR TAKE 1 TABLET BY MOUTH EVERY DAY   clopidogrel 75 MG tablet Commonly known as: PLAVIX Take 75 mg by mouth daily.   Fish Oil 1000 MG Caps Take 1,000-2,000 mg by mouth See admin instructions. Take 2000 mg by mouth in the evening and 1000 mg at bedtime   loratadine 10 MG tablet Commonly known as: CLARITIN Take 10 mg by mouth every evening.   methocarbamol 500 MG tablet Commonly known as: ROBAXIN Take 1  tablet (500 mg total) by mouth every 6 (six) hours as needed for muscle spasms.   metoprolol tartrate 25 MG tablet Commonly known as: LOPRESSOR Take 1 tablet (25 mg total) by mouth 2 (two) times daily.   multivitamin with minerals Tabs tablet Take 1 tablet by mouth daily.   pantoprazole 40 MG tablet Commonly known as: PROTONIX Take 40 mg by mouth daily before breakfast.   PRESERVISION AREDS 2 PO Take 1 capsule by mouth in the morning and at bedtime.   promethazine 12.5 MG tablet Commonly known as: PHENERGAN Take 1 tablet (12.5 mg total) by mouth every 8 (eight) hours as needed for nausea or vomiting.   ramipril 5 MG capsule Commonly known as: ALTACE Take 5 mg by mouth in the morning.   REFRESH OP Place 1 drop into both eyes in the  morning.   traMADol 50 MG tablet Commonly known as: ULTRAM Take 1-2 tablets (50-100 mg total) by mouth every 6 (six) hours as needed for moderate pain.        Diagnostic Studies: DG Abd 1 View  Result Date: 07/16/2023 CLINICAL DATA:  Nausea and vomiting.  Black color and vomit. EXAM: ABDOMEN - 1 VIEW COMPARISON:  06/15/2023. FINDINGS: The bowel gas pattern is normal. No radio-opaque calculi. The heart is enlarged and sternotomy wires are present over the midline. IMPRESSION: Nonobstructive bowel-gas pattern. Electronically Signed   By: Thornell Sartorius M.D.   On: 07/16/2023 21:15   DG Knee Right Port  Result Date: 07/14/2023 CLINICAL DATA:  Status post right knee replacement. EXAM: PORTABLE RIGHT KNEE - 1-2 VIEW COMPARISON:  None Available. FINDINGS: Right knee arthroplasty in expected alignment. No periprosthetic lucency or fracture. Recent postsurgical change includes air and edema in the soft tissues and joint space. Anterior skin staples in place. IMPRESSION: Right knee arthroplasty without immediate postoperative complication. Electronically Signed   By: Narda Rutherford M.D.   On: 07/14/2023 19:19   MR ABDOMEN WWO CONTRAST  Result Date: 06/30/2023 CLINICAL DATA:  Characterize incidental liver lesion identified by prior CT EXAM: MRI ABDOMEN WITHOUT AND WITH CONTRAST TECHNIQUE: Multiplanar multisequence MR imaging of the abdomen was performed both before and after the administration of intravenous contrast. CONTRAST:  7mL GADAVIST GADOBUTROL 1 MMOL/ML IV SOLN COMPARISON:  CT abdomen pelvis, 06/09/2023 FINDINGS: Lower chest: No acute abnormality. Moderate hiatal hernia with intrathoracic gastric fundus. Hepatobiliary: No solid liver abnormality is seen. Definitively benign hemangioma with discontinuous progressive peripheral nodular contrast enhancement in anterior hepatic segment IVA/B measuring 2.1 x 1.7 cm (series 14, image 37). Additional small simple cysts and hemangiomata scattered  throughout the liver, benign, requiring no further follow-up or characterization. No gallstones, gallbladder wall thickening, or biliary dilatation. Pancreas: Unremarkable. No pancreatic ductal dilatation or surrounding inflammatory changes. Spleen: Normal in size without significant abnormality. Adrenals/Urinary Tract: Unchanged definitively benign macroscopic fat containing left adrenal adenoma, for which no further follow-up or characterization is required. Simple, benign right renal cortical cysts, for which no further follow-up or characterization is required. Kidneys are otherwise normal, without obvious renal calculi by MR, solid lesion, or hydronephrosis. Stomach/Bowel: Stomach is within normal limits. No evidence of bowel wall thickening, distention, or inflammatory changes. Descending colonic diverticulosis. Vascular/Lymphatic: Aortic atherosclerosis. Unchanged chronic dissection of the infrarenal abdominal aorta (series 14, image 73). No enlarged abdominal lymph nodes. Other: No abdominal wall hernia or abnormality. No ascites. Musculoskeletal: No acute or significant osseous findings. IMPRESSION: 1. Definitively benign hemangioma of anterior hepatic segment IVA/B measuring 2.1 x 1.7 cm. Additional  small simple cysts and hemangiomata scattered throughout the liver. No further follow-up or characterization is required for these benign findings. 2. Patient's known nephrolithiasis not well appreciated by MR. 3. Moderate hiatal hernia with intrathoracic gastric fundus. 4. Descending colonic diverticulosis. 5. Unchanged chronic dissection of the infrarenal abdominal aorta. Aortic Atherosclerosis (ICD10-I70.0). Electronically Signed   By: Jearld Lesch M.D.   On: 06/30/2023 22:17    Disposition: Discharge disposition: 01-Home or Self Care          Follow-up Information     Kathryne Hitch, MD Follow up.   Specialty: Orthopedic Surgery Contact information: [address] [place]  [address] [phone]         [name], MD Follow up.   Specialty: Internal Medicine Contact information: [address] [place] [address] [phone]         [facility] Follow up.   Why: Home health services will be provided by [facility], start of care within 48 hours post discharge Contact information: [address] [place] [address] [phone]                  Signed: Kathryne Hitch 07/18/2023, 7:31 PM

## 2023-07-18 NOTE — Progress Notes (Signed)
Patient ID: Scott Jacobson, male   DOB: [birth year], 72 y.o.   MRN: [id number] The patient continues to make slow improvements.  He mobilized much better yesterday with physical therapy.  He had 1 episode of nausea but no vomiting.  His vital signs are stable this morning and his right operative knee is stable.  His wife is at the bedside.  He can be discharged to home today after his PT session.  Home health PT is coming to his house tomorrow.

## 2023-07-18 NOTE — Progress Notes (Signed)
AVS reviewed with pt and wife. Pt discharged with Iceman and walker. IV removed.

## 2023-07-18 NOTE — TOC Transition Note (Signed)
Transition of Care [facility]) - CM/SW Discharge Note   Patient Details  Name: Scott Jacobson MRN: [id number] Date of Birth: July 29, 1951  [facility] CM/SW Contact:  Ronny Bacon, RN Phone Number: 07/18/2023, 10:27 AM   Clinical Narrative:   Patient is being discharged today. Amy- [facility] made aware.    Final next level of care: Home w Home Health Services Barriers to Discharge: No Barriers Identified   Patient Goals and CMS Choice      Discharge Placement                         Discharge Plan and Services Additional resources added to the After Visit Summary for                                       Social Determinants of Health (SDOH) Interventions SDOH Screenings   Food Insecurity: No Food Insecurity (07/14/2023)  Housing: Low Risk  (07/14/2023)  Transportation Needs: No Transportation Needs (07/14/2023)  Utilities: Not At Risk (07/14/2023)  Tobacco Use: Medium Risk (07/14/2023)     Readmission Risk Interventions     No data to display

## 2023-07-19 DIAGNOSIS — Z471 Aftercare following joint replacement surgery: Secondary | ICD-10-CM | POA: Diagnosis not present

## 2023-07-19 DIAGNOSIS — H3530 Unspecified macular degeneration: Secondary | ICD-10-CM | POA: Diagnosis not present

## 2023-07-19 DIAGNOSIS — K219 Gastro-esophageal reflux disease without esophagitis: Secondary | ICD-10-CM | POA: Diagnosis not present

## 2023-07-19 DIAGNOSIS — Z96651 Presence of right artificial knee joint: Secondary | ICD-10-CM | POA: Diagnosis not present

## 2023-07-19 DIAGNOSIS — E785 Hyperlipidemia, unspecified: Secondary | ICD-10-CM | POA: Diagnosis not present

## 2023-07-19 DIAGNOSIS — Z951 Presence of aortocoronary bypass graft: Secondary | ICD-10-CM | POA: Diagnosis not present

## 2023-07-19 DIAGNOSIS — I2510 Atherosclerotic heart disease of native coronary artery without angina pectoris: Secondary | ICD-10-CM | POA: Diagnosis not present

## 2023-07-19 DIAGNOSIS — I6529 Occlusion and stenosis of unspecified carotid artery: Secondary | ICD-10-CM | POA: Diagnosis not present

## 2023-07-19 DIAGNOSIS — H33051 Total retinal detachment, right eye: Secondary | ICD-10-CM | POA: Diagnosis not present

## 2023-07-19 DIAGNOSIS — I252 Old myocardial infarction: Secondary | ICD-10-CM | POA: Diagnosis not present

## 2023-07-21 DIAGNOSIS — Z471 Aftercare following joint replacement surgery: Secondary | ICD-10-CM | POA: Diagnosis not present

## 2023-07-21 DIAGNOSIS — I252 Old myocardial infarction: Secondary | ICD-10-CM | POA: Diagnosis not present

## 2023-07-21 DIAGNOSIS — I2510 Atherosclerotic heart disease of native coronary artery without angina pectoris: Secondary | ICD-10-CM | POA: Diagnosis not present

## 2023-07-21 DIAGNOSIS — Z951 Presence of aortocoronary bypass graft: Secondary | ICD-10-CM | POA: Diagnosis not present

## 2023-07-21 DIAGNOSIS — H3530 Unspecified macular degeneration: Secondary | ICD-10-CM | POA: Diagnosis not present

## 2023-07-21 DIAGNOSIS — E785 Hyperlipidemia, unspecified: Secondary | ICD-10-CM | POA: Diagnosis not present

## 2023-07-21 DIAGNOSIS — H33051 Total retinal detachment, right eye: Secondary | ICD-10-CM | POA: Diagnosis not present

## 2023-07-21 DIAGNOSIS — Z96651 Presence of right artificial knee joint: Secondary | ICD-10-CM | POA: Diagnosis not present

## 2023-07-21 DIAGNOSIS — I6529 Occlusion and stenosis of unspecified carotid artery: Secondary | ICD-10-CM | POA: Diagnosis not present

## 2023-07-21 DIAGNOSIS — K219 Gastro-esophageal reflux disease without esophagitis: Secondary | ICD-10-CM | POA: Diagnosis not present

## 2023-07-22 DIAGNOSIS — I6529 Occlusion and stenosis of unspecified carotid artery: Secondary | ICD-10-CM | POA: Diagnosis not present

## 2023-07-22 DIAGNOSIS — I2510 Atherosclerotic heart disease of native coronary artery without angina pectoris: Secondary | ICD-10-CM | POA: Diagnosis not present

## 2023-07-22 DIAGNOSIS — H33051 Total retinal detachment, right eye: Secondary | ICD-10-CM | POA: Diagnosis not present

## 2023-07-22 DIAGNOSIS — E785 Hyperlipidemia, unspecified: Secondary | ICD-10-CM | POA: Diagnosis not present

## 2023-07-22 DIAGNOSIS — I252 Old myocardial infarction: Secondary | ICD-10-CM | POA: Diagnosis not present

## 2023-07-22 DIAGNOSIS — Z96651 Presence of right artificial knee joint: Secondary | ICD-10-CM | POA: Diagnosis not present

## 2023-07-22 DIAGNOSIS — K219 Gastro-esophageal reflux disease without esophagitis: Secondary | ICD-10-CM | POA: Diagnosis not present

## 2023-07-22 DIAGNOSIS — Z951 Presence of aortocoronary bypass graft: Secondary | ICD-10-CM | POA: Diagnosis not present

## 2023-07-22 DIAGNOSIS — Z471 Aftercare following joint replacement surgery: Secondary | ICD-10-CM | POA: Diagnosis not present

## 2023-07-22 DIAGNOSIS — H3530 Unspecified macular degeneration: Secondary | ICD-10-CM | POA: Diagnosis not present

## 2023-07-23 ENCOUNTER — Telehealth: Payer: Self-pay | Admitting: *Deleted

## 2023-07-23 ENCOUNTER — Other Ambulatory Visit: Payer: Self-pay | Admitting: Orthopaedic Surgery

## 2023-07-23 DIAGNOSIS — H3530 Unspecified macular degeneration: Secondary | ICD-10-CM | POA: Diagnosis not present

## 2023-07-23 DIAGNOSIS — Z951 Presence of aortocoronary bypass graft: Secondary | ICD-10-CM | POA: Diagnosis not present

## 2023-07-23 DIAGNOSIS — I252 Old myocardial infarction: Secondary | ICD-10-CM | POA: Diagnosis not present

## 2023-07-23 DIAGNOSIS — Z471 Aftercare following joint replacement surgery: Secondary | ICD-10-CM | POA: Diagnosis not present

## 2023-07-23 DIAGNOSIS — I2510 Atherosclerotic heart disease of native coronary artery without angina pectoris: Secondary | ICD-10-CM | POA: Diagnosis not present

## 2023-07-23 DIAGNOSIS — I6529 Occlusion and stenosis of unspecified carotid artery: Secondary | ICD-10-CM | POA: Diagnosis not present

## 2023-07-23 DIAGNOSIS — Z96651 Presence of right artificial knee joint: Secondary | ICD-10-CM | POA: Diagnosis not present

## 2023-07-23 DIAGNOSIS — E785 Hyperlipidemia, unspecified: Secondary | ICD-10-CM | POA: Diagnosis not present

## 2023-07-23 DIAGNOSIS — H33051 Total retinal detachment, right eye: Secondary | ICD-10-CM | POA: Diagnosis not present

## 2023-07-23 DIAGNOSIS — K219 Gastro-esophageal reflux disease without esophagitis: Secondary | ICD-10-CM | POA: Diagnosis not present

## 2023-07-23 MED ORDER — METHOCARBAMOL 500 MG PO TABS: 500.0000 mg | ORAL_TABLET | Freq: Four times a day (QID) | ORAL | 1 refills | Status: DC | PRN

## 2023-07-23 MED ORDER — TRAMADOL HCL 50 MG PO TABS: 50.0000 mg | ORAL_TABLET | Freq: Four times a day (QID) | ORAL | 0 refills | Status: DC | PRN

## 2023-07-23 NOTE — Telephone Encounter (Signed)
Patient requesting refill of muscle relaxer and pain medication (Tramadol) before weekend. Thank you.

## 2023-07-24 ENCOUNTER — Other Ambulatory Visit: Payer: Self-pay | Admitting: Orthopaedic Surgery

## 2023-07-24 DIAGNOSIS — Z96651 Presence of right artificial knee joint: Secondary | ICD-10-CM | POA: Diagnosis not present

## 2023-07-24 DIAGNOSIS — Z951 Presence of aortocoronary bypass graft: Secondary | ICD-10-CM | POA: Diagnosis not present

## 2023-07-24 DIAGNOSIS — I2510 Atherosclerotic heart disease of native coronary artery without angina pectoris: Secondary | ICD-10-CM | POA: Diagnosis not present

## 2023-07-24 DIAGNOSIS — I252 Old myocardial infarction: Secondary | ICD-10-CM | POA: Diagnosis not present

## 2023-07-24 DIAGNOSIS — H33051 Total retinal detachment, right eye: Secondary | ICD-10-CM | POA: Diagnosis not present

## 2023-07-24 DIAGNOSIS — I6529 Occlusion and stenosis of unspecified carotid artery: Secondary | ICD-10-CM | POA: Diagnosis not present

## 2023-07-24 DIAGNOSIS — H3530 Unspecified macular degeneration: Secondary | ICD-10-CM | POA: Diagnosis not present

## 2023-07-24 DIAGNOSIS — Z471 Aftercare following joint replacement surgery: Secondary | ICD-10-CM | POA: Diagnosis not present

## 2023-07-24 DIAGNOSIS — K219 Gastro-esophageal reflux disease without esophagitis: Secondary | ICD-10-CM | POA: Diagnosis not present

## 2023-07-24 DIAGNOSIS — E785 Hyperlipidemia, unspecified: Secondary | ICD-10-CM | POA: Diagnosis not present

## 2023-07-27 ENCOUNTER — Ambulatory Visit (INDEPENDENT_AMBULATORY_CARE_PROVIDER_SITE_OTHER): Payer: Medicare Other | Admitting: Orthopaedic Surgery

## 2023-07-27 ENCOUNTER — Encounter: Payer: Self-pay | Admitting: Orthopaedic Surgery

## 2023-07-27 DIAGNOSIS — I6529 Occlusion and stenosis of unspecified carotid artery: Secondary | ICD-10-CM | POA: Diagnosis not present

## 2023-07-27 DIAGNOSIS — E785 Hyperlipidemia, unspecified: Secondary | ICD-10-CM | POA: Diagnosis not present

## 2023-07-27 DIAGNOSIS — I2510 Atherosclerotic heart disease of native coronary artery without angina pectoris: Secondary | ICD-10-CM | POA: Diagnosis not present

## 2023-07-27 DIAGNOSIS — Z96651 Presence of right artificial knee joint: Secondary | ICD-10-CM

## 2023-07-27 DIAGNOSIS — H3530 Unspecified macular degeneration: Secondary | ICD-10-CM | POA: Diagnosis not present

## 2023-07-27 DIAGNOSIS — H33051 Total retinal detachment, right eye: Secondary | ICD-10-CM | POA: Diagnosis not present

## 2023-07-27 DIAGNOSIS — Z951 Presence of aortocoronary bypass graft: Secondary | ICD-10-CM | POA: Diagnosis not present

## 2023-07-27 DIAGNOSIS — Z471 Aftercare following joint replacement surgery: Secondary | ICD-10-CM | POA: Diagnosis not present

## 2023-07-27 DIAGNOSIS — I252 Old myocardial infarction: Secondary | ICD-10-CM | POA: Diagnosis not present

## 2023-07-27 DIAGNOSIS — K219 Gastro-esophageal reflux disease without esophagitis: Secondary | ICD-10-CM | POA: Diagnosis not present

## 2023-07-27 NOTE — Progress Notes (Signed)
The patient is here today for his first postoperative visit status post a right total knee replacement.  He is on a baby aspirin and Plavix from before surgery.  He knows to stay on these.  He said the best they have flexed him to his about 88 degrees on that knee.  He is doing well overall.  He is just taking Robaxin and tramadol because he could not tolerate other narcotics due to nausea and vomiting.  He is doing well overall.  He is ready to transition outpatient therapy.  His right calf is soft.  He has been compliant wearing compressive hose.  He lacks full extension by about 5 degrees and I can flex him to about 85 degrees.  The staples been removed and Steri-Strips applied from the incision.  He will transition outpatient therapy.  I like to see him back in 4 weeks to see how he is doing from mobility and range of motion standpoint but no x-rays are needed.

## 2023-07-29 ENCOUNTER — Telehealth: Payer: Self-pay | Admitting: *Deleted

## 2023-07-29 ENCOUNTER — Other Ambulatory Visit: Payer: Self-pay | Admitting: Orthopaedic Surgery

## 2023-07-29 DIAGNOSIS — M25561 Pain in right knee: Secondary | ICD-10-CM | POA: Diagnosis not present

## 2023-07-29 MED ORDER — TRAMADOL HCL 50 MG PO TABS: 50.0000 mg | ORAL_TABLET | Freq: Four times a day (QID) | ORAL | 0 refills | Status: DC | PRN

## 2023-07-29 MED ORDER — METHOCARBAMOL 500 MG PO TABS: 500.0000 mg | ORAL_TABLET | Freq: Four times a day (QID) | ORAL | 1 refills | Status: DC | PRN

## 2023-07-29 NOTE — Telephone Encounter (Signed)
Patient called and requested refill of Tramadol and muscle relaxer before the weekend. Thank you.

## 2023-07-30 ENCOUNTER — Other Ambulatory Visit: Payer: Self-pay | Admitting: Orthopaedic Surgery

## 2023-08-03 ENCOUNTER — Telehealth: Payer: Self-pay | Admitting: *Deleted

## 2023-08-03 ENCOUNTER — Other Ambulatory Visit: Payer: Self-pay | Admitting: Orthopaedic Surgery

## 2023-08-03 DIAGNOSIS — M25561 Pain in right knee: Secondary | ICD-10-CM | POA: Diagnosis not present

## 2023-08-03 MED ORDER — TRAMADOL HCL 50 MG PO TABS: 50.0000 mg | ORAL_TABLET | Freq: Four times a day (QID) | ORAL | 0 refills | Status: DC | PRN

## 2023-08-03 NOTE — Telephone Encounter (Signed)
Patient called requesting refill of Tramadol. Thank you.

## 2023-08-05 DIAGNOSIS — M25561 Pain in right knee: Secondary | ICD-10-CM | POA: Diagnosis not present

## 2023-08-06 ENCOUNTER — Other Ambulatory Visit: Payer: Self-pay | Admitting: Radiology

## 2023-08-06 ENCOUNTER — Other Ambulatory Visit: Payer: Self-pay | Admitting: Orthopaedic Surgery

## 2023-08-06 ENCOUNTER — Telehealth: Payer: Self-pay | Admitting: Radiology

## 2023-08-06 MED ORDER — TRAMADOL HCL 50 MG PO TABS: 50.0000 mg | ORAL_TABLET | Freq: Four times a day (QID) | ORAL | 0 refills | Status: DC | PRN

## 2023-08-06 NOTE — Telephone Encounter (Signed)
Patient is requesting refill for tramadol.

## 2023-08-10 DIAGNOSIS — M25561 Pain in right knee: Secondary | ICD-10-CM | POA: Diagnosis not present

## 2023-08-12 DIAGNOSIS — M25561 Pain in right knee: Secondary | ICD-10-CM | POA: Diagnosis not present

## 2023-08-17 ENCOUNTER — Telehealth: Payer: Self-pay | Admitting: *Deleted

## 2023-08-17 ENCOUNTER — Other Ambulatory Visit: Payer: Self-pay | Admitting: Orthopaedic Surgery

## 2023-08-17 DIAGNOSIS — M25561 Pain in right knee: Secondary | ICD-10-CM | POA: Diagnosis not present

## 2023-08-17 MED ORDER — TRAMADOL HCL 50 MG PO TABS: 50.0000 mg | ORAL_TABLET | Freq: Four times a day (QID) | ORAL | 0 refills | Status: DC | PRN

## 2023-08-17 NOTE — Telephone Encounter (Signed)
Patient called requesting refill of Tramadol. Thank you.

## 2023-08-18 ENCOUNTER — Ambulatory Visit: Payer: Medicare Other | Admitting: Urology

## 2023-08-19 DIAGNOSIS — M25561 Pain in right knee: Secondary | ICD-10-CM | POA: Diagnosis not present

## 2023-08-24 ENCOUNTER — Encounter: Payer: Self-pay | Admitting: Orthopaedic Surgery

## 2023-08-24 ENCOUNTER — Ambulatory Visit (INDEPENDENT_AMBULATORY_CARE_PROVIDER_SITE_OTHER): Payer: Medicare Other | Admitting: Orthopaedic Surgery

## 2023-08-24 DIAGNOSIS — Z96651 Presence of right artificial knee joint: Secondary | ICD-10-CM

## 2023-08-24 NOTE — Progress Notes (Signed)
The patient is now 6 weeks status post a right total knee replacement.  He is working hard in physical therapy.  He is ambulate with a cane.  He is 72 years old.  He is not a diabetic.  On exam today he lacks full extension by at least 3 degrees to almost 5 degrees.  I can push him hard and still not getting fully extended.  I can flex him to about 95 degrees.  He will continue to push himself hard through physical therapy.  Since he is not a diabetic I am going to treat him in the office today as of he was having a manipulation because I do feel a steroid injection in his knee in the office today could help push him through getting his motion to increase even more.  He did tolerate the steroid injection well today.  We will see him back in a month to see how he is doing overall from a range of motion standpoint but no x-rays are needed.

## 2023-08-25 DIAGNOSIS — E7849 Other hyperlipidemia: Secondary | ICD-10-CM | POA: Diagnosis not present

## 2023-08-25 DIAGNOSIS — Z8673 Personal history of transient ischemic attack (TIA), and cerebral infarction without residual deficits: Secondary | ICD-10-CM | POA: Diagnosis not present

## 2023-08-25 DIAGNOSIS — I10 Essential (primary) hypertension: Secondary | ICD-10-CM | POA: Diagnosis not present

## 2023-08-25 DIAGNOSIS — K219 Gastro-esophageal reflux disease without esophagitis: Secondary | ICD-10-CM | POA: Diagnosis not present

## 2023-08-25 DIAGNOSIS — I25798 Atherosclerosis of other coronary artery bypass graft(s) with other forms of angina pectoris: Secondary | ICD-10-CM | POA: Diagnosis not present

## 2023-08-26 DIAGNOSIS — M25561 Pain in right knee: Secondary | ICD-10-CM | POA: Diagnosis not present

## 2023-08-28 DIAGNOSIS — M25561 Pain in right knee: Secondary | ICD-10-CM | POA: Diagnosis not present

## 2023-08-31 ENCOUNTER — Other Ambulatory Visit: Payer: Self-pay | Admitting: Orthopaedic Surgery

## 2023-08-31 ENCOUNTER — Telehealth: Payer: Self-pay | Admitting: *Deleted

## 2023-08-31 DIAGNOSIS — M25561 Pain in right knee: Secondary | ICD-10-CM | POA: Diagnosis not present

## 2023-08-31 MED ORDER — TRAMADOL HCL 50 MG PO TABS: 50.0000 mg | ORAL_TABLET | Freq: Four times a day (QID) | ORAL | 0 refills | Status: DC | PRN

## 2023-08-31 NOTE — Telephone Encounter (Signed)
Patient calling fo refill of pain medication if possible. Thank you.

## 2023-09-01 ENCOUNTER — Other Ambulatory Visit: Payer: Self-pay | Admitting: Orthopaedic Surgery

## 2023-09-02 DIAGNOSIS — M25561 Pain in right knee: Secondary | ICD-10-CM | POA: Diagnosis not present

## 2023-09-07 DIAGNOSIS — M25561 Pain in right knee: Secondary | ICD-10-CM | POA: Diagnosis not present

## 2023-09-09 DIAGNOSIS — M25561 Pain in right knee: Secondary | ICD-10-CM | POA: Diagnosis not present

## 2023-09-10 ENCOUNTER — Other Ambulatory Visit: Payer: Self-pay | Admitting: Orthopaedic Surgery

## 2023-09-10 ENCOUNTER — Telehealth: Payer: Self-pay | Admitting: *Deleted

## 2023-09-10 MED ORDER — METHOCARBAMOL 500 MG PO TABS: 500.0000 mg | ORAL_TABLET | Freq: Four times a day (QID) | ORAL | 1 refills | Status: DC | PRN

## 2023-09-10 MED ORDER — TRAMADOL HCL 50 MG PO TABS: 50.0000 mg | ORAL_TABLET | Freq: Four times a day (QID) | ORAL | 0 refills | Status: DC | PRN

## 2023-09-10 NOTE — Telephone Encounter (Signed)
Call from patient requesting refill of Tramadol and muscle relaxer. Pharmacy on chart. Thank you.

## 2023-09-14 DIAGNOSIS — M25561 Pain in right knee: Secondary | ICD-10-CM | POA: Diagnosis not present

## 2023-09-16 DIAGNOSIS — M25561 Pain in right knee: Secondary | ICD-10-CM | POA: Diagnosis not present

## 2023-09-21 ENCOUNTER — Telehealth: Payer: Self-pay | Admitting: *Deleted

## 2023-09-21 ENCOUNTER — Other Ambulatory Visit: Payer: Self-pay | Admitting: Orthopaedic Surgery

## 2023-09-21 ENCOUNTER — Encounter: Payer: Medicare Other | Admitting: Orthopaedic Surgery

## 2023-09-21 DIAGNOSIS — M25561 Pain in right knee: Secondary | ICD-10-CM | POA: Diagnosis not present

## 2023-09-21 MED ORDER — METHOCARBAMOL 500 MG PO TABS: 500.0000 mg | ORAL_TABLET | Freq: Four times a day (QID) | ORAL | 1 refills | Status: DC | PRN

## 2023-09-21 NOTE — Telephone Encounter (Signed)
Patient's wife called requesting refill for Muscle relaxer. Pharmacy on chart.Thank you.

## 2023-09-23 ENCOUNTER — Ambulatory Visit (INDEPENDENT_AMBULATORY_CARE_PROVIDER_SITE_OTHER): Payer: Medicare Other | Admitting: Orthopaedic Surgery

## 2023-09-23 ENCOUNTER — Encounter: Payer: Self-pay | Admitting: Orthopaedic Surgery

## 2023-09-23 DIAGNOSIS — Z96651 Presence of right artificial knee joint: Secondary | ICD-10-CM

## 2023-09-23 NOTE — Progress Notes (Signed)
The patient is now 10 weeks status post a right total knee replacement.  He said he would like to have 4 more weeks of physical therapy in [facility] physical therapy where he is at would like that as well.  I did give her a prescription agreement that would be relevant improvement for formal weeks of physical therapy on his right knee.  On my exam today he lacks full extension by just a few degrees and I can flex him to about 110 degrees.  The knee feels loosely stable.  There is still some swelling to be expected.  He will continue to increase his activities as comfort allows.  He will go through physical therapy for at least 4 more weeks.  From my standpoint we will see him back in 3 months and will have a standing AP and lateral of his right operative knee at that visit.  If there are issues before then they know to let us know

## 2023-10-02 DIAGNOSIS — M25561 Pain in right knee: Secondary | ICD-10-CM | POA: Diagnosis not present

## 2023-10-04 ENCOUNTER — Other Ambulatory Visit: Payer: Self-pay | Admitting: Orthopaedic Surgery

## 2023-10-05 DIAGNOSIS — M25561 Pain in right knee: Secondary | ICD-10-CM | POA: Diagnosis not present

## 2023-10-08 DIAGNOSIS — Z872 Personal history of diseases of the skin and subcutaneous tissue: Secondary | ICD-10-CM | POA: Diagnosis not present

## 2023-10-08 DIAGNOSIS — L814 Other melanin hyperpigmentation: Secondary | ICD-10-CM | POA: Diagnosis not present

## 2023-10-08 DIAGNOSIS — L821 Other seborrheic keratosis: Secondary | ICD-10-CM | POA: Diagnosis not present

## 2023-10-08 DIAGNOSIS — D225 Melanocytic nevi of trunk: Secondary | ICD-10-CM | POA: Diagnosis not present

## 2023-10-08 DIAGNOSIS — Z09 Encounter for follow-up examination after completed treatment for conditions other than malignant neoplasm: Secondary | ICD-10-CM | POA: Diagnosis not present

## 2023-10-09 DIAGNOSIS — M25561 Pain in right knee: Secondary | ICD-10-CM | POA: Diagnosis not present

## 2023-10-12 DIAGNOSIS — M25561 Pain in right knee: Secondary | ICD-10-CM | POA: Diagnosis not present

## 2023-10-14 DIAGNOSIS — M25561 Pain in right knee: Secondary | ICD-10-CM | POA: Diagnosis not present

## 2023-10-19 DIAGNOSIS — M25561 Pain in right knee: Secondary | ICD-10-CM | POA: Diagnosis not present

## 2023-10-20 DIAGNOSIS — M25561 Pain in right knee: Secondary | ICD-10-CM | POA: Diagnosis not present

## 2023-11-02 ENCOUNTER — Other Ambulatory Visit: Payer: Self-pay | Admitting: Orthopaedic Surgery

## 2023-11-13 ENCOUNTER — Other Ambulatory Visit: Payer: Self-pay

## 2023-11-13 DIAGNOSIS — I6523 Occlusion and stenosis of bilateral carotid arteries: Secondary | ICD-10-CM

## 2023-11-18 ENCOUNTER — Other Ambulatory Visit: Payer: Self-pay | Admitting: Orthopaedic Surgery

## 2023-11-18 ENCOUNTER — Telehealth: Payer: Self-pay | Admitting: *Deleted

## 2023-11-18 MED ORDER — METHOCARBAMOL 500 MG PO TABS: 500.0000 mg | ORAL_TABLET | Freq: Four times a day (QID) | ORAL | 1 refills | Status: DC | PRN

## 2023-11-18 NOTE — Telephone Encounter (Signed)
Patient called and requested a refill of muscle relaxer. States he is doing well.

## 2023-11-24 DIAGNOSIS — I10 Essential (primary) hypertension: Secondary | ICD-10-CM | POA: Diagnosis not present

## 2023-11-24 DIAGNOSIS — R7303 Prediabetes: Secondary | ICD-10-CM | POA: Diagnosis not present

## 2023-11-24 DIAGNOSIS — K219 Gastro-esophageal reflux disease without esophagitis: Secondary | ICD-10-CM | POA: Diagnosis not present

## 2023-11-24 DIAGNOSIS — I25798 Atherosclerosis of other coronary artery bypass graft(s) with other forms of angina pectoris: Secondary | ICD-10-CM | POA: Diagnosis not present

## 2023-11-24 DIAGNOSIS — E7849 Other hyperlipidemia: Secondary | ICD-10-CM | POA: Diagnosis not present

## 2023-11-24 DIAGNOSIS — Z8673 Personal history of transient ischemic attack (TIA), and cerebral infarction without residual deficits: Secondary | ICD-10-CM | POA: Diagnosis not present

## 2023-11-24 DIAGNOSIS — Z0000 Encounter for general adult medical examination without abnormal findings: Secondary | ICD-10-CM | POA: Diagnosis not present

## 2023-11-26 ENCOUNTER — Ambulatory Visit (HOSPITAL_COMMUNITY)
Admission: RE | Admit: 2023-11-26 | Discharge: 2023-11-26 | Disposition: A | Discharge status: Home / Self Care | Payer: Medicare Other | Source: Ambulatory Visit | Attending: Vascular Surgery | Admitting: Vascular Surgery

## 2023-11-26 ENCOUNTER — Ambulatory Visit: Payer: Medicare Other

## 2023-11-26 VITALS — BP 144/81 | HR 68 | Temp 98.3°F | Resp 18 | Ht 67.5 in | Wt 207.1 lb

## 2023-11-26 DIAGNOSIS — I6523 Occlusion and stenosis of bilateral carotid arteries: Secondary | ICD-10-CM | POA: Diagnosis not present

## 2023-11-26 NOTE — Progress Notes (Signed)
 HISTORY AND PHYSICAL     CC:  follow up. Requesting Provider:  [name], MD  HPI: This is a 73 y.o. male here for follow up for carotid artery stenosis.  Pt is s/p right TCAR for symptomatic carotid artery stenosis on 12/19/2021 by Dr. Karin Lieu.  Pt has hx of CABG, OA, HLD, BPH, macular degeneration, GERD.  Pt returns today for follow up.    Pt denies any amaurosis fugax, speech difficulties, weakness, numbness, paralysis or clumsiness or facial droop.   He states he still gets occasional left sided facial numbness.  May happen once a month and then resolves.    He had his right knee replacement in October 2024.  The pt is on a statin for cholesterol management.  The pt is on a daily aspirin.   Other AC:  Plavix The pt is on BB, ACEI for hypertension.   The pt is not on medication for diabetes Tobacco hx:  former  Pt does not have family hx of AAA.  Past Medical History:  Diagnosis Date   Anginal pain (HCC)    Arthritis    "probably"   BPH (benign prostatic hyperplasia)    Carotid artery occlusion    Coronary artery disease    Dyspnea    GERD (gastroesophageal reflux disease)    History of kidney stones    Hypertension    Kidney stones    Macular degeneration    Myocardial infarction [facility])    Nausea and vomiting 02/06/2016   Seasonal allergies     Past Surgical History:  Procedure Laterality Date   APPENDECTOMY     BACK SURGERY     CARDIAC CATHETERIZATION N/A 08/21/2016   Procedure: Left Heart Cath and Coronary Angiography;  Surgeon: Kathleene Hazel, MD;  Location: [facility] INVASIVE CV LAB;  Service: Cardiovascular;  Laterality: N/A;   CARDIAC CATHETERIZATION N/A 08/21/2016   Procedure: Coronary Stent Intervention;  Surgeon: Kathleene Hazel, MD;  Location: [facility] INVASIVE CV LAB;  Service: Cardiovascular;  Laterality: N/A;   CARDIAC SURGERY     quadruple bypass   CATARACT EXTRACTION W/ INTRAOCULAR LENS  IMPLANT, BILATERAL Bilateral    COLONOSCOPY      COLONOSCOPY WITH PROPOFOL N/A 03/05/2021   Procedure: COLONOSCOPY WITH PROPOFOL;  Surgeon: Dolores Frame, MD;  Location: [facility] ENDO SUITE;  Service: Gastroenterology;  Laterality: N/A;  11:00   CORONARY ARTERY BYPASS GRAFT N/A 10/03/2016   Procedure: CORONARY ARTERY BYPASS GRAFTING (CABG)x 4 with endoscopic harvesting of right saphenous vein ,LIMA-LAD SVG-DIAG SVG-OM SVG-RCA;  Surgeon: Alleen Borne, MD;  Location: [facility] OR;  Service: Open Heart Surgery;  Laterality: N/A;   EYE SURGERY     GAS INSERTION Right 02/05/2016   Procedure: INSERTION OF GAS-[id number];  Surgeon: Sherrie George, MD;  Location: [facility] OR;  Service: Ophthalmology;  Laterality: Right;   HAND SURGERY Right    thumb; S/P "cut his arm"   LASER PHOTO ABLATION Right 02/05/2016   Procedure: LASER PHOTO ABLATION-HEADSCOPE LASER;  Surgeon: Sherrie George, MD;  Location: [facility] OR;  Service: Ophthalmology;  Laterality: Right;   LUMBAR DISC SURGERY  ~ 2002   POLYPECTOMY  03/05/2021   Procedure: POLYPECTOMY;  Surgeon: Dolores Frame, MD;  Location: [facility]ITE;  Service: Gastroenterology;;  ascending colon, sigmoid colon   RETINAL DETACHMENT REPAIR W/ SCLERAL BUCKLE LE Right 02/05/2016   SCLERAL BUCKLE Right 02/05/2016   Procedure: SCLERAL BUCKLE;  Surgeon: Sherrie George, MD;  Location: [facility] OR;  Service:  Ophthalmology;  Laterality: Right;   TEE WITHOUT CARDIOVERSION N/A 10/03/2016   Procedure: TRANSESOPHAGEAL ECHOCARDIOGRAM (TEE);  Surgeon: Alleen Borne, MD;  Location: [facility] OR;  Service: Open Heart Surgery;  Laterality: N/A;   TONSILLECTOMY     TOTAL KNEE ARTHROPLASTY Right 07/14/2023   Procedure: RIGHT TOTAL KNEE ARTHROPLASTY;  Surgeon: Kathryne Hitch, MD;  Location: [facility] OR;  Service: Orthopedics;  Laterality: Right;   TRANSCAROTID ARTERY REVASCULARIZATION  Right 12/19/2021   Procedure: Right Transcarotid Artery Revascularization;  Surgeon: Victorino Sparrow, MD;  Location: [facility] OR;  Service: Vascular;  Laterality: Right;     No Known Allergies  Current Outpatient Medications  Medication Sig Dispense Refill   aspirin EC 81 MG tablet Take 81 mg by mouth daily. Swallow whole.     atorvastatin (LIPITOR) 40 MG tablet TAKE 1 TABLET BY MOUTH EVERY DAY 90 tablet 2   clopidogrel (PLAVIX) 75 MG tablet Take 75 mg by mouth daily.     loratadine (CLARITIN) 10 MG tablet Take 10 mg by mouth every evening.     methocarbamol (ROBAXIN) 500 MG tablet Take 1 tablet (500 mg total) by mouth every 6 (six) hours as needed for muscle spasms. 30 tablet 1   metoprolol tartrate (LOPRESSOR) 25 MG tablet Take 1 tablet (25 mg total) by mouth 2 (two) times daily. 60 tablet 11   Multiple Vitamin (MULTIVITAMIN WITH MINERALS) TABS tablet Take 1 tablet by mouth daily.     Multiple Vitamins-Minerals (PRESERVISION AREDS 2 PO) Take 1 capsule by mouth in the morning and at bedtime.     Omega-3 Fatty Acids (FISH OIL) 1000 MG CAPS Take 1,000-2,000 mg by mouth See admin instructions. Take 2000 mg by mouth in the evening and 1000 mg at bedtime     pantoprazole (PROTONIX) 40 MG tablet Take 40 mg by mouth daily before breakfast.     Polyvinyl Alcohol-Povidone (REFRESH OP) Place 1 drop into both eyes in the morning.     promethazine (PHENERGAN) 12.5 MG tablet Take 1 tablet (12.5 mg total) by mouth every 8 (eight) hours as needed for nausea or vomiting. 30 tablet 0   ramipril (ALTACE) 5 MG capsule Take 5 mg by mouth in the morning.     traMADol (ULTRAM) 50 MG tablet Take 1 tablet (50 mg total) by mouth every 6 (six) hours as needed. 30 tablet 0   No current facility-administered medications for this visit.    Family History  Problem Relation Age of Onset   Hypertension Mother    Congestive Heart Failure Father     Social History   Socioeconomic History   Marital status: Married    Spouse name: Not on file   Number of children: Not on file   Years of education: 12   Highest education level: Not on file  Occupational History   Not on file   Tobacco Use   Smoking status: Former    Current packs/day: 0.00    Average packs/day: 3.0 packs/day for 16.0 years (48.0 ttl pk-yrs)    Types: Cigarettes    Start date: 02/04/1963    Quit date: 02/04/1979    Years since quitting: 44.8   Smokeless tobacco: Never  Vaping Use   Vaping status: Never Used  Substance and Sexual Activity   Alcohol use: Yes    Comment: occassionally   Drug use: No   Sexual activity: Not Currently  Other Topics Concern   Not on file  Social History Narrative   Lives at home  with his wife.   Step-children.   Right-handed.   3 cups caffeine per day.   Social Drivers of Corporate investment banker Strain: Not on file  Food Insecurity: No Food Insecurity (07/14/2023)   Hunger Vital Sign    Worried About Running Out of Food in the Last Year: Never true    Ran Out of Food in the Last Year: Never true  Transportation Needs: No Transportation Needs (07/14/2023)   PRAPARE - Administrator, Civil Service (Medical): No    Lack of Transportation (Non-Medical): No  Physical Activity: Not on file  Stress: Not on file  Social Connections: Not on file  Intimate Partner Violence: Not At Risk (07/14/2023)   Humiliation, Afraid, Rape, and Kick questionnaire    Fear of Current or Ex-Partner: No    Emotionally Abused: No    Physically Abused: No    Sexually Abused: No     REVIEW OF SYSTEMS:   [X]  denotes positive finding, [ ]  denotes negative finding Cardiac  Comments:  Chest pain or chest pressure:    Shortness of breath upon exertion:    Short of breath when lying flat:    Irregular heart rhythm:        Vascular    Pain in calf, thigh, or hip brought on by ambulation:    Pain in feet at night that wakes you up from your sleep:     Blood clot in your veins:    Leg swelling:         Pulmonary    Oxygen at home:    Productive cough:     Wheezing:         Neurologic    Sudden weakness in arms or legs:     Sudden numbness in arms or legs:      Sudden onset of difficulty speaking or slurred speech:    Temporary loss of vision in one eye:     Problems with dizziness:     Left sided facial numbness x   Gastrointestinal    Blood in stool:     Vomited blood:         Genitourinary    Burning when urinating:     Blood in urine:        Psychiatric    Major depression:         Hematologic    Bleeding problems:    Problems with blood clotting too easily:        Skin    Rashes or ulcers:        Constitutional    Fever or chills:      PHYSICAL EXAMINATION:  Today's Vitals   11/26/23 1136 11/26/23 1138  BP: 136/81 (!) 144/81  Pulse: 68   Resp: 18   Temp: 98.3 F (36.8 C)   TempSrc: Temporal   SpO2: 95%   Weight: 207 lb 1.6 oz (93.9 kg)   Height: 5' 7.5" (1.715 m)    Body mass index is 31.96 kg/m.   General:  WDWN in NAD; vital signs documented above Gait: Not observed HENT: WNL, normocephalic Pulmonary: normal non-labored breathing Cardiac: regular HR, without carotid bruits Abdomen: soft, NT; aortic pulse is not palpable Skin: without rashes Vascular Exam/Pulses:  Right Left  Radial 2+ (normal) 2+ (normal)  PT 2+ (normal) 2+ (normal)   Extremities: without open wounds Musculoskeletal: no muscle wasting or atrophy  Neurologic: A&O X 3; moving all extremities equally; speech is fluent/normal Psychiatric:  The pt has Normal affect.   Non-Invasive Vascular Imaging:   Carotid Duplex on 11/26/2023 Right:  patent stent without restenosis Left:  40-59% ICA stenosis (low end) Bilateral veterbral flow is antegrade   Previous Carotid duplex on 11/19/2022: Right: patent stent without re-stenosis Left:   1-39% ICA stenosis    ASSESSMENT/PLAN:: 73 y.o. male here for follow up carotid artery stenosis and s/p right TCAR for symptomatic carotid artery stenosis on 12/19/2021 by Dr. Karin Lieu.    -duplex today reveals patent right carotid stent without re-stenosis and the left by EDV is 1-39%.  By PSV, she is in  low end of 40-59%  -he has continued to have some left facial numbness.  Discussed with Dr. Karin Lieu and will refer pt back to see Dr. Pearlean Brownie.  Do not feel this is from his carotid artery stenosis.  -discussed s/s of stroke with pt and he understands should he develop any of these sx, he will go to the nearest ER or call 911. -pt will f/u in one year with carotid duplex -pt will call sooner should he have any issues. -continue statin/asa/Plavix   Doreatha Massed, [facility] [phone]  Clinic MD:  Karin Lieu

## 2023-12-04 ENCOUNTER — Other Ambulatory Visit: Payer: Self-pay | Admitting: Orthopaedic Surgery

## 2023-12-10 ENCOUNTER — Ambulatory Visit: Payer: Medicare Other | Admitting: Neurology

## 2023-12-10 ENCOUNTER — Encounter: Payer: Self-pay | Admitting: Neurology

## 2023-12-10 VITALS — BP 115/65 | HR 66 | Ht 67.0 in | Wt 208.8 lb

## 2023-12-10 DIAGNOSIS — R200 Anesthesia of skin: Secondary | ICD-10-CM | POA: Diagnosis not present

## 2023-12-10 DIAGNOSIS — Z8673 Personal history of transient ischemic attack (TIA), and cerebral infarction without residual deficits: Secondary | ICD-10-CM

## 2023-12-10 DIAGNOSIS — R251 Tremor, unspecified: Secondary | ICD-10-CM

## 2023-12-10 NOTE — Patient Instructions (Addendum)
 I had a long discussion with the patient and his wife regarding his remote stroke and persistent facial numbness and new complaints of hand tremors and answered questions.  I recommend further evaluation by checking thyroid function test, B12, follow-up lipid profile and hemoglobin A1c as well as MRI scan of the brain.  The tremor does not appear to be functionally disabling at the present time hence we will hold off on medications like Topamax.  Continue Plavix for stroke prevention and maintain aggressive risk factor modification with strict control of hypertension with blood pressure goal below 130/90, diabetes with hemoglobin A1c goal below 6.5% and lipids with LDL cholesterol goal below 70 mg/dL. I also advised the patient to eat a healthy diet with plenty of whole grains, cereals, fruits and vegetables, exercise regularly and maintain ideal body weight.  Follow-up in 6 months or call earlier if necessary.

## 2023-12-10 NOTE — Progress Notes (Signed)
 [name] [address] [place]. [place] [address] [phone]       OFFICE FOLLOW-UP VISIT NOTE  [name] Date of Birth:  Dec 14, 1950 Medical Record Number:  [id number]   Referring MD:  Nolon Bussing  Reason for Referral:  stroke and carotid stenosis  HPI: Initial visit 10/31/2021 [name] is a 73 year old Caucasian male seen today for initial office consultation visit.  Is accompanied by his wife.  History is obtained from them and review of electronic medical records and reviewed pertinent available imaging films in PACS.  He has past medical history of hypertension, myocardial infarction, coronary artery disease, prostatic hypertrophy, arthritis and kidney stones.  Patient states on 10/01/2021 he developed sudden onset of numbness and his left face.  He states this involves mostly his cheeks and lips up to the midline.  It also involves his temples to mild degree but is not sure if it involves his forehead or eyelids.  He also noticed some ringing sound in his left ear.  He denied any facial weakness, slurred speech, extremity weakness or numbness.  A few days prior he had noticed some numbness in his left hand which she did not stay long.  He denies any focal extremity weakness, gait or balance problems, headache, loss of vision, double vision or vertigo.  He does have a remote history of TIA about 20 years ago when he had some speech difficulties for a few days.  He was seen in the ER and sent home with placed on aspirin.  He had carotid ultrasound done as an outpatient which showed 79 9% right ICA stenosis with heterogeneous calcified plaque as well as 50% left ICA stenosis.  He was seen by Dr. Sherral Hammers who is a vascular surgeon who felt his symptoms of left facial numbness did not go along with right carotid stenosis and sent the patient to me for my opinion.  Patient underwent CT angiogram of the brain and neck on 10/16/2021 which showed 60% right ICA stenosis and  50% left ICA stenosis.  Left P2 posterior cerebral artery was occluded with CT scan showing area of vague hypodensity in the left hippocampus possibly subacute infarct.  Patient denies any symptoms of peripheral vision loss, short-term memory, gait or balance.  He was on aspirin and recently Plavix has been added as tolerating both medications well without bruising or bleeding.  His blood pressure is under good control and today it is 142/68.  He has been started on Lipitor 40 mg which is tolerating well without muscle aches or pains.  He denies any definitive symptoms of right hemispheric ischemia but MRI scan of the brain done on 10/16/2021 shows no acute infarcts but shows subtle FLAIR and T2 parietal and occipital cortical hyperintensities possibly subacute infarcts more on the right than the left.  Patient denies any history of atrial fibrillation, palpitations, syncope or passing out. Update 08/05/2022 : He returns for follow-up after last visit 9 months ago.  Patient states he is doing well and has not had any stroke or TIA symptoms.  He remains on aspirin and Plavix which is tolerating well but does have easy bruising though no bleeding.  His blood pressure is well controlled at home but it is slightly elevated in my office today at 152/76.  He is very active and independent in activities of daily living and plays regularly.  He had carotid ultrasound done on 01/17/2022 and vascular surgery office which showed no significant restenosis in the right  carotid stent.  30-day heart monitoring from 11/07/2021 through 12/11/2021 showed no evidence of atrial fibrillation.  LDL cholesterol on 12/20/2021 was 46 mg percent.  2D echo on 02/25/2022 shows ejection fraction of 55 to 60% and mild mitral regurg.  Patient still has some residual numbness on his left face that is occasional facial twitching's which he is aware of do not seem to bother anybody else.[name] Update 12/10/2023 : He returns for follow-up after last visit 4  months ago.  Patient states he is doing well and still has intermittent left facial numbness and tingling.  He has some good days and bad days.  He mentioned this to Dr. Sherral Hammers vascular surgeon was concerned and advised him to see me for the same.  He states he did have recent lab work at primary care physician's office and it was satisfactory.  He had recent carotid ultrasound on 11/26/2023 which showed patent right carotid stent without restenosis and mild 40-59% left ICA stenosis.  He is complains of tremor in his hands which she actually had even before his stroke seems to be more active now and bothering him.  The tremor is absent at rest and is noticeable with activities like holding a plate in his hands of trying to feed himself.  He still feels tremor is not disabling and interfering with his activities of daily living.  He denies any family history of tremor and is not aware of any effect of alcohol on the tremor. ROS:   14 system review of systems is positive for facial numbness, ringing in the ears, decreased hearing and all other systems negative  PMH:  Past Medical History:  Diagnosis Date   Anginal pain (HCC)    Arthritis    "probably"   BPH (benign prostatic hyperplasia)    Carotid artery occlusion    Coronary artery disease    Dyspnea    GERD (gastroesophageal reflux disease)    History of kidney stones    Hypertension    Kidney stones    Macular degeneration    Myocardial infarction (HCC)    Nausea and vomiting 02/06/2016   Seasonal allergies     Social History:  Social History   Socioeconomic History   Marital status: Married    Spouse name: Not on file   Number of children: Not on file   Years of education: 12   Highest education level: Not on file  Occupational History   Not on file  Tobacco Use   Smoking status: Former    Current packs/day: 0.00    Average packs/day: 3.0 packs/day for 16.0 years (48.0 ttl pk-yrs)    Types: Cigarettes    Start date:  02/04/1963    Quit date: 02/04/1979    Years since quitting: 44.8   Smokeless tobacco: Never  Vaping Use   Vaping status: Never Used  Substance and Sexual Activity   Alcohol use: Not Currently    Comment: occassionally   Drug use: No   Sexual activity: Not Currently  Other Topics Concern   Not on file  Social History Narrative   Lives at home with his wife.   Step-children.   Right-handed.   3 cups caffeine per day.   Social Drivers of Corporate investment banker Strain: Not on file  Food Insecurity: No Food Insecurity (07/14/2023)   Hunger Vital Sign    Worried About Running Out of Food in the Last Year: Never true    Ran Out of Food in the  Last Year: Never true  Transportation Needs: No Transportation Needs (07/14/2023)   PRAPARE - Administrator, Civil Service (Medical): No    Lack of Transportation (Non-Medical): No  Physical Activity: Not on file  Stress: Not on file  Social Connections: Not on file  Intimate Partner Violence: Not At Risk (07/14/2023)   Humiliation, Afraid, Rape, and Kick questionnaire    Fear of Current or Ex-Partner: No    Emotionally Abused: No    Physically Abused: No    Sexually Abused: No    Medications:   Current Outpatient Medications on File Prior to Visit  Medication Sig Dispense Refill   aspirin EC 81 MG tablet Take 81 mg by mouth daily. Swallow whole.     atorvastatin (LIPITOR) 40 MG tablet TAKE 1 TABLET BY MOUTH EVERY DAY 90 tablet 2   clopidogrel (PLAVIX) 75 MG tablet Take 75 mg by mouth daily.     loratadine (CLARITIN) 10 MG tablet Take 10 mg by mouth every evening.     methocarbamol (ROBAXIN) 500 MG tablet TAKE 1 TABLET BY MOUTH EVERY 6 HOURS AS NEEDED FOR MUSCLE SPASMS 30 tablet 1   metoprolol tartrate (LOPRESSOR) 25 MG tablet Take 1 tablet (25 mg total) by mouth 2 (two) times daily. 60 tablet 11   Multiple Vitamin (MULTIVITAMIN WITH MINERALS) TABS tablet Take 1 tablet by mouth daily.     Multiple Vitamins-Minerals  (PRESERVISION AREDS 2 PO) Take 1 capsule by mouth in the morning and at bedtime.     Omega-3 Fatty Acids (FISH OIL) 1000 MG CAPS Take 1,000-2,000 mg by mouth See admin instructions. Take 2000 mg by mouth in the evening and 1000 mg at bedtime     pantoprazole (PROTONIX) 40 MG tablet Take 40 mg by mouth daily before breakfast.     Polyvinyl Alcohol-Povidone (REFRESH OP) Place 1 drop into both eyes in the morning.     ramipril (ALTACE) 5 MG capsule Take 5 mg by mouth in the morning.     tamsulosin (FLOMAX) 0.4 MG CAPS capsule Take 0.4 mg by mouth daily.     traMADol (ULTRAM) 50 MG tablet Take 1 tablet (50 mg total) by mouth every 6 (six) hours as needed. (Patient taking differently: Take 50 mg by mouth every 6 (six) hours as needed. Takes as needed due to knee surgery) 30 tablet 0   promethazine (PHENERGAN) 12.5 MG tablet Take 1 tablet (12.5 mg total) by mouth every 8 (eight) hours as needed for nausea or vomiting. 30 tablet 0   No current facility-administered medications on file prior to visit.    Allergies:  No Known Allergies  Physical Exam General: well developed, well nourished elderly Caucasian male, seated, in no evident distress Head: head normocephalic and atraumatic.   Neck: supple with no carotid or supraclavicular bruits Cardiovascular: regular rate and rhythm, no murmurs Musculoskeletal: no deformity Skin:  no rash/petichiae Vascular:  Normal pulses all extremities  Neurologic Exam Mental Status: Awake and fully alert. Oriented to place and time. Recent and remote memory intact. Attention span, concentration and fund of knowledge appropriate. Mood and affect appropriate.  Absent glabellar tap. Cranial Nerves: Fundoscopic exam not done. Pupils equal, briskly reactive to light. Extraocular movements full without nystagmus. Visual fields full to confrontation. Hearing slightly diminished on the left.  Air conduction patient is still greater than bone conduction.. Facial  sensation Left lower face t. Face, tongue, palate moves normally and symmetrically.  Subjective left cheek numbness. Motor: Normal bulk and  tone. Normal strength in all tested extremity muscles.  Occasional minor bruising from the left lower half of the face.  Mild tremor of outstretched upper extremities left greater than right.  Absent at rest.  No cogwheel rigidity.  No pill-rolling quality. Sensory.: intact to touch , pinprick , position and vibratory sensation.  Coordination: Rapid alternating movements normal in all extremities. Finger-to-nose and heel-to-shin performed accurately bilaterally. Gait and Station: Arises from chair without difficulty. Stance is normal. Gait demonstrates normal stride length and balance . Able to heel, toe and tandem walk with mild difficulty.  No stooped posture.  Good postural response to threat. Reflexes: 1+ and symmetric. Toes downgoing.   NIHSS  1 Modified Rankin  2   ASSESSMENT: 73 year old Caucasian male with sudden onset of left facial numbness and some hearing loss possibly from small brainstem infarct not visualized on MRI as it was done 2 weeks later likely small vessel disease but abnormal neurovascular studies suggesting moderate right carotid, mild left carotid and severe intracranial left PCA atherosclerotic disease.  Vascular risk factors of hyperlipidemia and hypertension and intra and extracranial atherosclerosis.  Abnormal MRI scan showing bilateral small cortical parieto-occipital silent infarcts.  Longstanding mild action tremor upper extremities possibly benign essential but nondisabling     PLAN: I had a long discussion with the patient and his wife regarding his remote stroke and persistent facial numbness and new complaints of hand tremors and answered questions.  I recommend further evaluation by checking thyroid function test, B12, follow-up lipid profile and hemoglobin A1c as well as MRI scan of the brain.  The tremor does not appear to  be functionally disabling at the present time hence we will hold off on medications like Topamax.  Continue Plavix for stroke prevention and maintain aggressive risk factor modification with strict control of hypertension with blood pressure goal below 130/90, diabetes with hemoglobin A1c goal below 6.5% and lipids with LDL cholesterol goal below 70 mg/dL. I also advised the patient to eat a healthy diet with plenty of whole grains, cereals, fruits and vegetables, exercise regularly and maintain ideal body weight.  Follow-up in 6 months or call earlier if necessary.Greater than 50% time during this 40 minute  visit was spent on counseling and coordination of care about his numbness and carotid stenosis and discussion about stroke risk, prevention and treatment and answering questions. Delia Heady, MD  Note: This document was prepared with digital dictation and possible smart phrase technology. Any transcriptional errors that result from this process are unintentional. Tells me.  [id number]

## 2023-12-11 LAB — HEMOGLOBIN A1C
Est. average glucose Bld gHb Est-mCnc: 126 mg/dL
Hgb A1c MFr Bld: 6 % — ABNORMAL HIGH (ref 4.8–5.6)

## 2023-12-11 LAB — THYROID PANEL WITH TSH
Free Thyroxine Index: 2.4 (ref 1.2–4.9)
T3 Uptake Ratio: 28 % (ref 24–39)
T4, Total: 8.4 ug/dL (ref 4.5–12.0)
TSH: 1.13 u[IU]/mL (ref 0.450–4.500)

## 2023-12-11 LAB — LIPID PANEL
Chol/HDL Ratio: 2.7 ratio (ref 0.0–5.0)
Cholesterol, Total: 118 mg/dL (ref 100–199)
HDL: 44 mg/dL (ref >39)
LDL Chol Calc (NIH): 49 mg/dL (ref 0–99)
Triglycerides: 149 mg/dL (ref 0–149)
VLDL Cholesterol Cal: 25 mg/dL (ref 5–40)

## 2023-12-11 LAB — VITAMIN B12: Vitamin B-12: 425 pg/mL (ref 232–1245)

## 2023-12-14 ENCOUNTER — Telehealth: Payer: Self-pay | Admitting: Neurology

## 2023-12-14 NOTE — Telephone Encounter (Signed)
 no auth required sent to GI [phone]

## 2023-12-15 NOTE — Progress Notes (Signed)
 Kindly advise the patient that cholesterol profile, thyroid panel and vitamin B12 are all normal.  Screening test for diabetes is borderline send kindly see primary care physician for advice on how to lose weight and eat healthy

## 2023-12-16 ENCOUNTER — Ambulatory Visit: Payer: Medicare Other | Admitting: Urology

## 2023-12-16 ENCOUNTER — Encounter: Payer: Self-pay | Admitting: Urology

## 2023-12-16 VITALS — BP 154/76 | HR 62 | Temp 97.6°F

## 2023-12-16 DIAGNOSIS — N400 Enlarged prostate without lower urinary tract symptoms: Secondary | ICD-10-CM

## 2023-12-16 DIAGNOSIS — N200 Calculus of kidney: Secondary | ICD-10-CM | POA: Diagnosis not present

## 2023-12-16 DIAGNOSIS — Z87442 Personal history of urinary calculi: Secondary | ICD-10-CM

## 2023-12-16 LAB — URINALYSIS, ROUTINE W REFLEX MICROSCOPIC
Bilirubin, UA: NEGATIVE
Glucose, UA: NEGATIVE
Ketones, UA: NEGATIVE
Leukocytes,UA: NEGATIVE
Nitrite, UA: NEGATIVE
Protein,UA: NEGATIVE
RBC, UA: NEGATIVE
Specific Gravity, UA: 1.015 (ref 1.005–1.030)
Urobilinogen, Ur: 0.2 mg/dL (ref 0.2–1.0)
pH, UA: 6.5 (ref 5.0–7.5)

## 2023-12-16 NOTE — Progress Notes (Signed)
 Name: Scott Jacobson DOB: Sep 09, 1951 MRN: [id number]  History of Present Illness: Scott Jacobson is a 73 y.o. male who presents today for follow up visit at [facility] [place].  - GU history: 1. BPH. Taking Flomax 0.4 mg daily. 2. Kidney stones. Passed stone spontaneously about 25 years ago. Denies history of prior stone surgery. 3. Left adrenal adenoma.  - Per CT on 06/09/2023: "2 cm adenoma of the left adrenal gland, Hounsfield density is 9.2. No follow-up imaging is needed." 4. Right renal cyst. - Per CT on 06/09/2023: Bosniak 1; No follow-up imaging is recommended.  At initial visit on 06/15/2023: - Seen for follow up after ER visit on 06/09/2023 for right flank pain with CT finding of a 4 mm proximal right ureteral stone. Also had a few punctate nonobstructive caliceal stones in the right kidney and a 3 mm solitary nonobstructing caliceal stone inferiorly in the left kidney. - Asymptomatic at that time. - KUB: "Negative. Right mid ureteral stone observed on CT is not well-visualized."  Since last visit: > 06/25/2023: MRI abdomen w/wo contrast showed: - Unchanged definitively benign macroscopic fat containing left adrenal adenoma, for which no further follow-up or characterization is required.  - Simple, benign right renal cortical cysts, for which no further follow-up or characterization is required.  - Kidneys are otherwise normal, without obvious renal calculi by MR, solid lesion, or hydronephrosis.  > 07/16/2023 KUB: No radio-opaque calculi.   Today: He reports doing well with no urologic concerns today.  He denies increased urinary urgency, frequency, nocturia, dysuria, gross hematuria, hesitancy, straining to void, or sensations of incomplete emptying.  He denies flank pain or abdominal pain.   Medications: Current Outpatient Medications  Medication Sig Dispense Refill   aspirin EC 81 MG tablet Take 81 mg by mouth daily. Swallow whole.     atorvastatin (LIPITOR) 40 MG  tablet TAKE 1 TABLET BY MOUTH EVERY DAY 90 tablet 2   clopidogrel (PLAVIX) 75 MG tablet Take 75 mg by mouth daily.     loratadine (CLARITIN) 10 MG tablet Take 10 mg by mouth every evening.     methocarbamol (ROBAXIN) 500 MG tablet TAKE 1 TABLET BY MOUTH EVERY 6 HOURS AS NEEDED FOR MUSCLE SPASMS 30 tablet 1   metoprolol tartrate (LOPRESSOR) 25 MG tablet Take 1 tablet (25 mg total) by mouth 2 (two) times daily. 60 tablet 11   Multiple Vitamin (MULTIVITAMIN WITH MINERALS) TABS tablet Take 1 tablet by mouth daily.     Multiple Vitamins-Minerals (PRESERVISION AREDS 2 PO) Take 1 capsule by mouth in the morning and at bedtime.     Omega-3 Fatty Acids (FISH OIL) 1000 MG CAPS Take 1,000-2,000 mg by mouth See admin instructions. Take 2000 mg by mouth in the evening and 1000 mg at bedtime     pantoprazole (PROTONIX) 40 MG tablet Take 40 mg by mouth daily before breakfast.     Polyvinyl Alcohol-Povidone (REFRESH OP) Place 1 drop into both eyes in the morning.     ramipril (ALTACE) 5 MG capsule Take 5 mg by mouth in the morning.     tamsulosin (FLOMAX) 0.4 MG CAPS capsule Take 0.4 mg by mouth daily.     promethazine (PHENERGAN) 12.5 MG tablet Take 1 tablet (12.5 mg total) by mouth every 8 (eight) hours as needed for nausea or vomiting. 30 tablet 0   traMADol (ULTRAM) 50 MG tablet Take 1 tablet (50 mg total) by mouth every 6 (six) hours as needed. (Patient not taking:  Reported on 12/16/2023) 30 tablet 0   No current facility-administered medications for this visit.    Allergies: No Known Allergies  Past Medical History:  Diagnosis Date   Anginal pain (HCC)    Arthritis    "probably"   BPH (benign prostatic hyperplasia)    Carotid artery occlusion    Coronary artery disease    Dyspnea    GERD (gastroesophageal reflux disease)    History of kidney stones    Hypertension    Kidney stones    Macular degeneration    Myocardial infarction [facility])    Nausea and vomiting 02/06/2016   Seasonal allergies     Past Surgical History:  Procedure Laterality Date   APPENDECTOMY     BACK SURGERY     CARDIAC CATHETERIZATION N/A 08/21/2016   Procedure: Left Heart Cath and Coronary Angiography;  Surgeon: Kathleene Hazel, MD;  Location: [facility] INVASIVE CV LAB;  Service: Cardiovascular;  Laterality: N/A;   CARDIAC CATHETERIZATION N/A 08/21/2016   Procedure: Coronary Stent Intervention;  Surgeon: Kathleene Hazel, MD;  Location: [facility] INVASIVE CV LAB;  Service: Cardiovascular;  Laterality: N/A;   CARDIAC SURGERY     quadruple bypass   CATARACT EXTRACTION W/ INTRAOCULAR LENS  IMPLANT, BILATERAL Bilateral    COLONOSCOPY     COLONOSCOPY WITH PROPOFOL N/A 03/05/2021   Procedure: COLONOSCOPY WITH PROPOFOL;  Surgeon: Dolores Frame, MD;  Location: [facility];  Service: Gastroenterology;  Laterality: N/A;  11:00   CORONARY ARTERY BYPASS GRAFT N/A 10/03/2016   Procedure: CORONARY ARTERY BYPASS GRAFTING (CABG)x 4 with endoscopic harvesting of right saphenous vein ,LIMA-LAD SVG-DIAG SVG-OM SVG-RCA;  Surgeon: Alleen Borne, MD;  Location: [facility] OR;  Service: Open Heart Surgery;  Laterality: N/A;   EYE SURGERY     GAS INSERTION Right 02/05/2016   Procedure: INSERTION OF GAS-[id number];  Surgeon: Sherrie George, MD;  Location: [facility] OR;  Service: Ophthalmology;  Laterality: Right;   HAND SURGERY Right    thumb; S/P "cut his arm"   LASER PHOTO ABLATION Right 02/05/2016   Procedure: LASER PHOTO ABLATION-HEADSCOPE LASER;  Surgeon: Sherrie George, MD;  Location: [facility] OR;  Service: Ophthalmology;  Laterality: Right;   LUMBAR DISC SURGERY  ~ 2002   POLYPECTOMY  03/05/2021   Procedure: POLYPECTOMY;  Surgeon: Marguerita Merles, Reuel Boom, MD;  Location: [facility];  Service: Gastroenterology;;  ascending colon, sigmoid colon   RETINAL DETACHMENT REPAIR W/ SCLERAL BUCKLE LE Right 02/05/2016   SCLERAL BUCKLE Right 02/05/2016   Procedure: SCLERAL BUCKLE;  Surgeon: Sherrie George, MD;  Location: [facility] OR;  Service:  Ophthalmology;  Laterality: Right;   TEE WITHOUT CARDIOVERSION N/A 10/03/2016   Procedure: TRANSESOPHAGEAL ECHOCARDIOGRAM (TEE);  Surgeon: Alleen Borne, MD;  Location: [facility] OR;  Service: Open Heart Surgery;  Laterality: N/A;   TONSILLECTOMY     TOTAL KNEE ARTHROPLASTY Right 07/14/2023   Procedure: RIGHT TOTAL KNEE ARTHROPLASTY;  Surgeon: Kathryne Hitch, MD;  Location: [facility] OR;  Service: Orthopedics;  Laterality: Right;   TRANSCAROTID ARTERY REVASCULARIZATION  Right 12/19/2021   Procedure: Right Transcarotid Artery Revascularization;  Surgeon: Victorino Sparrow, MD;  Location: [facility] OR;  Service: Vascular;  Laterality: Right;   Family History  Problem Relation Age of Onset   Hypertension Mother    Congestive Heart Failure Father    Social History   Socioeconomic History   Marital status: Married    Spouse name: Not on file   Number of children: Not on file  Years of education: 32   Highest education level: Not on file  Occupational History   Not on file  Tobacco Use   Smoking status: Former    Current packs/day: 0.00    Average packs/day: 3.0 packs/day for 16.0 years (48.0 ttl pk-yrs)    Types: Cigarettes    Start date: 02/04/1963    Quit date: 02/04/1979    Years since quitting: 44.8   Smokeless tobacco: Never  Vaping Use   Vaping status: Never Used  Substance and Sexual Activity   Alcohol use: Not Currently    Comment: occassionally   Drug use: No   Sexual activity: Not Currently  Other Topics Concern   Not on file  Social History Narrative   Lives at home with his wife.   Step-children.   Right-handed.   3 cups caffeine per day.   Social Drivers of Corporate investment banker Strain: Not on file  Food Insecurity: No Food Insecurity (07/14/2023)   Hunger Vital Sign    Worried About Running Out of Food in the Last Year: Never true    Ran Out of Food in the Last Year: Never true  Transportation Needs: No Transportation Needs (07/14/2023)   PRAPARE - Therapist, art (Medical): No    Lack of Transportation (Non-Medical): No  Physical Activity: Not on file  Stress: Not on file  Social Connections: Not on file  Intimate Partner Violence: Not At Risk (07/14/2023)   Humiliation, Afraid, Rape, and Kick questionnaire    Fear of Current or Ex-Partner: No    Emotionally Abused: No    Physically Abused: No    Sexually Abused: No    Review of Systems Constitutional: Patient denies any unintentional weight loss or change in strength lntegumentary: Patient denies any rashes or pruritus Cardiovascular: Patient denies chest pain or syncope Respiratory: Patient denies shortness of breath Gastrointestinal: Patient denies nausea or vomiting Musculoskeletal: Patient denies muscle cramps or weakness Neurologic: Patient denies convulsions or seizures Allergic/Immunologic: Patient denies recent allergic reaction(s) Hematologic/Lymphatic: Patient denies bleeding tendencies Endocrine: Patient denies heat/cold intolerance  GU: As per HPI.  OBJECTIVE Vitals:   12/16/23 1111  BP: (!) 154/76  Pulse: 62  Temp: 97.6 F (36.4 C)   There is no height or weight on file to calculate BMI.  Physical Examination Constitutional: No obvious distress; patient is non-toxic appearing  Cardiovascular: No visible lower extremity edema.  Respiratory: The patient does not have audible wheezing/stridor; respirations do not appear labored  Gastrointestinal: Abdomen non-distended Musculoskeletal: Normal ROM of UEs  Skin: No obvious rashes/open sores  Neurologic: CN 2-12 grossly intact Psychiatric: Answered questions appropriately with normal affect  Hematologic/Lymphatic/Immunologic: No obvious bruises or sites of spontaneous bleeding  UA: negative  PVR: 24 ml  ASSESSMENT Kidney stones - Plan: Urinalysis, Routine w reflex microscopic, BLADDER SCAN AMB NON-IMAGING, DG Abd 1 View  Benign prostatic hyperplasia, unspecified whether lower urinary tract  symptoms present - Plan: Urinalysis, Routine w reflex microscopic, BLADDER SCAN AMB NON-IMAGING  We reviewed his history and results in detail. No acute concerns. We agreed he will continue Flomax (Tamsulosin) 0.4 mg daily as prescribed by his PCP. We agreed to plan for follow up in 1 year with KUB for stone surveillance or sooner if needed. Patient verbalized understanding of and agreement with current plan. All questions were answered.  PLAN Advised the following: 1. Continue Flomax (Tamsulosin) 0.4 mg daily. 2. Return in about 1 year (around 12/15/2024) for KUB, UA,  PVR, & f/u with Evette Georges NP.  Orders Placed This Encounter  Procedures   DG Abd 1 View    Standing Status:   Future    Expected Date:   12/15/2024    Expiration Date:   12/15/2024    Reason for Exam (SYMPTOM  OR DIAGNOSIS REQUIRED):   kidney stone    Preferred imaging location?:   [facility]   Urinalysis, Routine w reflex microscopic   BLADDER SCAN AMB NON-IMAGING    It has been explained that the patient is to follow regularly with their PCP in addition to all other providers involved in their care and to follow instructions provided by these respective offices. Patient advised to contact urology clinic if any urologic-pertaining questions, concerns, new symptoms or problems arise in the interim period.  There are no Patient Instructions on file for this visit.  Electronically signed by:  Donnita Falls, FNP   12/16/23    11:21 AM

## 2023-12-21 DIAGNOSIS — H40011 Open angle with borderline findings, low risk, right eye: Secondary | ICD-10-CM | POA: Diagnosis not present

## 2023-12-21 DIAGNOSIS — H524 Presbyopia: Secondary | ICD-10-CM | POA: Diagnosis not present

## 2023-12-23 ENCOUNTER — Ambulatory Visit: Payer: Medicare Other | Admitting: Orthopaedic Surgery

## 2023-12-23 ENCOUNTER — Other Ambulatory Visit (INDEPENDENT_AMBULATORY_CARE_PROVIDER_SITE_OTHER): Payer: Self-pay

## 2023-12-23 ENCOUNTER — Encounter: Payer: Self-pay | Admitting: Orthopaedic Surgery

## 2023-12-23 DIAGNOSIS — Z96651 Presence of right artificial knee joint: Secondary | ICD-10-CM | POA: Diagnosis not present

## 2023-12-23 NOTE — Progress Notes (Signed)
 The patient is a 73 year old gentleman who is now 5 months status post a right total knee replacement.  He is active.  He says doing pretty well and he has good range of motion and strength with the knee does have clicking noises.  His right knee moves smoothly and fluidly with almost full range of motion.  It feels ligamentously stable as well and there is some slight clicking from the implants themselves.  There is just only slight swelling with his knee.  Overall though he looks good I do not feel stable.  2 views of the right knee show well-seated press-fit total knee arthroplasty with no complicating features.  He will continue to work on quad strengthening exercises and increase his activities as comfort allows.  I think he is going to continue to improve with time.  Will see him back for final visit in 6 months with a final standing AP and lateral of that right knee.  If there are issues before then he knows to reach out and let us know.

## 2024-01-07 ENCOUNTER — Other Ambulatory Visit: Payer: Self-pay | Admitting: Orthopaedic Surgery

## 2024-01-14 ENCOUNTER — Encounter (INDEPENDENT_AMBULATORY_CARE_PROVIDER_SITE_OTHER): Payer: Self-pay | Admitting: *Deleted

## 2024-01-16 ENCOUNTER — Ambulatory Visit
Admission: RE | Admit: 2024-01-16 | Discharge: 2024-01-16 | Disposition: A | Discharge status: Home / Self Care | Source: Ambulatory Visit | Attending: Neurology | Admitting: Neurology

## 2024-01-16 DIAGNOSIS — R251 Tremor, unspecified: Secondary | ICD-10-CM | POA: Diagnosis not present

## 2024-01-16 MED ORDER — GADOPICLENOL 0.5 MMOL/ML IV SOLN
9.0000 mL | Freq: Once | INTRAVENOUS | Status: AC | PRN
  Administered 2024-01-16: 9 mL via INTRAVENOUS

## 2024-01-21 DIAGNOSIS — M8580 Other specified disorders of bone density and structure, unspecified site: Secondary | ICD-10-CM | POA: Diagnosis not present

## 2024-01-21 DIAGNOSIS — Z13820 Encounter for screening for osteoporosis: Secondary | ICD-10-CM | POA: Diagnosis not present

## 2024-01-21 DIAGNOSIS — M8588 Other specified disorders of bone density and structure, other site: Secondary | ICD-10-CM | POA: Diagnosis not present

## 2024-01-21 DIAGNOSIS — M810 Age-related osteoporosis without current pathological fracture: Secondary | ICD-10-CM | POA: Diagnosis not present

## 2024-01-24 NOTE — Progress Notes (Signed)
 Kindly inform the patient that MRI scan of the brain shows couple of small strokes in the top of the brain on either side and mild age-related changes of hardening of the arteries and shrinkage of the brain.  No new or worrisome finding.

## 2024-02-05 ENCOUNTER — Other Ambulatory Visit: Payer: Self-pay | Admitting: Orthopaedic Surgery

## 2024-02-24 DIAGNOSIS — H40011 Open angle with borderline findings, low risk, right eye: Secondary | ICD-10-CM | POA: Diagnosis not present

## 2024-03-01 DIAGNOSIS — I10 Essential (primary) hypertension: Secondary | ICD-10-CM | POA: Diagnosis not present

## 2024-03-01 DIAGNOSIS — K219 Gastro-esophageal reflux disease without esophagitis: Secondary | ICD-10-CM | POA: Diagnosis not present

## 2024-03-01 DIAGNOSIS — Z8673 Personal history of transient ischemic attack (TIA), and cerebral infarction without residual deficits: Secondary | ICD-10-CM | POA: Diagnosis not present

## 2024-03-01 DIAGNOSIS — I25798 Atherosclerosis of other coronary artery bypass graft(s) with other forms of angina pectoris: Secondary | ICD-10-CM | POA: Diagnosis not present

## 2024-03-01 DIAGNOSIS — E7849 Other hyperlipidemia: Secondary | ICD-10-CM | POA: Diagnosis not present

## 2024-03-09 ENCOUNTER — Other Ambulatory Visit: Payer: Self-pay | Admitting: Orthopaedic Surgery

## 2024-05-05 ENCOUNTER — Ambulatory Visit: Admitting: Cardiovascular Disease

## 2024-05-14 ENCOUNTER — Other Ambulatory Visit: Payer: Self-pay

## 2024-05-14 ENCOUNTER — Emergency Department (HOSPITAL_COMMUNITY)
Admission: EM | Admit: 2024-05-14 | Discharge: 2024-05-14 | Disposition: A | Discharge status: Home / Self Care | Attending: Emergency Medicine | Admitting: Emergency Medicine

## 2024-05-14 ENCOUNTER — Emergency Department (HOSPITAL_COMMUNITY)

## 2024-05-14 ENCOUNTER — Encounter (HOSPITAL_COMMUNITY): Payer: Self-pay | Admitting: Emergency Medicine

## 2024-05-14 DIAGNOSIS — W010XXA Fall on same level from slipping, tripping and stumbling without subsequent striking against object, initial encounter: Secondary | ICD-10-CM | POA: Diagnosis not present

## 2024-05-14 DIAGNOSIS — Z951 Presence of aortocoronary bypass graft: Secondary | ICD-10-CM | POA: Insufficient documentation

## 2024-05-14 DIAGNOSIS — M25551 Pain in right hip: Secondary | ICD-10-CM | POA: Insufficient documentation

## 2024-05-14 DIAGNOSIS — Y92019 Unspecified place in single-family (private) house as the place of occurrence of the external cause: Secondary | ICD-10-CM | POA: Diagnosis not present

## 2024-05-14 DIAGNOSIS — M545 Low back pain, unspecified: Secondary | ICD-10-CM | POA: Insufficient documentation

## 2024-05-14 DIAGNOSIS — M533 Sacrococcygeal disorders, not elsewhere classified: Secondary | ICD-10-CM | POA: Diagnosis not present

## 2024-05-14 DIAGNOSIS — M47816 Spondylosis without myelopathy or radiculopathy, lumbar region: Secondary | ICD-10-CM | POA: Diagnosis not present

## 2024-05-14 DIAGNOSIS — Z7982 Long term (current) use of aspirin: Secondary | ICD-10-CM | POA: Insufficient documentation

## 2024-05-14 DIAGNOSIS — W19XXXA Unspecified fall, initial encounter: Secondary | ICD-10-CM

## 2024-05-14 DIAGNOSIS — I2510 Atherosclerotic heart disease of native coronary artery without angina pectoris: Secondary | ICD-10-CM | POA: Insufficient documentation

## 2024-05-14 DIAGNOSIS — Z79899 Other long term (current) drug therapy: Secondary | ICD-10-CM | POA: Insufficient documentation

## 2024-05-14 DIAGNOSIS — Z7902 Long term (current) use of antithrombotics/antiplatelets: Secondary | ICD-10-CM | POA: Diagnosis not present

## 2024-05-14 DIAGNOSIS — K5730 Diverticulosis of large intestine without perforation or abscess without bleeding: Secondary | ICD-10-CM | POA: Diagnosis not present

## 2024-05-14 DIAGNOSIS — K4090 Unilateral inguinal hernia, without obstruction or gangrene, not specified as recurrent: Secondary | ICD-10-CM | POA: Diagnosis not present

## 2024-05-14 DIAGNOSIS — Y93E2 Activity, laundry: Secondary | ICD-10-CM | POA: Insufficient documentation

## 2024-05-14 DIAGNOSIS — I10 Essential (primary) hypertension: Secondary | ICD-10-CM | POA: Insufficient documentation

## 2024-05-14 MED ORDER — TRAMADOL HCL 50 MG PO TABS: 50.0000 mg | ORAL_TABLET | Freq: Four times a day (QID) | ORAL | 1 refills | Status: DC | PRN

## 2024-05-14 NOTE — ED Triage Notes (Signed)
 Pt fell yesterday when pressure washing his house and tripped on ramp. Landed on right side. Endorses pain to right hip, sacral bone and lower back. Denies hitting head, pt on plavix . Pt took tramadol  and muscle relaxer this am.

## 2024-05-14 NOTE — Discharge Instructions (Signed)
 You were evaluated in the emergency room following a fall.  Your imaging did not show any significant abnormality.  I would recommend alternating Tylenol , ibuprofen and you can use the tramadol  as well for pain.  If your symptoms persist please call the number on the sheet to follow-up with orthopedics.

## 2024-05-14 NOTE — ED Provider Notes (Signed)
 [place] EMERGENCY DEPARTMENT AT [facility] Provider Note   CSN: [id number] Arrival date & time: 05/14/24  9043     Patient presents with: Scott Jacobson is a 73 y.o. male history of CAD, MI status post CABG on Plavix , hypertension, BPH presents following mechanical fall yesterday while pressure washing.  Patient states he fell on his backside and his left elbow.  He denies any has had a loss of consciousness.  Is without any complaints of headache, vision changes or vomiting.  He only complains of sacral pain and into his right lower lumbar.  Denies any new radicular symptoms.  No urinary or fecal incontinence.  He is still able to walk with describes some discomfort with doing so.  Does report a history of  lumbar disc surgery.    Fall   Past Medical History:  Diagnosis Date   Anginal pain (HCC)    Arthritis    probably   BPH (benign prostatic hyperplasia)    Carotid artery occlusion    Coronary artery disease    Dyspnea    GERD (gastroesophageal reflux disease)    History of kidney stones    Hypertension    Kidney stones    Macular degeneration    Myocardial infarction [facility])    Nausea and vomiting 02/06/2016   Seasonal allergies    Past Surgical History:  Procedure Laterality Date   APPENDECTOMY     BACK SURGERY     CARDIAC CATHETERIZATION N/A 08/21/2016   Procedure: Left Heart Cath and Coronary Angiography;  Surgeon: Lonni JONETTA Cash, MD;  Location: [facility] INVASIVE CV LAB;  Service: Cardiovascular;  Laterality: N/A;   CARDIAC CATHETERIZATION N/A 08/21/2016   Procedure: Coronary Stent Intervention;  Surgeon: Lonni JONETTA Cash, MD;  Location: [facility] INVASIVE CV LAB;  Service: Cardiovascular;  Laterality: N/A;   CARDIAC SURGERY     quadruple bypass   CATARACT EXTRACTION W/ INTRAOCULAR LENS  IMPLANT, BILATERAL Bilateral    COLONOSCOPY     COLONOSCOPY WITH PROPOFOL  N/A 03/05/2021   Procedure: COLONOSCOPY WITH PROPOFOL ;  Surgeon: Eartha Angelia Sieving, MD;  Location: [facility] ENDO SUITE;  Service: Gastroenterology;  Laterality: N/A;  11:00   CORONARY ARTERY BYPASS GRAFT N/A 10/03/2016   Procedure: CORONARY ARTERY BYPASS GRAFTING (CABG)x 4 with endoscopic harvesting of right saphenous vein ,LIMA-LAD SVG-DIAG SVG-OM SVG-RCA;  Surgeon: Dorise MARLA Fellers, MD;  Location: [facility] OR;  Service: Open Heart Surgery;  Laterality: N/A;   EYE SURGERY     GAS INSERTION Right 02/05/2016   Procedure: INSERTION OF GAS-C3F8;  Surgeon: Norleen JONETTA Ku, MD;  Location: [facility] OR;  Service: Ophthalmology;  Laterality: Right;   HAND SURGERY Right    thumb; S/P cut his arm   LASER PHOTO ABLATION Right 02/05/2016   Procedure: LASER PHOTO ABLATION-HEADSCOPE LASER;  Surgeon: Norleen JONETTA Ku, MD;  Location: [facility] OR;  Service: Ophthalmology;  Laterality: Right;   LUMBAR DISC SURGERY  ~ 2002   POLYPECTOMY  03/05/2021   Procedure: POLYPECTOMY;  Surgeon: Eartha Angelia, Sieving, MD;  Location: [facility]ITE;  Service: Gastroenterology;;  ascending colon, sigmoid colon   RETINAL DETACHMENT REPAIR W/ SCLERAL BUCKLE LE Right 02/05/2016   SCLERAL BUCKLE Right 02/05/2016   Procedure: SCLERAL BUCKLE;  Surgeon: Norleen JONETTA Ku, MD;  Location: [facility] OR;  Service: Ophthalmology;  Laterality: Right;   TEE WITHOUT CARDIOVERSION N/A 10/03/2016   Procedure: TRANSESOPHAGEAL ECHOCARDIOGRAM (TEE);  Surgeon: Dorise MARLA Fellers, MD;  Location: [facility] OR;  Service: Open Heart  Surgery;  Laterality: N/A;   TONSILLECTOMY     TOTAL KNEE ARTHROPLASTY Right 07/14/2023   Procedure: RIGHT TOTAL KNEE ARTHROPLASTY;  Surgeon: Vernetta Lonni GRADE, MD;  Location: [facility] OR;  Service: Orthopedics;  Laterality: Right;   TRANSCAROTID ARTERY REVASCULARIZATION  Right 12/19/2021   Procedure: Right Transcarotid Artery Revascularization;  Surgeon: Lanis Fonda BRAVO, MD;  Location: [facility] OR;  Service: Vascular;  Laterality: Right;       Prior to Admission medications   Medication Sig Start Date End Date Taking? Authorizing  Provider  aspirin  EC 81 MG tablet Take 81 mg by mouth daily. Swallow whole.    [provider]  atorvastatin  (LIPITOR ) 40 MG tablet TAKE 1 TABLET BY MOUTH EVERY DAY 05/01/20   Verlin Lonni BIRCH, MD  clopidogrel  (PLAVIX ) 75 MG tablet Take 75 mg by mouth daily. 10/01/21   [provider]  loratadine  (CLARITIN ) 10 MG tablet Take 10 mg by mouth every evening.    [provider]  methocarbamol  (ROBAXIN ) 500 MG tablet TAKE 1 TABLET BY MOUTH EVERY 6 HOURS AS NEEDED FOR MUSCLE SPASMS 03/09/24   Gretta Bertrum ORN, PA-C  metoprolol  tartrate (LOPRESSOR ) 25 MG tablet Take 1 tablet (25 mg total) by mouth 2 (two) times daily. 08/23/16   Lelon Hamilton T, PA-C  Multiple Vitamin (MULTIVITAMIN WITH MINERALS) TABS tablet Take 1 tablet by mouth daily.    [provider]  Multiple Vitamins-Minerals (PRESERVISION AREDS 2 PO) Take 1 capsule by mouth in the morning and at bedtime.    [provider]  Omega-3 Fatty Acids (FISH OIL) 1000 MG CAPS Take 1,000-2,000 mg by mouth See admin instructions. Take 2000 mg by mouth in the evening and 1000 mg at bedtime    [provider]  pantoprazole  (PROTONIX ) 40 MG tablet Take 40 mg by mouth daily before breakfast.    [provider]  Polyvinyl Alcohol -Povidone (REFRESH OP) Place 1 drop into both eyes in the morning.    [provider]  promethazine  (PHENERGAN ) 12.5 MG tablet Take 1 tablet (12.5 mg total) by mouth every 8 (eight) hours as needed for nausea or vomiting. 07/18/23   Vernetta Lonni GRADE, MD  ramipril  (ALTACE ) 5 MG capsule Take 5 mg by mouth in the morning. 01/16/17   [provider]  tamsulosin  (FLOMAX ) 0.4 MG CAPS capsule Take 0.4 mg by mouth daily. 10/09/23   [provider]  traMADol  (ULTRAM ) 50 MG tablet TAKE 1 TABLET BY MOUTH EVERY 6 HOURS AS NEEDED 02/05/24   Vernetta Lonni GRADE, MD    Allergies: Hydrocodone  and Percocet [oxycodone -acetaminophen ]    Review of Systems   Musculoskeletal:  Positive for arthralgias.    Updated Vital Signs BP (!) 157/70 (BP Location: Left Arm)   Pulse 68   Temp 97.9 F (36.6 C) (Oral)   Resp 18   Ht 5' 7.5 (1.715 m)   Wt 92.5 kg   SpO2 98%   BMI 31.48 kg/m   Physical Exam Vitals and nursing note reviewed.  Constitutional:      General: He is not in acute distress.    Appearance: He is well-developed.  HENT:     Head: Normocephalic and atraumatic.  Eyes:     Conjunctiva/sclera: Conjunctivae normal.  Cardiovascular:     Rate and Rhythm: Normal rate and regular rhythm.     Heart sounds: No murmur heard. Pulmonary:     Effort: Pulmonary effort is normal. No respiratory distress.     Breath sounds: Normal breath sounds.  Abdominal:     Palpations: Abdomen is soft.     Tenderness: There is no abdominal tenderness.  Musculoskeletal:     Cervical back: Neck supple.     Comments: Tenderness over sacrum, no lateral hip tenderness or discomfort with ranging lower extremities, some ecchymosis along the lateral aspect of the left forearm, no associated tenderness or hematoma, radial pulse 2+, and PT pulse 2+, compartments are soft, no midline spinal tenderness some mild right lower lumbar paraspinal tenderness  Skin:    General: Skin is warm and dry.     Capillary Refill: Capillary refill takes less than 2 seconds.  Neurological:     Mental Status: He is alert.  Psychiatric:        Mood and Affect: Mood normal.     (all labs ordered are listed, but only abnormal results are displayed) Labs Reviewed - No data to display  EKG: None  Radiology: CT PELVIS WO CONTRAST Result Date: 05/14/2024 CLINICAL DATA:  Fall. Right hip pain. Evaluate for occult fracture. EXAM: CT PELVIS WITHOUT CONTRAST TECHNIQUE: Multidetector CT imaging of the pelvis was performed following the standard protocol without intravenous contrast. RADIATION DOSE REDUCTION: This exam was performed according to the departmental dose-optimization  program which includes automated exposure control, adjustment of the mA and/or kV according to patient size and/or use of iterative reconstruction technique. COMPARISON:  None Available. FINDINGS: Lower Urinary Tract: Unremarkable urinary bladder. Bowel: Sigmoid diverticulosis, without signs of diverticulitis. Vascular/Lymphatic: No pathologically enlarged lymph nodes or other significant abnormality. Reproductive:  No mass or other significant abnormality. Other: Small fat-containing left inguinal hernia. Musculoskeletal: No evidence of acute fracture involving the hips or pelvis. Mild bilateral hip osteoarthritis noted. Lower lumbar spine degenerative changes also seen. IMPRESSION: No evidence of acute fracture or dislocation. Mild bilateral hip osteoarthritis. Sigmoid diverticulosis, without signs of diverticulitis. Small fat-containing left inguinal hernia. Electronically Signed   By: Norleen DELENA Kil M.D.   On: 05/14/2024 12:16   DG Sacrum/Coccyx Result Date: 05/14/2024 CLINICAL DATA:  Fall, right hip pain. EXAM: SACRUM AND COCCYX - 2+ VIEW COMPARISON:  None Available. FINDINGS: No definite sacrococcygeal fracture. Overlap of the lateral aspect of the right superior pubic ramus may be positional. Degenerative changes in the spine. IMPRESSION: 1. No definite sacrococcygeal fracture. 2. Overlap of the lateral aspect of the right superior pubic ramus and acetabulum may be positional. If there is concern for a fracture, dedicated views of the pelvis and right hip are recommended. Electronically Signed   By: Newell Eke M.D.   On: 05/14/2024 11:17     Procedures   Medications Ordered in the ED - No data to display                                  Medical Decision Making Amount and/or Complexity of Data Reviewed Radiology: ordered.  This patient presents to the ED with chief complaint(s) of fall.  The complaint involves an extensive differential diagnosis and also carries with it a high risk of  complications and morbidity.   Pertinent past medical history as listed in HPI  The differential diagnosis includes  Intracranial hemorrhage, TBI, fracture, dislocation, sprain Additional history obtained: Records reviewed Care Everywhere/External Records  Assessment and management:   Patient presents following mechanical fall yesterday.  Denied hitting his head or loss of consciousness.  He is without any gross neurodeficits or concerning symptoms to suggest intracranial hemorrhage.  Do not  feel that any CT head imaging is indicated today.  He has no midline spinal tenderness.  No radicular symptoms or urinary incontinence.  He is able to ambulate without difficulty.  He does have tenderness to the sacral region as well as the right lower lumbar paraspinal region without overlying ecchymosis or gross deformities.  Does have history of lumbar surgery.  Will start with sacral x-rays.  Offered something for pain.  He declines at this time.  X-ray negative.  CT ordered. CT negative for fracture or dislocation.  Patient is able to ambulate and will be discharged home.  Provided with Ortho follow-up should symptoms persist.  Independent ECG interpretation:  none  Independent labs interpretation:  The following labs were independently interpreted:  none  Independent visualization and interpretation of imaging: I independently visualized the following imaging with scope of interpretation limited to determining acute life threatening conditions related to emergency care:  Sacral x-ray no acute abnormality visualized CT pelvis no evidence of acute fracture or dislocation  Consultations obtained:   none  Disposition:   Patient will be discharged home. The patient has been appropriately medically screened and/or stabilized in the ED. I have low suspicion for any other emergent medical condition which would require further screening, evaluation or treatment in the ED or require inpatient management.  At time of discharge the patient is hemodynamically stable and in no acute distress. I have discussed work-up results and diagnosis with patient and answered all questions. Patient is agreeable with discharge plan. We discussed strict return precautions for returning to the emergency department and they verbalized understanding.     Social Determinants of Health:   none  This note was dictated with voice recognition software.  Despite best efforts at proofreading, errors may have occurred which can change the documentation meaning.       Final diagnoses:  Fall, initial encounter    ED Discharge Orders     None          Donnajean Lynwood VEAR DEVONNA 05/14/24 1249    Suzette Pac, MD 05/14/24 1714

## 2024-06-02 DIAGNOSIS — E7849 Other hyperlipidemia: Secondary | ICD-10-CM | POA: Diagnosis not present

## 2024-06-02 DIAGNOSIS — Z0000 Encounter for general adult medical examination without abnormal findings: Secondary | ICD-10-CM | POA: Diagnosis not present

## 2024-06-02 DIAGNOSIS — Z8673 Personal history of transient ischemic attack (TIA), and cerebral infarction without residual deficits: Secondary | ICD-10-CM | POA: Diagnosis not present

## 2024-06-02 DIAGNOSIS — I10 Essential (primary) hypertension: Secondary | ICD-10-CM | POA: Diagnosis not present

## 2024-06-02 DIAGNOSIS — K219 Gastro-esophageal reflux disease without esophagitis: Secondary | ICD-10-CM | POA: Diagnosis not present

## 2024-06-02 DIAGNOSIS — I25798 Atherosclerosis of other coronary artery bypass graft(s) with other forms of angina pectoris: Secondary | ICD-10-CM | POA: Diagnosis not present

## 2024-06-07 ENCOUNTER — Encounter (HOSPITAL_COMMUNITY): Payer: Self-pay | Admitting: Internal Medicine

## 2024-06-08 ENCOUNTER — Other Ambulatory Visit (HOSPITAL_COMMUNITY): Payer: Self-pay | Admitting: Internal Medicine

## 2024-06-08 DIAGNOSIS — Z8673 Personal history of transient ischemic attack (TIA), and cerebral infarction without residual deficits: Secondary | ICD-10-CM

## 2024-06-17 ENCOUNTER — Ambulatory Visit (HOSPITAL_COMMUNITY)
Admission: RE | Admit: 2024-06-17 | Discharge: 2024-06-17 | Disposition: A | Discharge status: Home / Self Care | Source: Ambulatory Visit | Attending: Internal Medicine | Admitting: Internal Medicine

## 2024-06-17 DIAGNOSIS — Z8673 Personal history of transient ischemic attack (TIA), and cerebral infarction without residual deficits: Secondary | ICD-10-CM | POA: Diagnosis not present

## 2024-06-17 DIAGNOSIS — I6523 Occlusion and stenosis of bilateral carotid arteries: Secondary | ICD-10-CM | POA: Diagnosis not present

## 2024-06-27 ENCOUNTER — Ambulatory Visit: Admitting: Orthopaedic Surgery

## 2024-06-27 ENCOUNTER — Encounter (INDEPENDENT_AMBULATORY_CARE_PROVIDER_SITE_OTHER): Payer: Medicare Other | Admitting: Ophthalmology

## 2024-06-27 DIAGNOSIS — H35372 Puckering of macula, left eye: Secondary | ICD-10-CM | POA: Diagnosis not present

## 2024-06-27 DIAGNOSIS — H353131 Nonexudative age-related macular degeneration, bilateral, early dry stage: Secondary | ICD-10-CM | POA: Diagnosis not present

## 2024-06-27 DIAGNOSIS — H43813 Vitreous degeneration, bilateral: Secondary | ICD-10-CM | POA: Diagnosis not present

## 2024-06-27 DIAGNOSIS — I10 Essential (primary) hypertension: Secondary | ICD-10-CM

## 2024-06-27 DIAGNOSIS — H35033 Hypertensive retinopathy, bilateral: Secondary | ICD-10-CM

## 2024-06-27 DIAGNOSIS — H338 Other retinal detachments: Secondary | ICD-10-CM | POA: Diagnosis not present

## 2024-06-29 ENCOUNTER — Ambulatory Visit: Admitting: Orthopaedic Surgery

## 2024-06-29 ENCOUNTER — Other Ambulatory Visit: Payer: Self-pay

## 2024-06-29 DIAGNOSIS — Z96651 Presence of right artificial knee joint: Secondary | ICD-10-CM | POA: Diagnosis not present

## 2024-06-29 NOTE — Progress Notes (Signed)
 The patient is a 73 year old gentleman who is now a year out from a right total knee arthroplasty to treat significant right knee pain and arthritis.  73 years old.  He reports good range of motion and strength but he still gets some stiffness from time if he is been sitting for too long.  Overall though he said he is doing well and has no significant complaints with his right knee replacement.  On exam his right knee range of motion is entirely full and there is no knee effusion.  The incision is healed nicely and the knee is ligamentously stable.  2 views of the right knee show well-seated press-fit total knee arthroplasty that is bony growth with no complicating features and no malalignment.  At this point follow-up for his right knee can be as needed.  If he does develop any issues he knows to let us  know.  All questions and concerns were addressed and answered.

## 2024-06-30 DIAGNOSIS — I10 Essential (primary) hypertension: Secondary | ICD-10-CM | POA: Diagnosis not present

## 2024-06-30 DIAGNOSIS — E7849 Other hyperlipidemia: Secondary | ICD-10-CM | POA: Diagnosis not present

## 2024-07-25 NOTE — Progress Notes (Unsigned)
 No chief complaint on file.  History of Present Illness: 73 yo male with history of HTN, HLD, prior CVA and CAD who is here today for cardiac follow up. He was admitted to [facility] November 2017 with an inferior STEMI. His sub-totally occluded RCA was treated with a bare metal stent. He was also found to have severe disease in the left main artery and Circumflex. He underwent 4V CABG (LIMA to LAD, SVG to Diagonal, SVG to OM, SVG to PDA) on 10/03/16 after he had completed 1 month of dual antiplatelet therapy post PCI/bare metal stent placement. He had stroke-like symptoms in January 2023 and was seen by Neurology. Brain MRI with small cortically based infarcts. He was seen by Vascular surgery following finding of right carotid artery stenosis. He underwent right carotid artery stenting in March 2023. Cardiac monitor March 2023 with sinus with PVCs. Echo May 2023 with LVEF=55-60%. Mild MR.   He is here today for follow up. The patient denies any chest pain, dyspnea, palpitations, lower extremity edema, orthopnea, PND, dizziness, near syncope or syncope.   Primary Care Physician: Orpha Yancey LABOR, MD  Past Medical History:  Diagnosis Date   Anginal pain    Arthritis    probably   BPH (benign prostatic hyperplasia)    Carotid artery occlusion    Coronary artery disease    Dyspnea    GERD (gastroesophageal reflux disease)    History of kidney stones    Hypertension    Kidney stones    Macular degeneration    Myocardial infarction [facility])    Nausea and vomiting 02/06/2016   Seasonal allergies     Past Surgical History:  Procedure Laterality Date   APPENDECTOMY     BACK SURGERY     CARDIAC CATHETERIZATION N/A 08/21/2016   Procedure: Left Heart Cath and Coronary Angiography;  Surgeon: Lonni JONETTA Cash, MD;  Location: [facility] INVASIVE CV LAB;  Service: Cardiovascular;  Laterality: N/A;   CARDIAC CATHETERIZATION N/A 08/21/2016   Procedure: Coronary Stent Intervention;  Surgeon: Lonni JONETTA Cash, MD;  Location: [facility] INVASIVE CV LAB;  Service: Cardiovascular;  Laterality: N/A;   CARDIAC SURGERY     quadruple bypass   CATARACT EXTRACTION W/ INTRAOCULAR LENS  IMPLANT, BILATERAL Bilateral    COLONOSCOPY     COLONOSCOPY WITH PROPOFOL  N/A 03/05/2021   Procedure: COLONOSCOPY WITH PROPOFOL ;  Surgeon: Eartha Angelia Sieving, MD;  [facility];  Service: Gastroenterology;  Laterality: N/A;  11:00   CORONARY ARTERY BYPASS GRAFT N/A 10/03/2016   Procedure: CORONARY ARTERY BYPASS GRAFTING (CABG)x 4 with endoscopic harvesting of right saphenous vein ,LIMA-LAD SVG-DIAG SVG-OM SVG-RCA;  Surgeon: Dorise MARLA Fellers, MD;  Location: [facility] OR;  Service: Open Heart Surgery;  Laterality: N/A;   EYE SURGERY     GAS INSERTION Right 02/05/2016   Procedure: INSERTION OF GAS-C3F8;  Surgeon: Norleen JONETTA Ku, MD;  Location: [facility] OR;  Service: Ophthalmology;  Laterality: Right;   HAND SURGERY Right    thumb; S/P cut his arm   LASER PHOTO ABLATION Right 02/05/2016   Procedure: LASER PHOTO ABLATION-HEADSCOPE LASER;  Surgeon: Norleen JONETTA Ku, MD;  Location: [facility] OR;  Service: Ophthalmology;  Laterality: Right;   LUMBAR DISC SURGERY  ~ 2002   POLYPECTOMY  03/05/2021   Procedure: POLYPECTOMY;  Surgeon: Eartha Angelia Sieving, MD;  [facility];  Service: Gastroenterology;;  ascending colon, sigmoid colon   RETINAL DETACHMENT REPAIR W/ SCLERAL BUCKLE LE Right 02/05/2016   SCLERAL BUCKLE Right  02/05/2016   Procedure: SCLERAL BUCKLE;  Surgeon: Norleen JONETTA Ku, MD;  Location: [facility] OR;  Service: Ophthalmology;  Laterality: Right;   TEE WITHOUT CARDIOVERSION N/A 10/03/2016   Procedure: TRANSESOPHAGEAL ECHOCARDIOGRAM (TEE);  Surgeon: Dorise MARLA Fellers, MD;  Location: [facility] OR;  Service: Open Heart Surgery;  Laterality: N/A;   TONSILLECTOMY     TOTAL KNEE ARTHROPLASTY Right 07/14/2023   Procedure: RIGHT TOTAL KNEE ARTHROPLASTY;  Surgeon: Vernetta Lonni GRADE, MD;  Location: [facility] OR;  Service: Orthopedics;   Laterality: Right;   TRANSCAROTID ARTERY REVASCULARIZATION  Right 12/19/2021   Procedure: Right Transcarotid Artery Revascularization;  Surgeon: Lanis Fonda BRAVO, MD;  Location: [facility] OR;  Service: Vascular;  Laterality: Right;    Current Outpatient Medications  Medication Sig Dispense Refill   aspirin  EC 81 MG tablet Take 81 mg by mouth daily. Swallow whole.     atorvastatin  (LIPITOR ) 40 MG tablet TAKE 1 TABLET BY MOUTH EVERY DAY 90 tablet 2   clopidogrel  (PLAVIX ) 75 MG tablet Take 75 mg by mouth daily.     loratadine  (CLARITIN ) 10 MG tablet Take 10 mg by mouth every evening.     methocarbamol  (ROBAXIN ) 500 MG tablet TAKE 1 TABLET BY MOUTH EVERY 6 HOURS AS NEEDED FOR MUSCLE SPASMS 30 tablet 1   metoprolol  tartrate (LOPRESSOR ) 25 MG tablet Take 1 tablet (25 mg total) by mouth 2 (two) times daily. 60 tablet 11   Multiple Vitamin (MULTIVITAMIN WITH MINERALS) TABS tablet Take 1 tablet by mouth daily.     Multiple Vitamins-Minerals (PRESERVISION AREDS 2 PO) Take 1 capsule by mouth in the morning and at bedtime.     Omega-3 Fatty Acids (FISH OIL) 1000 MG CAPS Take 1,000-2,000 mg by mouth See admin instructions. Take 2000 mg by mouth in the evening and 1000 mg at bedtime     pantoprazole  (PROTONIX ) 40 MG tablet Take 40 mg by mouth daily before breakfast.     Polyvinyl Alcohol -Povidone (REFRESH OP) Place 1 drop into both eyes in the morning.     promethazine  (PHENERGAN ) 12.5 MG tablet Take 1 tablet (12.5 mg total) by mouth every 8 (eight) hours as needed for nausea or vomiting. 30 tablet 0   ramipril  (ALTACE ) 5 MG capsule Take 5 mg by mouth in the morning.     tamsulosin  (FLOMAX ) 0.4 MG CAPS capsule Take 0.4 mg by mouth daily.     traMADol  (ULTRAM ) 50 MG tablet Take 1 tablet (50 mg total) by mouth every 6 (six) hours as needed. 5 tablet 1   No current facility-administered medications for this visit.    Allergies  Allergen Reactions   Hydrocodone     Percocet [Oxycodone -Acetaminophen ]       Social History   Socioeconomic History   Marital status: Married    Spouse name: Not on file   Number of children: Not on file   Years of education: 12   Highest education level: Not on file  Occupational History   Not on file  Tobacco Use   Smoking status: Former    Current packs/day: 0.00    Average packs/day: 3.0 packs/day for 16.0 years (48.0 ttl pk-yrs)    Types: Cigarettes    Start date: 02/04/1963    Quit date: 02/04/1979    Years since quitting: 45.5   Smokeless tobacco: Never  Vaping Use   Vaping status: Never Used  Substance and Sexual Activity   Alcohol  use: Not Currently    Comment: occassionally   Drug use: No   Sexual  activity: Not Currently  Other Topics Concern   Not on file  Social History Narrative   Lives at home with his wife.   Step-children.   Right-handed.   3 cups caffeine per day.   Social Drivers of Corporate investment banker Strain: Not on file  Food Insecurity: No Food Insecurity (07/14/2023)   Hunger Vital Sign    Worried About Running Out of Food in the Last Year: Never true    Ran Out of Food in the Last Year: Never true  Transportation Needs: No Transportation Needs (07/14/2023)   PRAPARE - Administrator, Civil Service (Medical): No    Lack of Transportation (Non-Medical): No  Physical Activity: Not on file  Stress: Not on file  Social Connections: Not on file  Intimate Partner Violence: Not At Risk (07/14/2023)   Humiliation, Afraid, Rape, and Kick questionnaire    Fear of Current or Ex-Partner: No    Emotionally Abused: No    Physically Abused: No    Sexually Abused: No    Family History  Problem Relation Age of Onset   Hypertension Mother    Congestive Heart Failure Father     Review of Systems:  As stated in the HPI and otherwise negative.   There were no vitals taken for this visit.  Physical Examination: General: Well developed, well nourished, NAD  HEENT: OP clear, mucus membranes moist  SKIN:  warm, dry. No rashes. Neuro: No focal deficits  Musculoskeletal: Muscle strength 5/5 all ext  Psychiatric: Mood and affect normal  Neck: No JVD, no carotid bruits, no thyromegaly, no lymphadenopathy.  Lungs:Clear bilaterally, no wheezes, rhonci, crackles Cardiovascular: Regular rate and rhythm. No murmurs, gallops or rubs. Abdomen:Soft. Bowel sounds present. Non-tender.  Extremities: No lower extremity edema. Pulses are 2 + in the bilateral DP/PT.  EKG:  EKG is  *** ordered today. The ekg ordered today demonstrates   Recent Labs: 12/10/2023: TSH 1.130   Lipid Panel Followed in primary care.    Wt Readings from Last 3 Encounters:  05/14/24 204 lb (92.5 kg)  12/10/23 208 lb 12.8 oz (94.7 kg)  11/26/23 207 lb 1.6 oz (93.9 kg)    Assessment and Plan:   1. CAD s/p CABG without angina: He had 4V CABG in December 2017. No chest pain. Continue ASA, Plavix , statin and beta blocker.     2. HTN: BP is controlled. Continue current therapy.    3. Hyperlipidemia: LDL ***. Continue statin.   4. Carotid artery disease: s/p right carotid artery stenting in March 2023. Stable carotid disease by dopplers September 2025. Continue DAPT with ASA and Plavix  per Vascular surgery recommendations.   Labs/ tests ordered today include:  No orders of the defined types were placed in this encounter.  Disposition:   F/U with me  in 12  months  Signed, Lonni Cash, MD 07/25/2024 9:08 PM    [facility] [address] [place], [place], [place]  [address] Phone: [phone]; Fax: [phone]

## 2024-07-26 ENCOUNTER — Encounter: Payer: Self-pay | Admitting: Cardiovascular Disease

## 2024-07-26 ENCOUNTER — Ambulatory Visit: Attending: Cardiovascular Disease | Admitting: Cardiovascular Disease

## 2024-07-26 VITALS — BP 112/60 | HR 63 | Ht 67.5 in | Wt 206.0 lb

## 2024-07-26 DIAGNOSIS — I25810 Atherosclerosis of coronary artery bypass graft(s) without angina pectoris: Secondary | ICD-10-CM

## 2024-07-26 DIAGNOSIS — E785 Hyperlipidemia, unspecified: Secondary | ICD-10-CM

## 2024-07-26 DIAGNOSIS — I6523 Occlusion and stenosis of bilateral carotid arteries: Secondary | ICD-10-CM | POA: Diagnosis not present

## 2024-07-26 DIAGNOSIS — I10 Essential (primary) hypertension: Secondary | ICD-10-CM | POA: Diagnosis not present

## 2024-07-26 NOTE — Patient Instructions (Signed)

## 2024-08-08 ENCOUNTER — Encounter: Payer: Self-pay | Admitting: Radiology

## 2024-08-30 ENCOUNTER — Encounter: Payer: Self-pay | Admitting: Neurology

## 2024-08-30 ENCOUNTER — Ambulatory Visit: Admitting: Neurology

## 2024-08-30 VITALS — BP 142/66 | HR 73 | Ht 67.0 in | Wt 207.8 lb

## 2024-08-30 DIAGNOSIS — I679 Cerebrovascular disease, unspecified: Secondary | ICD-10-CM | POA: Diagnosis not present

## 2024-08-30 DIAGNOSIS — I639 Cerebral infarction, unspecified: Secondary | ICD-10-CM

## 2024-08-30 DIAGNOSIS — Z8673 Personal history of transient ischemic attack (TIA), and cerebral infarction without residual deficits: Secondary | ICD-10-CM | POA: Diagnosis not present

## 2024-08-30 NOTE — Progress Notes (Signed)
 [name] [address] [place]. [place] [address] [phone]       OFFICE FOLLOW-UP VISIT NOTE  [name] Date of Birth:  May 10, 1951 Medical Record Number:  [id number]   Referring MD:  Fonda Simpers  Reason for Referral:  stroke and carotid stenosis  HPI: Initial visit 10/31/2021 Scott Jacobson is a 73 year old Caucasian male seen today for initial office consultation visit.  Is accompanied by his wife.  History is obtained from them and review of electronic medical records and reviewed pertinent available imaging films in PACS.  He has past medical history of hypertension, myocardial infarction, coronary artery disease, prostatic hypertrophy, arthritis and kidney stones.  Patient states on 10/01/2021 he developed sudden onset of numbness and his left face.  He states this involves mostly his cheeks and lips up to the midline.  It also involves his temples to mild degree but is not sure if it involves his forehead or eyelids.  He also noticed some ringing sound in his left ear.  He denied any facial weakness, slurred speech, extremity weakness or numbness.  A few days prior he had noticed some numbness in his left hand which she did not stay long.  He denies any focal extremity weakness, gait or balance problems, headache, loss of vision, double vision or vertigo.  He does have a remote history of TIA about 20 years ago when he had some speech difficulties for a few days.  He was seen in the ER and sent home with placed on aspirin .  He had carotid ultrasound done as an outpatient which showed 79 9% right ICA stenosis with heterogeneous calcified plaque as well as 50% left ICA stenosis.  He was seen by Dr. Simpers who is a vascular surgeon who felt his symptoms of left facial numbness did not go along with right carotid stenosis and sent the patient to me for my opinion.  Patient underwent CT angiogram of the brain and neck on 10/16/2021 which showed 60% right ICA stenosis and  50% left ICA stenosis.  Left P2 posterior cerebral artery was occluded with CT scan showing area of vague hypodensity in the left hippocampus possibly subacute infarct.  Patient denies any symptoms of peripheral vision loss, short-term memory, gait or balance.  He was on aspirin  and recently Plavix  has been added as tolerating both medications well without bruising or bleeding.  His blood pressure is under good control and today it is 142/68.  He has been started on Lipitor  40 mg which is tolerating well without muscle aches or pains.  He denies any definitive symptoms of right hemispheric ischemia but MRI scan of the brain done on 10/16/2021 shows no acute infarcts but shows subtle FLAIR and T2 parietal and occipital cortical hyperintensities possibly subacute infarcts more on the right than the left.  Patient denies any history of atrial fibrillation, palpitations, syncope or passing out. Update 08/05/2022 : He returns for follow-up after last visit 9 months ago.  Patient states he is doing well and has not had any stroke or TIA symptoms.  He remains on aspirin  and Plavix  which is tolerating well but does have easy bruising though no bleeding.  His blood pressure is well controlled at home but it is slightly elevated in my office today at 152/76.  He is very active and independent in activities of daily living and plays regularly.  He had carotid ultrasound done on 01/17/2022 and vascular surgery office which showed no significant restenosis in the right  carotid stent.  30-day heart monitoring from 11/07/2021 through 12/11/2021 showed no evidence of atrial fibrillation.  LDL cholesterol on 12/20/2021 was 46 mg percent.  2D echo on 02/25/2022 shows ejection fraction of 55 to 60% and mild mitral regurg.  Patient still has some residual numbness on his left face that is occasional facial twitching's which he is aware of do not seem to bother anybody else.SABRA Update 12/10/2023 : He returns for follow-up after last visit 4  months ago.  Patient states he is doing well and still has intermittent left facial numbness and tingling.  He has some good days and bad days.  He mentioned this to Dr. Silver vascular surgeon was concerned and advised him to see me for the same.  He states he did have recent lab work at primary care physician's office and it was satisfactory.  He had recent carotid ultrasound on 11/26/2023 which showed patent right carotid stent without restenosis and mild 40-59% left ICA stenosis.  He is complains of tremor in his hands which she actually had even before his stroke seems to be more active now and bothering him.  The tremor is absent at rest and is noticeable with activities like holding a plate in his hands of trying to feed himself.  He still feels tremor is not disabling and interfering with his activities of daily living.  He denies any family history of tremor and is not aware of any effect of alcohol  on the tremor. Update 08/30/2024 : He returns for follow-up after last visit 6 months ago.  He is accompanied by his wife.  He states he is doing well.  He has not had any recurrent stroke or TIA symptoms.  He remains on aspirin  and Plavix  (he has a cardiac stent.  He is tolerating them well with minor bruising and no lesions.  He states his blood pressure is well-controlled at home though it is elevated in the office today at 142/66.  Remains on Lipitor  systolic valvula muscle aches and pains.  Labs on 12/10/2023 showed LDL cholesterol to be optimal at 49 mg per hemoglobin A1c was 6.2.  Vitamin B12, TSH and homocystine were normal..  MRI scan of the brain on 01/16/2024 showed no acute abnormality.  Old bilateral parieto-occipital infarcts are noted as well as mild changes of small vessel disease atrophy.  He states he is doing well and has no new complaints today.  He states his hand tremor is much improved and not bothersome.  Facial numbness is also  negligible. ROS:   14 system review of systems is positive  for facial numbness, ringing in the ears, decreased hearing and all other systems negative  PMH:  Past Medical History:  Diagnosis Date   Anginal pain    Arthritis    probably   BPH (benign prostatic hyperplasia)    Carotid artery occlusion    Coronary artery disease    Dyspnea    GERD (gastroesophageal reflux disease)    History of kidney stones    Hypertension    Kidney stones    Macular degeneration    Myocardial infarction [facility])    Nausea and vomiting 02/06/2016   Seasonal allergies     Social History:  Social History   Socioeconomic History   Marital status: Married    Spouse name: Not on file   Number of children: Not on file   Years of education: 12   Highest education level: Not on file  Occupational History   Not  on file  Tobacco Use   Smoking status: Former    Current packs/day: 0.00    Average packs/day: 3.0 packs/day for 16.0 years (48.0 ttl pk-yrs)    Types: Cigarettes    Start date: 02/04/1963    Quit date: 02/04/1979    Years since quitting: 45.6   Smokeless tobacco: Never  Vaping Use   Vaping status: Never Used  Substance and Sexual Activity   Alcohol  use: Not Currently    Comment: occassionally   Drug use: No   Sexual activity: Not Currently  Other Topics Concern   Not on file  Social History Narrative   Lives at home with his wife.   Step-children.   Right-handed.   3 cups caffeine per day.   Social Drivers of Corporate Investment Banker Strain: Not on file  Food Insecurity: No Food Insecurity (07/14/2023)   Hunger Vital Sign    Worried About Running Out of Food in the Last Year: Never true    Ran Out of Food in the Last Year: Never true  Transportation Needs: No Transportation Needs (07/14/2023)   PRAPARE - Administrator, Civil Service (Medical): No    Lack of Transportation (Non-Medical): No  Physical Activity: Not on file  Stress: Not on file  Social Connections: Not on file  Intimate Partner Violence: Not At Risk  (07/14/2023)   Humiliation, Afraid, Rape, and Kick questionnaire    Fear of Current or Ex-Partner: No    Emotionally Abused: No    Physically Abused: No    Sexually Abused: No    Medications:   Current Outpatient Medications on File Prior to Visit  Medication Sig Dispense Refill   aspirin  EC 81 MG tablet Take 81 mg by mouth daily. Swallow whole.     atorvastatin  (LIPITOR ) 40 MG tablet TAKE 1 TABLET BY MOUTH EVERY DAY 90 tablet 2   clopidogrel  (PLAVIX ) 75 MG tablet Take 75 mg by mouth daily.     loratadine  (CLARITIN ) 10 MG tablet Take 10 mg by mouth every evening.     metoprolol  tartrate (LOPRESSOR ) 25 MG tablet Take 1 tablet (25 mg total) by mouth 2 (two) times daily. 60 tablet 11   Multiple Vitamin (MULTIVITAMIN WITH MINERALS) TABS tablet Take 1 tablet by mouth daily.     Multiple Vitamins-Minerals (PRESERVISION AREDS 2 PO) Take 1 capsule by mouth in the morning and at bedtime.     Omega-3 Fatty Acids (FISH OIL) 1000 MG CAPS Take 1,000-2,000 mg by mouth See admin instructions. Take 2000 mg by mouth in the evening and 1000 mg at bedtime     pantoprazole  (PROTONIX ) 40 MG tablet Take 40 mg by mouth daily before breakfast.     Polyvinyl Alcohol -Povidone (REFRESH OP) Place 1 drop into both eyes in the morning.     ramipril  (ALTACE ) 5 MG capsule Take 5 mg by mouth in the morning.     tamsulosin  (FLOMAX ) 0.4 MG CAPS capsule Take 0.4 mg by mouth daily.     No current facility-administered medications on file prior to visit.    Allergies:   Allergies  Allergen Reactions   Hydrocodone     Percocet [Oxycodone -Acetaminophen ]     Physical Exam General: well developed, well nourished elderly Caucasian male, seated, in no evident distress Head: head normocephalic and atraumatic.   Neck: supple with no carotid or supraclavicular bruits Cardiovascular: regular rate and rhythm, no murmurs Musculoskeletal: no deformity Skin:  no rash/petichiae Vascular:  Normal pulses all  extremities  Neurologic Exam Mental Status: Awake and fully alert. Oriented to place and time. Recent and remote memory intact. Attention span, concentration and fund of knowledge appropriate. Mood and affect appropriate.  Absent glabellar tap. Cranial Nerves: Fundoscopic exam not done. Pupils equal, briskly reactive to light. Extraocular movements full without nystagmus. Visual fields full to confrontation. Hearing slightly diminished on the left.  Air conduction patient is still greater than bone conduction.. Facial sensation Left lower face t. Face, tongue, palate moves normally and symmetrically.  Subjective left cheek numbness. Motor: Normal bulk and tone. Normal strength in all tested extremity muscles.  Occasional minor bruising from the left lower half of the face.  Sensory.: intact to touch , pinprick , position and vibratory sensation.  Coordination: Rapid alternating movements normal in all extremities. Finger-to-nose and heel-to-shin performed accurately bilaterally. Gait and Station: Arises from chair without difficulty. Stance is normal. Gait demonstrates normal stride length and balance . Able to heel, toe and tandem walk with mild difficulty.      Reflexes: 1+ and symmetric. Toes downgoing.       ASSESSMENT: 73 year old Caucasian male with sudden onset of left facial numbness and some hearing loss possibly from small brainstem infarct not visualized on MRI as it was done 2 weeks later likely small vessel disease but abnormal neurovascular studies suggesting moderate right carotid, mild left carotid and severe intracranial left PCA atherosclerotic disease.  Vascular risk factors of hyperlipidemia and hypertension and intra and extracranial atherosclerosis.  Abnormal MRI scan showing bilateral small cortical parieto-occipital silent infarcts.     PLAN: I had a long discussion with the patient and his wife regarding his remote stroke  and answered questions.   Continue aspirin  and   Plavix  for stroke prevention given no significant coronary artery disease and maintain aggressive risk factor modification with strict control of hypertension with blood pressure goal below 130/90, diabetes with hemoglobin A1c goal below 6.5% and lipids with LDL cholesterol goal below 70 mg/dL. I also advised the patient to eat a healthy diet with plenty of whole grains, cereals, fruits and vegetables, exercise regularly and maintain ideal body weight.  Follow-up in the future only as needed or call earlier if necessary.   I personally spent a total of 35 minutes in the care of the patient today including getting/reviewing separately obtained history, performing a medically appropriate exam/evaluation, counseling and educating, placing orders, referring and communicating with other health care professionals, documenting clinical information in the EHR, independently interpreting results, and coordinating care.        Eather Popp, MD  Note: This document was prepared with digital dictation and possible smart phrase technology. Any transcriptional errors that result from this process are unintentional. Tells me.  [phone]

## 2024-08-30 NOTE — Patient Instructions (Signed)
 I had a long discussion with the patient and his wife regarding his remote stroke  and answered questions.   Continue aspirin  and  Plavix  for stroke prevention given no significant coronary artery disease and maintain aggressive risk factor modification with strict control of hypertension with blood pressure goal below 130/90, diabetes with hemoglobin A1c goal below 6.5% and lipids with LDL cholesterol goal below 70 mg/dL. I also advised the patient to eat a healthy diet with plenty of whole grains, cereals, fruits and vegetables, exercise regularly and maintain ideal body weight.  Follow-up in the future only as needed or call earlier if necessary.

## 2024-12-15 ENCOUNTER — Ambulatory Visit: Admitting: Urology

## 2025-07-03 ENCOUNTER — Encounter (INDEPENDENT_AMBULATORY_CARE_PROVIDER_SITE_OTHER): Admitting: Ophthalmology
